# Patient Record
Sex: Female | Born: 1985 | Race: Asian | Hispanic: No | Marital: Single | Smoking: Never smoker
Health system: Southern US, Community
[De-identification: ages and names within clinical notes are randomized; demographics above are authoritative.]

## PROBLEM LIST (undated history)

## (undated) ENCOUNTER — Inpatient Hospital Stay (HOSPITAL_COMMUNITY): Payer: Self-pay

## (undated) ENCOUNTER — Inpatient Hospital Stay: Admission: EM | Payer: Self-pay | Source: Home / Self Care

## (undated) DIAGNOSIS — O0991 Supervision of high risk pregnancy, unspecified, first trimester: Secondary | ICD-10-CM

## (undated) DIAGNOSIS — A499 Bacterial infection, unspecified: Secondary | ICD-10-CM

## (undated) DIAGNOSIS — R0609 Other forms of dyspnea: Secondary | ICD-10-CM

## (undated) DIAGNOSIS — Z8619 Personal history of other infectious and parasitic diseases: Secondary | ICD-10-CM

## (undated) DIAGNOSIS — I340 Nonrheumatic mitral (valve) insufficiency: Secondary | ICD-10-CM

## (undated) DIAGNOSIS — I5020 Unspecified systolic (congestive) heart failure: Secondary | ICD-10-CM

## (undated) DIAGNOSIS — R5382 Chronic fatigue, unspecified: Secondary | ICD-10-CM

## (undated) DIAGNOSIS — R002 Palpitations: Secondary | ICD-10-CM

## (undated) DIAGNOSIS — I428 Other cardiomyopathies: Secondary | ICD-10-CM

## (undated) HISTORY — PX: WISDOM TOOTH EXTRACTION: SHX21

## (undated) HISTORY — DX: Personal history of other infectious and parasitic diseases: Z86.19

## (undated) HISTORY — DX: Unspecified systolic (congestive) heart failure: I50.20

## (undated) HISTORY — DX: Nonrheumatic mitral (valve) insufficiency: I34.0

## (undated) HISTORY — DX: Bacterial infection, unspecified: A49.9

## (undated) HISTORY — PX: DILATION AND CURETTAGE OF UTERUS: SHX78

---
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
## 2008-02-06 ENCOUNTER — Inpatient Hospital Stay (HOSPITAL_COMMUNITY): Admission: AD | Admit: 2008-02-06 | Discharge: 2008-02-06 | Payer: Self-pay | Admitting: Obstetrics and Gynecology

## 2008-02-10 ENCOUNTER — Encounter (INDEPENDENT_AMBULATORY_CARE_PROVIDER_SITE_OTHER): Payer: Self-pay | Admitting: Obstetrics and Gynecology

## 2008-02-10 ENCOUNTER — Inpatient Hospital Stay (HOSPITAL_COMMUNITY): Admission: AD | Admit: 2008-02-10 | Discharge: 2008-02-13 | Payer: Self-pay | Admitting: Obstetrics and Gynecology

## 2010-07-11 ENCOUNTER — Inpatient Hospital Stay (HOSPITAL_COMMUNITY)
Admission: AD | Admit: 2010-07-11 | Discharge: 2010-07-11 | Payer: Self-pay | Source: Home / Self Care | Attending: Obstetrics and Gynecology | Admitting: Obstetrics and Gynecology

## 2010-07-12 LAB — WET PREP, GENITAL

## 2010-07-12 LAB — CBC
Hemoglobin: 11.7 g/dL — ABNORMAL LOW (ref 12.0–15.0)
MCH: 25.3 pg — ABNORMAL LOW (ref 26.0–34.0)
MCV: 74.5 fL — ABNORMAL LOW (ref 78.0–100.0)
Platelets: 340 10*3/uL (ref 150–400)
RBC: 4.62 MIL/uL (ref 3.87–5.11)
RDW: 14.7 % (ref 11.5–15.5)

## 2010-07-13 ENCOUNTER — Ambulatory Visit (HOSPITAL_COMMUNITY)
Admission: RE | Admit: 2010-07-13 | Discharge: 2010-07-13 | Payer: Self-pay | Source: Home / Self Care | Attending: Obstetrics and Gynecology | Admitting: Obstetrics and Gynecology

## 2010-07-13 HISTORY — PX: DILATION AND EVACUATION: SHX1459

## 2010-07-13 LAB — CBC
MCH: 25.1 pg — ABNORMAL LOW (ref 26.0–34.0)
WBC: 5.9 10*3/uL (ref 4.0–10.5)

## 2010-07-21 NOTE — Op Note (Signed)
  NAME:  [name], EISENHOWER NO.:  [id number]  MEDICAL RECORD NO.:  [id number]          PATIENT TYPE:  AMB  LOCATION:  SDC                           [facility]  PHYSICIAN:  Hal Morales, M.D.DATE OF BIRTH:  26-Apr-1986  DATE OF PROCEDURE:  07/13/2010 DATE OF DISCHARGE:  07/13/2010                              OPERATIVE REPORT   PREOPERATIVE DIAGNOSIS:  Blighted ovum.  POSTOPERATIVE DIAGNOSIS:  Blighted ovum.  OPERATION:  Suction dilatation and evacuation.  SURGEON:  Hal Morales, MD  ANESTHESIA:  General mask.  ESTIMATED BLOOD LOSS:  Less than 10 mL.  COMPLICATIONS:  None.  FINDINGS:  The uterus was enlarged to approximately 8-week size and sounded to 8 cm.  There were a moderate amount of products of conception obtained at D and E.  PROCEDURE:  The patient was taken to the operating room after appropriate identification and placed on the operating table.  After the attainment of adequate anesthesia, she was placed in the lithotomy position.  Perineum and vagina were prepped with multiple layers of Betadine and draped as a sterile field.  The bladder was emptied with a red Robinson catheter.  The Graves speculum was placed in the vagina and a paracervical block achieved with a total of 10 mL of 2% Xylocaine in the 5 and 7 o'clock positions.  The cervix was grasped with a single- tooth tenaculum on the anterior lip and the uterus sounded.  The cervix was dilated to accommodate a #7 suction curette and that was used to suction evacuate all quadrants of the uterus.  A sharp curette was used to ensure that all products of conception had been removed.  The instruments were then removed from the vagina and the patient was given a single dose of Methergine 0.2 mg IV then Toradol 30 mg IV and 30 mg IM.  She was awakened from general anesthesia and taken to the recovery room in satisfactory condition having tolerated the procedure well with sponge and  instrument counts correct.  SPECIMENS TO PATHOLOGY:  Products of conception.  Blood type B positive.  DISCHARGE INSTRUCTIONS:  Printed instructions for D and E from the [facility].  DISCHARGE MEDICATIONS: 1. Ibuprofen 600 mg p.o. q.6 h. for the next 3 days and then p.r.n.     pain. 2. Doxycycline 100 mg p.o. b.i.d. for 7 days. 3. Methergine 0.2 mg p.o. q.6 h. for 2 days.  FOLLOWUP:  In 2 weeks.     Hal Morales, M.D.     VPH/MEDQ  D:  07/13/2010  T:  07/14/2010  Job:  [id number]  Electronically Signed by Dierdre Forth M.D. on 07/21/2010 11:39:30 AM

## 2010-07-21 NOTE — H&P (Signed)
  NAME:  ONI, DIETZMAN NO.:  [id number]  MEDICAL RECORD NO.:  [id number]          PATIENT TYPE:  AMB  LOCATION:  SDC                           [facility]  PHYSICIAN:  Hal Morales, M.D.DATE OF BIRTH:  18-Nov-1985  DATE OF ADMISSION:  07/13/2010 DATE OF DISCHARGE:                             HISTORY & PHYSICAL   The patient is a 25 year old gravida 2, para 1-0-0-1 at 10 weeks and 3 days by last menstrual period April 29, 2010 who was seen in maternity admissions on July 11, 2010 for spotting and mild cramping. She was diagnosed with a blighted ovum by ultrasound at that visit.  The ultrasound showed a gestational sac measuring 6 weeks and 5 days with no fetal pole, no yolk sac. The gestational sac was in the lower uterine segment.  She was informed that this was not a viable pregnancy and since she was in stable condition, she was given the options of expectant management versus Cytotec versus D and C and initially elected expectant management with plan to follow up in the office in a week. The following day she called and asked to proceed with a D&C which was scheduled with Dr. Pennie Rushing on July 13, 2010.  PAST MEDICAL HISTORY:  Positive for frequent UTIs and chickenpox as a child.  OBSTETRICAL HISTORY:  The patient had elective abortion in 2003.  She had a D&E with no complications.  In 2009, she had a primary low- transverse C-section for failure to progress.  She had elevated blood pressures in that pregnancy.  GYN HISTORY:  The patient started menstruating around 50-51 years of age.  She has regular cycles with no abnormalities.  The first day of her last menstrual period was April 29, 2010 giving her an estimated date of delivery of February 03, 2011.  SURGICAL HISTORY:  Positive for D&E in 2003 and low transverse primary C- section in 2009.  FAMILY HISTORY:  No significant family history.  GENETIC HISTORY:  Positive for Down  syndrome.  SOCIAL HISTORY:  The patient is a single [place] female.  She denies tobacco, alcohol, and drug use.  ALLERGIES:  No known drug allergies.  No allergy to latex or iodine.  MEDICATIONS:  None.  REVIEW OF SYSTEMS:  Negative except as stated in history of present illness.  ASSESSMENT:  Blighted ovum and desires surgical intervention.  PLAN:  D and E scheduled with Dierdre Forth on July 13, 2010.     Dorathy Kinsman, CNM   ______________________________ Hal Morales, M.D.    VS/MEDQ  D:  07/13/2010  T:  07/13/2010  Job:  [id number]  Electronically Signed by Dorathy Kinsman  on 07/16/2010 04:39:50 PM Electronically Signed by Dierdre Forth M.D. on 07/21/2010 11:39:25 AM

## 2010-10-30 ENCOUNTER — Emergency Department (HOSPITAL_BASED_OUTPATIENT_CLINIC_OR_DEPARTMENT_OTHER)
Admission: EM | Admit: 2010-10-30 | Discharge: 2010-10-30 | Disposition: A | Discharge status: Home / Self Care | Payer: Medicaid Other | Attending: Emergency Medicine | Admitting: Emergency Medicine

## 2010-10-30 DIAGNOSIS — M545 Low back pain, unspecified: Secondary | ICD-10-CM | POA: Insufficient documentation

## 2010-10-30 LAB — URINALYSIS, ROUTINE W REFLEX MICROSCOPIC
Ketones, ur: NEGATIVE mg/dL
Nitrite: NEGATIVE

## 2010-10-30 LAB — URINE MICROSCOPIC-ADD ON

## 2010-10-31 NOTE — Discharge Summary (Signed)
NAME:  [name], KARDELL NO.:  [id number]   MEDICAL RECORD NO.:  [id number]          PATIENT TYPE:  INP   LOCATION:  9108                          [facility]  [facility]   PHYSICIAN:  Hal Morales, M.D.DATE OF BIRTH:  [birth year]   DATE OF ADMISSION:  02/10/2008  DATE OF DISCHARGE:  02/13/2008                               DISCHARGE SUMMARY   DISCHARGING PHYSICIAN:  Dois Davenport A. Rivard, MD   ADMISSION DIAGNOSES:  1. Intrauterine pregnancy at 38-5/7 weeks.  2. Elevated blood pressures.  3. Early labor.  4. Spontaneous rupture of membranes.  5. Group B Streptococcus negative.   DISCHARGE DIAGNOSES:  1. Intrauterine pregnancy at 38-5/7 weeks.  2. Elevated blood pressures.  3. Early labor.  4. Spontaneous rupture of membranes.  5. Group B Streptococcus negative.  6. Failure to descend.  7. Pregnancy-induced hypertension.  8. Status post cesarean delivery of a viable female infant with Apgars      9 and 9 weighing 5 pounds 11 ounces.   HOSPITAL PROCEDURES:  1. Electronic fetal monitoring.  2. Epidural anesthesia.  3. Primary low-transverse cesarean delivery of a female infant, Apgars      9 and 9, weighing 5 pounds 11 ounces.   HOSPITAL COURSE:  The patient was admitted in early labor with  spontaneous rupture of membranes.  She was observed through the day and  gradually increased her labor and progressed to 8 cm rather quickly.  She quickly became completely dilated after that and began pushing.  She  pushed for a total of 4 hours with a 1-hour arrest in between.  The  fetal vertex failed to descend past the 0 station.  She was counseled by  Dr. Su Hilt that she would not be a candidate for vacuum-assisted  delivery, and recommendation was made for primary low-transverse  cesarean section, which was performed under epidural anesthesia for a  viable female infant, Apgars 9 and 9, weighing 5 pounds 11 ounces.  She  was taken to the AICU because of elevations in her  liver function tests.  The next morning, it was determined based on these significant  elevations that she was pre-eclamptic and was placed on magnesium  sulfate infusion.  On postop day #1, her blood pressures were 130s to  150s over 80s to 90s.  ALT was 115, which was elevated from 44.  LDH was  697.  She was given routine care as per magnesium sulfate protocol, and  on postop day #1, was running blood pressures in the 120s to 140s over  70s.  She continued to receive routine care, and liver enzymes were  rechecked and found to be decreased on postop day #2 from an AST of 337  down to 204, ALT decreased from 147 to 127, platelets remained stable at  254, blood pressures were in the 130s over 80s to low 90s, I&O was  equal, oxygen saturations were good.  She continued to receive routine  care, and magnesium sulfate was discontinued.  She was then transferred  to the Mother-Baby Unit, where she did well.  She continued to breast  pump and  feed the baby breast milk via bottle.  On postop day #3, she  was ready to go home.  She was up and around the room, not requiring  pain medications.  Breast pumping was going well.  Vital signs were  stable.  Blood pressures were 112 to 137 over 54 to 97.  Weight was  stable.  Chest was clear.  Heart rate regular rate and rhythm.  Abdomen  was soft and appropriately tender.  Incision was clean, dry, and intact.  Extremities showed trace to 1+ edema with 2+ DTRs and no clonus.  Platelet count was 255, AST was 132, which was decreased, and ALT was  stable 127.  Blood pressure just prior to discharge was 131/82.  She was  deemed to receive full benefit of her hospital stay and was discharged  home per Dr. Estanislado Pandy.   DISCHARGE MEDICATIONS:  1. Motrin 600 mg p.o. q.6 h. p.r.n.  2. Tylox 1-2 p.o. q.4 h. p.r.n.   DISCHARGE LABORATORIES:  Sodium 137, potassium 4.3, creatinine 0.46, AST  132, ALT 127, platelet count 255, hemoglobin 9.1.   DISCHARGE  INSTRUCTIONS:  Per CCP handout.   DISCHARGE FOLLOWUP:  Will include a Smart Start visit on Monday with a  visit at [facility] on February 20, 2008 at 9:30 a.m. for  blood pressure check.   CONDITION AT DISCHARGE:  Good.      Marie L. Mayford Knife, C.N.M.      ______________________________  Hal Morales, M.D.    MLW/MEDQ  D:  02/13/2008  T:  02/14/2008  Job:  [id number]

## 2010-10-31 NOTE — Op Note (Signed)
NAME:  [name], [name] NO.:  [id number]   MEDICAL RECORD NO.:  [id number]          PATIENT TYPE:  INP   LOCATION:  9168                          [facility]   PHYSICIAN:  Osborn Coho, M.D.   DATE OF BIRTH:  Aug 07, 1985   DATE OF PROCEDURE:  02/10/2008  DATE OF DISCHARGE:                               OPERATIVE REPORT   PREOPERATIVE DIAGNOSES:  1. Term intrauterine pregnancy.  2. Labor.  3. Failure to descend.   POSTOPERATIVE DIAGNOSES:  1. Term intrauterine pregnancy.  2. Labor.  3. Failure to descend.  4. Questionable pregnancy-induced hypertension.   PROCEDURE:  Primary low transverse C-section.   ANESTHESIA:  Epidural.   ATTENDING:  Kae Heller, MD   ASSISTANT:  1. Wynelle Bourgeois, CNM.  2. Candice Denny Levy, CNM.   FLUIDS:  2000 mL.   URINE OUTPUT:  125 mL.   ESTIMATED BLOOD LOSS:  600 mL.   COMPLICATIONS:  None.   FINDINGS:  Live female infant with Apgars of 9 at 1 minute and 9 at 5  minutes, weighing 5 pounds and 11 ounces.  Normal-appearing bilateral  ovaries and fallopian tubes.   PROCEDURE:  The patient was taken to the operating room after risks,  benefits, and alternatives discussed with the patient.  The patient  verbalized understanding, and consent signed and witness.  The patient  was given a surgical level via the epidural, and prepped and draped in  the normal sterile fashion in the supine position.  A Pfannenstiel skin  incision was made and carried down to the underlying layer of fascia  with the scalpel and Bovie.  The fascia was excised bilaterally in the  midline, and extended bilaterally with the Mayo scissors.  Kocher clamps  were placed on the inferior aspect of the fascial incision, and the  rectus muscle excised from the fascia.  The same was done on the  superior aspect of the fascial incision.   The muscle was separated in the midline, and the peritoneum entered  bluntly and extended manually.  The  bladder blade was placed, and  bladder flap created with the Metzenbaum scissors.  Bladder blade was  replaced, uterine incision made with a scalpel, and extended bilaterally  with the bandage scissors.  The infant was in the ROP presentation,  delivered without difficulty, and oropharynx was bulb suctioned.  The  cord was clamped and cut, and the infant handed to the waiting  pediatricians.  Cefoxitin 1 g had been administered at the start of the  case.  The placenta was removed via fundal massage, and uterus cleared  of all clots and debris.   The uterine incision was repaired with 0 Vicryl in a running-locked  fashion, and a second imbricating layer was performed.  Prior to closure  of the primary incision, there was a uterine extension of the incision  on the right aspect of the incision.  This was repaired with 0 Vicryl  via a running interlocking stitch, and a second layer was performed  using an imbricated stitch of 0 Vicryl as well.  The intra-abdominal  cavity was  copiously irrigated, and normal-appearing bilateral ovaries  and fallopian tubes were noted.  The uterine incisions were noted to be  hemostatic, and the peritoneum was repaired with 2-0 chromic in a  running fashion.  The fascia was repaired with 0 Vicryl in a running  fashion.  The subcutaneous tissue was irrigated and made hemostatic with  the Bovie.   Four interrupted stitches of 2-0 plain were placed in the subcutaneous  tissue, and the skin was reapproximated using 3-0 Monocryl via a  subcuticular stitch.  A 1/2-inch sterile Steri-Strips were applied with  Benzoin.  Sponge, lap, and needle count was correct.  The patient  tolerated procedure well, and is currently being transferred to the  recovery room in good condition.      Osborn Coho, M.D.  Electronically Signed     AR/MEDQ  D:  02/10/2008  T:  02/11/2008  Job:  [id number]

## 2010-10-31 NOTE — H&P (Signed)
NAME:  [name], VERDEJO NO.:  [id number]   MEDICAL RECORD NO.:  [id number]          PATIENT TYPE:  INP   LOCATION:  9163                          [facility]   PHYSICIAN:  Hal Morales, M.D.DATE OF BIRTH:  December 09, 1985   DATE OF ADMISSION:  02/10/2008  DATE OF DISCHARGE:                              HISTORY & PHYSICAL   HISTORY OF PRESENT ILLNESS:  The patient is a 25 year old single Asian  female gravida 2, para 0-0-1-0 at 38-5/7 weeks per an [facility] of February 19, 2008, who presents with chief complaint of spontaneous rupture of  membranes at 0250 a.m. in an early labor.  She denies headache, visual  disturbances, right upper quadrant or epigastric pain, UTI signs or  symptoms, nausea, vomiting, diarrhea, fever, shortness of breath, cough  or chills.  She reports contractions ensuing soon after rupture of  membrane.  She has been followed by the CNM service at [facility].  History is  unremarkable.  GBS is negative.  She reports good fetal movement.  Leakage of fluid has been clear.   PRENATAL LABORATORIES:  Patient's blood type is B positive, Rh antibody  screen negative, sickle cell negative.  RPR nonreactive, rubella titer  immune, hepatitis surface antigen negative, HIV nonreactive, cystic  fibrosis negative.  Pap smear on March 5 was normal.  Gonorrhea and  chlamydia cultures were negative.  As well, on that date, her hemoglobin  was 12.3, hematocrit 36.4, platelets 373.  GBS and GC/chlamydia cultures  at 35-3/7 weeks were all negative.   OBSTETRICAL HISTORY:  The patient had an elective abortion with  subsequent D&E around 2003.  Gravida 2 is current pregnancy.   ALLERGIES:  Denies medication of latex allergies or other sensitivities.   PAST MEDICAL HISTORY:  She reports menarche at age 8-16.  Monthly  cycle.  On abnormalities.  She reported an LMP of May 08, 2007,  which originally gave her an [facility] of February 12, 2008.  However, she had  early  ultrasound giving best EDC of February 19, 2008.  She reports use  of birth control pills around 2003.  She thinks that she did not have  chicken pox as a child, and otherwise, normal childhood illnesses.  Did  report a history of frequent UTI's, last one was in November 2008.   PAST SURGICAL HISTORY:  Unremarkable.   FAMILY HISTORY:  Unremarkable.   GENETIC HISTORY:  Father of the baby's older brother has Down syndrome.   SOCIAL HISTORY:  She is a single [place] female.  They do not report a  religious affiliation.  Father of the baby's name is Denton Brick.  He  works full time as a Radio broadcast assistant and is involved and supportive.  The  patient is unemployed and had been a full time Consulting civil engineer at [employer], but has  not attended since fall 2008.  She denied alcohol, tobacco or illicit  drug use.   HISTORY OF PRESENT PREGNANCY:  She entered care at [facility] for new OB  interview February 23.  She had questionable LMP, and was scheduled when  she returned for a dating  ultrasound.  She returned at approximately 14  weeks.  On March 5, had an ultrasound showing single intrauterine  pregnancy, size less than dates, giving best [facility] of February 19, 2008,  anterior placenta.  She did have a quad screen done at that time and it  was within normal limits.  She returned at approximately 18-4/7 weeks on  April 6 and had her anatomy ultrasound, size was equal to dates.  Cervical length 3.31 cm, anterior placenta, three vessel cord, normal  anatomy and growth and development.  At around 23-3/7 weeks, she was  complaining of a fine papular rash on her abdomen and was instructed to  try Claritin p.r.n. for several days and avoid any perfumed soaps and  lotions.  Has not had any problems with the rash since.  At 27-4/7  weeks, she had her one hour Glucola which was within normal limits,  equal to 62.  Hemoglobin at that time was 10.8.  Pregnancy continued to  progress without any complications until her presentation  today.  She  has had trace protein on urine on several occasions at the office as  well as most recently on labor check here.   OBJECTIVE:  VITAL SIGNS:  On admission, blood pressure 141/102, repeat  was 143/104, heart rate 99, respirations 20, temperature 97.7.  EFM  showing fetal heart rate 135.  She has had some 10/10 accelerations, but  not yet reactive.  Minimal to moderate variability.  No decelerations.  Toco showing uterine contractions every 3-5 minutes with interspersed  irritability.  Moderate to strong on palpation.  GENERAL:  She has a labored breathing with her contractions and grimace.  Otherwise, she is without pain.  She is alert and oriented x3 and very  pleasant.  HEENT:  Grossly intact and within normal limits.  CARDIOVASCULAR:  Regular rate and rhythm without murmur.  LUNGS:  Clear to auscultation bilaterally.  ABDOMEN:  Soft, nontender, gravid.  PELVIC:  She did have a clear glistening fluid on vulva and perineum.  __________was positive.  Cervical exam was 3.5, 100% and 0 station.  No  membranes were palpated.  EXTREMITIES:  Deep tendon reflexes were 1+.  She does not have any  clonus.  She has trace bilateral lower extremity edema.   IMPRESSION:  1. Intrauterine pregnancy at 38-5/7 weeks.  2. Elevated blood pressures .  3. Early labor with spontaneous rupture of membranes.  4. Group beta strep negative .  5. Undecided on pain intervention during labor.   PLAN:  1. Admit to birthing suite with Dr. Pennie Rushing as attending physician.  2. Routine L&D orders with [facility] labs with saline lock.  Will defer cath      UA at present.  3. Encouraged patient to receive Stadol or epidural  p.r.n. secondary      to blood pressure elevation.  4. Support p.r.n. while awaiting for the baby's arrival.  He had been      in [place].  5. Consult with MD p.r.n.      Candice Denny Levy, CNM      ______________________________  Hal Morales, M.D.    CHS/MEDQ   D:  02/10/2008  T:  02/10/2008  Job:  [id number]

## 2011-08-16 ENCOUNTER — Emergency Department (HOSPITAL_BASED_OUTPATIENT_CLINIC_OR_DEPARTMENT_OTHER)
Admission: EM | Admit: 2011-08-16 | Discharge: 2011-08-16 | Disposition: A | Discharge status: Home / Self Care | Payer: Self-pay | Attending: Emergency Medicine | Admitting: Emergency Medicine

## 2011-08-16 ENCOUNTER — Encounter (HOSPITAL_BASED_OUTPATIENT_CLINIC_OR_DEPARTMENT_OTHER): Payer: Self-pay

## 2011-08-16 DIAGNOSIS — B309 Viral conjunctivitis, unspecified: Secondary | ICD-10-CM | POA: Insufficient documentation

## 2011-08-16 DIAGNOSIS — H5710 Ocular pain, unspecified eye: Secondary | ICD-10-CM | POA: Insufficient documentation

## 2011-08-16 MED ORDER — TETRACAINE HCL 0.5 % OP SOLN
OPHTHALMIC | Status: AC
  Administered 2011-08-16: 16:00:00
  Filled 2011-08-16: qty 2

## 2011-08-16 MED ORDER — FLUORESCEIN SODIUM 1 MG OP STRP
ORAL_STRIP | OPHTHALMIC | Status: AC
  Administered 2011-08-16: 16:00:00
  Filled 2011-08-16: qty 1

## 2011-08-16 NOTE — Discharge Instructions (Signed)
Viral Conjunctivitis Conjunctivitis is an irritation (inflammation) of the clear membrane that covers the white part of the eye (the conjunctiva). The irritation can also happen on the underside of the eyelids. Conjunctivitis makes the eye red or pink in color. This is what is commonly known as pink eye. Viral conjunctivitis can spread easily (contagious). CAUSES   Infection from virus on the surface of the eye.   Infection from the irritation or injury of nearby tissues such as the eyelids or cornea.   More serious inflammation or infection on the inside of the eye.   Other eye diseases.   The use of certain eye medications.  SYMPTOMS  The normally white color of the eye or the underside of the eyelid is usually pink or red in color. The pink eye is usually associated with irritation, tearing and some sensitivity to light. Viral conjunctivitis is often associated with a clear, watery discharge. If a discharge is present, there may also be some blurred vision in the affected eye. DIAGNOSIS  Conjunctivitis is diagnosed by an eye exam. The eye specialist looks for changes in the surface tissues of the eye which take on changes characteristic of the specific types of conjunctivitis. A sample of any discharge may be collected on a Q-Tip (sterile swap). The sample will be sent to a lab to see whether or not the inflammation is caused by bacterial or viral infection. TREATMENT  Viral conjunctivitis will not respond to medicines that kill germs (antibiotics). Treatment is aimed at stopping a bacterial infection on top of the viral infection. The goal of treatment is to relieve symptoms (such as itching) with antihistamine drops or other eye medications.  HOME CARE INSTRUCTIONS   To ease discomfort, apply a cool, clean wash cloth to your eye for 10 to 20 minutes, 3 to 4 times a day.   Gently wipe away any drainage from the eye with a warm, wet washcloth or a cotton ball.   Wash your hands often with  soap and use paper towels to dry.   Do not share towels or washcloths. This may spread the infection.   Change or wash your pillowcase every day.   You should not use eye make-up until the infection is gone.   Stop using contacts lenses. Ask your eye professional how to sterilize or replace them before using again. This depends on the type of contact lenses used.   Do not touch the edge of the eyelid with the eye drop bottle or ointment tube when applying medications to the affected eye. This will stop you from spreading the infection to the other eye or to others.  SEEK IMMEDIATE MEDICAL CARE IF:   The infection has not improved within 3 days of beginning treatment.   A watery discharge from the eye develops.   Pain in the eye increases.   The redness is spreading.   Vision becomes blurred.   An oral temperature above 102 F (38.9 C) develops, or as your caregiver suggests.   Facial pain, redness or swelling develops.   Any problems that may be related to the prescribed medicine develop.  MAKE SURE YOU:   Understand these instructions.   Will watch your condition.   Will get help right away if you are not doing well or get worse.  Document Released: 06/04/2005 Document Revised: 02/14/2011 Document Reviewed: 01/22/2008 Exit[employer] Patient Information 2012 [employer]. 

## 2011-08-16 NOTE — ED Provider Notes (Signed)
History     CSN: [id number]  Arrival date & time 08/16/11  1545   First MD Initiated Contact with Patient 08/16/11 1604      Chief Complaint  Patient presents with  . Eye Pain    (Consider location/radiation/quality/duration/timing/severity/associated sxs/prior treatment) HPI Comments: Patient presents with mild left lower eyelid swelling since yesterday.  There is a mild amount of redness and patient notes some mild irritation but no significant discharge.  No significant changes in vision.  Patient does not wear contacts or glasses.  She has no known injuries to her eye.  Patient is a 26 y.o. female presenting with eye pain. The history is provided by the patient.  Eye Pain This is a new problem. The current episode started yesterday. The problem occurs constantly. The problem has not changed since onset.Pertinent negatives include no chest pain, no abdominal pain, no headaches and no shortness of breath. The symptoms are aggravated by nothing. The symptoms are relieved by nothing. She has tried nothing for the symptoms.    History reviewed. No pertinent past medical history.  Past Surgical History  Procedure Date  . Cesarean section     History reviewed. No pertinent family history.  History  Substance Use Topics  . Smoking status: Never Smoker   . Smokeless tobacco: Not on file  . Alcohol Use: Yes    OB History    Grav Para Term Preterm Abortions TAB SAB Ect Mult Living                  Review of Systems  Constitutional: Negative.  Negative for fever and chills.  HENT: Negative.   Eyes: Positive for pain and redness. Negative for discharge.  Respiratory: Negative.  Negative for cough and shortness of breath.   Cardiovascular: Negative.  Negative for chest pain.  Gastrointestinal: Negative.  Negative for nausea, vomiting, abdominal pain and diarrhea.  Genitourinary: Negative.  Negative for dysuria and vaginal discharge.  Musculoskeletal: Negative.  Negative for  back pain.  Skin: Negative.  Negative for color change and rash.  Neurological: Negative.  Negative for syncope and headaches.  Hematological: Negative.  Negative for adenopathy.  Psychiatric/Behavioral: Negative.  Negative for confusion.  All other systems reviewed and are negative.    Allergies  Review of patient's allergies indicates no known allergies.  Home Medications  No current outpatient prescriptions on file.  BP 126/62  Pulse 87  Temp(Src) 98.1 F (36.7 C) (Oral)  Resp 18  Ht 5\' 4"  (1.626 m)  Wt 112 lb (50.803 kg)  BMI 19.22 kg/m2  SpO2 98%  LMP 07/23/2011  Physical Exam  Nursing note and vitals reviewed. Constitutional: She is oriented to person, place, and time. She appears well-developed and well-nourished.  Non-toxic appearance. She does not have a sickly appearance.  HENT:  Head: Normocephalic and atraumatic.  Eyes: EOM and lids are normal. Pupils are equal, round, and reactive to light. Right eye exhibits no discharge. Left eye exhibits no discharge. No scleral icterus.       Pupils are equal round and reactive to light, extraocular eye movements are intact.  Patient does have mild swelling and erythema to left lower eyelid.  No discharge is present.  The patients I did have fluorescein stain placed and no corneal abrasions were noted.  Neck: Trachea normal and normal range of motion. Neck supple.  Cardiovascular: Normal rate.   Pulmonary/Chest: Effort normal.  Abdominal: Normal appearance. There is no CVA tenderness.  Musculoskeletal: Normal range of motion.  Neurological: She is alert and oriented to person, place, and time. She has normal strength.  Skin: Skin is warm, dry and intact. No rash noted.  Psychiatric: She has a normal mood and affect. Her behavior is normal. Judgment and thought content normal.    ED Course  Procedures (including critical care time)  Labs Reviewed - No data to display No results found.   No diagnosis found.    MDM    Patient with likely viral conjunctivitis based on her exam.  There's no signs of corneal abrasion under fluorescein staining.  Patient has no complaints of changes in vision.  She has been given instructions that if the redness is worsening her pain is worsening or she has changes in her vision she should seek reevaluation.        Nat Christen, MD 08/16/11 [phone]

## 2011-08-16 NOTE — ED Notes (Signed)
Pt c/o L eye pain onset last night.  Pt denies using any meds PTA for pain.

## 2012-02-25 ENCOUNTER — Telehealth: Payer: Self-pay | Admitting: Obstetrics and Gynecology

## 2012-02-25 ENCOUNTER — Ambulatory Visit (INDEPENDENT_AMBULATORY_CARE_PROVIDER_SITE_OTHER): Payer: Medicaid Other

## 2012-02-25 ENCOUNTER — Other Ambulatory Visit: Payer: Self-pay | Admitting: Obstetrics and Gynecology

## 2012-02-25 ENCOUNTER — Encounter: Payer: Self-pay | Admitting: Obstetrics and Gynecology

## 2012-02-25 ENCOUNTER — Ambulatory Visit (INDEPENDENT_AMBULATORY_CARE_PROVIDER_SITE_OTHER): Payer: Medicaid Other | Admitting: Obstetrics and Gynecology

## 2012-02-25 VITALS — BP 98/56 | Ht 64.0 in | Wt 112.0 lb

## 2012-02-25 DIAGNOSIS — O209 Hemorrhage in early pregnancy, unspecified: Secondary | ICD-10-CM

## 2012-02-25 DIAGNOSIS — N898 Other specified noninflammatory disorders of vagina: Secondary | ICD-10-CM

## 2012-02-25 DIAGNOSIS — Z331 Pregnant state, incidental: Secondary | ICD-10-CM

## 2012-02-25 LAB — POCT URINALYSIS DIPSTICK
Bilirubin, UA: NEGATIVE
Glucose, UA: NEGATIVE
Ketones, UA: NEGATIVE
Leukocytes, UA: NEGATIVE
Nitrite, UA: NEGATIVE
Protein, UA: NEGATIVE
Urobilinogen, UA: NEGATIVE

## 2012-02-25 LAB — US OB COMP LESS 14 WKS

## 2012-02-25 NOTE — Progress Notes (Signed)
Subjective:    [name] is a 26 y.o. female, G3P0011, who presents for vaginal bleeding. Pt states that she is [redacted] weeks pregnant and started having bleeding on Saturday. States it started as spotting and by the end of the day it was as heavy as a cycle. Has not had any bleeding since Saturday. States that she has had some cramping. Pt states that she did have intercourse on Friday before bleeding started. Also c/o clear d/c with odor.  Urine Glucose = Neg Urine Protein = Neg   The following portions of the patient's history were reviewed and updated as appropriate: allergies, current medications, past family history.  Review of Systems Pertinent items are noted in HPI.    Objective:    LMP 12/20/2011    Weight:  Wt Readings from Last 1 Encounters:  08/16/11 112 lb (50.803 kg)          BMI: There is no height or weight on file to calculate BMI.  General Appearance: Alert, appropriate appearance for age. No acute distress GYN exam: deferred  Ultrasound: SIUP  S=D  FHR= 162 bpm   No anomalies   Assessment:    1st trimester bleeding secondary to IC with reassuring ultrasound    Plan:    NOB interview 02/27/12     Silverio Lay, MD

## 2012-02-25 NOTE — Telephone Encounter (Signed)
TC to pt. LM to return call.  

## 2012-02-25 NOTE — Telephone Encounter (Signed)
TC from pt. States has appt today.

## 2012-02-25 NOTE — Telephone Encounter (Signed)
Message copied by Mason Jim on Mon Feb 25, 2012 11:22 AM ------      Message from: Rhona Leavens      Created: Mon Feb 25, 2012 11:06 AM      Regarding: Patient       Hi,      This patient needs to be seen today due to pos UPT and spotting over the wkend.  States LMP 12/20/11.  Blood type B pos per old recs.              Thanks,      [employer]

## 2012-03-04 ENCOUNTER — Encounter: Payer: Self-pay | Admitting: Obstetrics and Gynecology

## 2013-08-27 ENCOUNTER — Emergency Department (HOSPITAL_BASED_OUTPATIENT_CLINIC_OR_DEPARTMENT_OTHER)
Admission: EM | Admit: 2013-08-27 | Discharge: 2013-08-28 | Disposition: A | Discharge status: Home / Self Care | Payer: Medicaid Other | Attending: Emergency Medicine | Admitting: Emergency Medicine

## 2013-08-27 ENCOUNTER — Encounter (HOSPITAL_BASED_OUTPATIENT_CLINIC_OR_DEPARTMENT_OTHER): Payer: Self-pay | Admitting: Emergency Medicine

## 2013-08-27 DIAGNOSIS — R102 Pelvic and perineal pain: Secondary | ICD-10-CM

## 2013-08-27 DIAGNOSIS — K5900 Constipation, unspecified: Secondary | ICD-10-CM | POA: Insufficient documentation

## 2013-08-27 DIAGNOSIS — B9689 Other specified bacterial agents as the cause of diseases classified elsewhere: Secondary | ICD-10-CM | POA: Insufficient documentation

## 2013-08-27 DIAGNOSIS — Z3202 Encounter for pregnancy test, result negative: Secondary | ICD-10-CM | POA: Insufficient documentation

## 2013-08-27 DIAGNOSIS — N760 Acute vaginitis: Secondary | ICD-10-CM | POA: Insufficient documentation

## 2013-08-27 DIAGNOSIS — A499 Bacterial infection, unspecified: Secondary | ICD-10-CM | POA: Insufficient documentation

## 2013-08-27 LAB — URINALYSIS, ROUTINE W REFLEX MICROSCOPIC
BILIRUBIN URINE: NEGATIVE
Glucose, UA: NEGATIVE mg/dL
Hgb urine dipstick: NEGATIVE
Ketones, ur: NEGATIVE mg/dL
Leukocytes, UA: NEGATIVE
NITRITE: NEGATIVE
Protein, ur: NEGATIVE mg/dL
Specific Gravity, Urine: 1.019 (ref 1.005–1.030)
UROBILINOGEN UA: 0.2 mg/dL (ref 0.0–1.0)
pH: 6.5 (ref 5.0–8.0)

## 2013-08-27 LAB — PREGNANCY, URINE: Preg Test, Ur: NEGATIVE

## 2013-08-27 NOTE — ED Notes (Signed)
Lower abdominal pain and left quad pain x 2 days. Denies vaginal discharge, dysuria or constipation.

## 2013-08-27 NOTE — ED Notes (Signed)
Pt reports pain to left lower abdomen on tues and today it moved more to her suprapubic region, denies vaginal discharge or urinary symptoms, lmp 2/12

## 2013-08-28 ENCOUNTER — Ambulatory Visit (HOSPITAL_BASED_OUTPATIENT_CLINIC_OR_DEPARTMENT_OTHER)
Admission: RE | Admit: 2013-08-28 | Discharge: 2013-08-28 | Disposition: A | Discharge status: Home / Self Care | Payer: Medicaid Other | Source: Ambulatory Visit | Attending: Emergency Medicine | Admitting: Emergency Medicine

## 2013-08-28 ENCOUNTER — Other Ambulatory Visit (HOSPITAL_BASED_OUTPATIENT_CLINIC_OR_DEPARTMENT_OTHER): Payer: Self-pay | Admitting: Emergency Medicine

## 2013-08-28 ENCOUNTER — Telehealth (HOSPITAL_BASED_OUTPATIENT_CLINIC_OR_DEPARTMENT_OTHER): Payer: Self-pay

## 2013-08-28 DIAGNOSIS — R102 Pelvic and perineal pain: Secondary | ICD-10-CM

## 2013-08-28 DIAGNOSIS — R109 Unspecified abdominal pain: Secondary | ICD-10-CM | POA: Insufficient documentation

## 2013-08-28 LAB — WET PREP, GENITAL
Trich, Wet Prep: NONE SEEN
YEAST WET PREP: NONE SEEN

## 2013-08-28 LAB — GC/CHLAMYDIA PROBE AMP
CT PROBE, AMP APTIMA: NEGATIVE
GC PROBE AMP APTIMA: NEGATIVE

## 2013-08-28 MED ORDER — METRONIDAZOLE 500 MG PO TABS
500.0000 mg | ORAL_TABLET | Freq: Once | ORAL | Status: AC
  Administered 2013-08-28: 500 mg via ORAL
  Filled 2013-08-28: qty 1

## 2013-08-28 MED ORDER — METRONIDAZOLE 500 MG PO TABS: 500.0000 mg | ORAL_TABLET | Freq: Two times a day (BID) | ORAL | Status: DC

## 2013-08-28 NOTE — ED Notes (Signed)
ubag applied for urine collection

## 2013-08-28 NOTE — ED Provider Notes (Signed)
CSN: [id number]     Arrival date & time 08/27/13  2131 History  This chart was scribed for Cheryl Fines, MD by Elby Beck, ED Scribe. This patient was seen in room MH05/MH05 and the patient's care was started at 12:52 AM.   Chief Complaint  Patient presents with  . Abdominal Pain    The history is provided by the patient. No language interpreter was used.    HPI Comments: [name] is a 28 y.o. female who presents to the Emergency Department complaining of intermittent left-sided abdominal pain over the past few days. She states that the pain has been gradually worsening since onset, and has progressed to include her suprapubic abdomen as well. She reports associated mild constipation over the past couple of days. She denies dysuria, hematuria, vaginal discharge or bleeding, fever, chills, vomiting, diarrhea or any other symptoms.    Past Medical History  Diagnosis Date  . Bacterial infection   . History of chicken pox    Past Surgical History  Procedure Laterality Date  . Cesarean section    . Dilation and curettage of uterus    . Wisdom tooth extraction     No family history on file. History  Substance Use Topics  . Smoking status: Never Smoker   . Smokeless tobacco: Never Used  . Alcohol Use: Yes   OB History   Grav Para Term Preterm Abortions TAB SAB Ect Mult Living   3 0   1  1   1      Review of Systems A complete 10 system review of systems was obtained and all systems are negative except as noted in the HPI and PMH.   Allergies  Review of patient's allergies indicates no known allergies.  Home Medications  No current outpatient prescriptions on file.  Triage Vitals: BP 126/82  Pulse 91  Temp(Src) 98.1 F (36.7 C) (Oral)  Resp 20  Ht 5\' 4"  (1.626 m)  Wt 109 lb (49.442 kg)  BMI 18.70 kg/m2  SpO2 99%  LMP 07/30/2013  Breastfeeding? Unknown  Physical Exam  Nursing note and vitals reviewed. General: Well-developed, well-nourished female in no  acute distress; appearance consistent with age of record HENT: normocephalic; atraumatic Eyes: pupils equal, round and reactive to light; extraocular muscles intact Neck: supple Heart: regular rate and rhythm; no murmurs, rubs or gallops Lungs: clear to auscultation bilaterally Abdomen: Suprapubic tenderness; soft; nondistended; no masses or hepatosplenomegaly; bowel sounds present Extremities: No deformity; full range of motion; pulses normal Neurologic: Awake, alert and oriented; motor function intact in all extremities and symmetric; no facial droop Skin: Warm and dry Psychiatric: Normal mood and affect GU: No CVA tenderness; normal external genitalia; physiologic appearing vaginal discharge; no vaginal bleeding; no adnexal tenderness or mass palpated; equivocal cervical motion tenderness  ED Course  Procedures (including critical care time)  DIAGNOSTIC STUDIES: Oxygen Saturation is 99% on RA, normal by my interpretation.    COORDINATION OF CARE: 12:57 AM- Will perform a pelvic exam. Pt advised of plan for treatment and pt agrees.  MDM   Nursing notes and vitals signs, including pulse oximetry, reviewed.  Summary of this visit's results, reviewed by myself:  Labs:  Results for orders placed during the hospital encounter of 08/27/13 (from the past 24 hour(s))  URINALYSIS, ROUTINE W REFLEX MICROSCOPIC     Status: None   Collection Time    08/27/13  9:45 PM      Result Value Ref Range   Color, Urine YELLOW  YELLOW   APPearance CLEAR  CLEAR   Specific Gravity, Urine 1.019  1.005 - 1.030   pH 6.5  5.0 - 8.0   Glucose, UA NEGATIVE  NEGATIVE mg/dL   Hgb urine dipstick NEGATIVE  NEGATIVE   Bilirubin Urine NEGATIVE  NEGATIVE   Ketones, ur NEGATIVE  NEGATIVE mg/dL   Protein, ur NEGATIVE  NEGATIVE mg/dL   Urobilinogen, UA 0.2  0.0 - 1.0 mg/dL   Nitrite NEGATIVE  NEGATIVE   Leukocytes, UA NEGATIVE  NEGATIVE  PREGNANCY, URINE     Status: None   Collection Time    08/27/13  9:45  PM      Result Value Ref Range   Preg Test, Ur NEGATIVE  NEGATIVE  WET PREP, GENITAL     Status: Abnormal   Collection Time    08/28/13  1:00 AM      Result Value Ref Range   Yeast Wet Prep HPF POC NONE SEEN  NONE SEEN   Trich, Wet Prep NONE SEEN  NONE SEEN   Clue Cells Wet Prep HPF POC MODERATE (*) NONE SEEN   WBC, Wet Prep HPF POC FEW (*) NONE SEEN   1:39 AM We will have her return later today for pelvic ultrasound. In the meantime we'll treat her for bacterial vaginosis.  I personally performed the services described in this documentation, which was scribed in my presence.  The recorded information has been reviewed and considered.   Cheryl Fines, MD 08/28/13 [phone]

## 2014-04-19 ENCOUNTER — Encounter (HOSPITAL_BASED_OUTPATIENT_CLINIC_OR_DEPARTMENT_OTHER): Payer: Self-pay | Admitting: Emergency Medicine

## 2014-07-26 LAB — OB RESULTS CONSOLE ABO/RH: RH TYPE: POSITIVE

## 2014-07-26 LAB — OB RESULTS CONSOLE HIV ANTIBODY (ROUTINE TESTING): HIV: NONREACTIVE

## 2014-07-26 LAB — OB RESULTS CONSOLE ANTIBODY SCREEN: Antibody Screen: NEGATIVE

## 2014-07-26 LAB — OB RESULTS CONSOLE RPR: RPR: NONREACTIVE

## 2014-07-26 LAB — OB RESULTS CONSOLE RUBELLA ANTIBODY, IGM: RUBELLA: IMMUNE

## 2014-07-26 LAB — OB RESULTS CONSOLE GC/CHLAMYDIA
Chlamydia: NEGATIVE
Gonorrhea: NEGATIVE

## 2014-07-26 LAB — OB RESULTS CONSOLE HEPATITIS B SURFACE ANTIGEN: Hepatitis B Surface Ag: NEGATIVE

## 2014-08-18 ENCOUNTER — Other Ambulatory Visit: Payer: Self-pay | Admitting: Obstetrics and Gynecology

## 2014-08-22 ENCOUNTER — Encounter (HOSPITAL_COMMUNITY): Payer: Self-pay | Admitting: *Deleted

## 2014-08-22 ENCOUNTER — Inpatient Hospital Stay (HOSPITAL_COMMUNITY): Payer: Medicaid Other

## 2014-08-22 ENCOUNTER — Inpatient Hospital Stay (HOSPITAL_COMMUNITY)
Admission: AD | Admit: 2014-08-22 | Discharge: 2014-08-22 | Disposition: A | Discharge status: Home / Self Care | Payer: Medicaid Other | Source: Ambulatory Visit | Attending: Obstetrics and Gynecology | Admitting: Obstetrics and Gynecology

## 2014-08-22 DIAGNOSIS — D251 Intramural leiomyoma of uterus: Secondary | ICD-10-CM | POA: Diagnosis not present

## 2014-08-22 DIAGNOSIS — O209 Hemorrhage in early pregnancy, unspecified: Secondary | ICD-10-CM | POA: Diagnosis not present

## 2014-08-22 DIAGNOSIS — O3411 Maternal care for benign tumor of corpus uteri, first trimester: Secondary | ICD-10-CM | POA: Diagnosis not present

## 2014-08-22 DIAGNOSIS — Z3A12 12 weeks gestation of pregnancy: Secondary | ICD-10-CM | POA: Diagnosis not present

## 2014-08-22 NOTE — MAU Provider Note (Signed)
History    Vinie Charity is a 29 y.o. G3P1011 at 12.5wks who presents, after phone call, for bleeding.  Patient states that she noted blood in her underwear and continued to have constant trickle upon going to bathroom.  Patient denies cramping, back pain, or issues with bowel movements or urination.  Patient further denies recent sexual intercourse and history of STDs.  However, patient does report recent abnormal pap and had a colposcopy on Wednesday 08/18/14.    Patient Active Problem List   Diagnosis Date Noted  . Bacterial infection     Chief Complaint  Patient presents with  . Vaginal Bleeding   HPI  OB History    Gravida Para Term Preterm AB TAB SAB Ectopic Multiple Living   4 1   2  1   1       Past Medical History  Diagnosis Date  . Bacterial infection   . History of chicken pox     Past Surgical History  Procedure Laterality Date  . Cesarean section    . Dilation and curettage of uterus    . Wisdom tooth extraction      History reviewed. No pertinent family history.  History  Substance Use Topics  . Smoking status: Never Smoker   . Smokeless tobacco: Never Used  . Alcohol Use: Yes    Allergies: No Known Allergies  Prescriptions prior to admission  Medication Sig Dispense Refill Last Dose  . metroNIDAZOLE (FLAGYL) 500 MG tablet Take 1 tablet (500 mg total) by mouth 2 (two) times daily. One po bid x 7 days 14 tablet 0     ROS  See HPI Above Physical Exam   Blood pressure 132/89, pulse 80, temperature 98.2 F (36.8 C), temperature source Oral, resp. rate 18, height 5\' 4"  (1.626 m), weight 118 lb 12.8 oz (53.887 kg), last menstrual period 07/30/2013, unknown if currently breastfeeding.  No results found for this or any previous visit (from the past 24 hour(s)).  Physical Exam  Constitutional: She is oriented to person, place, and time. She appears well-developed and well-nourished.  HENT:  Head: Normocephalic and atraumatic.  Eyes: EOM are normal.   Neck: Normal range of motion.  Cardiovascular: Normal rate, regular rhythm and normal heart sounds.   Respiratory: Effort normal and breath sounds normal.  GI: Soft. Bowel sounds are normal. She exhibits no distension.  Genitourinary: Uterus is enlarged (~12-14weeks in size). Cervix exhibits discharge (Yellowish-brown mucoid discharge from os). Cervix exhibits no motion tenderness and no friability. There is bleeding in the vagina.  Sterile Speculum Exam: -Vaginal Vault: Blood noted in vault.  No lesions. -Cervix: Yellowish brown mucoid discharge from os-GC/CT collected.  Os appears open.  Condyloma-like cluster noted from 10 to 12 O'clock as well as 4 to 6O'clock.   Bimanual Exam: Internal os Closed/Thick/Ballotable  Musculoskeletal: Normal range of motion.  Neurological: She is alert and oriented to person, place, and time.  Skin: Skin is warm and dry.  Psychiatric: She has a normal mood and affect.   NIO:270 by US  FINDINGS: Intrauterine gestational sac: Visualized/normal in shape.  Yolk sac: Yes Embryo: Yes Cardiac Activity: Yes  Heart Rate: 158 bpm  CRL: 5.5 cm 12 w 1 d [place] EDC: 03/05/2015  Maternal uterus/adnexae: No subchorionic hemorrhage is noted. The uterus contains a single 2.6 x 2.2 x 2.1 cm fibroid; this appears intramural in nature.  The right ovary measures 3.4 x 1.8 x 2.4 cm, while the left ovary measures 3.0 x 1.7 x  1.7 cm. No suspicious adnexal masses are seen; there is no evidence for ovarian torsion.  No free fluid is seen within the pelvic cul-de-sac.  IMPRESSION: 1. Single live intrauterine pregnancy noted, with a crown-rump length of 5.5 cm, corresponding to a gestational age of [redacted] weeks 1 day. This matches the gestational age of [redacted] weeks 4 days by LMP, reflecting an estimated date of delivery of March 02, 2015. 2. Small 2.6 cm uterine fibroid is likely intramural in nature. ED Course  Assessment: IUP at 12.5wks Vaginal  Bleeding S/P Colposcopy  Plan: -PE as above -Gc/CT -[place]  Follow Up (0233) -[place] Preliminary: No [place], No Free Fluid, Intramural Fibroid -Results Discussed--Questions and concerns addressed -Bleeding Precautions  -Pelvic Rest until next appt -Keep appt as scheduled: 3/22  -Encouraged to call if any questions or concerns arise prior to next scheduled office visit.  -Discharged to home in stable condition  Donnesha Karg LYNN CNM, MSN 08/22/2014 1:50 AM

## 2014-08-22 NOTE — Discharge Instructions (Signed)
Vaginal Bleeding During Pregnancy, First Trimester °A small amount of bleeding (spotting) from the vagina is relatively common in early pregnancy. It usually stops on its own. Various things may cause bleeding or spotting in early pregnancy. Some bleeding may be related to the pregnancy, and some may not. In most cases, the bleeding is normal and is not a problem. However, bleeding can also be a sign of something serious. Be sure to tell your health care provider about any vaginal bleeding right away. °Some possible causes of vaginal bleeding during the first trimester include: °· Infection or inflammation of the cervix. °· Growths (polyps) on the cervix. °· Miscarriage or threatened miscarriage. °· Pregnancy tissue has developed outside of the uterus and in a fallopian tube (tubal pregnancy). °· Tiny cysts have developed in the uterus instead of pregnancy tissue (molar pregnancy). °HOME CARE INSTRUCTIONS  °Watch your condition for any changes. The following actions may help to lessen any discomfort you are feeling: °· Follow your health care provider's instructions for limiting your activity. If your health care provider orders bed rest, you may need to stay in bed and only get up to use the bathroom. However, your health care provider may allow you to continue light activity. °· If needed, make plans for someone to help with your regular activities and responsibilities while you are on bed rest. °· Keep track of the number of pads you use each day, how often you change pads, and how soaked (saturated) they are. Write this down. °· Do not use tampons. Do not douche. °· Do not have sexual intercourse or orgasms until approved by your health care provider. °· If you pass any tissue from your vagina, save the tissue so you can show it to your health care provider. °· Only take over-the-counter or prescription medicines as directed by your health care provider. °· Do not take aspirin because it can make you  bleed. °· Keep all follow-up appointments as directed by your health care provider. °SEEK MEDICAL CARE IF: °· You have any vaginal bleeding during any part of your pregnancy. °· You have cramps or labor pains. °· You have a fever, not controlled by medicine. °SEEK IMMEDIATE MEDICAL CARE IF:  °· You have severe cramps in your back or belly (abdomen). °· You pass large clots or tissue from your vagina. °· Your bleeding increases. °· You feel light-headed or weak, or you have fainting episodes. °· You have chills. °· You are leaking fluid or have a gush of fluid from your vagina. °· You pass out while having a bowel movement. °MAKE SURE YOU: °· Understand these instructions. °· Will watch your condition. °· Will get help right away if you are not doing well or get worse. °Document Released: 03/14/2005 Document Revised: 06/09/2013 Document Reviewed: 02/09/2013 °ExitCare® Patient Information ©2015 [employer]. This information is not intended to replace advice given to you by your health care provider. Make sure you discuss any questions you have with your health care provider. ° °Pelvic Rest °Pelvic rest is sometimes recommended for women when:  °· The placenta is partially or completely covering the opening of the cervix (placenta previa). °· There is bleeding between the uterine wall and the amniotic sac in the first trimester (subchorionic hemorrhage). °· The cervix begins to open without labor starting (incompetent cervix, cervical insufficiency). °· The labor is too early (preterm labor). °HOME CARE INSTRUCTIONS °· Do not have sexual intercourse, stimulation, or an orgasm. °· Do not use tampons, douche, or   put anything in the vagina. °· Do not lift anything over 10 pounds (4.5 kg). °· Avoid strenuous activity or straining your pelvic muscles. °SEEK MEDICAL CARE IF:  °· You have any vaginal bleeding during pregnancy. Treat this as a potential emergency. °· You have cramping pain felt low in the stomach (stronger  than menstrual cramps). °· You notice vaginal discharge (watery, mucus, or bloody). °· You have a low, dull backache. °· There are regular contractions or uterine tightening. °SEEK IMMEDIATE MEDICAL CARE IF: °You have vaginal bleeding and have placenta previa.  °Document Released: 09/29/2010 Document Revised: 08/27/2011 Document Reviewed: 09/29/2010 °ExitCare® Patient Information ©2015 [employer]. This information is not intended to replace advice given to you by your health care provider. Make sure you discuss any questions you have with your health care provider. ° ° °

## 2014-08-22 NOTE — MAU Note (Signed)
Pt reports she noticed blood in her underwear and when she went to the BR about 30 in ago.Reprots slight cramping as well.

## 2014-08-23 LAB — GC/CHLAMYDIA PROBE AMP (~~LOC~~) NOT AT ARMC
Chlamydia: NEGATIVE
Neisseria Gonorrhea: NEGATIVE

## 2014-08-25 ENCOUNTER — Other Ambulatory Visit: Payer: Self-pay | Admitting: Obstetrics & Gynecology

## 2014-12-17 ENCOUNTER — Encounter (HOSPITAL_COMMUNITY): Payer: Self-pay | Admitting: *Deleted

## 2014-12-17 ENCOUNTER — Inpatient Hospital Stay (HOSPITAL_COMMUNITY): Payer: Medicaid Other

## 2014-12-17 ENCOUNTER — Inpatient Hospital Stay (HOSPITAL_COMMUNITY)
Admission: AD | Admit: 2014-12-17 | Discharge: 2014-12-17 | Disposition: A | Discharge status: Home / Self Care | Payer: Medicaid Other | Source: Ambulatory Visit | Attending: Obstetrics & Gynecology | Admitting: Obstetrics & Gynecology

## 2014-12-17 DIAGNOSIS — O4690 Antepartum hemorrhage, unspecified, unspecified trimester: Secondary | ICD-10-CM | POA: Diagnosis present

## 2014-12-17 DIAGNOSIS — Z3A29 29 weeks gestation of pregnancy: Secondary | ICD-10-CM | POA: Diagnosis not present

## 2014-12-17 DIAGNOSIS — O98313 Other infections with a predominantly sexual mode of transmission complicating pregnancy, third trimester: Secondary | ICD-10-CM | POA: Diagnosis not present

## 2014-12-17 DIAGNOSIS — O3421 Maternal care for scar from previous cesarean delivery: Secondary | ICD-10-CM | POA: Insufficient documentation

## 2014-12-17 DIAGNOSIS — Z98891 History of uterine scar from previous surgery: Secondary | ICD-10-CM

## 2014-12-17 DIAGNOSIS — A630 Anogenital (venereal) warts: Secondary | ICD-10-CM | POA: Diagnosis not present

## 2014-12-17 DIAGNOSIS — O26853 Spotting complicating pregnancy, third trimester: Secondary | ICD-10-CM | POA: Diagnosis present

## 2014-12-17 DIAGNOSIS — IMO0002 Reserved for concepts with insufficient information to code with codable children: Secondary | ICD-10-CM | POA: Diagnosis not present

## 2014-12-17 LAB — OB RESULTS CONSOLE GBS: STREP GROUP B AG: POSITIVE

## 2014-12-17 MED ORDER — BETAMETHASONE SOD PHOS & ACET 6 (3-3) MG/ML IJ SUSP
12.0000 mg | INTRAMUSCULAR | Status: DC
  Filled 2014-12-17: qty 2

## 2014-12-17 MED ORDER — NIFEDIPINE 10 MG PO CAPS
10.0000 mg | ORAL_CAPSULE | ORAL | Status: DC | PRN
  Administered 2014-12-17 (×3): 10 mg via ORAL
  Filled 2014-12-17 (×3): qty 1

## 2014-12-17 NOTE — Plan of Care (Signed)
Urine sent to lab 

## 2014-12-17 NOTE — MAU Provider Note (Signed)
  History   29 yo G3P1011 at 87 3/7 weeks presented after calling with spotting after IC.  Reports cramping has been frequent in pregnancy, "no worse" than previously. Reports increased d/c over last few days.    Patient Active Problem List   Diagnosis Date Noted  . ASCUS favor benign 12/18/2014  . 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 Previous cesarean section--2009, CPD, mild PIH 12/18/2014  . Premature labor   . Vaginal bleeding in pregnancy     Chief Complaint  Patient presents with  . Vaginal Bleeding  . Abdominal Pain   HPI:  As above  OB History    Gravida Para Term Preterm AB TAB SAB Ectopic Multiple Living   3 1 1  1  1   1       Past Medical History  Diagnosis Date  . Bacterial infection   . History of chicken pox     Past Surgical History  Procedure Laterality Date  . Cesarean section    . Dilation and curettage of uterus    . Wisdom tooth extraction      History reviewed. No pertinent family history.  History  Substance Use Topics  . Smoking status: Never Smoker   . Smokeless tobacco: Never Used  . Alcohol Use: Yes    Allergies: No Known Allergies  No prescriptions prior to admission    ROS:  Spotting, vaginal d/c, +FM, mild cramping Physical Exam   Blood pressure 116/76, pulse 107, temperature 98 F (36.7 C), temperature source Oral, resp. rate 18, last menstrual period 05/26/2014, unknown if currently breastfeeding.    Physical Exam In NAD Chest clear Heart RRR without murmur Abd gravid, NT Pelvic--cervix closed/FT, 50%, ? Breech, -1--on recheck by RN FT-1cm, thick, PP high.  Small amount pink spotting in vault, appears to be coming from external surface of cervix Ext WNL  FHR Category 1 UCs mild, q 2-3 min, some more consistent with irritability  Received 3 doses Procardia in MAU--patient remained unaware of most cramping, not in any discomfort.  UA negative  Fern negative Wet prep negative  GBS and cultures pending.   ED  Course  Assessment: IUP at 29 2/7 weeks PTL  Plan: Recommended betamethasone and further observation/treatment for PTL. After much discussion, patient declines betamethasone and any further treatment for PTL. "Want to let nature take its course"--I questioned if the patient understands she is putting the baby at risk of preterm delivery and respiratory distress which could be diminished by administration of betamethasone.  She advises she understands the possible implications of her decision. Cervix FT/1 on recheck--patient still declines admission, further PTL treatment, and betamethasone. No further bleeding--will maintain pelvic rest. Dr. Alesia Richards informed of patient decision prior to recheck. Patient will maintain limited activity at home, with s/s of advancing preterm labor reviewed.  Pelvic rest again reiterated. Will f/u at visit scheduled 12/21/14.  Per Dr. Fonnie Jarvis guidance, will likely just evaluate cervix by digital exam, since [place] measurements of cervical length may be less accurate after 29 weeks.   Donnel Saxon CNM, MSN 12/17/14 6p

## 2014-12-17 NOTE — MAU Note (Signed)
Brownish spotting, cramping in lower abd- "the usual"

## 2014-12-17 NOTE — MAU Note (Signed)
Betamethasone injection explained to patient & her husband, benefits to baby explained.  Pt wants to wait on injection for now, wants to think about this decision.

## 2014-12-17 NOTE — Discharge Instructions (Signed)

## 2014-12-18 ENCOUNTER — Encounter (HOSPITAL_COMMUNITY): Payer: Self-pay | Admitting: Obstetrics and Gynecology

## 2014-12-18 DIAGNOSIS — A630 Anogenital (venereal) warts: Secondary | ICD-10-CM | POA: Diagnosis not present

## 2014-12-18 DIAGNOSIS — IMO0002 Reserved for concepts with insufficient information to code with codable children: Secondary | ICD-10-CM | POA: Diagnosis not present

## 2014-12-18 DIAGNOSIS — Z98891 History of uterine scar from previous surgery: Secondary | ICD-10-CM

## 2014-12-19 LAB — CULTURE, BETA STREP (GROUP B ONLY)

## 2014-12-21 LAB — GC/CHLAMYDIA PROBE AMP (~~LOC~~) NOT AT ARMC
Chlamydia: NEGATIVE
NEISSERIA GONORRHEA: NEGATIVE

## 2015-02-02 ENCOUNTER — Encounter (HOSPITAL_COMMUNITY)
Admission: AD | Disposition: A | Discharge status: Home / Self Care | Payer: Self-pay | Source: Ambulatory Visit | Attending: Obstetrics and Gynecology

## 2015-02-02 ENCOUNTER — Encounter (HOSPITAL_COMMUNITY): Payer: Self-pay | Admitting: *Deleted

## 2015-02-02 ENCOUNTER — Inpatient Hospital Stay (HOSPITAL_COMMUNITY): Payer: Medicaid Other | Admitting: Anesthesiology

## 2015-02-02 ENCOUNTER — Inpatient Hospital Stay (HOSPITAL_COMMUNITY)
Admission: AD | Admit: 2015-02-02 | Discharge: 2015-02-05 | DRG: 765 | Disposition: A | Discharge status: Home / Self Care | Payer: Medicaid Other | Source: Ambulatory Visit | Attending: Obstetrics and Gynecology | Admitting: Obstetrics and Gynecology

## 2015-02-02 DIAGNOSIS — A630 Anogenital (venereal) warts: Secondary | ICD-10-CM | POA: Diagnosis present

## 2015-02-02 DIAGNOSIS — D649 Anemia, unspecified: Secondary | ICD-10-CM | POA: Diagnosis present

## 2015-02-02 DIAGNOSIS — O9081 Anemia of the puerperium: Secondary | ICD-10-CM | POA: Diagnosis present

## 2015-02-02 DIAGNOSIS — K660 Peritoneal adhesions (postprocedural) (postinfection): Secondary | ICD-10-CM | POA: Diagnosis present

## 2015-02-02 DIAGNOSIS — O99824 Streptococcus B carrier state complicating childbirth: Secondary | ICD-10-CM | POA: Diagnosis present

## 2015-02-02 DIAGNOSIS — O3421 Maternal care for scar from previous cesarean delivery: Principal | ICD-10-CM | POA: Diagnosis present

## 2015-02-02 DIAGNOSIS — O9832 Other infections with a predominantly sexual mode of transmission complicating childbirth: Secondary | ICD-10-CM | POA: Diagnosis present

## 2015-02-02 DIAGNOSIS — Z3A36 36 weeks gestation of pregnancy: Secondary | ICD-10-CM | POA: Diagnosis present

## 2015-02-02 DIAGNOSIS — N9981 Other intraoperative complications of genitourinary system: Secondary | ICD-10-CM | POA: Diagnosis not present

## 2015-02-02 DIAGNOSIS — O9962 Diseases of the digestive system complicating childbirth: Secondary | ICD-10-CM | POA: Diagnosis present

## 2015-02-02 DIAGNOSIS — Z98891 History of uterine scar from previous surgery: Secondary | ICD-10-CM

## 2015-02-02 LAB — CBC
HCT: 35.7 % — ABNORMAL LOW (ref 36.0–46.0)
HEMOGLOBIN: 12.3 g/dL (ref 12.0–15.0)
MCH: 27 pg (ref 26.0–34.0)
MCHC: 34.5 g/dL (ref 30.0–36.0)
MCV: 78.3 fL (ref 78.0–100.0)
PLATELETS: 317 10*3/uL (ref 150–400)
RBC: 4.56 MIL/uL (ref 3.87–5.11)
RDW: 14.9 % (ref 11.5–15.5)
WBC: 15.1 10*3/uL — AB (ref 4.0–10.5)

## 2015-02-02 LAB — TYPE AND SCREEN
ABO/RH(D): B POS
ANTIBODY SCREEN: NEGATIVE

## 2015-02-02 SURGERY — Surgical Case
Anesthesia: Spinal

## 2015-02-02 MED ORDER — LIDOCAINE-EPINEPHRINE (PF) 2 %-1:200000 IJ SOLN
INTRAMUSCULAR | Status: AC
  Filled 2015-02-02: qty 20

## 2015-02-02 MED ORDER — MIDAZOLAM HCL 2 MG/2ML IJ SOLN
INTRAMUSCULAR | Status: AC
  Filled 2015-02-02: qty 4

## 2015-02-02 MED ORDER — ONDANSETRON HCL 4 MG/2ML IJ SOLN: 4.0000 mg | Freq: Four times a day (QID) | INTRAMUSCULAR | Status: DC | PRN

## 2015-02-02 MED ORDER — SUCCINYLCHOLINE CHLORIDE 20 MG/ML IJ SOLN
INTRAMUSCULAR | Status: AC
  Filled 2015-02-02: qty 1

## 2015-02-02 MED ORDER — LIDOCAINE HCL (PF) 1 % IJ SOLN
INTRAMUSCULAR | Status: AC
  Filled 2015-02-02: qty 30

## 2015-02-02 MED ORDER — DEXAMETHASONE SODIUM PHOSPHATE 10 MG/ML IJ SOLN
INTRAMUSCULAR | Status: AC
  Filled 2015-02-02: qty 1

## 2015-02-02 MED ORDER — FENTANYL CITRATE (PF) 100 MCG/2ML IJ SOLN: 25.0000 ug | INTRAMUSCULAR | Status: DC | PRN

## 2015-02-02 MED ORDER — PHENYLEPHRINE HCL 10 MG/ML IJ SOLN
INTRAMUSCULAR | Status: DC | PRN
  Administered 2015-02-02 (×2): 80 ug via INTRAVENOUS

## 2015-02-02 MED ORDER — HYDROMORPHONE HCL 1 MG/ML IJ SOLN
INTRAMUSCULAR | Status: DC | PRN
Start: 2015-02-02 — End: 2015-02-02
  Administered 2015-02-02 (×2): 0.5 mg via INTRAVENOUS

## 2015-02-02 MED ORDER — SCOPOLAMINE 1 MG/3DAYS TD PT72
MEDICATED_PATCH | TRANSDERMAL | Status: AC
  Filled 2015-02-02: qty 1

## 2015-02-02 MED ORDER — LACTATED RINGERS IV SOLN
INTRAVENOUS | Status: DC | PRN
  Administered 2015-02-02 (×4): via INTRAVENOUS

## 2015-02-02 MED ORDER — CHLOROPROCAINE HCL 3 % IJ SOLN
INTRAMUSCULAR | Status: DC | PRN
  Administered 2015-02-02: 20 mL

## 2015-02-02 MED ORDER — DIPHENHYDRAMINE HCL 50 MG/ML IJ SOLN
INTRAMUSCULAR | Status: DC | PRN
  Administered 2015-02-02: 25 mg via INTRAVENOUS

## 2015-02-02 MED ORDER — OXYTOCIN 40 UNITS IN LACTATED RINGERS INFUSION - SIMPLE MED
INTRAVENOUS | Status: AC
  Administered 2015-02-02: 2 m[IU]/min via INTRAVENOUS
  Filled 2015-02-02: qty 1000

## 2015-02-02 MED ORDER — MEPERIDINE HCL 25 MG/ML IJ SOLN: 6.2500 mg | INTRAMUSCULAR | Status: DC | PRN

## 2015-02-02 MED ORDER — TERBUTALINE SULFATE 1 MG/ML IJ SOLN: 0.2500 mg | Freq: Once | INTRAMUSCULAR | Status: DC | PRN

## 2015-02-02 MED ORDER — DEXAMETHASONE SODIUM PHOSPHATE 10 MG/ML IJ SOLN
INTRAMUSCULAR | Status: DC | PRN
  Administered 2015-02-02: 10 mg via INTRAVENOUS

## 2015-02-02 MED ORDER — CEFAZOLIN SODIUM-DEXTROSE 2-3 GM-% IV SOLR
INTRAVENOUS | Status: AC
  Filled 2015-02-02: qty 50

## 2015-02-02 MED ORDER — CHLOROPROCAINE HCL 3 % IJ SOLN
INTRAMUSCULAR | Status: AC
  Filled 2015-02-02: qty 20

## 2015-02-02 MED ORDER — BUPIVACAINE IN DEXTROSE 0.75-8.25 % IT SOLN
INTRATHECAL | Status: DC | PRN
Start: 2015-02-02 — End: 2015-02-02
  Administered 2015-02-02: 12 mg via INTRATHECAL

## 2015-02-02 MED ORDER — OXYTOCIN 10 UNIT/ML IJ SOLN
INTRAMUSCULAR | Status: AC
  Filled 2015-02-02: qty 4

## 2015-02-02 MED ORDER — LACTATED RINGERS IV SOLN
INTRAVENOUS | Status: DC
  Administered 2015-02-02: 19:00:00 via INTRAVENOUS

## 2015-02-02 MED ORDER — MORPHINE SULFATE 0.5 MG/ML IJ SOLN
INTRAMUSCULAR | Status: AC
  Filled 2015-02-02: qty 100

## 2015-02-02 MED ORDER — KETOROLAC TROMETHAMINE 30 MG/ML IJ SOLN: 30.0000 mg | Freq: Four times a day (QID) | INTRAMUSCULAR | Status: AC | PRN

## 2015-02-02 MED ORDER — ACETAMINOPHEN 325 MG PO TABS: 650.0000 mg | ORAL_TABLET | ORAL | Status: DC | PRN

## 2015-02-02 MED ORDER — SODIUM CHLORIDE 0.9 % IV SOLN
2.0000 g | Freq: Once | INTRAVENOUS | Status: AC
  Administered 2015-02-02: 2 g via INTRAVENOUS
  Filled 2015-02-02: qty 2000

## 2015-02-02 MED ORDER — CITRIC ACID-SODIUM CITRATE 334-500 MG/5ML PO SOLN
ORAL | Status: AC
Start: 2015-02-02 — End: 2015-02-03
  Filled 2015-02-02: qty 15

## 2015-02-02 MED ORDER — LACTATED RINGERS IV SOLN: 500.0000 mL | INTRAVENOUS | Status: DC | PRN

## 2015-02-02 MED ORDER — LIDOCAINE HCL (PF) 1 % IJ SOLN: 30.0000 mL | INTRAMUSCULAR | Status: DC | PRN

## 2015-02-02 MED ORDER — ONDANSETRON HCL 4 MG/2ML IJ SOLN
INTRAMUSCULAR | Status: AC
  Filled 2015-02-02: qty 2

## 2015-02-02 MED ORDER — SUCCINYLCHOLINE CHLORIDE 20 MG/ML IJ SOLN
INTRAMUSCULAR | Status: DC | PRN
  Administered 2015-02-02: 40 mg via INTRAVENOUS

## 2015-02-02 MED ORDER — SCOPOLAMINE 1 MG/3DAYS TD PT72
MEDICATED_PATCH | TRANSDERMAL | Status: DC | PRN
  Administered 2015-02-02: 1 via TRANSDERMAL

## 2015-02-02 MED ORDER — CITRIC ACID-SODIUM CITRATE 334-500 MG/5ML PO SOLN
30.0000 mL | ORAL | Status: DC | PRN
  Administered 2015-02-02: 30 mL via ORAL

## 2015-02-02 MED ORDER — MORPHINE SULFATE (PF) 0.5 MG/ML IJ SOLN
INTRAMUSCULAR | Status: DC | PRN
  Administered 2015-02-02: .2 mg via INTRATHECAL

## 2015-02-02 MED ORDER — DIPHENHYDRAMINE HCL 50 MG/ML IJ SOLN
INTRAMUSCULAR | Status: AC
Start: 2015-02-02 — End: 2015-02-02
  Filled 2015-02-02: qty 1

## 2015-02-02 MED ORDER — LACTATED RINGERS IV SOLN
INTRAVENOUS | Status: DC | PRN
  Administered 2015-02-02: 21:00:00 via INTRAVENOUS

## 2015-02-02 MED ORDER — PROPOFOL 10 MG/ML IV BOLUS
INTRAVENOUS | Status: DC | PRN
  Administered 2015-02-02: 160 mg via INTRAVENOUS

## 2015-02-02 MED ORDER — ONDANSETRON HCL 4 MG/2ML IJ SOLN
INTRAMUSCULAR | Status: DC | PRN
  Administered 2015-02-02: 4 mg via INTRAVENOUS

## 2015-02-02 MED ORDER — HYDROMORPHONE HCL 1 MG/ML IJ SOLN
INTRAMUSCULAR | Status: AC
  Filled 2015-02-02: qty 1

## 2015-02-02 MED ORDER — SODIUM BICARBONATE 8.4 % IV SOLN
INTRAVENOUS | Status: AC
  Filled 2015-02-02: qty 50

## 2015-02-02 MED ORDER — CEFAZOLIN SODIUM-DEXTROSE 2-3 GM-% IV SOLR
INTRAVENOUS | Status: DC | PRN
  Administered 2015-02-02: 2 g via INTRAVENOUS

## 2015-02-02 MED ORDER — OXYTOCIN 40 UNITS IN LACTATED RINGERS INFUSION - SIMPLE MED
1.0000 m[IU]/min | INTRAVENOUS | Status: DC
  Administered 2015-02-02: 2 m[IU]/min via INTRAVENOUS

## 2015-02-02 MED ORDER — OXYTOCIN 10 UNIT/ML IJ SOLN
40.0000 [IU] | INTRAVENOUS | Status: DC | PRN
  Administered 2015-02-02: 40 [IU] via INTRAVENOUS

## 2015-02-02 MED ORDER — FENTANYL CITRATE (PF) 100 MCG/2ML IJ SOLN
INTRAMUSCULAR | Status: DC | PRN
  Administered 2015-02-02: 20 ug via INTRATHECAL
  Administered 2015-02-02: 80 ug via INTRAVENOUS

## 2015-02-02 MED ORDER — OXYTOCIN BOLUS FROM INFUSION: 500.0000 mL | INTRAVENOUS | Status: DC

## 2015-02-02 MED ORDER — FENTANYL CITRATE (PF) 100 MCG/2ML IJ SOLN
INTRAMUSCULAR | Status: AC
  Filled 2015-02-02: qty 4

## 2015-02-02 MED ORDER — PHENYLEPHRINE 8 MG IN D5W 100 ML (0.08MG/ML) PREMIX OPTIME
INJECTION | INTRAVENOUS | Status: DC | PRN
  Administered 2015-02-02: 60 ug/min via INTRAVENOUS

## 2015-02-02 MED ORDER — MIDAZOLAM HCL 2 MG/2ML IJ SOLN
INTRAMUSCULAR | Status: DC | PRN
  Administered 2015-02-02 (×2): 1 mg via INTRAVENOUS

## 2015-02-02 MED ORDER — OXYTOCIN 40 UNITS IN LACTATED RINGERS INFUSION - SIMPLE MED: 62.5000 mL/h | INTRAVENOUS | Status: DC

## 2015-02-02 MED ORDER — OXYCODONE-ACETAMINOPHEN 5-325 MG PO TABS: 2.0000 | ORAL_TABLET | ORAL | Status: DC | PRN

## 2015-02-02 MED ORDER — OXYCODONE-ACETAMINOPHEN 5-325 MG PO TABS: 1.0000 | ORAL_TABLET | ORAL | Status: DC | PRN

## 2015-02-02 MED ORDER — PROPOFOL 10 MG/ML IV BOLUS
INTRAVENOUS | Status: AC
  Filled 2015-02-02: qty 20

## 2015-02-02 SURGICAL SUPPLY — 51 items
APL SKNCLS STERI-STRIP NONHPOA (GAUZE/BANDAGES/DRESSINGS) ×1
BAG URINE DRAINAGE (UROLOGICAL SUPPLIES) ×2 IMPLANT
BARRIER ADHS 3X4 INTERCEED (GAUZE/BANDAGES/DRESSINGS) ×5 IMPLANT
BENZOIN TINCTURE PRP APPL 2/3 (GAUZE/BANDAGES/DRESSINGS) ×2 IMPLANT
BRR ADH 4X3 ABS CNTRL BYND (GAUZE/BANDAGES/DRESSINGS) ×2
CATH FOLEY 3WAY  5CC 18FR (CATHETERS) ×2
CATH FOLEY 3WAY 5CC 18FR (CATHETERS) IMPLANT
CATH INTERMIT  6FR 70CM (CATHETERS) ×2 IMPLANT
CLAMP CORD UMBIL (MISCELLANEOUS) IMPLANT
CLOSURE WOUND 1/2 X4 (GAUZE/BANDAGES/DRESSINGS) ×1
CLOTH BEACON ORANGE TIMEOUT ST (SAFETY) ×3 IMPLANT
DRAIN CHANNEL 10M FLAT 3/4 FLT (DRAIN) ×2 IMPLANT
DRAPE SHEET LG 3/4 BI-LAMINATE (DRAPES) IMPLANT
DRSG OPSITE POSTOP 4X10 (GAUZE/BANDAGES/DRESSINGS) ×3 IMPLANT
DURAPREP 26ML APPLICATOR (WOUND CARE) ×3 IMPLANT
ELECT REM PT RETURN 9FT ADLT (ELECTROSURGICAL) ×3
ELECTRODE REM PT RTRN 9FT ADLT (ELECTROSURGICAL) ×1 IMPLANT
EVACUATOR SILICONE 100CC (DRAIN) ×2 IMPLANT
EXTRACTOR VACUUM BELL STYLE (SUCTIONS) IMPLANT
GLOVE BIO SURGEON STRL SZ7 (GLOVE) ×9 IMPLANT
GLOVE BIOGEL PI IND STRL 7.0 (GLOVE) ×1 IMPLANT
GLOVE BIOGEL PI INDICATOR 7.0 (GLOVE) ×2
GOWN STRL REUS W/TWL LRG LVL3 (GOWN DISPOSABLE) ×10 IMPLANT
GUIDEWIRE STR DUAL SENSOR (WIRE) ×2 IMPLANT
KIT ABG SYR 3ML LUER SLIP (SYRINGE) IMPLANT
LIQUID BAND (GAUZE/BANDAGES/DRESSINGS) IMPLANT
NDL HYPO 25X5/8 SAFETYGLIDE (NEEDLE) IMPLANT
NEEDLE HYPO 25X5/8 SAFETYGLIDE (NEEDLE) IMPLANT
NS IRRIG 1000ML POUR BTL (IV SOLUTION) ×3 IMPLANT
PACK C SECTION WH (CUSTOM PROCEDURE TRAY) ×3 IMPLANT
PAD ABD 8X7 1/2 STERILE (GAUZE/BANDAGES/DRESSINGS) ×2 IMPLANT
PAD OB MATERNITY 4.3X12.25 (PERSONAL CARE ITEMS) ×3 IMPLANT
RTRCTR C-SECT PINK 25CM LRG (MISCELLANEOUS) ×3 IMPLANT
SPONGE GAUZE 4X4 12PLY STER LF (GAUZE/BANDAGES/DRESSINGS) ×2 IMPLANT
STRIP CLOSURE SKIN 1/2X4 (GAUZE/BANDAGES/DRESSINGS) ×1 IMPLANT
SUT CHROMIC 0 CTX 36 (SUTURE) ×6 IMPLANT
SUT MNCRL AB 4-0 PS2 18 (SUTURE) ×2 IMPLANT
SUT PLAIN 2 0 (SUTURE)
SUT PLAIN 2 0 XLH (SUTURE) ×3 IMPLANT
SUT PLAIN ABS 2-0 54XMFL TIE (SUTURE) IMPLANT
SUT VIC AB 0 CT1 27 (SUTURE) ×3
SUT VIC AB 0 CT1 27XBRD ANBCTR (SUTURE) ×2 IMPLANT
SUT VIC AB 0 CTX 36 (SUTURE) ×9
SUT VIC AB 0 CTX36XBRD ANBCTRL (SUTURE) ×3 IMPLANT
SUT VIC AB 2-0 CT1 27 (SUTURE) ×3
SUT VIC AB 2-0 CT1 TAPERPNT 27 (SUTURE) ×1 IMPLANT
SUT VIC AB 2-0 SH 27 (SUTURE) ×6
SUT VIC AB 2-0 SH 27XBRD (SUTURE) IMPLANT
SUT VIC AB 4-0 KS 27 (SUTURE) ×5 IMPLANT
TOWEL OR 17X24 6PK STRL BLUE (TOWEL DISPOSABLE) ×3 IMPLANT
TRAY FOLEY CATH SILVER 14FR (SET/KITS/TRAYS/PACK) IMPLANT

## 2015-02-02 NOTE — Transfer of Care (Signed)
Immediate Anesthesia Transfer of Care Note  Patient: [name]  Procedure(s) Performed: Procedure(s): CESAREAN SECTION (N/A)  Patient Location: PACU  Anesthesia Type:General and Spinal  Level of Consciousness: awake, alert  and oriented  Airway & Oxygen Therapy: Patient Spontanous Breathing  Post-op Assessment: Report given to RN and Post -op Vital signs reviewed and stable  Post vital signs: Reviewed and stable  Last Vitals:  Filed Vitals:   02/02/15 2001  BP: 154/62  Pulse: 109  Temp:   Resp: 20    Complications: No apparent anesthesia complications

## 2015-02-02 NOTE — Progress Notes (Signed)
Brought pt back in wheelchair to rm 2.  Pt reports contractions and scant bleeding.  Emotional support given. [place] CNM to room and called Venus Standard CNM.  Pt is 10 cm and Vertex by bedside ultrasound per [place].  Per [place], pt is okay for transport to L&D.  Lynette RN gave report to [facility] and pt can come 165.  Pt to 165 via stretcher.  Pt is prior C/S but wants to VBAC.

## 2015-02-02 NOTE — Anesthesia Preprocedure Evaluation (Addendum)
Anesthesia Evaluation  Patient identified by MRN, date of birth, ID band Patient awake    Reviewed: Allergy & Precautions, NPO status , Patient's Chart, lab work & pertinent test results  History of Anesthesia Complications Negative for: history of anesthetic complications  Airway Mallampati: II  TM Distance: >3 FB Neck ROM: Full    Dental  (+) Teeth Intact   Pulmonary neg pulmonary ROS,  breath sounds clear to auscultation        Cardiovascular negative cardio ROS  Rhythm:Regular     Neuro/Psych negative neurological ROS  negative psych ROS   GI/Hepatic negative GI ROS, Neg liver ROS,   Endo/Other  negative endocrine ROS  Renal/GU negative Renal ROS     Musculoskeletal   Abdominal   Peds  Hematology negative hematology ROS (+)   Anesthesia Other Findings   Reproductive/Obstetrics (+) Pregnancy                            Anesthesia Physical Anesthesia Plan  ASA: II  Anesthesia Plan: Spinal   Post-op Pain Management:    Induction:   Airway Management Planned: Nasal Cannula  Additional Equipment:   Intra-op Plan:   Post-operative Plan:   Informed Consent: I have reviewed the patients History and Physical, chart, labs and discussed the procedure including the risks, benefits and alternatives for the proposed anesthesia with the patient or authorized representative who has indicated his/her understanding and acceptance.   Dental advisory given  Plan Discussed with: CRNA and Surgeon  Anesthesia Plan Comments:         Anesthesia Quick Evaluation

## 2015-02-02 NOTE — Op Note (Signed)
Preoperative diagnosis:  1. Bladder injury  Postoperative diagnosis: 1. Bladder injury  Procedure(s): 1. Repair of bladder injury  Surgeon: Dr. Roxy Horseman, Jr  Anesthesia: Spinal converted to General  Complications: None  EBL: Minimal  Intraoperative findings: There was a large 7-8 cm full thickness opening of the bladder dome.  Indication: [name] is a 29 year old female who had a C-section tonight.  This was her second c-section.  During the procedure, her GYN team noted a laceration of the bladder and a urologic consultation was obtained for this reason on an emergent basis.  Description of procedure:  I scrubbed in and examined the bladder.  There was noted to be a large 7-8 cm full thickness opening in the dome of the bladder.  Allis clamps were placed on the bladder wall edges and the bladder was examined.  The Foley balloon was identified.  The opening was well away from the trigone and I confirmed with the GYN surgeons that they were not concerned about ureteral injuries more superiorly.  Therefore, evaluation of the ureters was not necessary.  I then closed the bladder opening in 2 layers with 2-0 vicryl suture including a full thickness layer including mucosa and a second imbricating layer in the detrusor muscle.  The bladder was then filled with 200 cc of sterile saline and the closure was confirmed to be water tight.  I then turned the case back over the GYN team.  I recommended that a pelvic drain be placed in the perivesical space.  In addition, the small 14 Fr catheter was removed and replaced with a new 18 Fr urethral catheter at the end of the procedure.  I spoke with the patient's husband and reviewed the intraoperative findings and procedure.  I explained the postoperative recovery process associated with her bladder repair and all questions were answered to his stated satisfaction.

## 2015-02-02 NOTE — H&P (Signed)
[name] is a 29 y.o. female, G3 P1 at 36.1 weeks presented to MAU SP CS in 2009 with ctx c/c/0 w/BBW wanting to [facility]  Patient Active Problem List   Diagnosis Date Noted  . Pregnancy 02/02/2015  . ASCUS favor benign 12/18/2014  . 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 Previous cesarean section--2009, CPD, mild PIH 12/18/2014  . Premature labor   . Vaginal bleeding in pregnancy     Pregnancy Course: Patient entered care at 10.6 weeks.   EDC of 03/01/15 was established by [place].      [place] evaluations:    weeks - Dating:    weeks - Anatomy:     29.2 weeks - FU: FHR 138, AFI 17.1, anterior placenta no previa, cervical length 1.2, no funneling   Significant prenatal events:   SP CS 2009 for CPD   Last evaluation:   35.6 weeks   VE:1/80/-1  On 01/31/15  Reason for admission:  Advance labor  Pt States:   Contractions Frequency: 3         Contraction severity: strong         Fetal activity: +FM  OB History    Gravida Para Term Preterm AB TAB SAB Ectopic Multiple Living   3 1 1  1  1   1      Past Medical History  Diagnosis Date  . Bacterial infection   . History of chicken pox    Past Surgical History  Procedure Laterality Date  . Cesarean section    . Dilation and curettage of uterus    . Wisdom tooth extraction     Family History: family history is not on file. Social History:  reports that she has never smoked. She has never used smokeless tobacco. She reports that she drinks alcohol. She reports that she does not use illicit drugs.   Prenatal Transfer Tool  Maternal Diabetes: No Genetic Screening: Normal Maternal Ultrasounds/Referrals: Normal Fetal Ultrasounds or other Referrals:  None, Referred to Materal Fetal Medicine  Maternal Substance Abuse:  No Significant Maternal Medications:  None Significant Maternal Lab Results: Lab values include: Group B Strep positive   ROS:  See HPI above, all other systems are negative  No Known Allergies  Dilation:  10 Effacement (%): 100 Station: -2 Exam by:: V. Philopateer Strine, CNM Blood pressure 154/62, pulse 109, temperature 97.4 F (36.3 C), temperature source Axillary, resp. rate 20, height 5\' 4"  (1.626 m), weight 150 lb (68.04 kg), last menstrual period 05/26/2014, unknown if currently breastfeeding.  Maternal Exam:  Uterine Assessment: Contraction frequency is rare.  Abdomen: Gravid, non tender. Fundal height is aga.  Normal external genitalia, vulva, cervix, uterus and adnexa.  No lesions noted on exam.  Pelvis adequate for delivery.  Fetal presentation: Vertex by US  Fetal Exam:  Monitor Surveillance : Continuous Monitoring Mode: Ultrasound.  NICHD: Category CTXs: Q 48minutes EFW   5 lbs  Physical Exam: Nursing note and vitals reviewed General: alert and cooperative She appears well nourished Psychiatric: Normal mood and affect. Her behavior is normal Head: Normocephalic Eyes: Pupils are equal, round, and reactive to light Neck: Normal range of motion Cardiovascular: RRR without murmur  Respiratory: CTAB. Effort normal  Abd: soft, non-tender, +BS, no rebound, no guarding  Genitourinary: Vagina normal  Neurological: A&Ox3 Skin: Warm and dry  Musculoskeletal: Normal range of motion  Homan's sign negative bilaterally No evidence of DVTs.  Edema: noedema DTR: 2+ Clonus: None   Prenatal labs: ABO, Rh: B/Positive/-- (02/08 0000) Antibody: Negative (02/08  0000) Rubella:   non immune RPR: Nonreactive (02/08 0000)  HBsAg: Negative (02/08 0000)  HIV: Non-reactive (02/08 0000)  GBS:  positive Sickle cell/Hgb electrophoresis:  WNL Pap ASC [place], HPV positive  08/09/14 GC:   negative Chlamydia: negative Genetic screenings:  negative Glucola:   neg  Assessment:  IUP at 36.0 weeks NICHD: Category Membranes:BBW GBS positive  Plan:  Admit to L&D for expectant management of labor. GBS prophylaxis with AMP for advance labor Anticipate SVD Labor mgmt as ordered   Attending MD  available at all times.  Ticara Waner, CNM, MSN 02/02/2015, 8:36 PM

## 2015-02-02 NOTE — MAU Provider Note (Signed)
Chief Complaint:  Labor  First Provider Initiated Contact with Patient 02/02/2015 at 1905.   HPI: [name] is a 29 y.o. G3P1011 at [redacted]w[redacted]d who presents to maternity admissions reporting strong contractions, urge to push and scant vaginal bleeding. CNM called to BS to assess pt since [facility] CNM in with another patient. Pt denies LOF. Good fetal movement. Had previous C/S for FTP. Planning TOLAC.   Past Medical History: Past Medical History  Diagnosis Date  . Bacterial infection   . History of chicken pox     Past obstetric history: OB History  Gravida Para Term Preterm AB SAB TAB Ectopic Multiple Living  3 1 1  1 1    1     # Outcome Date GA Lbr Len/2nd Weight Sex Delivery Anes PTL Lv  3 Current           2 Term     F CS-LTranv   Y  1 SAB               Past Surgical History: Past Surgical History  Procedure Laterality Date  . Cesarean section    . Dilation and curettage of uterus    . Wisdom tooth extraction     Allergies: No Known Allergies  Meds:  Prescriptions prior to admission  Medication Sig Dispense Refill Last Dose  . Prenatal Vit-Fe Fumarate-FA (PRENATAL MULTIVITAMIN) TABS tablet Take 1 tablet by mouth daily at 12 noon.   12/16/2014 at Unknown time    I have reviewed patient's Past Medical Hx, Surgical Hx, medications and allergies.   ROS:  Review of Systems  Genitourinary: Positive for vaginal bleeding (small amount of bloody show).       Neg for LOF.     Physical Exam  Patient Vitals for the past 24 hrs:  BP Temp Temp src Pulse Resp Height Weight  02/02/15 2001 (!) 154/62 mmHg - - (!) 109 20 - -  02/02/15 1918 (!) 150/93 mmHg 97.4 F (36.3 C) Axillary (!) 111 20 - -  02/02/15 1917 - - - - - 5\' 4"  (1.626 m) 150 lb (68.04 kg)   Constitutional: Well-developed, well-nourished female in severe distress.  Cardiovascular: tachycardic Respiratory: normal effort GI: Abd soft, non-tender, gravid appropriate for gestational age. Neurologic: Alert and  oriented x 4.  GU:  Dilation: 10 Dilation Complete Date: 02/02/15 Effacement (%): 100 Station: -2 Presentation: Vertex (by bedside u/s per Marlou Porch CNM) Exam by:: V. Standard, CNM, Manya Silvas, CNM  FHT:  Baseline 130 , moderate variability, no accelerations present, no decelerations in 2 minute tracing Contractions: q 2 mins, strong   Assessment: PTL, second stage Prior C/S Elevated BP  Plan: Venus Standard CNM notified of pt arrival, complete dilation, prior C/S, plan for TOLAC, elevated BP.  Pt rapidily transported to [facility]. Manya Silvas, CNM accompanied pt during transport.  Reassess BP.   [place], [place] 02/02/2015 8:38 PM

## 2015-02-02 NOTE — Anesthesia Procedure Notes (Addendum)
Procedure Name: Intubation Date/Time: 02/02/2015 10:00 PM Performed by: Ignacia Bayley Pre-anesthesia Checklist: Patient identified, Emergency Drugs available, Suction available and Patient being monitored Patient Re-evaluated:Patient Re-evaluated prior to inductionOxygen Delivery Method: Circle system utilized Preoxygenation: Pre-oxygenation with 100% oxygen Intubation Type: IV induction, Rapid sequence and Cricoid Pressure applied Laryngoscope Size: Mac and 3 Grade View: Grade I Tube type: Oral (intubation by Dr. Ermalene Postin) Tube size: 7.0 mm Number of attempts: 1 Airway Equipment and Method: Stylet Placement Confirmation: ETT inserted through vocal cords under direct vision,  positive ETCO2 and breath sounds checked- equal and bilateral Secured at: 20 cm Tube secured with: Tape Dental Injury: Teeth and Oropharynx as per pre-operative assessment    Spinal Patient location during procedure: OB Staffing Anesthesiologist: Elain Wixon, CHRIS Preanesthetic Checklist Completed: patient identified, surgical consent, pre-op evaluation, timeout performed, IV checked, risks and benefits discussed and monitors and equipment checked Spinal Block Patient position: sitting Prep: site prepped and draped and DuraPrep Patient monitoring: heart rate, cardiac monitor, continuous pulse ox and blood pressure Approach: midline Location: L3-4 Injection technique: single-shot Needle Needle type: Pencan  Needle gauge: 24 G Needle length: 10 cm Assessment Sensory level: T6

## 2015-02-02 NOTE — Progress Notes (Signed)
Labor Progress   Stat MAU call received at 1908.  Pt brought to L&D at 1910.  VE C/C/-2 w/BBW spontaneously pushing.  Pt encouraged no to push allowing time for the AMP.  Category 2, FHR 130, moderate variability, occasional accel, variable decels started at 1931.  SROM at 1936, followed by an uncontrollable urge to push.  Reoccurring 60-90 seconds decels with every ctx started at 1936.  Unresolved category 2 tracing, call for faculty attending and Dr Simona Huh for assistance at [place].  VE C/C/+2.  Dr Si Raider at the bedside at [place], Dr Glo Herring shortly after at [place].    FHR 135, moderate variability, variable decels with every ctx down to the 80's with recover to baseline and good variability. Dr Glo Herring at the bedside, watchful waiting until 2010. VE C/C/+3. Consent for vacuum by Dr Glo Herring, pt understands and agrees with POC.  Dr Simona Huh at the bedside at 2014, current FHR 125, moderate variability, + accel, last decel at 2009.  Consent for vacuum repeated, again pt agrees.  Vacuum applied by Dr Simona Huh pull x1, pop off x1.  Pt states she is tired and desires a repeat CS.  R&B reviewed, consent given.  At 2036 pt was prepped for CS.

## 2015-02-02 NOTE — Progress Notes (Signed)
Called in to delivery by CNM due to [facility] and deep variable.  H/o previous cesarean section due to CPD of ~ 5 lb baby. Dr. Luberta Mutter on stand by had previously counseled pt on vacuum. Pt pushed ~ 3 times and reported maternal exhaustion. Reviewed risks of Kiwi again.Kiwi applied pop off x 1 but suction not well applied.  Another [facility] requested. While awaiting second Kiwi pt states she wants a cesarean section several times.  Pt is informed baby delivery is imminent.  FOB asks pt to reconsider several times.  Pt declines.  Pt states she does not want to continue due to maternal exhaustion.  She was informed that she would be assisted by vacuum.  Pt still declines. Pt's decision respected.  Counseled on risks of repeat cesarean section.  Informed all future deliveries would be cesarean section.  Pt accepts.  Consent signed.

## 2015-02-02 NOTE — Plan of Care (Signed)
Problem: Consults Goal: Birthing Suites Patient Information Press F2 to bring up selections list Outcome: Completed/Met Date Met:  02/02/15  Pt < [redacted] weeks EGA TOLAC     

## 2015-02-03 ENCOUNTER — Encounter (HOSPITAL_COMMUNITY): Payer: Self-pay

## 2015-02-03 DIAGNOSIS — S3720XA Unspecified injury of bladder, initial encounter: Secondary | ICD-10-CM

## 2015-02-03 DIAGNOSIS — Z98891 History of uterine scar from previous surgery: Secondary | ICD-10-CM

## 2015-02-03 LAB — CREATININE, FLUID (PLEURAL, PERITONEAL, JP DRAINAGE): CREAT FL: 0.6 mg/dL

## 2015-02-03 LAB — CBC
HEMATOCRIT: 27 % — AB (ref 36.0–46.0)
Hemoglobin: 9.2 g/dL — ABNORMAL LOW (ref 12.0–15.0)
MCH: 26.3 pg (ref 26.0–34.0)
MCHC: 33.7 g/dL (ref 30.0–36.0)
MCV: 78 fL (ref 78.0–100.0)
Platelets: 230 10*3/uL (ref 150–400)
RBC: 3.46 MIL/uL — ABNORMAL LOW (ref 3.87–5.11)
RDW: 14.7 % (ref 11.5–15.5)
WBC: 16.1 10*3/uL — AB (ref 4.0–10.5)

## 2015-02-03 LAB — SYPHILIS: RPR W/REFLEX TO RPR TITER AND TREPONEMAL ANTIBODIES, TRADITIONAL SCREENING AND DIAGNOSIS ALGORITHM: RPR Ser Ql: NONREACTIVE

## 2015-02-03 MED ORDER — SODIUM CHLORIDE 0.9 % IJ SOLN
3.0000 mL | INTRAMUSCULAR | Status: DC | PRN
  Administered 2015-02-03: 3 mL via INTRAVENOUS
  Filled 2015-02-03: qty 3

## 2015-02-03 MED ORDER — DIPHENHYDRAMINE HCL 25 MG PO CAPS: 25.0000 mg | ORAL_CAPSULE | Freq: Four times a day (QID) | ORAL | Status: DC | PRN

## 2015-02-03 MED ORDER — NALOXONE HCL 1 MG/ML IJ SOLN
1.0000 ug/kg/h | INTRAVENOUS | Status: DC | PRN
  Filled 2015-02-03: qty 2

## 2015-02-03 MED ORDER — NALBUPHINE HCL 10 MG/ML IJ SOLN: 5.0000 mg | INTRAMUSCULAR | Status: DC | PRN

## 2015-02-03 MED ORDER — ZOLPIDEM TARTRATE 5 MG PO TABS: 5.0000 mg | ORAL_TABLET | Freq: Every evening | ORAL | Status: DC | PRN

## 2015-02-03 MED ORDER — METHYLERGONOVINE MALEATE 0.2 MG/ML IJ SOLN: 0.2000 mg | INTRAMUSCULAR | Status: DC | PRN

## 2015-02-03 MED ORDER — OXYCODONE-ACETAMINOPHEN 5-325 MG PO TABS
1.0000 | ORAL_TABLET | ORAL | Status: DC | PRN
  Administered 2015-02-04 – 2015-02-05 (×5): 1 via ORAL
  Filled 2015-02-03 (×5): qty 1

## 2015-02-03 MED ORDER — SENNOSIDES-DOCUSATE SODIUM 8.6-50 MG PO TABS
2.0000 | ORAL_TABLET | ORAL | Status: DC
  Administered 2015-02-03 – 2015-02-05 (×2): 2 via ORAL
  Filled 2015-02-03 (×2): qty 2

## 2015-02-03 MED ORDER — SIMETHICONE 80 MG PO CHEW
80.0000 mg | CHEWABLE_TABLET | Freq: Three times a day (TID) | ORAL | Status: DC
  Administered 2015-02-03 – 2015-02-05 (×8): 80 mg via ORAL
  Filled 2015-02-03 (×8): qty 1

## 2015-02-03 MED ORDER — OXYTOCIN 40 UNITS IN LACTATED RINGERS INFUSION - SIMPLE MED: 62.5000 mL/h | INTRAVENOUS | Status: AC

## 2015-02-03 MED ORDER — ONDANSETRON HCL 4 MG/2ML IJ SOLN: 4.0000 mg | Freq: Three times a day (TID) | INTRAMUSCULAR | Status: DC | PRN

## 2015-02-03 MED ORDER — LANOLIN HYDROUS EX OINT: 1.0000 "application " | TOPICAL_OINTMENT | CUTANEOUS | Status: DC | PRN

## 2015-02-03 MED ORDER — IBUPROFEN 600 MG PO TABS
600.0000 mg | ORAL_TABLET | Freq: Four times a day (QID) | ORAL | Status: DC
  Administered 2015-02-03 – 2015-02-05 (×9): 600 mg via ORAL
  Filled 2015-02-03 (×10): qty 1

## 2015-02-03 MED ORDER — DIPHENHYDRAMINE HCL 50 MG/ML IJ SOLN: 12.5000 mg | INTRAMUSCULAR | Status: DC | PRN

## 2015-02-03 MED ORDER — SIMETHICONE 80 MG PO CHEW
80.0000 mg | CHEWABLE_TABLET | ORAL | Status: DC
  Administered 2015-02-03 – 2015-02-05 (×2): 80 mg via ORAL
  Filled 2015-02-03 (×2): qty 1

## 2015-02-03 MED ORDER — METHYLERGONOVINE MALEATE 0.2 MG PO TABS: 0.2000 mg | ORAL_TABLET | ORAL | Status: DC | PRN

## 2015-02-03 MED ORDER — LACTATED RINGERS IV SOLN
INTRAVENOUS | Status: DC
  Administered 2015-02-03: 03:00:00 via INTRAVENOUS

## 2015-02-03 MED ORDER — SIMETHICONE 80 MG PO CHEW: 80.0000 mg | CHEWABLE_TABLET | ORAL | Status: DC | PRN

## 2015-02-03 MED ORDER — ACETAMINOPHEN 325 MG PO TABS: 650.0000 mg | ORAL_TABLET | ORAL | Status: DC | PRN

## 2015-02-03 MED ORDER — NALBUPHINE HCL 10 MG/ML IJ SOLN: 5.0000 mg | Freq: Once | INTRAMUSCULAR | Status: DC | PRN

## 2015-02-03 MED ORDER — MENTHOL 3 MG MT LOZG: 1.0000 | LOZENGE | OROMUCOSAL | Status: DC | PRN

## 2015-02-03 MED ORDER — DIPHENHYDRAMINE HCL 25 MG PO CAPS: 25.0000 mg | ORAL_CAPSULE | ORAL | Status: DC | PRN

## 2015-02-03 MED ORDER — OXYCODONE-ACETAMINOPHEN 5-325 MG PO TABS: 2.0000 | ORAL_TABLET | ORAL | Status: DC | PRN

## 2015-02-03 MED ORDER — WITCH HAZEL-GLYCERIN EX PADS: 1.0000 "application " | MEDICATED_PAD | CUTANEOUS | Status: DC | PRN

## 2015-02-03 MED ORDER — SCOPOLAMINE 1 MG/3DAYS TD PT72
1.0000 | MEDICATED_PATCH | Freq: Once | TRANSDERMAL | Status: DC
Start: 2015-02-03 — End: 2015-02-05
  Filled 2015-02-03: qty 1

## 2015-02-03 MED ORDER — NALOXONE HCL 0.4 MG/ML IJ SOLN: 0.4000 mg | INTRAMUSCULAR | Status: DC | PRN

## 2015-02-03 MED ORDER — DIBUCAINE 1 % RE OINT: 1.0000 "application " | TOPICAL_OINTMENT | RECTAL | Status: DC | PRN

## 2015-02-03 MED ORDER — TETANUS-DIPHTH-ACELL PERTUSSIS 5-2.5-18.5 LF-MCG/0.5 IM SUSP
0.5000 mL | Freq: Once | INTRAMUSCULAR | Status: AC
  Administered 2015-02-04: 0.5 mL via INTRAMUSCULAR

## 2015-02-03 MED ORDER — PRENATAL MULTIVITAMIN CH
1.0000 | ORAL_TABLET | Freq: Every day | ORAL | Status: DC
  Administered 2015-02-03 – 2015-02-05 (×3): 1 via ORAL
  Filled 2015-02-03 (×3): qty 1

## 2015-02-03 NOTE — Progress Notes (Signed)
[name] [id number]  Subjective: Postpartum Day 1: Repeat C/S due to variable decels in 2nd stage, patient request, VE attempt Patient has ambulated to BR, had mild dizziness, but no syncope.  Sitting up in bed without dizziness. Denies pain. Foley remains due to intraoperative bladder injury.  Patient understands will be retained x 2 weeks, then will be evaluated by urologist for removal. Feeding:  Breast and bottle Contraceptive plan:  Undecided  Objective: Temp:  [97.4 F (36.3 C)-98.7 F (37.1 C)] 98.7 F (37.1 C) (08/18 0500) Pulse Rate:  [100-121] 115 (08/18 0440) Resp:  [16-26] 18 (08/18 0340) BP: (108-154)/(62-93) 124/80 mmHg (08/18 0440) SpO2:  [96 %-100 %] 99 % (08/18 0521) Weight:  [68.04 kg (150 lb)] 68.04 kg (150 lb) (08/17 1917)   Recent Labs  02/02/15 1915 02/03/15 0555  HGB 12.3 9.2*  HCT 35.7* 27.0*  WBC 15.1* 16.1*   Orthostatics stable.  Physical Exam:  General: alert Lochia: appropriate Uterine Fundus: firm Abdomen:  + bowel sounds Incision: Honeycomb dressing CDI DVT Evaluation: No evidence of DVT seen on physical exam. Negative Homan's sign. Foley draining light pink urine. JP drain in perivesical space--45 cc overnight, 25 cc serosanguinous drainage this shift.   Venus Standard, CNM, was in to see patient during night, due to saturation of JP dressing.  Dressing was changed at that point, with pressure applied to site. JP drain dressing CDI  Dr. Alinda Money, urologist, in to see patient this am.  Assessment/Plan: Status post cesarean delivery, day 1 Previous C/S, initially desired VBAC, presented in 2nd stage labor, variable decels, VE trial x 1, then patient requested C/S Intraoperative bladder injury--Foley to be retained and JP drain in perivesical space Per urologist--indicated plan for creatnine level on drain fluid later today, but order not placed. Will order for noon. Urologist will determine plan for removal of perivesical  drain--if no evidence of urine leak, may be able to be removed tomorrow (per further urologist evaluation). Patient to be d/c'd home with foley--will need nursing instruction on cathether care and leg bag care. Will be seen by urologist in 2 weeks for cystogram and possible catheter removal. Plans outpatient circumcision. Undecided regarding contraception.    Donnel Saxon MSN, CNM 02/03/2015, 8:07 AM

## 2015-02-03 NOTE — Anesthesia Postprocedure Evaluation (Signed)
  Anesthesia Post-op Note  Patient: [name]  Procedure(s) Performed: Procedure(s): CESAREAN SECTION (N/A)  Patient Location: PACU  Anesthesia Type:General and Spinal  Level of Consciousness: awake  Airway and Oxygen Therapy: Patient Spontanous Breathing  Post-op Pain: mild  Post-op Assessment: Post-op Vital signs reviewed, Patient's Cardiovascular Status Stable, Respiratory Function Stable, Patent Airway, No signs of Nausea or vomiting and Pain level controlled LLE Motor Response: Non-purposeful movement LLE Sensation: Tingling RLE Motor Response: Non-purposeful movement RLE Sensation: Tingling      Post-op Vital Signs: Reviewed and stable  Last Vitals:  Filed Vitals:   02/03/15 0500  BP:   Pulse:   Temp: 37.1 C  Resp:     Complications: No apparent anesthesia complications

## 2015-02-03 NOTE — Progress Notes (Signed)
Patient ID: [name], female   DOB: Jan 16, 1986, 29 y.o.   MRN: [id number]  1 Day Post-Op Subjective: She is s/p c-section with intraoperative bladder injury s/p repair.  She is recovering well.  Pain is controlled. Minimal drain output.  Objective: Vital signs in last 24 hours: Temp:  [97.4 F (36.3 C)-98.7 F (37.1 C)] 98.7 F (37.1 C) (08/18 0500) Pulse Rate:  [100-121] 115 (08/18 0440) Resp:  [16-26] 18 (08/18 0340) BP: (108-154)/(62-93) 124/80 mmHg (08/18 0440) SpO2:  [96 %-100 %] 99 % (08/18 0521) Weight:  [68.04 kg (150 lb)] 68.04 kg (150 lb) (08/17 1917)  Intake/Output from previous day: 08/17 0701 - 08/18 0700 In: 3500 [I.V.:3500] Out: 1495 [Urine:650; Drains:45; Blood:800] Intake/Output this shift: Total I/O In: 3500 [I.V.:3500] Out: 1495 [Urine:650; Drains:45; Blood:800]  Physical Exam:  General: Alert and oriented Abd: Soft, dressing in place, drain with minimal serosanguinous drainage GU: Urine pinkish and draining well  Lab Results:  Recent Labs  02/02/15 1915 02/03/15 0555  HGB 12.3 9.2*  HCT 35.7* 27.0*     Studies/Results: No results found.  Assessment/Plan: POD # 1 s/p bladder injury s/p repair - Will send drain fluid for creatinine level later today and can likely be removed tomorrow if no evidence of urine leak - Continue urethral catheter drainage.  I have explained to the patient and her husband that she will need to be discharged with the catheter and will need nursing instruction on catheter and leg bag care. - She will need follow up in our office in about 2 weeks for a cystogram and possible catheter removal.   LOS: 1 day   Karman Veney,LES 02/03/2015, 6:47 AM

## 2015-02-03 NOTE — Progress Notes (Signed)
UR chart review completed.  

## 2015-02-03 NOTE — Addendum Note (Signed)
Addendum  created 02/03/15 0829 by Georgeanne Nim, CRNA   Modules edited: Notes Section   Notes Section:  File: [id number]; File: [id number]

## 2015-02-03 NOTE — Brief Op Note (Signed)
02/02/2015 - 02/03/2015  10:48 AM  PATIENT:  [name]  29 y.o. female  PRE-OPERATIVE DIAGNOSIS:IUP at 36 weeks, H/o  repeat cesarean section; active labor, declines trial of labor, maternal exhaustion, GBS positive  POST-OPERATIVE DIAGNOSIS:  Same, extensive abdominal adhesions  PROCEDURE:  Procedure(s): CESAREAN SECTION (N/A), Repeat LTCS, LOA Repair of Bladder Injury by Urologist  SURGEON:  Surgeon(s) and Role:    * Thurnell Lose, MD - Primary    * Raynelle Bring, MD    * Jonnie Kind, MD  PHYSICIAN ASSISTANT: None   ASSISTANTS: CNM Venus Standard   ANESTHESIA:   spinal and Converted to General  EBL:  Total I/O In: 700 [P.O.:180; I.V.:805] Out: -  EBL 800 per anesthesia  BLOOD ADMINISTERED:none  DRAINS: Urinary Catheter (Foley) , Abdominal drain in perivesicular space  LOCAL MEDICATIONS USED:  NONE  SPECIMEN:  No Specimen  DISPOSITION OF SPECIMEN:  N/A  COUNTS:  YES  TOURNIQUET:  * No tourniquets in log *  DICTATION: .Other Dictation: Dictation Number [id number]  PLAN OF CARE: Admit to inpatient   PATIENT DISPOSITION:  PACU - hemodynamically stable.   Delay start of Pharmacological VTE agent (>24hrs) due to surgical blood loss or risk of bleeding: yes

## 2015-02-03 NOTE — Lactation Note (Addendum)
This note was copied from the chart of [name]. Lactation Consultation Note  Patient Name: [name] [id number] Date: 02/03/2015 Reason for consult: Initial assessment;Late preterm infant  LPI, 33 hours old. Mom states that her first baby "could not tolerate" breast milk and vomited until starting formula. Mom reports that baby has been supplemented with formula, and parents aware of how to spoon-feed. Mom has pump in her room, but has not started pumping. Assisted mom to latch baby to left breast, but baby not interested at all. Allowed baby to suckle this LC's gloved finger, but baby did not exhibit a coordinated suckle. Enc parents to attempt same when latching baby. Reviewed LPI behavior, and enc limiting total feeding time to 30 minutes, and only attempting to latch for a few minutes if baby simply not interested. Enc parents to nurse with cues, and at least every 3 hours, offering lots of STS.  Assisted mom to start using DEBP, and enc pumping after each feeding for 15 minutes, followed by hand expression. Mom has [facility] appointment for Monday, 02-07-15. Faxed breastfeeding referral sheet to [place] office.  Plan is to nurse first, supplement with EBM/formula according to guideline (which were given with review), followed by DEBP for 15 minutes and hand expression. Parents given [employer] brochure, aware of OP/BFSG, community resources, and [facility] phone line assistance after D/C. Patient's RN, Beth aware of assessment, interventions, and plan. Enc parents to call for assistance as needed.   Maternal Data Has patient been taught Hand Expression?: Yes Does the patient have breastfeeding experience prior to this delivery?: Yes  Feeding Feeding Type: Breast Fed Length of feed: 0 min  LATCH Score/Interventions Latch: Too sleepy or reluctant, no latch achieved, no sucking elicited. Intervention(s): Skin to skin;Waking techniques  Audible Swallowing: None  Type of Nipple: Everted at  rest and after stimulation  Comfort (Breast/Nipple): Soft / non-tender     Hold (Positioning): Assistance needed to correctly position infant at breast and maintain latch. Intervention(s): Breastfeeding basics reviewed;Support Pillows;Position options;Skin to skin  LATCH Score: 5  Lactation Tools Discussed/Used WIC Program: Yes Pump Review: Setup, frequency, and cleaning;Milk Storage Initiated by:: JW Date initiated:: 02/03/15   Consult Status Consult Status: Follow-up Date: 02/03/15 Follow-up type: In-patient    Inocente Salles 02/03/2015, 4:13 PM

## 2015-02-03 NOTE — Anesthesia Postprocedure Evaluation (Addendum)
  Anesthesia Post-op Note  Patient: [name]  Procedure(s) Performed: Procedure(s): CESAREAN SECTION (N/A)  Patient Location: Mother/Baby  Anesthesia Type:General  Level of Consciousness: awake, alert , oriented and patient cooperative  Airway and Oxygen Therapy: Patient Spontanous Breathing  Post-op Pain: mild  Post-op Assessment: Patient's Cardiovascular Status Stable, Respiratory Function Stable, No headache, No backache and Patient able to bend at knees       Post-op Vital Signs: Reviewed and stable  Last Vitals:  Filed Vitals:   02/03/15 0500  BP:   Pulse:   Temp: 37.1 C  Resp:     Complications: No apparent anesthesia complications

## 2015-02-04 ENCOUNTER — Encounter (HOSPITAL_COMMUNITY): Payer: Self-pay | Admitting: Obstetrics and Gynecology

## 2015-02-04 NOTE — Progress Notes (Signed)
Patient ID: [name], female   DOB: 16-Jan-1986, 29 y.o.   MRN: [id number]   2 Days Post-Op Subjective: Pt continuing to improve.  She is tolerating catheter.  Objective: Vital signs in last 24 hours: Temp:  [98.3 F (36.8 C)-99 F (37.2 C)] 98.7 F (37.1 C) (08/19 0605) Pulse Rate:  [86-104] 104 (08/19 0605) Resp:  [18-20] 20 (08/19 0605) BP: (111-140)/(67-78) 140/78 mmHg (08/19 0605) SpO2:  [98 %-100 %] 98 % (08/18 2028)  Intake/Output from previous day: 08/18 0701 - 08/19 0700 In: 2065 [P.O.:1260; I.V.:805] Out: 4081 [Urine:3325; Drains:32] Intake/Output this shift: Total I/O In: 240 [P.O.:240] Out: 4481 [Urine:1600; Drains:4]  Physical Exam:  General: Alert and oriented GU: Urine light red and draining well via catheter.  Lab Results:  Recent Labs  02/02/15 1915 02/03/15 0555  HGB 12.3 9.2*  HCT 35.7* 27.0*   Drain output has been minimal,  Drain Cr 0.6   Assessment/Plan: POD # 2 s/p repair of intraoperative bladder injury - Remove pelvic drain - Continue catheter for 10-14 days - Will have nursing staff teach patient catheter and leg bag care and patient can be discharged whenever appropriate from a GYN standpoint - I will arrange f/u in about 10-14 days for a cystogram and possible catheter removal at that time - Please call if further questions   LOS: 2 days   Patrik Turnbaugh,LES 02/04/2015, 6:41 AM

## 2015-02-04 NOTE — Plan of Care (Signed)
Problem: Phase I Progression Outcomes Goal: Voiding adequately Outcome: Not Applicable Date Met:  02/04/15 Patient to go home with cath        

## 2015-02-04 NOTE — Op Note (Signed)
NAME:  [name], [name] NO.:  [id number]  MEDICAL RECORD NO.:  [id number]  LOCATION:  3570                          FACILITY:  [facility]  PHYSICIAN:  Jola Schmidt, MD   DATE OF BIRTH:  26-Feb-1986  DATE OF PROCEDURE:  02/02/2015 DATE OF DISCHARGE:                              OPERATIVE REPORT   PREOPERATIVE DIAGNOSES:  Intrauterine pregnancy at 36 weeks, history of repeat cesarean section, active labor, declines trial of labor/pushing due to maternal exhaustion, and GBS positive.  POSTOPERATIVE DIAGNOSIS:  Intrauterine pregnancy at 36 weeks, history of repeat cesarean section, active labor, declines trial of labor/pushing due to maternal exhaustion, GBS positive, and extensive abdominal adhesions.  PROCEDURE:  Repeat low-transverse cesarean section, lysis of adhesions, and repair of bladder injury by urologist.  SURGEON:  Jola Schmidt, M.D.  ASSISTANT:  Dr. Mallory Shirk, certified [place]; Urologist, Dr. Raynelle Bring; and physician assistant none.  ANESTHESIA:  Spinal converted to general.  EBL:  800 mL per anesthesia  BLOOD ADMINISTERED:  None.  DRAINS:  Foley catheter.  An abdominal drain in the perivesicular space.  LOCAL:  None.  SPECIMEN:  None.  DISPOSITION:  The patient disposition to PACU, hemodynamically stable.  COMPLICATIONS:  A 7-8 cm injury to the dome of the bladder.  FINDINGS:  A viable female infant with good Apgars.  Weight 5 pounds 9 ounces.  CPD suspected and adhesions to the anterior portion of the uterus.  Omental adhesions adhered to the end of the fallopian tube and to the uterus, keloids, anterior and lower uterine segment but not ruptured.  INDICATIONS:  [name] is a 29 year old, gravida 3, para 1, who presented in active labor, completely dilated.  She was brought to birthing suite to deliver spontaneously and the patient had a previous history of a C-section due to CPD of approximately 5 pounds  baby.  She had been counseled on a prenatal care, which was with [facility] for a trial of labor.  The patient did not have an epidural, was pushing, and deep variables were noted.  I was called to be on standby. Vacuum was attempted and there was a pop off due to a suspected malfunction of the Kiwi vacuum.  While awaiting the second vacuum, the patient decided she no longer wanted to push, which she ultimately said it was due to maternal exhaustion and she requested a C-section several times.  The patient was counseled on risk, was informed that the vacuum would help assist her deliver the baby.  She was also informed that there could be risk of injury to the bladder and bowel due to previous scar tissue and also that she would possibly require C-sections for the remainder of her deliveries.  The patient accepted all of these risks and desired to proceed.  When fetal strip was reviewed, while the patient was not pushing and baby was reassuring and no variables when pushing was not noted.  PROCEDURE IN DETAIL:  The patient was taken to the operating room.  She underwent spinal anesthesia without complication.  She was then placed in the dorsal supine position, and prepped and draped in a normal sterile fashion.  She had  a previous keloid incision, which was opened with the scalpel and carried down to the underlying layer of the fascia with the scalpel.  Her subcutaneous space was very dense and adhesed. She had minimal bleeding.  Once the fascia was identified, it was incised at the midline and extended laterally with the curved Mayo scissors.  The fascia was dissected off the rectus muscles with the curved Mayo scissors above and below.  What I thought, we were separating the rectus muscles and we identified the peritoneum tented up sharply, I did put my tips behind the peritoneum and I was able to see through the peritoneum anytime I was cutting through the  peritoneum. Once I thought I had intraperitoneal access, the abdomen was stretched, at which time, the opening that I saw at which time I noticed more bleeding and I attempted to assess the pelvis and it was noted that there was an injury to the bladder.  I could palpate the Foley bulb, so at that time, I put in the bladder blade and there was some scarring well above that where the lower uterus segment was which was very thin and a little window.  In evaluating the situation, I found that we needed to displace the bladder and go above, but we had to make more room to get to the bladder because of scarring was in the anterior portion of the uterus and to against the abdominal wall.  A medical doctor was called to assist with the surgery at this time.  We then took more of the fascia up and took down some of the rectus muscles to make more room.  Bladder blade was then placed.  The lower uterine segment was identified.  It was incised in a transverse fashion with the scalpel and extended manually.  The head was wedged into the pelvis.  It was eventually delivered atraumatically.  No nuchal cord was noted.  Baby was low in the pelvis, so arms and shoulders were pushed back so that we could get the head out.  The hysterotomy incision was closed with 0 Vicryl in a continuous running fashion.  Second layer of the same suture was noted.  There were extensions on both sides of the uterus that were grasped with Allis clamps, prior to closing and hemostasis was noted on the left side. However, on the right side, it was a little bit deeper.  We ended up ligating the uterine arteries with 2 interrupted sutures to maintain bleeding and we would eventually reapproximate the serosa in that particular area and close that dead space with a 2-0 Vicryl.  We had called for the urologist while repairing the uterus and once we had finished closing the uterus and everything was hemostatic, the  urologist had arrived.  Please see his operative note for further portion of his procedure.  Once the bladder was closed, we then exteriorized the uterus, just so we could see on that left-hand side and make sure we could visualize the anatomy.  To that one, we put in the stitch.  We felt comfortable doing so.  We were well away from the ureters.  Once the bladder was closed and everything was hemostatic, the abdomen was copiously irrigated.  We did close the peritoneum with 2-0 Vicryl prior to closure.  The omentum was adhered to the fallopian tube, which we did not take down, but we did have to take down the omentum.  We ligated with the Bovie and cauterized it.  There was  one area that we had to do a freehand tie for hemostasis.  There was also an area where the vesicouterine peritoneum was attached to the anterior portion of the abdominal wall, so we were able to break up this thick fibrous band, which I think was holding all of that peritoneum up.  We broke that down and in doing so, the peritoneum that was on top of the bladder then came down and we reapproximated that in about 2-3 places with 2-0 Vicryl and it covered the dome of the bladder very nicely.  It  did have a watertight seal, when the urologist had the bladder field; however, it was thin, so it was felt that this peritoneum reapproximated was going to help with the healing process.  So once all adhesions were broken up, the peritoneum was then reapproximated.  Because it was thin on one side, we did have to include some of the muscle in this closure.  The rectus muscles were then inspected.  The fascia was reapproximated with 0 Vicryl in a continuous running fashion and the subcutaneous space was then closed with 2-0 plain gut.  The incision was then reapproximated with 4-0 Monocryl.  Prior to closure of the fascia, the drain, which I am not familiar with has channels on the sides, it was placed in the perivesicular  space behind the bladder.  A sharp stab was made with a scalpel on the left lower quadrant and the drain was brought through with Kelly clamps.  That was sewed in with silk, at the end of the procedure.  Pressure dressing and Steri-Strips were applied.  The drain was sewed in with silk with an interrupted suture and final knot wrapped around the drain.  All instruments, sponge, and needle counts were correct x3.  The patient tolerated the procedure well.  She was then taken to the operating room in stable condition.     Jola Schmidt, MD     EBV/MEDQ  D:  02/03/2015  T:  02/04/2015  Job:  [id number]

## 2015-02-04 NOTE — Addendum Note (Signed)
Addendum  created 02/04/15 1108 by Laverle Hobby, CRNA   Modules edited: Charges VN

## 2015-02-04 NOTE — Lactation Note (Signed)
This note was copied from the chart of [name]. Lactation Consultation Note  Patient Name: [name] [id number] Date: 02/04/2015 Reason for consult: Follow-up assessment  With this mom and baby, now 77 hours old and 36 2/7 weeks CGA, and weighs 5 lbs 6.8 oz. Dad was formula bottle feeding the baby when I walked in the room. Mom sound asleep in be. Dad reports mom has been pumping every 3 hours, but no milk yet. I asked dad to have mom call me when she wakes up, so I can talk to her.    Maternal Data    Feeding    LATCH Score/Interventions                      Lactation Tools Discussed/Used     Consult Status Consult Status: Follow-up Date: 02/05/15 Follow-up type: In-patient    Tonna Corner 02/04/2015, 5:01 PM

## 2015-02-04 NOTE — Progress Notes (Signed)
[name] [id number] Postpartum Day 2 S/P repeat unscheduled Cesarean Section due to active labor, declines trial of labor, maternal exhaustion, and bladder injury.  Subjective: Patient up ad lib, denies syncope or dizziness. Reports consuming regular diet without issues and denies N/V. Patient reports negative bowel movement and is passing flatus.  Denies issues with urination and reports bleeding is "calming down."  Patient is breast & bottle feeding and reports going well.  Desires micronor for postpartum contraception but is considering other options.  Pain is being appropriately managed with use of motrin but reports an increase in cramping and discomfort with ambulation and breastfeeding.   Objective: Temp:  [98.3 F (36.8 C)-98.7 F (37.1 C)] 98.7 F (37.1 C) (08/19 0605) Pulse Rate:  [96-104] 104 (08/19 0605) Resp:  [18-20] 20 (08/19 0605) BP: (111-140)/(67-78) 140/78 mmHg (08/19 0605) SpO2:  [98 %-100 %] 98 % (08/18 2028)   Recent Labs  02/02/15 1915 02/03/15 0555  HGB 12.3 9.2*  HCT 35.7* 27.0*  WBC 15.1* 16.1*    Physical Exam:  General: alert, cooperative and no distress Mood/Affect: appropriate Lungs: clear to auscultation, no wheezes, rales or rhonchi, symmetric air entry.  Heart: normal rate and regular rhythm. Breast: breasts appear normal, no suspicious masses, no skin or nipple changes or axillary nodes. Abdomen:  + bowel sounds in upper quadrants, soft and appropriately tender, no distention  Incision: Honeycomb dressing CDI, steri strips visualized Uterine Fundus: firm at U Lochia: appropriate Foley catheter in place draining blood tinged urine Skin: normal coloration and turgor, no rashes, no suspicious skin lesions noted Warm, dry. DVT Evaluation: No evidence of DVT seen on physical exam. No cords or calf tenderness. No significant calf/ankle edema. JP drain:   ~20ml serosanguinous   Assessment Post Operative Day 2 S/P repeat C/S normal  [employer] Breast Feeding Asymptomatic Anemia Bladder injury   Plan:  -Fe+ supplementation 325mg  Daily -CBC in am to reassess -Continue other mgmt as ordered Urology F/U:  OK to remove JP drain, will keep Foley x 2 wks Will anticipate discharge tomorrow Urology to follow as appropriate Contraception information sheet placed on discharge summary Dr. Chauncey Cruel. [place] to be updated on patient status as appropriate  Sedalia Muta MSN, CNM 02/04/2015, 12:22 PM

## 2015-02-04 NOTE — Plan of Care (Signed)
Problem: Phase II Progression Outcomes Goal: Other Phase II Outcomes/Goals Outcome: Progressing Patient will be able to care for indwelling foley catheter that will go home with her. Instructions given today.for catheter care. Leg bag in room With home care instructions. Patient and husband will need further reinforcement of teaching.

## 2015-02-05 DIAGNOSIS — N9981 Other intraoperative complications of genitourinary system: Secondary | ICD-10-CM | POA: Diagnosis not present

## 2015-02-05 MED ORDER — IBUPROFEN 600 MG PO TABS: 600.0000 mg | ORAL_TABLET | Freq: Four times a day (QID) | ORAL | Status: DC | PRN

## 2015-02-05 MED ORDER — OXYCODONE-ACETAMINOPHEN 5-325 MG PO TABS: 1.0000 | ORAL_TABLET | ORAL | Status: DC | PRN

## 2015-02-05 NOTE — Discharge Instructions (Signed)
Indwelling Urinary Catheter Care  You have been given a flexible tube (catheter) used to drain the bladder. Catheters are often used when a person has difficulty urinating due to blockage, bleeding, infection, or inability to control bladder or bowel movements (incontinence). A catheter requires daily care to prevent infection and blockage. HOME CARE INSTRUCTIONS  Do the following to reduce the risk of infection. Antibiotic medicines cannot prevent infections. Limit the number of bacteria entering your bladder  Wash your hands for 2 minutes with soapy water before and after handling the catheter.  Wash your bottom and the entire catheter twice daily, as well as after each bowel movement. Wash the tip of the penis or just above the vaginal opening with soap and warm water, rinse, and then wash the rectal area. Always wash from front to back.  When changing from the leg bag to overnight bag or from the overnight bag to leg bag, thoroughly clean the end of the catheter where it connects to the tubing with an alcohol wipe.  Clean the leg bag and overnight bag daily after use. Replace your drainage bags weekly.  Always keep the tubing and bag below the level of your bladder. This allows your urine to drain properly. Lifting the bag or tubing above the level of your bladder will cause dirty urine to flow back into your bladder. If you must briefly lift the bag higher than your bladder, pinch the catheter or tubing to prevent backflow.  Drink enough water and fluids to keep your urine clear or pale yellow, or as directed by your caregiver. This will flush bacteria out of the bladder. Protect tissues from injury  Attach the catheter to your leg so there is no tension on the catheter. Use adhesive tape or a leg strap. If you are using adhesive tape, remove any sticky residue left behind by the previous tape you used.  Place your leg bag on your lower leg. Fasten the straps securely and  comfortably.  Do not remove the catheter yourself unless you have been instructed how to do so. Keep the urinary pathway open  Check throughout the day to be sure your catheter is working and urine is draining freely. Make sure the tubing does not become kinked.  Do not let the drainage bag overfill. SEEK IMMEDIATE MEDICAL CARE IF:   The catheter becomes blocked. Urine is not draining.  Urine is leaking.  You have any pain.  You have a fever. Document Released: 06/04/2005 Document Revised: 05/21/2012 Document Reviewed: 11/03/2009 [facility] Patient Information 2015 [place], [place]. This information is not intended to replace advice given to you by your health care provider. Make sure you discuss any questions you have with your health care provider.  Iron-Rich Diet  An iron-rich diet contains foods that are good sources of iron. Iron is an important mineral that helps your body produce hemoglobin. Hemoglobin is a protein in red blood cells that carries oxygen to the body's tissues. Sometimes, the iron level in your blood can be low. This may be caused by:  A lack of iron in your diet.  Blood loss.  Times of growth, such as during pregnancy or during a child's growth and development. Low levels of iron can cause a decrease in the number of red blood cells. This can result in iron deficiency anemia. Iron deficiency anemia symptoms include:  Tiredness.  Weakness.  Irritability.  Increased chance of infection. Here are some recommendations for daily iron intake:  Males older than 28  years of age need 8 mg of iron per day.  Women ages 10 to 43 need 18 mg of iron per day.  Pregnant women need 27 mg of iron per day, and women who are over 13 years of age and breastfeeding need 9 mg of iron per day.  Women over the age of 28 need 8 mg of iron per day. SOURCES OF IRON There are 2 types of iron that are found in food: heme iron and nonheme iron. Heme iron is absorbed by the body  better than nonheme iron. Heme iron is found in meat, poultry, and fish. Nonheme iron is found in grains, beans, and vegetables. Heme Iron Sources Food / Iron (mg)  Chicken liver, 3 oz (85 g)/ 10 mg  Beef liver, 3 oz (85 g)/ 5.5 mg  Oysters, 3 oz (85 g)/ 8 mg  Beef, 3 oz (85 g)/ 2 to 3 mg  Shrimp, 3 oz (85 g)/ 2.8 mg  [place], 3 oz (85 g)/ 2 mg  Chicken, 3 oz (85 g) / 1 mg  Fish (tuna, halibut), 3 oz (85 g)/ 1 mg  Pork, 3 oz (85 g)/ 0.9 mg Nonheme Iron Sources Food / Iron (mg)  Ready-to-eat breakfast cereal, iron-fortified / 3.9 to 7 mg  Tofu,  cup / 3.4 mg  Kidney beans,  cup / 2.6 mg  Baked potato with skin / 2.7 mg  Asparagus,  cup / 2.2 mg  Avocado / 2 mg  Dried peaches,  cup / 1.6 mg  Raisins,  cup / 1.5 mg  Soy milk, 1 cup / 1.5 mg  Whole-wheat bread, 1 slice / 1.2 mg  Spinach, 1 cup / 0.8 mg  Broccoli,  cup / 0.6 mg IRON ABSORPTION Certain foods can decrease the body's absorption of iron. Try to avoid these foods and beverages while eating meals with iron-containing foods:  Coffee.  Tea.  Fiber.  Soy. Foods containing vitamin C can help increase the amount of iron your body absorbs from iron sources, especially from nonheme sources. Eat foods with vitamin C along with iron-containing foods to increase your iron absorption. Foods that are high in vitamin C include many fruits and vegetables. Some good sources are:  Fresh orange juice.  Oranges.  Strawberries.  Mangoes.  Grapefruit.  Red bell peppers.  Green bell peppers.  Broccoli.  Potatoes with skin.  Tomato juice. Document Released: 01/16/2005 Document Revised: 08/27/2011 Document Reviewed: 11/23/2010 [facility] Patient Information 2015 [place], [place]. This information is not intended to replace advice given to you by your health care provider. Make sure you discuss any questions you have with your health care provider.  Postpartum Care After Cesarean Delivery After you  deliver your newborn (postpartum period), the usual stay in the hospital is 24-72 hours. If there were problems with your labor or delivery, or if you have other medical problems, you might be in the hospital longer.  While you are in the hospital, you will receive help and instructions on how to care for yourself and your newborn during the postpartum period.  While you are in the hospital:  It is normal for you to have pain or discomfort from the incision in your abdomen. Be sure to tell your nurses when you are having pain, where the pain is located, and what makes the pain worse.  If you are breastfeeding, you may feel uncomfortable contractions of your uterus for a couple of weeks. This is normal. The contractions help your uterus get back to  normal size.  It is normal to have some bleeding after delivery.  For the first 1-3 days after delivery, the flow is red and the amount may be similar to a period.  It is common for the flow to start and stop.  In the first few days, you may pass some small clots. Let your nurses know if you begin to pass large clots or your flow increases.  Do not  flush blood clots down the toilet before having the nurse look at them.  During the next 3-10 days after delivery, your flow should become more watery and pink or brown-tinged in color.  Ten to fourteen days after delivery, your flow should be a small amount of yellowish-white discharge.  The amount of your flow will decrease over the first few weeks after delivery. Your flow may stop in 6-8 weeks. Most women have had their flow stop by 12 weeks after delivery.  You should change your sanitary pads frequently.  Wash your hands thoroughly with soap and water for at least 20 seconds after changing pads, using the toilet, or before holding or feeding your newborn.  Your intravenous (IV) tubing will be removed when you are drinking enough fluids.  The urine drainage tube (urinary catheter) that was  inserted before delivery may be removed within 6-8 hours after delivery or when feeling returns to your legs. You should feel like you need to empty your bladder within the first 6-8 hours after the catheter has been removed.  In case you become weak, lightheaded, or faint, call your nurse before you get out of bed for the first time and before you take a shower for the first time.  Within the first few days after delivery, your breasts may begin to feel tender and full. This is called engorgement. Breast tenderness usually goes away within 48-72 hours after engorgement occurs. You may also notice milk leaking from your breasts. If you are not breastfeeding, do not stimulate your breasts. Breast stimulation can make your breasts produce more milk.  Spending as much time as possible with your newborn is very important. During this time, you and your newborn can feel close and get to know each other. Having your newborn stay in your room (rooming in) will help to strengthen the bond with your newborn. It will give you time to get to know your newborn and become comfortable caring for your newborn.  Your hormones change after delivery. Sometimes the hormone changes can temporarily cause you to feel sad or tearful. These feelings should not last more than a few days. If these feelings last longer than that, you should talk to your caregiver.  If desired, talk to your caregiver about methods of family planning or contraception.  Talk to your caregiver about immunizations. Your caregiver may want you to have the following immunizations before leaving the hospital:  Tetanus, diphtheria, and pertussis (Tdap) or tetanus and diphtheria (Td) immunization. It is very important that you and your family (including grandparents) or others caring for your newborn are up-to-date with the Tdap or Td immunizations. The Tdap or Td immunization can help protect your newborn from getting ill.  Rubella  immunization.  Varicella (chickenpox) immunization.  Influenza immunization. You should receive this annual immunization if you did not receive the immunization during your pregnancy. Document Released: 02/27/2012 Document Reviewed: 02/27/2012 [facility] Patient Information 2015 [place]. This information is not intended to replace advice given to you by your health care provider. Make sure you discuss  any questions you have with your health care provider.  Contraception Choices Contraception (birth control) is the use of any methods or devices to prevent pregnancy. Below are some methods to help avoid pregnancy. HORMONAL METHODS   Contraceptive implant. This is a thin, plastic tube containing progesterone hormone. It does not contain estrogen hormone. Your health care provider inserts the tube in the inner part of the upper arm. The tube can remain in place for up to 3 years. After 3 years, the implant must be removed. The implant prevents the ovaries from releasing an egg (ovulation), thickens the cervical mucus to prevent sperm from entering the uterus, and thins the lining of the inside of the uterus.  Progesterone-only injections. These injections are given every 3 months by your health care provider to prevent pregnancy. This synthetic progesterone hormone stops the ovaries from releasing eggs. It also thickens cervical mucus and changes the uterine lining. This makes it harder for sperm to survive in the uterus.  Birth control pills. These pills contain estrogen and progesterone hormone. They work by preventing the ovaries from releasing eggs (ovulation). They also cause the cervical mucus to thicken, preventing the sperm from entering the uterus. Birth control pills are prescribed by a health care provider.Birth control pills can also be used to treat heavy periods.  Minipill. This type of birth control pill contains only the progesterone hormone. They are taken every day of each  month and must be prescribed by your health care provider.  Birth control patch. The patch contains hormones similar to those in birth control pills. It must be changed once a week and is prescribed by a health care provider.  Vaginal ring. The ring contains hormones similar to those in birth control pills. It is left in the vagina for 3 weeks, removed for 1 week, and then a new one is put back in place. The patient must be comfortable inserting and removing the ring from the vagina.A health care provider's prescription is necessary.  Emergency contraception. Emergency contraceptives prevent pregnancy after unprotected sexual intercourse. This pill can be taken right after sex or up to 5 days after unprotected sex. It is most effective the sooner you take the pills after having sexual intercourse. Most emergency contraceptive pills are available without a prescription. Check with your pharmacist. Do not use emergency contraception as your only form of birth control. BARRIER METHODS   Female condom. This is a thin sheath (latex or rubber) that is worn over the penis during sexual intercourse. It can be used with spermicide to increase effectiveness.  Female condom. This is a soft, loose-fitting sheath that is put into the vagina before sexual intercourse.  Diaphragm. This is a soft, latex, dome-shaped barrier that must be fitted by a health care provider. It is inserted into the vagina, along with a spermicidal jelly. It is inserted before intercourse. The diaphragm should be left in the vagina for 6 to 8 hours after intercourse.  Cervical cap. This is a round, soft, latex or plastic cup that fits over the cervix and must be fitted by a health care provider. The cap can be left in place for up to 48 hours after intercourse.  Sponge. This is a soft, circular piece of polyurethane foam. The sponge has spermicide in it. It is inserted into the vagina after wetting it and before sexual  intercourse.  Spermicides. These are chemicals that kill or block sperm from entering the cervix and uterus. They come in the form of  creams, jellies, suppositories, foam, or tablets. They do not require a prescription. They are inserted into the vagina with an applicator before having sexual intercourse. The process must be repeated every time you have sexual intercourse. INTRAUTERINE CONTRACEPTION  Intrauterine device (IUD). This is a T-shaped device that is put in a woman's uterus during a menstrual period to prevent pregnancy. There are 2 types:  Copper IUD. This type of IUD is wrapped in copper wire and is placed inside the uterus. Copper makes the uterus and fallopian tubes produce a fluid that kills sperm. It can stay in place for 10 years.  Hormone IUD. This type of IUD contains the hormone progestin (synthetic progesterone). The hormone thickens the cervical mucus and prevents sperm from entering the uterus, and it also thins the uterine lining to prevent implantation of a fertilized egg. The hormone can weaken or kill the sperm that get into the uterus. It can stay in place for 3-5 years, depending on which type of IUD is used. PERMANENT METHODS OF CONTRACEPTION  Female tubal ligation. This is when the woman's fallopian tubes are surgically sealed, tied, or blocked to prevent the egg from traveling to the uterus.  Hysteroscopic sterilization. This involves placing a small coil or insert into each fallopian tube. Your doctor uses a technique called hysteroscopy to do the procedure. The device causes scar tissue to form. This results in permanent blockage of the fallopian tubes, so the sperm cannot fertilize the egg. It takes about 3 months after the procedure for the tubes to become blocked. You must use another form of birth control for these 3 months.  Female sterilization. This is when the female has the tubes that carry sperm tied off (vasectomy).This blocks sperm from entering the vagina  during sexual intercourse. After the procedure, the man can still ejaculate fluid (semen). NATURAL PLANNING METHODS  Natural family planning. This is not having sexual intercourse or using a barrier method (condom, diaphragm, cervical cap) on days the woman could become pregnant.  Calendar method. This is keeping track of the length of each menstrual cycle and identifying when you are fertile.  Ovulation method. This is avoiding sexual intercourse during ovulation.  Symptothermal method. This is avoiding sexual intercourse during ovulation, using a thermometer and ovulation symptoms.  Post-ovulation method. This is timing sexual intercourse after you have ovulated. Regardless of which type or method of contraception you choose, it is important that you use condoms to protect against the transmission of sexually transmitted infections (STIs). Talk with your health care provider about which form of contraception is most appropriate for you. Document Released: 06/04/2005 Document Revised: 06/09/2013 Document Reviewed: 11/27/2012 [facility] Patient Information 2015 [place], [place]. This information is not intended to replace advice given to you by your health care provider. Make sure you discuss any questions you have with your health care provider.

## 2015-02-05 NOTE — Progress Notes (Signed)
Pt going home with foley cath in place. How to care for foley cath at home handout given and discussed. Pt has leg bag. Taught how to use leg bag when going out. Pt verbalizes understanding of care of foley cath and use of leg bag.  Pt understands to follow up in 2 weeks. Dr name and number given on discharge paper.

## 2015-02-05 NOTE — Plan of Care (Signed)
Problem: Discharge Progression Outcomes Goal: Other Discharge Outcomes/Goals Outcome: Not Met (add Reason) Pt declined MMR      

## 2015-02-05 NOTE — Discharge Summary (Signed)
Cesarean Section Delivery Discharge Summary  [name]  DOB:    Oct 26, 1985 MRN:    [id number] CSN:    [id number]  Date of admission:                  02/02/15  Date of discharge:                   02/05/15  Procedures this admission:  Primary LTCS due to [facility] in 2nd stage, VE attempt x 1, intraoperative bladder repair  Date of Delivery: 02/02/15  Newborn Data:  Live born female  Birth Weight: 5 lb 8.9 oz (2520 g) APGAR: 8, 9  Home with mother. Circumcision Plan: Outpatient  History of Present Illness:  [name] is a 29 y.o. female, [phone], who presents at [redacted]w[redacted]d weeks gestation. The patient has been followed at [facility] [place] [facility] Gynecology division of [facility]   Her pregnancy has been complicated by:  Patient Active Problem List   Diagnosis Date Noted  . Intraoperative injury of bladder 02/05/2015  . Status post repeat low transverse cesarean section 02/03/2015  . ASCUS favor benign 12/18/2014  . 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 Previous cesarean section--2009, CPD, mild PIH 12/18/2014  . Premature labor   . Vaginal bleeding in pregnancy     Hospital Course--Unplanned C/S  Admitting Dx:  IUP at 36 weeks, 2nd stage labor, previous C/S, desired VBAC Rationale for C/S: NRFHR, maternal exhaustion, VE attempt x 1, patient requested C/S Anesthesia:  Spinal, converted to general Surgeon:  Dr. Simona Huh, with Dr. Alinda Money, urologist, as intraoperative consultant Complications: Bladder injury intraoperatively.  Patient admitted in 2nd stage labor, previous C/S with desire for VBAC.  Had decels with pushing, VE attempted x 1 due to patient exhaustion, with patient declining further attempts.  Spinal anesthesia converted to general due to inadequate block.  Bladder injury noted after initiation of the surgery, and Urology was called in.  The bladder was repaired by Dr. Alinda Money, and a JP drain was placed in there perivesical space.    Patient did well postoperatively, with the JP drain removed on day 3.  The foley was to remain in x 2 weeks, with plan for recheck via cystogram at Dr. Lynne Logan office at that time and possible removal at that visit. Patient was instructed on cath and leg bag care.  Intrapartum Procedures: cesarean: low cervical, transverse Postpartum Procedures: Maintenance of foley catheter x 2 weeks. Complications-Operative and Postpartum: Intraoperative bladder injury  Discharge Diagnoses: Preterm delivery at 36 weeks, repeat C/S due to maternal exhaustion, NRFHR, intraoperative bladder injury with maintenance of foley x 2 weeks.  Feeding:  breast and bottle  Contraception:  Considering OCPs, but undecided at d/c.  Hemoglobin Results:  CBC CBC Latest Ref Rng 02/03/2015 02/02/2015 07/13/2010  WBC 4.0 - 10.5 K/uL 16.1(H) 15.1(H) 5.9  Hemoglobin 12.0 - 15.0 g/dL 9.2(L) 12.3 12.6  Hematocrit 36.0 - 46.0 % 27.0(L) 35.7(L) 37.3  Platelets 150 - 400 K/uL 230 317 363      Discharge Physical Exam:   General: alert Lochia: appropriate Uterine Fundus: firm Abdomen:  + bowel sounds Incision: Honeycomb dressing CDI.  JP drain removed without difficulty, site covered with gauze pad and band-aid. DVT Evaluation: No evidence of DVT seen on physical exam. Negative Homan's sign. Foley draining pink urine.  Discharge Information:  Activity:           pelvic rest Diet:  routine Medications: Ibuprofen and Percocet Condition:      stable Instructions: Routine pp d/c s/p C/S, foley cath care  Discharge to: home  Follow-up Information    Follow up with BORDEN,LES, MD. Schedule an appointment as soon as possible for a visit in 2 weeks.   Specialty:  Urology   Why:  Call Dr. Lynne Logan office for appt in 2 week for catheter removal.   Contact information:   [address] [place] [id number] [phone]       Follow up with [facility]. Schedule an  appointment as soon as possible for a visit in 6 weeks.   Specialty:  Obstetrics and Gynecology   Why:  Call [facility] with any questions or concerns.   Contact information:   [address] [place] [address] [phone]       Waylynn Benefiel, [place] 02/05/2015 9:07 AM

## 2015-02-05 NOTE — Lactation Note (Addendum)
This note was copied from the chart of [place]. Lactation Consultation Note: Mother has been exclusively bottle feeding. Infant was fed at 1:30 with a bottle. Infant was given 15 ml of formula. Reviewed LPI instructions sheet again with parents and advised to increase infants volume. Mother is post pumping. She states she thinks her nipple is too big for infants mouth. Mother is presently pumping. She has paper work to rent a [employer]. Mother states that she has a [facility] appt on Monday. She plans to get an electric pump. Mother advised to page [facility] for latch assist . Discussed the use of a nipple shield. Mother was fit with a #24 due to nipple size. Mothers nipple is large and infant does have a small other. Mother hand expressed well. Nipples are firm and erect. Advised mother to page and I will assist with the use of the nipple shield. Father states that infant will eat again around 4p,m. I advised to feed infant every 2-3 hours and with feeding cues.  Advised mother to schedule a follow up appt with LC before going home.  Patient Name: [name] [id number] Date: 02/05/2015 Reason for consult: Follow-up assessment   Maternal Data    Feeding Feeding Type: Breast Milk with Formula added Nipple Type: Slow - flow  LATCH Score/Interventions                      Lactation Tools Discussed/Used     Consult Status      Darla Lesches 02/05/2015, 4:43 PM

## 2015-02-14 ENCOUNTER — Other Ambulatory Visit (HOSPITAL_COMMUNITY): Payer: Self-pay | Admitting: Urology

## 2015-02-14 DIAGNOSIS — N9981 Other intraoperative complications of genitourinary system: Secondary | ICD-10-CM

## 2015-02-15 ENCOUNTER — Ambulatory Visit (HOSPITAL_COMMUNITY)
Admission: RE | Admit: 2015-02-15 | Discharge: 2015-02-15 | Disposition: A | Discharge status: Home / Self Care | Payer: Medicaid Other | Source: Ambulatory Visit | Attending: Urology | Admitting: Urology

## 2015-02-15 DIAGNOSIS — N9981 Other intraoperative complications of genitourinary system: Secondary | ICD-10-CM | POA: Diagnosis not present

## 2015-02-15 DIAGNOSIS — Z48816 Encounter for surgical aftercare following surgery on the genitourinary system: Secondary | ICD-10-CM | POA: Insufficient documentation

## 2015-02-15 MED ORDER — DIATRIZOATE MEGLUMINE 30 % UR SOLN
Freq: Once | URETHRAL | Status: DC | PRN
  Administered 2015-02-15: 325 mL
  Filled 2015-02-15: qty 300

## 2015-08-17 ENCOUNTER — Encounter (HOSPITAL_BASED_OUTPATIENT_CLINIC_OR_DEPARTMENT_OTHER): Payer: Self-pay

## 2015-08-17 ENCOUNTER — Emergency Department (HOSPITAL_BASED_OUTPATIENT_CLINIC_OR_DEPARTMENT_OTHER)
Admission: EM | Admit: 2015-08-17 | Discharge: 2015-08-17 | Disposition: A | Discharge status: Home / Self Care | Payer: Medicaid Other | Attending: Emergency Medicine | Admitting: Emergency Medicine

## 2015-08-17 DIAGNOSIS — J020 Streptococcal pharyngitis: Secondary | ICD-10-CM | POA: Diagnosis not present

## 2015-08-17 DIAGNOSIS — J029 Acute pharyngitis, unspecified: Secondary | ICD-10-CM | POA: Diagnosis present

## 2015-08-17 DIAGNOSIS — R509 Fever, unspecified: Secondary | ICD-10-CM

## 2015-08-17 DIAGNOSIS — Z8619 Personal history of other infectious and parasitic diseases: Secondary | ICD-10-CM | POA: Insufficient documentation

## 2015-08-17 LAB — RAPID STREP SCREEN (MED CTR MEBANE ONLY): Streptococcus, Group A Screen (Direct): POSITIVE — AB

## 2015-08-17 MED ORDER — IBUPROFEN 800 MG PO TABS: 800.0000 mg | ORAL_TABLET | Freq: Three times a day (TID) | ORAL | Status: DC

## 2015-08-17 MED ORDER — AMOXICILLIN 500 MG PO CAPS: 1000.0000 mg | ORAL_CAPSULE | Freq: Two times a day (BID) | ORAL | Status: DC

## 2015-08-17 MED ORDER — PENICILLIN G BENZATHINE 1200000 UNIT/2ML IM SUSP
1.2000 10*6.[IU] | Freq: Once | INTRAMUSCULAR | Status: AC
  Administered 2015-08-17: 1.2 10*6.[IU] via INTRAMUSCULAR
  Filled 2015-08-17: qty 2

## 2015-08-17 NOTE — ED Provider Notes (Signed)
CSN: [id number]     Arrival date & time 08/17/15  1328 History   First MD Initiated Contact with Patient 08/17/15 1405     Chief Complaint  Patient presents with  . Sore Throat     (Consider location/radiation/quality/duration/timing/severity/associated sxs/prior Treatment) HPI   [name] is a 30 y.o. female, patient with no pertinent past medical history, presenting to the ED with sore throat, body aches, and fever for the last two days. Pt has taken Theraflu without significant relief. Pt denies shortness of breath, chest pain, N/V/D, abdominal pain, or any other complaints.   Past Medical History  Diagnosis Date  . Bacterial infection   . History of chicken pox    Past Surgical History  Procedure Laterality Date  . Cesarean section    . Dilation and curettage of uterus    . Wisdom tooth extraction    . Cesarean section N/A 02/02/2015    Procedure: CESAREAN SECTION;  Surgeon: Thurnell Lose, MD;  Location: [facility];  Service: Obstetrics;  Laterality: N/A;   No family history on file. Social History  Substance Use Topics  . Smoking status: Never Smoker   . Smokeless tobacco: Never Used  . Alcohol Use: No   OB History    Gravida Para Term Preterm AB TAB SAB Ectopic Multiple Living   3 2 1 1 1  1   0 2     Review of Systems  Constitutional: Positive for fever.  HENT: Positive for sore throat. Negative for trouble swallowing and voice change.   Respiratory: Negative for cough and shortness of breath.   Cardiovascular: Negative for chest pain.  Gastrointestinal: Negative for nausea, vomiting, abdominal pain, diarrhea and constipation.  Genitourinary: Negative for dysuria and difficulty urinating.  Musculoskeletal: Negative for neck stiffness.  Skin: Negative for color change and pallor.  Neurological: Negative for dizziness, light-headedness and headaches.  All other systems reviewed and are negative.     Allergies  Review of patient's allergies indicates no  known allergies.  Home Medications   Prior to Admission medications   Medication Sig Start Date End Date Taking? Authorizing Provider  amoxicillin (AMOXIL) 500 MG capsule Take 2 capsules (1,000 mg total) by mouth 2 (two) times daily. 08/17/15   Gila Lauf C Aleczander Fandino, PA-C  ibuprofen (ADVIL,MOTRIN) 800 MG tablet Take 1 tablet (800 mg total) by mouth 3 (three) times daily. 08/17/15   Tyrelle Raczka C Eilleen Davoli, PA-C   BP 140/85 mmHg  Pulse 102  Temp(Src) 99.1 F (37.3 C) (Oral)  Resp 20  Ht 5\' 4"  (1.626 m)  Wt 50.803 kg  BMI 19.22 kg/m2  SpO2 95%  LMP 08/17/2015  Breastfeeding? No Physical Exam  Constitutional: She appears well-developed and well-nourished. No distress.  HENT:  Head: Normocephalic and atraumatic.  Mouth/Throat: Uvula is midline and mucous membranes are normal. Posterior oropharyngeal erythema present.  Eyes: Conjunctivae are normal. Pupils are equal, round, and reactive to light.  Neck: Neck supple.  Cardiovascular: Normal rate, regular rhythm, normal heart sounds and intact distal pulses.   Pulmonary/Chest: Effort normal and breath sounds normal. No respiratory distress.  Abdominal: Soft. Bowel sounds are normal. There is no tenderness. There is no guarding.  Musculoskeletal: She exhibits no edema or tenderness.  Lymphadenopathy:    She has no cervical adenopathy.  Neurological: She is alert.  Skin: Skin is warm and dry. She is not diaphoretic.  Psychiatric: She has a normal mood and affect. Her behavior is normal.  Nursing note and vitals reviewed.   ED  Course  Procedures (including critical care time) Labs Review Labs Reviewed  RAPID STREP SCREEN (NOT AT [facility]) - Abnormal; Notable for the following:    Streptococcus, Group A Screen (Direct) POSITIVE (*)    All other components within normal limits    Imaging Review No results found. I have personally reviewed and evaluated these lab results as part of my medical decision-making.   EKG Interpretation None      MDM    Final diagnoses:  Sore throat  Fever, unspecified fever cause  Strep throat    [name] resents with sore throat and fever for the last 2 days.  Rapid strep is positive, indicating strep throat. Patient opted for IM penicillin here in the ED. This was administered with no adverse effects. Patient given instructions for symptomatic care as well as return precautions. Patient voiced understanding of these instructions and is comfortable with discharge. No changes on reevaluation just before discharge.  Lorayne Bender, PA-C 08/17/15 [place], MD 08/18/15 [phone]

## 2015-08-17 NOTE — ED Notes (Signed)
No noted S/s of Reaction to medication. Patient denies Any changes

## 2015-08-17 NOTE — Discharge Instructions (Signed)
You have been seen today for sore throat and fever. Your strep test came back positive, which indicates you have strep throat. You have been treated for the strep throat here in the ED. Drink plenty of fluids and get plenty of rest. Ibuprofen or Tylenol for pain or fever. Warm liquids or Chloraseptic spray may help soothe the sore throat. Follow up with PCP as needed if symptoms continue. Return to ED should symptoms worsen.

## 2015-08-17 NOTE — ED Notes (Signed)
Pt reports sore throat, fevers and body aches since yesterday.

## 2015-08-19 ENCOUNTER — Encounter (HOSPITAL_BASED_OUTPATIENT_CLINIC_OR_DEPARTMENT_OTHER): Payer: Self-pay | Admitting: *Deleted

## 2015-08-19 ENCOUNTER — Emergency Department (HOSPITAL_BASED_OUTPATIENT_CLINIC_OR_DEPARTMENT_OTHER)
Admission: EM | Admit: 2015-08-19 | Discharge: 2015-08-19 | Disposition: A | Discharge status: Home / Self Care | Payer: Medicaid Other | Attending: Emergency Medicine | Admitting: Emergency Medicine

## 2015-08-19 DIAGNOSIS — Z880 Allergy status to penicillin: Secondary | ICD-10-CM | POA: Insufficient documentation

## 2015-08-19 DIAGNOSIS — Z8619 Personal history of other infectious and parasitic diseases: Secondary | ICD-10-CM | POA: Diagnosis not present

## 2015-08-19 DIAGNOSIS — R21 Rash and other nonspecific skin eruption: Secondary | ICD-10-CM

## 2015-08-19 DIAGNOSIS — L299 Pruritus, unspecified: Secondary | ICD-10-CM | POA: Diagnosis not present

## 2015-08-19 MED ORDER — DIPHENHYDRAMINE HCL 25 MG PO TABS: 25.0000 mg | ORAL_TABLET | Freq: Four times a day (QID) | ORAL | Status: DC

## 2015-08-19 MED ORDER — TRIAMCINOLONE ACETONIDE 0.5 % EX OINT: 1.0000 "application " | TOPICAL_OINTMENT | Freq: Two times a day (BID) | CUTANEOUS | Status: DC

## 2015-08-19 NOTE — ED Notes (Signed)
Per pt report received PCN shot on Wednesday- rash around  mouth on Thursday and feeling like she is itching all over.

## 2015-08-19 NOTE — ED Provider Notes (Signed)
CSN: [id number]     Arrival date & time 08/19/15  2111 History   First MD Initiated Contact with Patient 08/19/15 2230     Chief Complaint  Patient presents with  . Rash     (Consider location/radiation/quality/duration/timing/severity/associated sxs/prior Treatment) HPI   Patient is a 30 year old female with no pertinent past medical history who presents to the ED with complaint of rash, onset 2 days. Patient reports she received a penicillin shot on Wednesday for treatment of strep pharyngitis. She notes on Thursday she began noticing a red rash around her mouth. She notes today she has also had the rash on her anterior chest and back. Endorses associated itching. Denies fever, chills, facial/neck swelling, tongue swelling, SOB, wheezing, CP, palpitations, abdominal pain, vomiting, diarrhea, urinary sxs, numbness, tingling, weakness, lightheaded, dizziness. Denies any drainage. Pt denies any other new medications, soaps, lotions, detergents. Patient denies taking any medications prior to arrival.  Past Medical History  Diagnosis Date  . Bacterial infection   . History of chicken pox    Past Surgical History  Procedure Laterality Date  . Cesarean section    . Dilation and curettage of uterus    . Wisdom tooth extraction    . Cesarean section N/A 02/02/2015    Procedure: CESAREAN SECTION;  Surgeon: Thurnell Lose, MD;  Location: [facility];  Service: Obstetrics;  Laterality: N/A;   History reviewed. No pertinent family history. Social History  Substance Use Topics  . Smoking status: Never Smoker   . Smokeless tobacco: Never Used  . Alcohol Use: No   OB History    Gravida Para Term Preterm AB TAB SAB Ectopic Multiple Living   3 2 1 1 1  1   0 2     Review of Systems  Skin: Positive for rash.  All other systems reviewed and are negative.     Allergies  Penicillins  Home Medications   Prior to Admission medications   Medication Sig Start Date End Date Taking? Authorizing  Provider  diphenhydrAMINE (BENADRYL) 25 MG tablet Take 1 tablet (25 mg total) by mouth every 6 (six) hours. 08/19/15   Nona Dell, PA-C  diphenhydrAMINE (BENADRYL) 25 MG tablet Take 1 tablet (25 mg total) by mouth every 6 (six) hours. 08/19/15   Chesley Noon Trexton Escamilla, PA-C   BP 138/102 mmHg  Pulse 98  Temp(Src) 98 F (36.7 C) (Oral)  Resp 18  Ht 5\' 4"  (1.626 m)  Wt 50.803 kg  BMI 19.22 kg/m2  SpO2 100%  LMP 08/17/2015 Physical Exam  Constitutional: She is oriented to person, place, and time. She appears well-developed and well-nourished.  HENT:  Head: Normocephalic and atraumatic.  Mouth/Throat: Uvula is midline, oropharynx is clear and moist and mucous membranes are normal. No trismus in the jaw. No uvula swelling. No oropharyngeal exudate, posterior oropharyngeal edema, posterior oropharyngeal erythema or tonsillar abscesses.  Eyes: Conjunctivae and EOM are normal. Right eye exhibits no discharge. Left eye exhibits no discharge. No scleral icterus.  Neck: Normal range of motion. Neck supple.  Cardiovascular: Normal rate, regular rhythm, normal heart sounds and intact distal pulses.   Pulmonary/Chest: Effort normal and breath sounds normal. No respiratory distress. She has no wheezes. She has no rales. She exhibits no tenderness.  Abdominal: Soft. Bowel sounds are normal. She exhibits no distension and no mass. There is no tenderness. There is no rebound and no guarding.  Musculoskeletal: Normal range of motion. She exhibits no edema.  Lymphadenopathy:    She has no cervical  adenopathy.  Neurological: She is alert and oriented to person, place, and time.  Skin: Skin is warm and dry.  Erythematous maculopapular rash noted to perioral region and anterior chest wall, excoriations presents. No rash present on palms or soles. No vesicles, pustules or bulla noted.  Nursing note and vitals reviewed.   ED Course  Procedures (including critical care time) Labs Review Labs  Reviewed - No data to display  Imaging Review No results found. I have personally reviewed and evaluated these images and lab results as part of my medical decision-making.   EKG Interpretation None      MDM   Final diagnoses:  Rash   Rash consistent with allergic dermatitis likely due to PCN allergy. Patient denies any difficulty breathing or swallowing.  Pt has a patent airway without stridor and is handling secretions without difficulty; no angioedema. No blisters, no pustules, no warmth, no draining sinus tracts, no superficial abscesses, no bullous impetigo, no vesicles, no desquamation, no target lesions with dusky purpura or a central bulla. Not tender to touch. No concern for superimposed infection. No concern for SJS, TEN, TSS, tick borne illness, syphilis or other life-threatening condition. Will discharge home Benadryl as needed for pruritis.  Evaluation does not show pathology requring ongoing emergent intervention or admission. Pt is hemodynamically stable and mentating appropriately. Discussed findings/results and plan with patient/guardian, who agrees with plan. All questions answered. Return precautions discussed and outpatient follow up given.        Chesley Noon [place], [place] 08/20/15 0148  Gareth Morgan, MD 08/20/15 1228

## 2015-08-19 NOTE — Discharge Instructions (Signed)
Take your medications as prescribed. I recommend notifying any of your physicians at your future appointments about your penicillin allergy. Refrain from using penicillin for treatment. Please return to the Emergency Department if symptoms worsen or new onset of fever, facial/neck swelling, difficulty breathing, tongue swelling, vomiting, chest pain, numbness, tingling.

## 2016-08-23 ENCOUNTER — Emergency Department (HOSPITAL_BASED_OUTPATIENT_CLINIC_OR_DEPARTMENT_OTHER)
Admission: EM | Admit: 2016-08-23 | Discharge: 2016-08-23 | Disposition: A | Discharge status: Home / Self Care | Payer: Self-pay | Attending: Emergency Medicine | Admitting: Emergency Medicine

## 2016-08-23 ENCOUNTER — Emergency Department (HOSPITAL_BASED_OUTPATIENT_CLINIC_OR_DEPARTMENT_OTHER): Payer: Self-pay

## 2016-08-23 ENCOUNTER — Encounter (HOSPITAL_BASED_OUTPATIENT_CLINIC_OR_DEPARTMENT_OTHER): Payer: Self-pay | Admitting: Emergency Medicine

## 2016-08-23 DIAGNOSIS — R05 Cough: Secondary | ICD-10-CM | POA: Insufficient documentation

## 2016-08-23 DIAGNOSIS — B9689 Other specified bacterial agents as the cause of diseases classified elsewhere: Secondary | ICD-10-CM

## 2016-08-23 DIAGNOSIS — R059 Cough, unspecified: Secondary | ICD-10-CM

## 2016-08-23 DIAGNOSIS — N760 Acute vaginitis: Secondary | ICD-10-CM | POA: Insufficient documentation

## 2016-08-23 DIAGNOSIS — J3489 Other specified disorders of nose and nasal sinuses: Secondary | ICD-10-CM | POA: Insufficient documentation

## 2016-08-23 LAB — URINALYSIS, ROUTINE W REFLEX MICROSCOPIC
BILIRUBIN URINE: NEGATIVE
Glucose, UA: NEGATIVE mg/dL
Hgb urine dipstick: NEGATIVE
KETONES UR: NEGATIVE mg/dL
NITRITE: NEGATIVE
Protein, ur: NEGATIVE mg/dL
Specific Gravity, Urine: 1.027 (ref 1.005–1.030)
pH: 6.5 (ref 5.0–8.0)

## 2016-08-23 LAB — URINALYSIS, MICROSCOPIC (REFLEX)

## 2016-08-23 LAB — WET PREP, GENITAL
SPERM: NONE SEEN
Trich, Wet Prep: NONE SEEN
Yeast Wet Prep HPF POC: NONE SEEN

## 2016-08-23 LAB — PREGNANCY, URINE: Preg Test, Ur: NEGATIVE

## 2016-08-23 MED ORDER — METRONIDAZOLE 500 MG PO TABS: 500.0000 mg | ORAL_TABLET | Freq: Two times a day (BID) | ORAL | 0 refills | Status: DC

## 2016-08-23 MED ORDER — BENZONATATE 100 MG PO CAPS
100.0000 mg | ORAL_CAPSULE | Freq: Once | ORAL | Status: AC
  Administered 2016-08-23: 100 mg via ORAL
  Filled 2016-08-23: qty 1

## 2016-08-23 MED ORDER — BENZONATATE 100 MG PO CAPS: 100.0000 mg | ORAL_CAPSULE | Freq: Three times a day (TID) | ORAL | 0 refills | Status: DC

## 2016-08-23 NOTE — ED Provider Notes (Signed)
[facility] Provider Note   CSN: [id number] Arrival date & time: 08/23/16  1911  By signing my name below, I, [name], attest that this documentation has been prepared under the direction and in the presence of Etta Quill, NP. Electronically Signed: [name], Scribe. 08/23/2016. 7:51 PM.  History   Chief Complaint Chief Complaint  Patient presents with  . Pelvic Pain    The history is provided by the patient. No language interpreter was used.  Abdominal Pain   This is a new problem. The current episode started more than 2 days ago. The problem occurs hourly. The problem has been gradually worsening. The pain is associated with an unknown factor. The pain is located in the suprapubic region. The quality of the pain is pressure-like and cramping. The pain is moderate. Pertinent negatives include fever, dysuria and frequency. Nothing aggravates the symptoms. Nothing relieves the symptoms. Past workup does not include GI consult, CT scan or ultrasound. Her past medical history does not include PUD, gallstones, GERD, ulcerative colitis, Crohn's disease or irritable bowel syndrome.     HPI Comments: [name] is a 31 y.o. female who presents to the Emergency Department complaining of intermittent suprapubic pain beginning three days ago, constant today. She describes her pain as cramping and pressure-like in nature. No modifying factors noted. She reports associated occasional vaginal discharge as well. Pt notes she is currently sexually active with one partner without protection and states her partner is also monogamous. She denies urinary frequency, dysuria, or any other associated symptoms.  Pt is also complaining of a persistent cough and rhinorrhea for four weeks. She states her cough is occasionally productive with clear sputum. Pt denies fever, chills, or any other associated symptoms.  Past Medical History:  Diagnosis Date  . Bacterial infection   .  History of chicken pox     Patient Active Problem List   Diagnosis Date Noted  . Intraoperative injury of bladder 02/05/2015  . Status post repeat low transverse cesarean section 02/03/2015  . ASCUS favor benign 12/18/2014  . 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 Previous cesarean section--2009, CPD, mild PIH 12/18/2014  . Premature labor   . Vaginal bleeding in pregnancy     Past Surgical History:  Procedure Laterality Date  . CESAREAN SECTION    . CESAREAN SECTION N/A 02/02/2015   Procedure: CESAREAN SECTION;  Surgeon: Thurnell Lose, MD;  Location: [facility];  Service: Obstetrics;  Laterality: N/A;  . DILATION AND CURETTAGE OF UTERUS    . WISDOM TOOTH EXTRACTION      OB History    Gravida Para Term Preterm AB Living   3 2 1 1 1 2    SAB TAB Ectopic Multiple Live Births   1     0 2       Home Medications    Prior to Admission medications   Medication Sig Start Date End Date Taking? Authorizing Provider  diphenhydrAMINE (BENADRYL) 25 MG tablet Take 1 tablet (25 mg total) by mouth every 6 (six) hours. 08/19/15   Nona Dell, PA-C  diphenhydrAMINE (BENADRYL) 25 MG tablet Take 1 tablet (25 mg total) by mouth every 6 (six) hours. 08/19/15   Nona Dell, PA-C    Family History History reviewed. No pertinent family history.  Social History Social History  Substance Use Topics  . Smoking status: Never Smoker  . Smokeless tobacco: Never Used  . Alcohol use No     Allergies   Penicillins   Review  of Systems Review of Systems  Constitutional: Negative for chills and fever.  HENT: Positive for rhinorrhea.   Respiratory: Positive for cough.   Gastrointestinal: Positive for abdominal pain.  Genitourinary: Positive for vaginal discharge. Negative for dysuria and frequency.  All other systems reviewed and are negative.   Physical Exam Updated Vital Signs BP 141/97 (BP Location: Right Arm)   Pulse 100   Temp 99.9 F (37.7 C) (Oral)   Resp 18   Ht  5\' 4"  (1.626 m)   Wt 112 lb (50.8 kg)   LMP 08/16/2016   SpO2 100%   BMI 19.22 kg/m   Physical Exam  Constitutional: She is oriented to person, place, and time. She appears well-developed and well-nourished. No distress.  HENT:  Head: Normocephalic and atraumatic.  Mouth/Throat: Posterior oropharyngeal erythema present.  Eyes: Conjunctivae and EOM are normal.  Neck: Neck supple. No tracheal deviation present.  Cardiovascular: Normal rate.   Pulmonary/Chest: Effort normal. No respiratory distress.  Lungs CTA.  Abdominal: There is tenderness.  Lower abdominal discomfort bilaterally over the bladder.  Genitourinary: Pelvic exam was performed with patient supine. There is no rash, tenderness or lesion on the right labia. There is no rash, tenderness or lesion on the left labia. Cervix exhibits no motion tenderness. Right adnexum displays no mass and no tenderness. Left adnexum displays no mass and no tenderness. No erythema or tenderness in the vagina.  Genitourinary Comments: Pelvic exam performed with chaperone present.   Musculoskeletal: Normal range of motion.  Neurological: She is alert and oriented to person, place, and time.  Skin: Skin is warm and dry.  Psychiatric: She has a normal mood and affect. Her behavior is normal.  Nursing note and vitals reviewed.   ED Treatments / Results  Labs (all labs ordered are listed, but only abnormal results are displayed) Labs Reviewed  URINALYSIS, ROUTINE W REFLEX MICROSCOPIC - Abnormal; Notable for the following:       Result Value   APPearance CLOUDY (*)    Leukocytes, UA MODERATE (*)    All other components within normal limits  URINALYSIS, MICROSCOPIC (REFLEX) - Abnormal; Notable for the following:    Bacteria, UA FEW (*)    Squamous Epithelial / LPF 0-5 (*)    All other components within normal limits  PREGNANCY, URINE    EKG  EKG Interpretation None       Radiology Dg Chest 2 View  Result Date: 08/23/2016 CLINICAL  DATA:  Productive cough for 4 week EXAM: CHEST  2 VIEW COMPARISON:  None. FINDINGS: The heart size and mediastinal contours are within normal limits. Both lungs are clear. Mild scoliosis of the spine. IMPRESSION: No active cardiopulmonary disease. Electronically Signed   By: Donavan Foil M.D.   On: 08/23/2016 20:23    Procedures Procedures (including critical care time)  DIAGNOSTIC STUDIES: Oxygen Saturation is 100% on RA, normal by my interpretation.    COORDINATION OF CARE: 7:55 PM Discussed treatment plan with pt at bedside and pt agreed to plan.  Medications Ordered in ED Medications - No data to display   Initial Impression / Assessment and Plan / ED Course  I have reviewed the triage vital signs and the nursing notes.  Pertinent labs & imaging results that were available during my care of the patient were reviewed by me and considered in my medical decision making (see chart for details).    Pt symptoms consistent with URI. CXR negative for acute infiltrate. Pt will be discharged with symptomatic  treatment.   Pt also diagnosed with bacterial vaginosis. Pt is afebrile, without tachycardia, hypotension, or other signs of serious infection.  Pt to be dc home with flagyl and instructions to follow up with PCP if symptoms persist. Discussed return precautions. Pt appears safe for discharge.   Final Clinical Impressions(s) / ED Diagnoses   Final diagnoses:  BV (bacterial vaginosis)  Cough in adult    New Prescriptions Discharge Medication List as of 08/23/2016  8:56 PM    START taking these medications   Details  benzonatate (TESSALON) 100 MG capsule Take 1 capsule (100 mg total) by mouth every 8 (eight) hours., Starting Thu 08/23/2016, Print    metroNIDAZOLE (FLAGYL) 500 MG tablet Take 1 tablet (500 mg total) by mouth 2 (two) times daily. One po bid x 7 days, Starting Thu 08/23/2016, Print       I personally performed the services described in this documentation, which was  scribed in my presence. The recorded information has been reviewed and is accurate.   Etta Quill, NP 08/23/16 2354    Leo Grosser, MD 08/24/16 [phone]

## 2016-08-23 NOTE — ED Triage Notes (Signed)
Patient states that she is having lower pelvic pain intermittently sicne Monday. Reports that it "sometimes feels like pressure, sometimes gas" - when she pushes on it it is better. Patient also hs had a cough x 4 weeks

## 2016-08-23 NOTE — ED Notes (Signed)
Pt reports lower abd pressure x 4 days denies N/V fevers abnormal vaginal bleeding or DC. Also with cough x 4 weeks

## 2016-08-23 NOTE — ED Notes (Signed)
Pt verbalizes understanding of d/c instructions and denies any further needs at this time. 

## 2016-08-27 LAB — GC/CHLAMYDIA PROBE AMP (~~LOC~~) NOT AT ARMC
Chlamydia: POSITIVE — AB
Neisseria Gonorrhea: NEGATIVE

## 2017-11-02 IMAGING — CR DG CHEST 2V
2 series · 2 of 2 positions shown · non-contrast
Comparison: None.

CLINICAL DATA: Productive cough for 4 week

EXAM:
CHEST  2 VIEW

[w chest pa *]
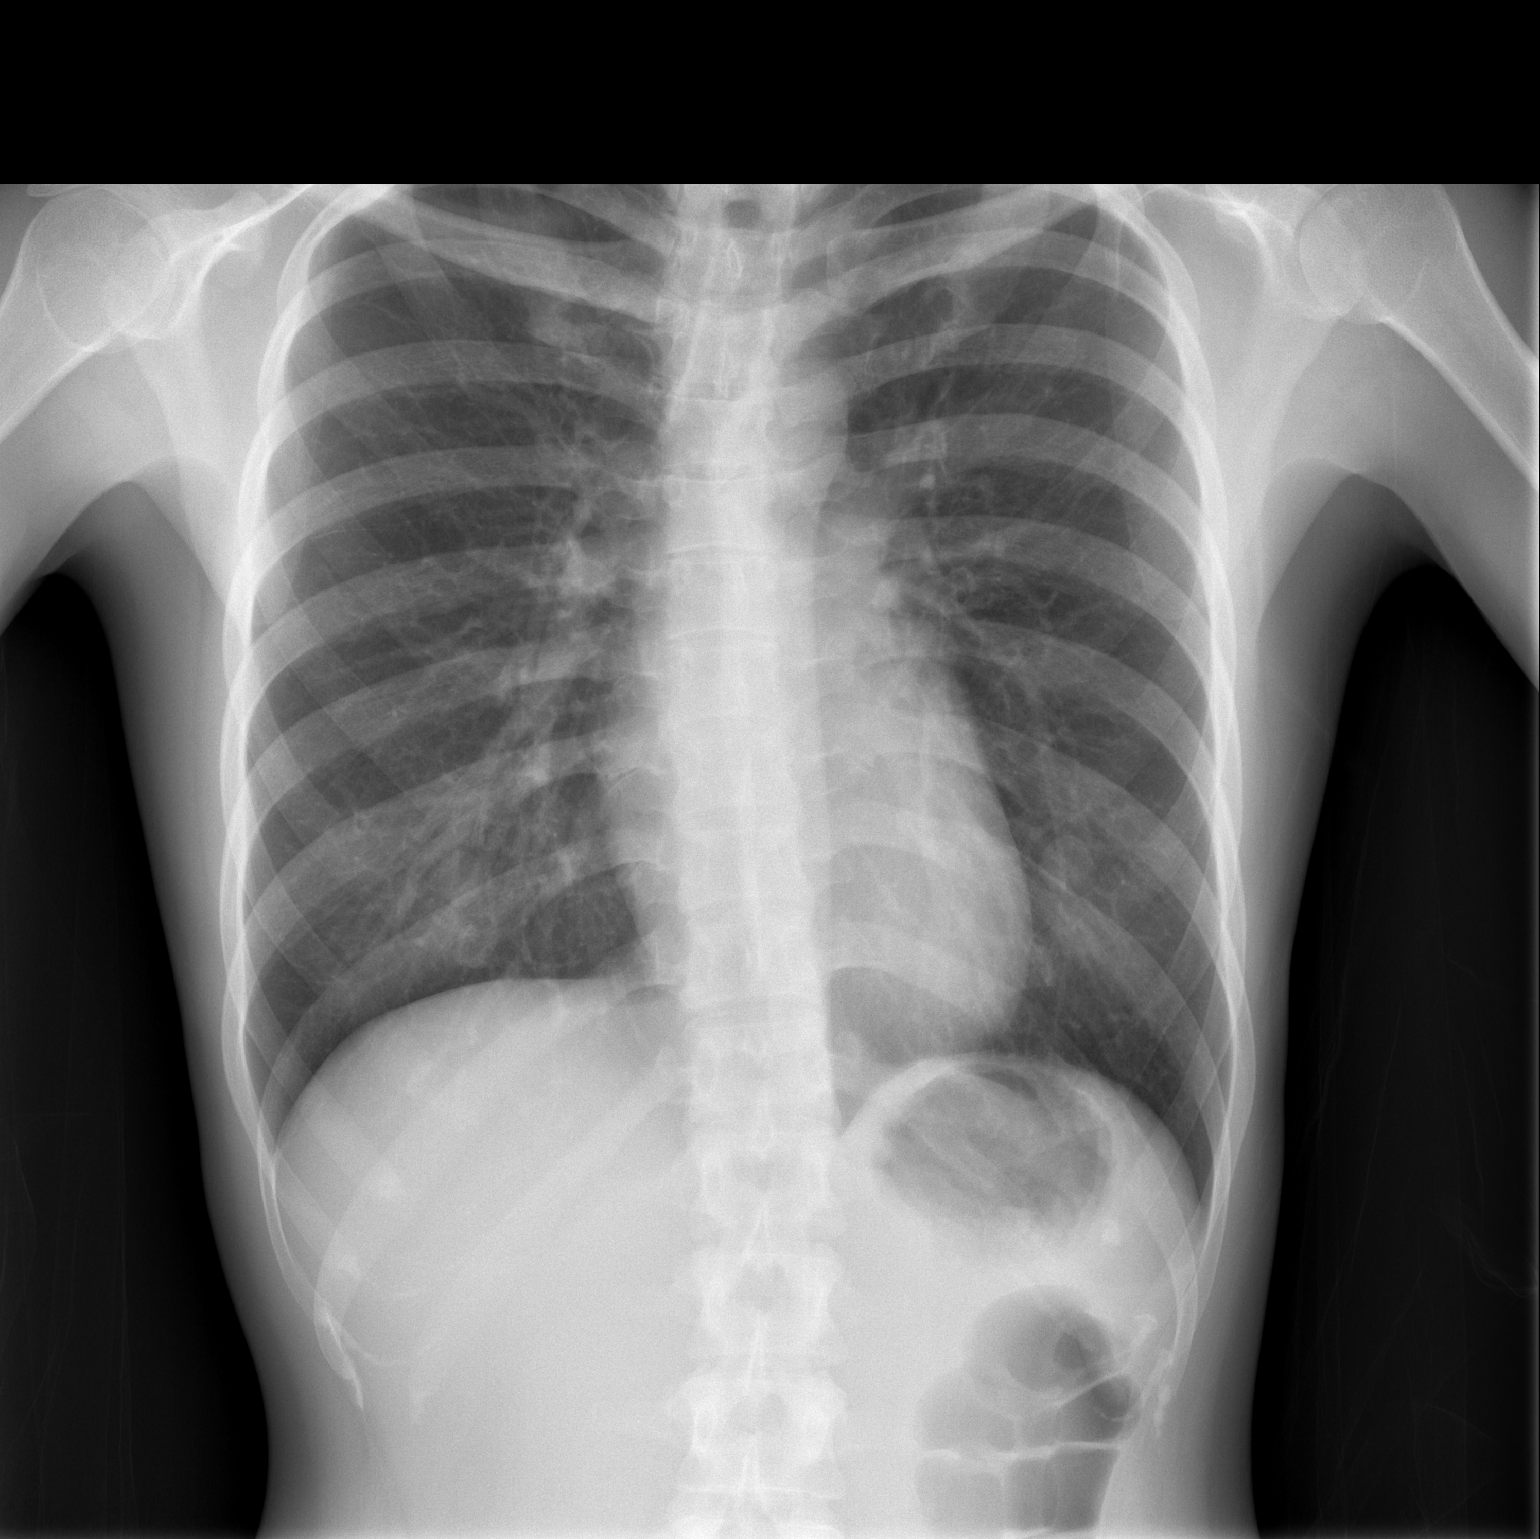

[w chest lat *]
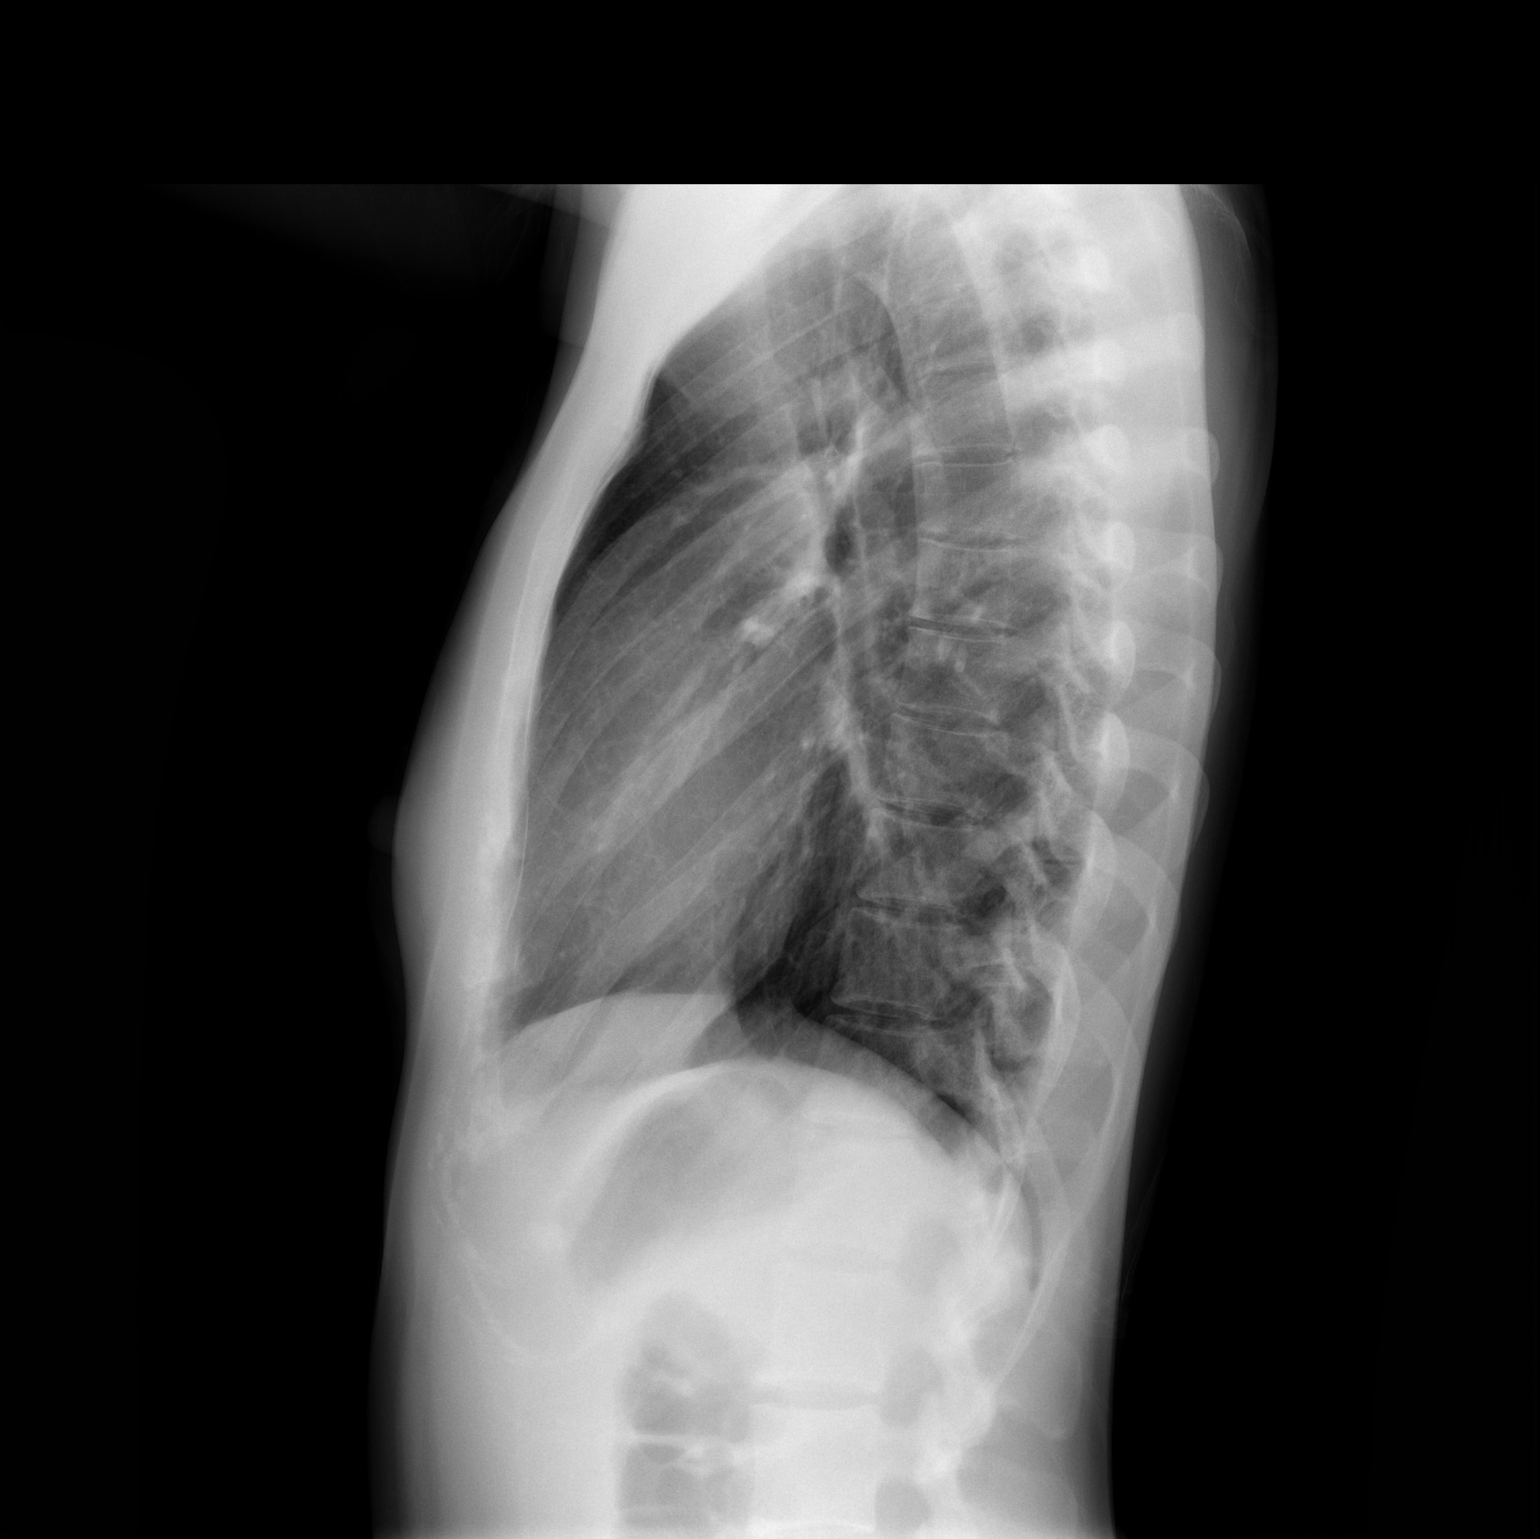

[2 of 2 positions shown; findings below may reference images not displayed]

FINDINGS: The heart size and mediastinal contours are within normal limits.
Both lungs are clear. Mild scoliosis of the spine.
IMPRESSION: No active cardiopulmonary disease.

## 2019-06-23 ENCOUNTER — Ambulatory Visit: Payer: Self-pay | Attending: Internal Medicine

## 2019-06-23 DIAGNOSIS — Z20822 Contact with and (suspected) exposure to covid-19: Secondary | ICD-10-CM

## 2019-06-23 DIAGNOSIS — U071 COVID-19: Secondary | ICD-10-CM | POA: Insufficient documentation

## 2019-06-26 ENCOUNTER — Telehealth: Payer: Self-pay | Admitting: *Deleted

## 2019-06-26 ENCOUNTER — Telehealth: Payer: Self-pay

## 2019-06-26 LAB — NOVEL CORONAVIRUS, NAA: SARS-CoV-2, NAA: DETECTED — AB

## 2019-06-26 NOTE — Telephone Encounter (Signed)
Spoke with pt when I called her child's result. This pt said she had already been notified. I gave her the information for her child and then assisted her in determining her dates of quarantine/isolation. Verbalized understanding, no further questions.

## 2019-06-26 NOTE — Telephone Encounter (Signed)
Attempted to call pt back, no answer and message given,  that the call cannot be completed at this time.

## 2022-02-27 ENCOUNTER — Emergency Department (HOSPITAL_BASED_OUTPATIENT_CLINIC_OR_DEPARTMENT_OTHER)
Admission: EM | Admit: 2022-02-27 | Discharge: 2022-02-27 | Disposition: A | Discharge status: Home / Self Care | Payer: Medicaid Other | Attending: Emergency Medicine | Admitting: Emergency Medicine

## 2022-02-27 ENCOUNTER — Emergency Department (HOSPITAL_BASED_OUTPATIENT_CLINIC_OR_DEPARTMENT_OTHER): Payer: Medicaid Other

## 2022-02-27 ENCOUNTER — Encounter (HOSPITAL_BASED_OUTPATIENT_CLINIC_OR_DEPARTMENT_OTHER): Payer: Self-pay | Admitting: Emergency Medicine

## 2022-02-27 ENCOUNTER — Other Ambulatory Visit: Payer: Self-pay

## 2022-02-27 DIAGNOSIS — Z3491 Encounter for supervision of normal pregnancy, unspecified, first trimester: Secondary | ICD-10-CM

## 2022-02-27 DIAGNOSIS — Z3A08 8 weeks gestation of pregnancy: Secondary | ICD-10-CM | POA: Diagnosis not present

## 2022-02-27 DIAGNOSIS — O0289 Other abnormal products of conception: Secondary | ICD-10-CM | POA: Diagnosis not present

## 2022-02-27 DIAGNOSIS — B9689 Other specified bacterial agents as the cause of diseases classified elsewhere: Secondary | ICD-10-CM | POA: Insufficient documentation

## 2022-02-27 DIAGNOSIS — O23591 Infection of other part of genital tract in pregnancy, first trimester: Secondary | ICD-10-CM | POA: Diagnosis not present

## 2022-02-27 DIAGNOSIS — O209 Hemorrhage in early pregnancy, unspecified: Secondary | ICD-10-CM | POA: Diagnosis present

## 2022-02-27 DIAGNOSIS — O4690 Antepartum hemorrhage, unspecified, unspecified trimester: Secondary | ICD-10-CM

## 2022-02-27 DIAGNOSIS — R8271 Bacteriuria: Secondary | ICD-10-CM

## 2022-02-27 LAB — URINALYSIS, ROUTINE W REFLEX MICROSCOPIC
Bilirubin Urine: NEGATIVE
Glucose, UA: NEGATIVE mg/dL
Ketones, ur: NEGATIVE mg/dL
Leukocytes,Ua: NEGATIVE
Nitrite: POSITIVE — AB
Protein, ur: NEGATIVE mg/dL
Specific Gravity, Urine: >=1.03 (ref 1.005–1.030)
pH: 5.5 (ref 5.0–8.0)

## 2022-02-27 LAB — COMPREHENSIVE METABOLIC PANEL
ALT: 42 U/L (ref 0–44)
AST: 32 U/L (ref 15–41)
Albumin: 4 g/dL (ref 3.5–5.0)
Alkaline Phosphatase: 32 U/L — ABNORMAL LOW (ref 38–126)
Anion gap: 5 (ref 5–15)
BUN: 14 mg/dL (ref 6–20)
CO2: 24 mmol/L (ref 22–32)
Calcium: 8.4 mg/dL — ABNORMAL LOW (ref 8.9–10.3)
Chloride: 107 mmol/L (ref 98–111)
Creatinine, Ser: 0.6 mg/dL (ref 0.44–1.00)
GFR, Estimated: >60 mL/min (ref >60)
Glucose, Bld: 104 mg/dL — ABNORMAL HIGH (ref 70–99)
Potassium: 3.7 mmol/L (ref 3.5–5.1)
Sodium: 136 mmol/L (ref 135–145)
Total Bilirubin: 1.3 mg/dL — ABNORMAL HIGH (ref 0.3–1.2)
Total Protein: 7.2 g/dL (ref 6.5–8.1)

## 2022-02-27 LAB — CBC WITH DIFFERENTIAL/PLATELET
Abs Immature Granulocytes: 0.02 10*3/uL (ref 0.00–0.07)
Basophils Absolute: 0 10*3/uL (ref 0.0–0.1)
Basophils Relative: 1 %
Eosinophils Absolute: 0.2 10*3/uL (ref 0.0–0.5)
Eosinophils Relative: 3 %
HCT: 36.5 % (ref 36.0–46.0)
Hemoglobin: 12.3 g/dL (ref 12.0–15.0)
Immature Granulocytes: 0 %
Lymphocytes Relative: 38 %
Lymphs Abs: 2.8 10*3/uL (ref 0.7–4.0)
MCH: 25.9 pg — ABNORMAL LOW (ref 26.0–34.0)
MCHC: 33.7 g/dL (ref 30.0–36.0)
MCV: 76.8 fL — ABNORMAL LOW (ref 80.0–100.0)
Monocytes Absolute: 0.5 10*3/uL (ref 0.1–1.0)
Monocytes Relative: 7 %
Neutro Abs: 3.8 10*3/uL (ref 1.7–7.7)
Neutrophils Relative %: 51 %
Platelets: 385 10*3/uL (ref 150–400)
RBC: 4.75 MIL/uL (ref 3.87–5.11)
RDW: 14.3 % (ref 11.5–15.5)
WBC: 7.5 10*3/uL (ref 4.0–10.5)
nRBC: 0 % (ref 0.0–0.2)

## 2022-02-27 LAB — WET PREP, GENITAL
Sperm: NONE SEEN
Trich, Wet Prep: NONE SEEN
WBC, Wet Prep HPF POC: >=10 — AB (ref <10)
Yeast Wet Prep HPF POC: NONE SEEN

## 2022-02-27 LAB — URINALYSIS, MICROSCOPIC (REFLEX)

## 2022-02-27 LAB — HCG, QUANTITATIVE, PREGNANCY: hCG, Beta Chain, Quant, S: 107149 m[IU]/mL — ABNORMAL HIGH (ref <5)

## 2022-02-27 LAB — ABO/RH: ABO/RH(D): B POS

## 2022-02-27 MED ORDER — METRONIDAZOLE 500 MG PO TABS: 500.0000 mg | ORAL_TABLET | Freq: Two times a day (BID) | ORAL | 0 refills | Status: DC

## 2022-02-27 MED ORDER — METRONIDAZOLE 500 MG PO TABS
500.0000 mg | ORAL_TABLET | Freq: Once | ORAL | Status: AC
  Administered 2022-02-27: 500 mg via ORAL
  Filled 2022-02-27: qty 1

## 2022-02-27 MED ORDER — FOSFOMYCIN TROMETHAMINE 3 G PO PACK
3.0000 g | PACK | Freq: Once | ORAL | Status: AC
  Administered 2022-02-27: 3 g via ORAL
  Filled 2022-02-27: qty 3

## 2022-02-27 NOTE — ED Triage Notes (Signed)
Patient presents to ED via POV from home. Here with vaginal bleeding that began yesterday. Patient denies going through multiple pads an hour. Reports yesterday after voiding she noticed a few blood clots. Endorses LLQ pain and left flank pain. Reports positive pregnancy test at home. Estimated LMP 07/18.

## 2022-02-27 NOTE — Discharge Instructions (Signed)
You were seen for vaginal bleeding in pregnancy.  Your ultrasound showed an 8-week healthy pregnancy.  Take the antibiotics as prescribed for treatment of bacterial vaginosis.  This is not an STD.  Make an appointment with the OB/GYN team or your current OB/GYN for follow-up and treatment of your current pregnancy.  Come back if severe worsening bleeding such as bleeding through a pad every hour for 3 or more hours, worsening cramping abdominal pain, vomiting that is uncontrollable or any other symptoms concerning to you.

## 2022-02-27 NOTE — ED Provider Notes (Signed)
[name] Provider Note   CSN: [id number] Arrival date & time: 02/27/22  0735     History  Chief Complaint  Patient presents with   Vaginal Bleeding    [name] is a 36 y.o. female. 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  She had a pregnancy test which was confirmed at a clinic but no transvaginal ultrasound for this pregnancy.  She started developing small clots yesterday but no soaking through pads.  She has had previous vaginal bleeding in her last pregnancy but no complications otherwise.  She has no history of ectopic or GU surgery.  She has had no fevers, no vomiting, no chest pain, no shortness of breath, no fainting, no urinary symptoms, no dysuria, no abdominal pain or vaginal itching or burning.   Vaginal Bleeding      Home Medications Prior to Admission medications   Medication Sig Start Date End Date Taking? Authorizing Provider  metroNIDAZOLE (FLAGYL) 500 MG tablet Take 1 tablet (500 mg total) by mouth 2 (two) times daily. 02/27/22  Yes Cheryl Congo, MD  benzonatate (TESSALON) 100 MG capsule Take 1 capsule (100 mg total) by mouth every 8 (eight) hours. 08/23/16   Etta Quill, NP  diphenhydrAMINE (BENADRYL) 25 MG tablet Take 1 tablet (25 mg total) by mouth every 6 (six) hours. 08/19/15   Nona Dell, PA-C  diphenhydrAMINE (BENADRYL) 25 MG tablet Take 1 tablet (25 mg total) by mouth every 6 (six) hours. 08/19/15   Nona Dell, PA-C  metroNIDAZOLE (FLAGYL) 500 MG tablet Take 1 tablet (500 mg total) by mouth 2 (two) times daily. One po bid x 7 days 08/23/16   Etta Quill, NP      Allergies    Penicillins    Review of Systems   Review of Systems  Genitourinary:  Positive for vaginal bleeding.    Physical Exam Updated Vital Signs BP 120/80   Pulse 87   Temp 98.2 F (36.8 C) (Oral)   Resp 16   LMP 01/02/2022   SpO2 100%   Physical Exam Constitutional: Alert and oriented. Well appearing and in no distress. Eyes: Conjunctivae are normal. ENT      Head: Normocephalic and atraumatic.      Nose: No congestion.      Mouth/Throat: Mucous membranes are moist.      Neck: No stridor. Cardiovascular: S1, S2,  RRR.Warm and well perfused. Respiratory: Normal respiratory effort. Gastrointestinal: Soft and nontender.  GU: Cervical os is closed with no CMT.  Mild dark bloody discharge coming from cervix and mild pooling in the fornix.  Musculoskeletal: Normal range of motion in all extremities.      Right lower leg: No tenderness or edema.      Left lower leg: No tenderness or edema. Neurologic: Normal speech and language. No gross focal neurologic deficits are appreciated. Skin: Skin is warm, dry and intact. No rash noted. Psychiatric: Mood and affect are normal. Speech and behavior are normal.  ED Results / Procedures / Treatments   Labs (all labs ordered are listed, but only abnormal results are displayed) Labs Reviewed  WET PREP, GENITAL - Abnormal; Notable for the following components:      Result Value   Clue Cells Wet Prep HPF POC PRESENT (*)    WBC, Wet Prep HPF POC >=10 (*)    All other components within normal limits  COMPREHENSIVE METABOLIC PANEL -  Abnormal; Notable for the following components:   Glucose, Bld 104 (*)    Calcium 8.4 (*)    Alkaline Phosphatase 32 (*)    Total Bilirubin 1.3 (*)    All other components within normal limits  CBC WITH DIFFERENTIAL/PLATELET - Abnormal; Notable for the following components:   MCV 76.8 (*)    MCH 25.9 (*)    All other components within normal limits  HCG, QUANTITATIVE, PREGNANCY - Abnormal; Notable for the following components:   hCG, Beta Chain, Quant, S 107,149 (*)    All other components within normal limits  URINALYSIS, ROUTINE W REFLEX MICROSCOPIC - Abnormal; Notable for the following components:   APPearance CLOUDY (*)    Hgb urine dipstick  MODERATE (*)    Nitrite POSITIVE (*)    All other components within normal limits  URINALYSIS, MICROSCOPIC (REFLEX) - Abnormal; Notable for the following components:   Bacteria, UA MANY (*)    All other components within normal limits  ABO/RH  GC/CHLAMYDIA PROBE AMP ([place]) NOT AT [facility]    EKG None  Radiology US OB LESS THAN 14 WEEKS WITH OB TRANSVAGINAL  Result Date: 02/27/2022 CLINICAL DATA:  Vaginal bleeding EXAM: OBSTETRIC <14 WK [place] AND TRANSVAGINAL OB US TECHNIQUE: Both transabdominal and transvaginal ultrasound examinations were performed for complete evaluation of the gestation as well as the maternal uterus, adnexal regions, and pelvic cul-de-sac. Transvaginal technique was performed to assess early pregnancy. COMPARISON:  None Available. FINDINGS: Intrauterine gestational sac: Single Yolk sac:  Seen Embryo:  Seen Cardiac Activity: Seen Heart Rate: 178 bpm CRL:  15.8 mm   8 w   0 d                  [place] EDC: 10/09/2022 Subchorionic hemorrhage:  None visualized. Maternal uterus/adnexae: There is 1.9 cm hypoechoic structure in the posterior fundus of the uterus suggesting possible small fibroid. IMPRESSION: Single live intrauterine pregnancy is seen. Sonographically estimated gestational age is 66 weeks. Electronically Signed   By: Elmer Picker M.D.   On: 02/27/2022 10:08    Procedures Procedures  Remain on constant cardiac monitoring, normal sinus rhythm, intermittent mild tachycardia in the low 100s Medications Ordered in ED Medications  metroNIDAZOLE (FLAGYL) tablet 500 mg (500 mg Oral Given 02/27/22 0955)  fosfomycin (MONUROL) packet 3 g (3 g Oral Given 02/27/22 0955)    ED Course/ Medical Decision Making/ A&P Clinical Course as of 02/27/22 1101  Tue Feb 27, 2022  0847 Patient's labs remarkable for stable hemoglobin 12.3 that does not require transfusion.  Normal platelet count 385.  She is normal creatinine 0.6.  Her UA has moderate hemoglobin with positive nitrite  suggestive of possible UTI in pregnancy with 11-20 RBC, 21-50 WBC, 11-20 squames with many bacteria.  She is denying UTI symptoms with a normal white blood cell count 7.5 however with bacteriuria in pregnancy we will give dose of fosfomycin. [VB]  [id number] Clue cells present suggestive of BV in pregnancy, will treat with Flagyl.  hCG level 107, 149. [VB]  7824 Patient's TVUS resulted with evidence of a single live IUP at 8 weeks consistent with her last menstrual period.  No subchorionic hematoma. [VB]  2353 Discharging patient with information for OB/GYN follow-up and advised her to make an appointment for first trimester pregnancy.  Provided with 7-day course of Flagyl for BV in pregnancy and she was given 1 dose of fosfomycin in the ER for bacteria in pregnancy.  Strict return precaution discussed.  Safe for discharge home. [VB]    Clinical Course User Index [VB] Cheryl Congo, MD                           Medical Decision Making [name] is a 36 y.o. female. 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  Based on exam and presentation, consider threatened miscarriage versus ectopic versus subchorionic hematoma. Her blood type is B positive blood type, no rhogam required.  Her hemoglobin is 12.3 and stable, no transfusion required.  UA with evidence of bacteriuria in pregnancy although asymptomatic as well as clue cells present on wet prep suggestive of BV in pregnancy.  TV Ultrasound obtained which showed 8-week live IUP.  No subchorionic hematoma.  She was given fosfomycin for asymptomatic bacteriuria in pregnancy and discharged with 7-day course of Flagyl for BV in pregnancy.  Provided with OB/GYN information to make follow-up appointment.  Strict return precaution discussed.  Safe for discharge home.  Amount and/or Complexity of Data Reviewed Labs: ordered. Radiology: ordered.  Risk Prescription drug  management.    Final Clinical Impression(s) / ED Diagnoses Final diagnoses:  Vaginal bleeding in pregnancy  Bacteriuria in pregnancy  Bacterial vaginosis in pregnancy  First trimester pregnancy    Rx / DC Orders ED Discharge Orders          Ordered    metroNIDAZOLE (FLAGYL) 500 MG tablet  2 times daily        02/27/22 1056              [facility] Congo, MD 02/27/22 1101

## 2022-02-27 NOTE — ED Notes (Signed)
ED Provider at bedside. 

## 2022-02-28 LAB — GC/CHLAMYDIA PROBE AMP (~~LOC~~) NOT AT ARMC
Chlamydia: NEGATIVE
Comment: NEGATIVE
Comment: NORMAL
Neisseria Gonorrhea: NEGATIVE

## 2022-03-12 ENCOUNTER — Other Ambulatory Visit: Payer: Self-pay

## 2022-03-12 ENCOUNTER — Encounter (HOSPITAL_BASED_OUTPATIENT_CLINIC_OR_DEPARTMENT_OTHER): Payer: Self-pay

## 2022-03-12 ENCOUNTER — Emergency Department (HOSPITAL_BASED_OUTPATIENT_CLINIC_OR_DEPARTMENT_OTHER): Payer: 59

## 2022-03-12 ENCOUNTER — Emergency Department (HOSPITAL_BASED_OUTPATIENT_CLINIC_OR_DEPARTMENT_OTHER)
Admission: EM | Admit: 2022-03-12 | Discharge: 2022-03-12 | Disposition: A | Discharge status: Home / Self Care | Payer: 59 | Attending: Emergency Medicine | Admitting: Emergency Medicine

## 2022-03-12 DIAGNOSIS — O209 Hemorrhage in early pregnancy, unspecified: Secondary | ICD-10-CM | POA: Diagnosis not present

## 2022-03-12 DIAGNOSIS — O4690 Antepartum hemorrhage, unspecified, unspecified trimester: Secondary | ICD-10-CM

## 2022-03-12 DIAGNOSIS — Z3A10 10 weeks gestation of pregnancy: Secondary | ICD-10-CM | POA: Diagnosis not present

## 2022-03-12 LAB — URINALYSIS, ROUTINE W REFLEX MICROSCOPIC
Bilirubin Urine: NEGATIVE
Glucose, UA: NEGATIVE mg/dL
Hgb urine dipstick: NEGATIVE
Ketones, ur: NEGATIVE mg/dL
Nitrite: POSITIVE — AB
Protein, ur: NEGATIVE mg/dL
Specific Gravity, Urine: >=1.03 (ref 1.005–1.030)
pH: 6.5 (ref 5.0–8.0)

## 2022-03-12 LAB — CBC WITH DIFFERENTIAL/PLATELET
Abs Immature Granulocytes: 0.01 10*3/uL (ref 0.00–0.07)
Basophils Absolute: 0 10*3/uL (ref 0.0–0.1)
Basophils Relative: 1 %
Eosinophils Absolute: 0.1 10*3/uL (ref 0.0–0.5)
Eosinophils Relative: 2 %
HCT: 33.5 % — ABNORMAL LOW (ref 36.0–46.0)
Hemoglobin: 11.5 g/dL — ABNORMAL LOW (ref 12.0–15.0)
Immature Granulocytes: 0 %
Lymphocytes Relative: 27 %
Lymphs Abs: 1.5 10*3/uL (ref 0.7–4.0)
MCH: 26.1 pg (ref 26.0–34.0)
MCHC: 34.3 g/dL (ref 30.0–36.0)
MCV: 76.1 fL — ABNORMAL LOW (ref 80.0–100.0)
Monocytes Absolute: 0.4 10*3/uL (ref 0.1–1.0)
Monocytes Relative: 7 %
Neutro Abs: 3.6 10*3/uL (ref 1.7–7.7)
Neutrophils Relative %: 63 %
Platelets: 402 10*3/uL — ABNORMAL HIGH (ref 150–400)
RBC: 4.4 MIL/uL (ref 3.87–5.11)
RDW: 14.4 % (ref 11.5–15.5)
WBC: 5.7 10*3/uL (ref 4.0–10.5)
nRBC: 0 % (ref 0.0–0.2)

## 2022-03-12 LAB — COMPREHENSIVE METABOLIC PANEL
ALT: 34 U/L (ref 0–44)
AST: 26 U/L (ref 15–41)
Albumin: 4 g/dL (ref 3.5–5.0)
Alkaline Phosphatase: 27 U/L — ABNORMAL LOW (ref 38–126)
Anion gap: 5 (ref 5–15)
BUN: 9 mg/dL (ref 6–20)
CO2: 24 mmol/L (ref 22–32)
Calcium: 8.9 mg/dL (ref 8.9–10.3)
Chloride: 106 mmol/L (ref 98–111)
Creatinine, Ser: 0.59 mg/dL (ref 0.44–1.00)
GFR, Estimated: >60 mL/min (ref >60)
Glucose, Bld: 96 mg/dL (ref 70–99)
Potassium: 3.9 mmol/L (ref 3.5–5.1)
Sodium: 135 mmol/L (ref 135–145)
Total Bilirubin: 0.7 mg/dL (ref 0.3–1.2)
Total Protein: 6.8 g/dL (ref 6.5–8.1)

## 2022-03-12 LAB — URINALYSIS, MICROSCOPIC (REFLEX)

## 2022-03-12 LAB — HCG, QUANTITATIVE, PREGNANCY: hCG, Beta Chain, Quant, S: 126247 m[IU]/mL — ABNORMAL HIGH (ref <5)

## 2022-03-12 LAB — ABO/RH: ABO/RH(D): B POS

## 2022-03-12 MED ORDER — CEFDINIR 300 MG PO CAPS
300.0000 mg | ORAL_CAPSULE | Freq: Once | ORAL | Status: AC
  Administered 2022-03-12: 300 mg via ORAL
  Filled 2022-03-12: qty 1

## 2022-03-12 MED ORDER — CEFDINIR 300 MG PO CAPS: 300.0000 mg | ORAL_CAPSULE | Freq: Two times a day (BID) | ORAL | 0 refills | Status: AC

## 2022-03-12 NOTE — ED Notes (Signed)
IV removed.

## 2022-03-12 NOTE — Discharge Instructions (Addendum)
It was a pleasure take care of you today!  Your labs overall looked unremarkable today.  Your hCG hormone levels are continuing to trend upwards.  It is important that you call your OB/GYN office today to set up a follow-up appointment this week regarding today's ED visit.  For future visits you may follow-up with the [facility]) at [facility] where you will have direct access and care by an OB/GYN specialist and/or midwife.  Your urine showed concerns for an UTI today.  You will be sent a prescription for cefdinir, take as prescribed.  Return to the emergency department for experiencing increasing/worsening symptoms.

## 2022-03-12 NOTE — ED Triage Notes (Signed)
Pt is [redacted] weeks pregnant, started having some vaginal bleeding last night, passing clots last night, minimal bleeding this morning. Abdominal pressure. States had bleeding during last pregnancy with son.  G4P2, 1 miscarriage.

## 2022-03-12 NOTE — ED Provider Notes (Signed)
[facility] EMERGENCY DEPARTMENT Provider Note   CSN: [id number] Arrival date & time: 03/12/22  0932     History  Chief Complaint  Patient presents with   Vaginal Bleeding    [name] is a 36 y.o. female [phone] who presents to the ED complaining of vaginal bleeding onset this morning. Seen previously for same. No meds tried PTA. She has an appointment with her OBGYN on 03/23/22. Denies urinary symptoms, vaginal discharge, vomiting, fever.   Per patient chart review: Patient was evaluated in the emergency department on 02/27/2022 for similar symptoms.  At that time had an ultrasound that noted a live IUP with issues.  Patient was treated for bacterial vaginosis with Flagyl and also treated for UTI with fosfomycin in the ED.  The history is provided by the patient. No language interpreter was used.       Home Medications Prior to Admission medications   Medication Sig Start Date End Date Taking? Authorizing Provider  cefdinir (OMNICEF) 300 MG capsule Take 1 capsule (300 mg total) by mouth 2 (two) times daily for 5 days. 03/12/22 03/17/22 Yes Paradise Vensel A, PA-C  benzonatate (TESSALON) 100 MG capsule Take 1 capsule (100 mg total) by mouth every 8 (eight) hours. 08/23/16   Etta Quill, NP  diphenhydrAMINE (BENADRYL) 25 MG tablet Take 1 tablet (25 mg total) by mouth every 6 (six) hours. 08/19/15   Nona Dell, PA-C  diphenhydrAMINE (BENADRYL) 25 MG tablet Take 1 tablet (25 mg total) by mouth every 6 (six) hours. 08/19/15   Nona Dell, PA-C  metroNIDAZOLE (FLAGYL) 500 MG tablet Take 1 tablet (500 mg total) by mouth 2 (two) times daily. One po bid x 7 days 08/23/16   Etta Quill, NP  metroNIDAZOLE (FLAGYL) 500 MG tablet Take 1 tablet (500 mg total) by mouth 2 (two) times daily. 02/27/22   Elgie Congo, MD      Allergies    Penicillins    Review of Systems   Review of Systems  Constitutional:  Negative for fever.  Gastrointestinal:   Positive for nausea. Negative for vomiting.  Genitourinary:  Positive for vaginal bleeding. Negative for dysuria, hematuria and vaginal discharge.  All other systems reviewed and are negative.   Physical Exam Updated Vital Signs BP 130/80 (BP Location: Right Arm)   Pulse 78   Temp 98.3 F (36.8 C)   Resp 16   Ht '5\' 4"'$  (1.626 m)   Wt 59 kg   LMP 01/02/2022   SpO2 100%   BMI 22.31 kg/m  Physical Exam Vitals and nursing note reviewed. Exam conducted with a chaperone present.  Constitutional:      General: She is not in acute distress.    Appearance: Normal appearance. She is not ill-appearing, toxic-appearing or diaphoretic.  HENT:     Head: Normocephalic and atraumatic.     Right Ear: External ear normal.     Left Ear: External ear normal.     Mouth/Throat:     Pharynx: No oropharyngeal exudate.  Eyes:     General: No scleral icterus.    Extraocular Movements: Extraocular movements intact.     Conjunctiva/sclera: Conjunctivae normal.  Cardiovascular:     Rate and Rhythm: Normal rate and regular rhythm.     Pulses: Normal pulses.     Heart sounds: Normal heart sounds.  Pulmonary:     Effort: Pulmonary effort is normal. No respiratory distress.     Breath sounds: Normal breath sounds. No wheezing.  Abdominal:     General: Abdomen is flat. Bowel sounds are normal. There is no distension.     Palpations: Abdomen is soft. There is no mass.     Tenderness: There is no abdominal tenderness. There is no guarding or rebound.     Hernia: There is no hernia in the left inguinal area or right inguinal area.  Genitourinary:    Pubic Area: No rash.      Labia:        Right: No rash, tenderness, lesion or injury.        Left: No rash, tenderness, lesion or injury.      Vagina: No signs of injury and foreign body. Vaginal discharge present. No erythema, tenderness or bleeding.     Cervix: Normal.     Uterus: Normal. Not deviated, not enlarged, not fixed and not tender.      Adnexa:  Right adnexa normal and left adnexa normal.     Comments: RN chaperone present for exam. Vaginal bleeding noted to vaginal vault and pooling in the fornix. Musculoskeletal:        General: Normal range of motion.     Cervical back: Normal range of motion and neck supple.  Lymphadenopathy:     Lower Body: No right inguinal adenopathy. No left inguinal adenopathy.  Skin:    General: Skin is warm and dry.  Neurological:     Mental Status: She is alert.  Psychiatric:        Behavior: Behavior normal.     ED Results / Procedures / Treatments   Labs (all labs ordered are listed, but only abnormal results are displayed) Labs Reviewed  COMPREHENSIVE METABOLIC PANEL - Abnormal; Notable for the following components:      Result Value   Alkaline Phosphatase 27 (*)    All other components within normal limits  CBC WITH DIFFERENTIAL/PLATELET - Abnormal; Notable for the following components:   Hemoglobin 11.5 (*)    HCT 33.5 (*)    MCV 76.1 (*)    Platelets 402 (*)    All other components within normal limits  HCG, QUANTITATIVE, PREGNANCY - Abnormal; Notable for the following components:   hCG, Beta Chain, Quant, S 126,247 (*)    All other components within normal limits  URINALYSIS, ROUTINE W REFLEX MICROSCOPIC - Abnormal; Notable for the following components:   APPearance HAZY (*)    Nitrite POSITIVE (*)    Leukocytes,Ua TRACE (*)    All other components within normal limits  URINALYSIS, MICROSCOPIC (REFLEX) - Abnormal; Notable for the following components:   Bacteria, UA MANY (*)    All other components within normal limits  ABO/RH    EKG None  Radiology US OB Limited  Result Date: 03/12/2022 CLINICAL DATA:  Vaginal bleeding, first trimester pregnancy EXAM: OBSTETRIC <14 WK ULTRASOUND TECHNIQUE: Transabdominal ultrasound was performed for evaluation of the gestation as well as the maternal uterus and adnexal regions. COMPARISON:  None Available. FINDINGS: Intrauterine  gestational sac: Present Yolk sac:  Present Embryo:  Present Cardiac Activity: Present Heart Rate: 168 bpm CRL:   32 mm   10 w 1 d                  [place] EDC: 10/07/2022 Subchorionic hemorrhage:  Moderate Maternal uterus/adnexae: Maternal right ovary not well seen. Left ovary normal. IMPRESSION: 1. Single living intrauterine pregnancy measuring 10 weeks 1 day gestation. 2. Moderate amount of subchorionic hemorrhage. Electronically Signed   By: Cindra Eves.D.  On: 03/12/2022 12:38    Procedures Procedures    Medications Ordered in ED Medications  cefdinir (OMNICEF) capsule 300 mg (300 mg Oral Given 03/12/22 1404)    ED Course/ Medical Decision Making/ A&P Clinical Course as of 03/12/22 1811  Mon Mar 12, 2022  1321 Consult with Dr. Ilda Basset, who recommends that patient follow-up with her OB/GYN specialist this week.  Also recommends that patient present to [facility] for future visits. [SB]  2993 Discussed with patient and spouse at bedside regarding discharge treatment plan.  Answered all available questions.  Patient appears safe for discharge at this time. [SB]  1354 Consult with pharmacist regarding patient's allergy and need for treatment for UTI in the first trimester.  Pharmacist notes that patient could be treated with cefdinir as it is not as closely related to penicillin.  We will trial patient with first dose of cefdinir here prior to discharge. [SB]  1452 Patient tolerated cefdinir well without acute allergies or abnormalities.  Patient appears safe for discharge at this time. [SB]    Clinical Course User Index [SB] Riley Papin A, PA-C                           Medical Decision Making Amount and/or Complexity of Data Reviewed Labs: ordered. Radiology: ordered.  Risk Prescription drug management.   Patient is a [id number] currently [id number] to the ED with concerns for vaginal bleeding onset this morning.  Notes that she was passing clots last night with bleeding noted this  morning.  Has pressure to the lower abdominal region.  Notes that she had bleeding with her last pregnancy.  Denies concerns for STIs at this time.  Was recently evaluated in the emergency department for similar symptoms.  Has a follow-up appoint with OB/GYN specialist on October 6.  On exam patient with RN chaperone present for exam. Vaginal bleeding noted to vaginal vault and pooling in the fornix.  Unable to visualize cervical os due to patient with pain and bleeding during pelvic exam.  No obvious products of conception noted on speculum exam. Otherwise no acute cardiovascular, respiratory exam findings.  Differential diagnosis includes ectopic pregnancy, miscarriage, abnormal uterine bleeding, implantation bleeding.     Additional history obtained:  External records from outside source obtained and reviewed including: Patient was evaluated in the emergency department on 02/27/2022 for similar symptoms.  At that time had ultrasound completed that showed IUP.  Was also treated for bacterial vaginosis as well as acute cystitis.   Labs:  I ordered, and personally interpreted labs.  The pertinent results include:   hCG uptrending from previous values at126, 247.  CBC without leukocytosis, hemoglobin slightly downtrending from previous value CMP unremarkable Urinalysis with concerns for acute cystitis at this time  Imaging: I ordered imaging studies including Ultrasound OB comp  I independently visualized and interpreted imaging which showed:  1. Single living intrauterine pregnancy measuring 10 weeks 1 day  gestation.  2. Moderate amount of subchorionic hemorrhage.   I agree with the radiologist interpretation  Medications:  I ordered medication including Omnicef for antibiotic treatment for UTI I have reviewed the patients home medicines and have made adjustments as needed  Consultations: I requested consultation with the OB-GYN, Dr. PIlda Bassetand discussed lab and imaging findings as well as  pertinent plan - they recommend: Follow-up with patient's OB/GYN this week.  Also recommends patient follow-up with [facility] in the future.   Disposition: Presentation suspicious for vaginal  bleeding in pregnancy.  IUP noted on ultrasound with moderate subchorionic hemorrhage noted.  Doubt ectopic pregnancy.  At this time doubt miscarriage.  After consideration of the diagnostic results and the patients response to treatment, I feel that the patient would benefit from Discharge home. Discussed lab and imaging findings with patient at bedside. Answered all questions at bedside.  Patient instructed to call her OB/GYN specialist to set up a follow-up appointment this week for follow-up.  Patient sent with a prescription for Omnicef due to UTI.  Patient agreeable to discharge treatment plan.  Supportive care measures and strict return precautions discussed with patient at bedside.  Patient appears safe for discharge at this time.  Follow-up as indicated in discharge paperwork.   This chart was dictated using voice recognition software, Dragon. Despite the best efforts of this provider to proofread and correct errors, errors may still occur which can change documentation meaning.  Final Clinical Impression(s) / ED Diagnoses Final diagnoses:  Vaginal bleeding in pregnancy    Rx / DC Orders ED Discharge Orders          Ordered    cefdinir (OMNICEF) 300 MG capsule  2 times daily        03/12/22 1451              Aidel Davisson A, PA-C 03/12/22 1811    Tegeler, Gwenyth Allegra, MD 03/13/22 [phone]

## 2022-03-23 ENCOUNTER — Ambulatory Visit (INDEPENDENT_AMBULATORY_CARE_PROVIDER_SITE_OTHER): Payer: 59 | Admitting: Obstetrics and Gynecology

## 2022-03-23 ENCOUNTER — Other Ambulatory Visit (HOSPITAL_COMMUNITY)
Admission: RE | Admit: 2022-03-23 | Discharge: 2022-03-23 | Disposition: A | Discharge status: Home / Self Care | Payer: 59 | Source: Ambulatory Visit | Attending: Obstetrics and Gynecology | Admitting: Obstetrics and Gynecology

## 2022-03-23 ENCOUNTER — Encounter: Payer: Self-pay | Admitting: Obstetrics and Gynecology

## 2022-03-23 VITALS — BP 115/79 | HR 86 | Wt 126.1 lb

## 2022-03-23 DIAGNOSIS — O2341 Unspecified infection of urinary tract in pregnancy, first trimester: Secondary | ICD-10-CM

## 2022-03-23 DIAGNOSIS — O468X1 Other antepartum hemorrhage, first trimester: Secondary | ICD-10-CM

## 2022-03-23 DIAGNOSIS — R87612 Low grade squamous intraepithelial lesion on cytologic smear of cervix (LGSIL): Secondary | ICD-10-CM

## 2022-03-23 DIAGNOSIS — N879 Dysplasia of cervix uteri, unspecified: Secondary | ICD-10-CM | POA: Insufficient documentation

## 2022-03-23 DIAGNOSIS — O09899 Supervision of other high risk pregnancies, unspecified trimester: Secondary | ICD-10-CM

## 2022-03-23 DIAGNOSIS — Z8759 Personal history of other complications of pregnancy, childbirth and the puerperium: Secondary | ICD-10-CM | POA: Diagnosis not present

## 2022-03-23 DIAGNOSIS — O09521 Supervision of elderly multigravida, first trimester: Secondary | ICD-10-CM | POA: Diagnosis not present

## 2022-03-23 DIAGNOSIS — O09891 Supervision of other high risk pregnancies, first trimester: Secondary | ICD-10-CM

## 2022-03-23 DIAGNOSIS — Z3481 Encounter for supervision of other normal pregnancy, first trimester: Secondary | ICD-10-CM | POA: Diagnosis not present

## 2022-03-23 DIAGNOSIS — Z3A11 11 weeks gestation of pregnancy: Secondary | ICD-10-CM

## 2022-03-23 DIAGNOSIS — O418X11 Other specified disorders of amniotic fluid and membranes, first trimester, fetus 1: Secondary | ICD-10-CM

## 2022-03-23 DIAGNOSIS — O0990 Supervision of high risk pregnancy, unspecified, unspecified trimester: Secondary | ICD-10-CM | POA: Insufficient documentation

## 2022-03-23 DIAGNOSIS — O288 Other abnormal findings on antenatal screening of mother: Secondary | ICD-10-CM | POA: Diagnosis not present

## 2022-03-23 DIAGNOSIS — Z3480 Encounter for supervision of other normal pregnancy, unspecified trimester: Secondary | ICD-10-CM | POA: Insufficient documentation

## 2022-03-23 DIAGNOSIS — O0991 Supervision of high risk pregnancy, unspecified, first trimester: Secondary | ICD-10-CM | POA: Insufficient documentation

## 2022-03-23 NOTE — Progress Notes (Signed)
Unsure of last pap, hx of abnormal pap

## 2022-03-24 ENCOUNTER — Encounter: Payer: Self-pay | Admitting: Obstetrics and Gynecology

## 2022-03-24 DIAGNOSIS — O09899 Supervision of other high risk pregnancies, unspecified trimester: Secondary | ICD-10-CM | POA: Insufficient documentation

## 2022-03-24 DIAGNOSIS — O09523 Supervision of elderly multigravida, third trimester: Secondary | ICD-10-CM | POA: Insufficient documentation

## 2022-03-24 DIAGNOSIS — O09521 Supervision of elderly multigravida, first trimester: Secondary | ICD-10-CM | POA: Insufficient documentation

## 2022-03-24 DIAGNOSIS — Z8759 Personal history of other complications of pregnancy, childbirth and the puerperium: Secondary | ICD-10-CM | POA: Insufficient documentation

## 2022-03-24 DIAGNOSIS — O418X10 Other specified disorders of amniotic fluid and membranes, first trimester, not applicable or unspecified: Secondary | ICD-10-CM | POA: Insufficient documentation

## 2022-03-24 LAB — PROTEIN / CREATININE RATIO, URINE
Creatinine, Urine: 169 mg/dL (ref 20–275)
Protein/Creat Ratio: 142 mg/g creat (ref 24–184)
Protein/Creatinine Ratio: 0.142 mg/mg creat (ref 0.024–0.184)
Total Protein, Urine: 24 mg/dL (ref 5–24)

## 2022-03-24 MED ORDER — ASPIRIN 81 MG PO CHEW: 81.0000 mg | CHEWABLE_TABLET | Freq: Every day | ORAL | 1 refills | Status: DC

## 2022-03-24 MED ORDER — CVS PRENATAL GUMMY 0.18-25 MG PO CHEW: 1.0000 | CHEWABLE_TABLET | Freq: Every day | ORAL | 3 refills | Status: DC

## 2022-03-24 NOTE — Progress Notes (Signed)
History:   [name] is a 36 y.o. [id number] at 2w4dby LMP being seen today for her first obstetrical visit.  Her obstetrical history is significant for pregnancy induced hypertension and previous C/s . + subchorionic hemorrhage found on [place] Patient does intend to breast feed. Pregnancy history fully reviewed.   Patient reports continue pink vaginal discharge. Moderate subchorionic hemorrhage noted on [place].   Had URI in 2017 with documented elevated BP's, however normal BP's in other settings of hospital/ office visits.   Elevated BP's during labor and PP in both deliveries in 2009 & 2016  HISTORY: OB History  Gravida Para Term Preterm AB Living  5 2 1 1 2 2   SAB IAB Ectopic Multiple Live Births  1 1 0 0 2    # Outcome Date GA Lbr Len/2nd Weight Sex Delivery Anes PTL Lv  5 Current           4 Preterm 02/02/15 [id number] lb 8.9 oz (2.52 kg) M CS-LVertical Spinal  LIV     Name: [name]   Apgar1: 8  Apgar5: 9  3 SAB 2012          2 Term 2009    F CS-LTranv   LIV  1 IAB 2003            Last pap smear was done unknown.   Past Medical History:  Diagnosis Date   Bacterial infection    History of chicken pox    Past Surgical History:  Procedure Laterality Date   CESAREAN SECTION     CESAREAN SECTION N/A 02/02/2015   Procedure: CESAREAN SECTION;  Surgeon: EvThurnell LoseMD;  [facility];  Service: Obstetrics;  Laterality: N/A;   DILATION AND CURETTAGE OF UTERUS     WISDOM TOOTH EXTRACTION     No family history on file. Social History   Tobacco Use   Smoking status: Never   Smokeless tobacco: Never  Substance Use Topics   Alcohol use: No   Drug use: No   Allergies  Allergen Reactions   Penicillins Rash   Current Outpatient Medications on File Prior to Visit  Medication Sig Dispense Refill   Prenatal Vit-Fe Fumarate-FA (PRENATAL MULTIVITAMIN) TABS tablet Take 1 tablet by mouth daily at 12 noon.     No current facility-administered  medications on file prior to visit.    Review of Systems Pertinent items noted in HPI and remainder of comprehensive ROS otherwise negative.  Indications for ASA therapy (per UpToDate) One of the following: Previous pregnancy with preeclampsia, especially early onset and with an adverse outcome No Multifetal gestation No Chronic hypertension No Type 1 or 2 diabetes mellitus No Chronic kidney disease No Autoimmune disease (antiphospholipid syndrome, systemic lupus erythematosus) No Two or more of the following: Nulliparity No Obesity (body mass index >30 kg/m2) No Family history of preeclampsia in mother or sister No Age ?35 years Yes Sociodemographic characteristics (African American race, low socioeconomic level) No Personal risk factors (eg, previous pregnancy with low birth weight or small for gestational age infant, previous adverse pregnancy outcome [eg, stillbirth], interval >10 years between pregnancies) No    Physical Exam:   Vitals:   03/23/22 0939  BP: 115/79  Pulse: 86  Weight: 126 lb 1.9 oz (57.2 kg)   Fetal Heart Rate (bpm): 172  Uterine size:   Active fetus noted [place].  Patient informed that the ultrasound is considered a limited obstetric ultrasound and is  not intended to be a complete ultrasound exam.  Patient also informed that the ultrasound is not being completed with the intent of assessing for fetal or placental anomalies or any pelvic abnormalities.  Explained that the purpose of today's ultrasound is to assess for fetal heart rate.  Patient acknowledges the purpose of the exam and the limitations of the study. General: well-developed, well-nourished female in no acute distress  Breasts:  normal appearance, no masses or tenderness bilaterally, exam done in the presence of a chaperone.   Skin: normal coloration and turgor, no rashes  Neurologic: oriented, normal, negative, normal mood  Extremities: normal strength, tone, and muscle mass, ROM of all  joints is normal  HEENT PERRLA, extraocular movement intact and sclera clear, anicteric  Neck supple and no masses  Cardiovascular: regular rate and rhythm  Respiratory:  no respiratory distress, normal breath sounds  Abdomen: soft, non-tender; bowel sounds normal; no masses,  no organomegaly  Pelvic: normal external genitalia, no lesions, normal vaginal mucosa, normal vaginal discharge, normal cervix, pap smear done. Small amount of pink vaginal discharge noted in vagina.  Exam done in the presence of a chaperone.     Assessment:    Pregnancy: [id number] Patient Active Problem List   Diagnosis Date Noted   History of gestational hypertension 03/24/2022   Subchorionic hematoma in first trimester 03/24/2022   Multigravida of advanced maternal age in first trimester 03/24/2022   History of preterm delivery, currently pregnant 03/24/2022   Supervision of other normal pregnancy, antepartum 03/23/2022   Status post repeat low transverse cesarean section 02/03/2015   ASCUS favor benign 12/18/2014     Plan:   1. Supervision of other normal pregnancy, antepartum  - Culture, OB Urine - Cytology - PAP - Hepatitis C antibody - HIV Antibody (routine testing w rflx) - Obstetric panel - HORIZON CUSTOM - PANORAMA PRENATAL TEST FULL PANEL - Protein / creatinine ratio, urine - Comp Met (CMET)  2. History of gestational hypertension  Start BASA, RX sent Baseline PRE E labs collected.  BP normal.    3. Subchorionic hematoma in first trimester, fetus 1 of multiple gestation  Bleeding precautions Pelvic rest Go to MAU if pain/ Bleeding    4. Multigravida of advanced maternal age in first trimester  Growth MFM [place] At 19 weeks.  39-40 delivery per MFM guidelines.   5. History of preterm delivery, currently pregnant  Went into labor at 70 weeks. Attempted a TOLAC. Patient had 1 pop off with vacuum and then requested a repeat C/s.  Referral to MFM     Initial labs drawn. Continue  prenatal vitamins. Problem list reviewed and updated. Genetic Screening discussed, Panorama and Horizon: requested. Ultrasound discussed; fetal anatomic survey: planned. Anticipatory guidance about prenatal visits given including labs, ultrasounds, and testing. Weight gain recommendations per IOM guidelines reviewed: underweight/BMI 18.5 or less > 28 - 40 lbs; normal weight/BMI 18.5 - 24.9 > 25 - 35 lbs; overweight/BMI 25 - 29.9 > 15 - 25 lbs; obese/BMI  30 or more > 11 - 20 lbs. Discussed usage of the Babyscripts app for more information about pregnancy, and to track blood pressures. Also discussed usage of virtual visits as additional source of managing and completing prenatal visits.  Patient was encouraged to use MyChart to review results, send requests, and have questions addressed.   The nature of [place] for [facility] with multiple MDs and Advanced Practice Providers was explained to patient; also emphasized that residents, students are  part of our team. Routine obstetric precautions reviewed. Encouraged to seek out care at our office or emergency room [facility] MAU preferred) for urgent and/or emergent concerns. No follow-ups on file.       Caniya Tagle, Artist Pais, [place] for [employer], [place]

## 2022-03-26 LAB — CULTURE, OB URINE

## 2022-03-26 LAB — URINE CULTURE, OB REFLEX

## 2022-03-28 ENCOUNTER — Telehealth: Payer: Self-pay

## 2022-03-28 ENCOUNTER — Other Ambulatory Visit: Payer: Self-pay

## 2022-03-28 NOTE — Telephone Encounter (Signed)
Pt called stating she started bleeding this morning and is passing clots. Pt was advised to go to MAU for evaluation. Pt expressed understanding.

## 2022-03-30 ENCOUNTER — Inpatient Hospital Stay (HOSPITAL_COMMUNITY)
Admission: AD | Admit: 2022-03-30 | Discharge: 2022-03-31 | Disposition: A | Discharge status: Home / Self Care | Payer: 59 | Attending: Family Medicine | Admitting: Family Medicine

## 2022-03-30 DIAGNOSIS — O418X10 Other specified disorders of amniotic fluid and membranes, first trimester, not applicable or unspecified: Secondary | ICD-10-CM

## 2022-03-30 DIAGNOSIS — Z6790 Unspecified blood type, Rh positive: Secondary | ICD-10-CM | POA: Insufficient documentation

## 2022-03-30 DIAGNOSIS — O468X1 Other antepartum hemorrhage, first trimester: Secondary | ICD-10-CM | POA: Diagnosis not present

## 2022-03-30 DIAGNOSIS — Z3A12 12 weeks gestation of pregnancy: Secondary | ICD-10-CM | POA: Insufficient documentation

## 2022-03-30 NOTE — Discharge Instructions (Signed)

## 2022-03-30 NOTE — MAU Provider Note (Signed)
Event Date/Time   First Provider Initiated Contact with Patient 03/30/22 2341      S Ms. [name] is a 36 y.o. [id number] patient who presents to MAU today with complaint of vaginal bleeding. Patient states she was in the shower around 5PM and noticed some bright red blood. Patient got out of the shower and put on a pad and felt another gush. Patient changed pad and has had current pad on for 4 hours that she shows to the provider with minimal bleeding. Patient denies any pain at the time of her shower or since except for "maybe some mild cramping". Pt has documented [facility] on [place] dated 03/12/2022, rated as moderate. Patient last had intercourse on Sunday.  O BP 116/84 (BP Location: Right Arm)   Pulse 79   Temp 98 F (36.7 C) (Oral)   Ht '5\' 4"'$  (1.626 m)   Wt 58.8 kg   LMP 01/02/2022   BMI 22.26 kg/m   Patient Vitals for the past 24 hrs:  BP Temp Temp src Pulse Height Weight  03/30/22 2331 116/84 98 F (36.7 C) Oral 79 '5\' 4"'$  (1.626 m) 58.8 kg   Physical Exam Vitals and nursing note reviewed.  Constitutional:      General: She is not in acute distress.    Appearance: Normal appearance. She is not ill-appearing, toxic-appearing or diaphoretic.  HENT:     Head: Normocephalic and atraumatic.  Pulmonary:     Effort: Pulmonary effort is normal.  Abdominal:     Palpations: Abdomen is soft.  Skin:    General: Skin is warm and dry.  Neurological:     Mental Status: She is alert and oriented to person, place, and time.  Psychiatric:        Mood and Affect: Mood normal.        Behavior: Behavior normal.        Thought Content: Thought content normal.        Judgment: Judgment normal.     A Medical screening exam complete [facility] Pt informed that the ultrasound is considered a limited OB ultrasound and is not intended to be a complete ultrasound exam.  Patient also informed that the ultrasound is not being completed with the intent of assessing for fetal or placental anomalies or any  pelvic abnormalities.  Explained that the purpose of today's ultrasound is to assess for  viability (FHR 160).  Patient acknowledges the purpose of the exam and the limitations of the study.    P Discharge from MAU in stable condition Discussed expected s/sx of [facility] and when to return to MAU F/U with OB as planned Warning signs for worsening condition that would warrant emergency follow-up discussed Patient may return to MAU as needed   Benny Henrie, Gerrie Nordmann, NP 03/30/2022 11:57 PM

## 2022-03-30 NOTE — MAU Note (Addendum)
Pt says has had VB all preg  but  At 5 pm- became bright red blood - saw when showered   Pad in triage - small amt red  No cramping/ no pain  Last sex- last Sunday Was at Dr office this am- in [place]- for labs

## 2022-03-30 NOTE — MAU Provider Note (Incomplete)
Event Date/Time   First Provider Initiated Contact with Patient 03/30/22 2341      S Ms. [name] is a 36 y.o. [id number] patient who presents to MAU today with complaint of vaginal bleeding. Patient states she was in the shower    O BP 116/84 (BP Location: Right Arm)   Pulse 79   Temp 98 F (36.7 C) (Oral)   Ht '5\' 4"'$  (1.626 m)   Wt 58.8 kg   LMP 01/02/2022   BMI 22.26 kg/m  Physical Exam  A Medical screening exam {Blank single:19197::"complete","started"} '@DX'$ @  P Discharge from MAU in stable condition Patient given the option of transfer to [facility] for further evaluation or seek care in outpatient facility of choice *** List of options for follow-up given *** Warning signs for worsening condition that would warrant emergency follow-up discussed Patient may return to MAU as needed   Farzana Koci, Gerrie Nordmann, NP 03/30/2022 11:57 PM

## 2022-03-31 DIAGNOSIS — Z6790 Unspecified blood type, Rh positive: Secondary | ICD-10-CM | POA: Diagnosis not present

## 2022-03-31 DIAGNOSIS — Z3A12 12 weeks gestation of pregnancy: Secondary | ICD-10-CM | POA: Diagnosis not present

## 2022-03-31 DIAGNOSIS — O468X1 Other antepartum hemorrhage, first trimester: Secondary | ICD-10-CM | POA: Diagnosis not present

## 2022-03-31 DIAGNOSIS — O418X10 Other specified disorders of amniotic fluid and membranes, first trimester, not applicable or unspecified: Secondary | ICD-10-CM | POA: Diagnosis present

## 2022-04-02 ENCOUNTER — Ambulatory Visit (INDEPENDENT_AMBULATORY_CARE_PROVIDER_SITE_OTHER): Payer: Medicaid Other

## 2022-04-02 DIAGNOSIS — Z3480 Encounter for supervision of other normal pregnancy, unspecified trimester: Secondary | ICD-10-CM

## 2022-04-02 DIAGNOSIS — O2341 Unspecified infection of urinary tract in pregnancy, first trimester: Secondary | ICD-10-CM

## 2022-04-02 DIAGNOSIS — Z3A12 12 weeks gestation of pregnancy: Secondary | ICD-10-CM

## 2022-04-02 LAB — OBSTETRIC PANEL
Absolute Monocytes: 341 cells/uL (ref 200–950)
Antibody Screen: NOT DETECTED
Basophils Absolute: 28 cells/uL (ref 0–200)
Basophils Relative: 0.4 %
Eosinophils Absolute: 121 cells/uL (ref 15–500)
Eosinophils Relative: 1.7 %
HCT: 36.6 % (ref 35.0–45.0)
Hemoglobin: 11.6 g/dL — ABNORMAL LOW (ref 11.7–15.5)
Hepatitis B Surface Ag: NONREACTIVE
Lymphs Abs: 1789 cells/uL (ref 850–3900)
MCH: 25.7 pg — ABNORMAL LOW (ref 27.0–33.0)
MCHC: 31.7 g/dL — ABNORMAL LOW (ref 32.0–36.0)
MCV: 81 fL (ref 80.0–100.0)
MPV: 11.3 fL (ref 7.5–12.5)
Monocytes Relative: 4.8 %
Neutro Abs: 4821 cells/uL (ref 1500–7800)
Neutrophils Relative %: 67.9 %
Platelets: 434 10*3/uL — ABNORMAL HIGH (ref 140–400)
RBC: 4.52 10*6/uL (ref 3.80–5.10)
RDW: 14.3 % (ref 11.0–15.0)
RPR Ser Ql: NONREACTIVE
Rubella: 0.97 Index — ABNORMAL LOW
Total Lymphocyte: 25.2 %
WBC: 7.1 10*3/uL (ref 3.8–10.8)

## 2022-04-02 LAB — HEPATITIS C ANTIBODY: Hepatitis C Ab: NONREACTIVE

## 2022-04-02 LAB — HIV ANTIBODY (ROUTINE TESTING W REFLEX): HIV 1&2 Ab, 4th Generation: NONREACTIVE

## 2022-04-02 NOTE — Progress Notes (Cosign Needed Addendum)
Pt here for repeat urine culture per Noni Saupe, NP. FHT 160.

## 2022-04-05 ENCOUNTER — Encounter (INDEPENDENT_AMBULATORY_CARE_PROVIDER_SITE_OTHER): Payer: Self-pay

## 2022-04-05 LAB — CULTURE, OB URINE

## 2022-04-05 LAB — URINE CULTURE, OB REFLEX

## 2022-04-06 ENCOUNTER — Other Ambulatory Visit: Payer: Self-pay | Admitting: Obstetrics and Gynecology

## 2022-04-06 DIAGNOSIS — N870 Mild cervical dysplasia: Secondary | ICD-10-CM | POA: Insufficient documentation

## 2022-04-06 DIAGNOSIS — O2340 Unspecified infection of urinary tract in pregnancy, unspecified trimester: Secondary | ICD-10-CM | POA: Insufficient documentation

## 2022-04-06 DIAGNOSIS — R87619 Unspecified abnormal cytological findings in specimens from cervix uteri: Secondary | ICD-10-CM | POA: Insufficient documentation

## 2022-04-06 MED ORDER — CEFADROXIL 500 MG PO CAPS: 500.0000 mg | ORAL_CAPSULE | Freq: Two times a day (BID) | ORAL | 0 refills | Status: DC

## 2022-04-08 LAB — PANORAMA PRENATAL TEST FULL PANEL:PANORAMA TEST PLUS 5 ADDITIONAL MICRODELETIONS: FETAL FRACTION: 10.9

## 2022-04-09 LAB — CYTOLOGY - PAP
Chlamydia: NEGATIVE
Comment: NEGATIVE
Comment: NEGATIVE
Comment: NEGATIVE
Comment: NEGATIVE
Comment: NORMAL
HPV 16: NEGATIVE
HPV 18 / 45: NEGATIVE
High risk HPV: POSITIVE — AB
Neisseria Gonorrhea: NEGATIVE

## 2022-04-10 LAB — HORIZON CUSTOM: REPORT SUMMARY: POSITIVE — AB

## 2022-04-16 ENCOUNTER — Encounter: Payer: Medicaid Other | Admitting: Obstetrics & Gynecology

## 2022-04-18 ENCOUNTER — Encounter: Payer: Self-pay | Admitting: *Deleted

## 2022-04-18 ENCOUNTER — Other Ambulatory Visit: Payer: Self-pay | Admitting: *Deleted

## 2022-04-18 DIAGNOSIS — I5020 Unspecified systolic (congestive) heart failure: Secondary | ICD-10-CM | POA: Diagnosis present

## 2022-04-18 DIAGNOSIS — Z87448 Personal history of other diseases of urinary system: Secondary | ICD-10-CM

## 2022-04-18 HISTORY — DX: Personal history of other diseases of urinary system: Z87.448

## 2022-04-18 HISTORY — DX: Unspecified systolic (congestive) heart failure: I50.20

## 2022-04-18 MED ORDER — PROMETHAZINE HCL 25 MG PO TABS: 25.0000 mg | ORAL_TABLET | Freq: Four times a day (QID) | ORAL | 1 refills | Status: DC | PRN

## 2022-04-21 ENCOUNTER — Inpatient Hospital Stay (HOSPITAL_COMMUNITY)
Admission: AD | Admit: 2022-04-21 | Discharge: 2022-04-23 | Disposition: A | Discharge status: Home / Self Care | Payer: 59 | Attending: Obstetrics and Gynecology | Admitting: Obstetrics and Gynecology

## 2022-04-21 DIAGNOSIS — O0991 Supervision of high risk pregnancy, unspecified, first trimester: Secondary | ICD-10-CM

## 2022-04-21 DIAGNOSIS — Z3480 Encounter for supervision of other normal pregnancy, unspecified trimester: Secondary | ICD-10-CM

## 2022-04-21 DIAGNOSIS — Z7982 Long term (current) use of aspirin: Secondary | ICD-10-CM | POA: Insufficient documentation

## 2022-04-21 DIAGNOSIS — O99891 Other specified diseases and conditions complicating pregnancy: Secondary | ICD-10-CM | POA: Insufficient documentation

## 2022-04-21 DIAGNOSIS — O34219 Maternal care for unspecified type scar from previous cesarean delivery: Secondary | ICD-10-CM | POA: Insufficient documentation

## 2022-04-21 DIAGNOSIS — I340 Nonrheumatic mitral (valve) insufficiency: Secondary | ICD-10-CM

## 2022-04-21 DIAGNOSIS — I5020 Unspecified systolic (congestive) heart failure: Secondary | ICD-10-CM | POA: Insufficient documentation

## 2022-04-21 DIAGNOSIS — O09521 Supervision of elderly multigravida, first trimester: Secondary | ICD-10-CM | POA: Diagnosis present

## 2022-04-21 DIAGNOSIS — O2302 Infections of kidney in pregnancy, second trimester: Secondary | ICD-10-CM

## 2022-04-21 DIAGNOSIS — Z20822 Contact with and (suspected) exposure to covid-19: Secondary | ICD-10-CM | POA: Insufficient documentation

## 2022-04-21 DIAGNOSIS — O09523 Supervision of elderly multigravida, third trimester: Secondary | ICD-10-CM | POA: Diagnosis present

## 2022-04-21 DIAGNOSIS — Z3A15 15 weeks gestation of pregnancy: Secondary | ICD-10-CM | POA: Insufficient documentation

## 2022-04-21 DIAGNOSIS — Z880 Allergy status to penicillin: Secondary | ICD-10-CM | POA: Insufficient documentation

## 2022-04-21 DIAGNOSIS — O0990 Supervision of high risk pregnancy, unspecified, unspecified trimester: Secondary | ICD-10-CM

## 2022-04-21 DIAGNOSIS — R079 Chest pain, unspecified: Secondary | ICD-10-CM | POA: Insufficient documentation

## 2022-04-22 ENCOUNTER — Inpatient Hospital Stay (HOSPITAL_COMMUNITY): Payer: 59

## 2022-04-22 ENCOUNTER — Inpatient Hospital Stay (EMERGENCY_DEPARTMENT_HOSPITAL): Payer: 59

## 2022-04-22 ENCOUNTER — Encounter (HOSPITAL_COMMUNITY): Payer: Self-pay | Admitting: Obstetrics & Gynecology

## 2022-04-22 ENCOUNTER — Other Ambulatory Visit: Payer: Self-pay

## 2022-04-22 DIAGNOSIS — I5021 Acute systolic (congestive) heart failure: Secondary | ICD-10-CM | POA: Diagnosis not present

## 2022-04-22 DIAGNOSIS — R079 Chest pain, unspecified: Secondary | ICD-10-CM | POA: Diagnosis not present

## 2022-04-22 DIAGNOSIS — Z880 Allergy status to penicillin: Secondary | ICD-10-CM | POA: Diagnosis not present

## 2022-04-22 DIAGNOSIS — I340 Nonrheumatic mitral (valve) insufficiency: Secondary | ICD-10-CM | POA: Diagnosis not present

## 2022-04-22 DIAGNOSIS — R509 Fever, unspecified: Secondary | ICD-10-CM | POA: Diagnosis present

## 2022-04-22 DIAGNOSIS — R9431 Abnormal electrocardiogram [ECG] [EKG]: Secondary | ICD-10-CM | POA: Diagnosis not present

## 2022-04-22 DIAGNOSIS — Z7982 Long term (current) use of aspirin: Secondary | ICD-10-CM | POA: Diagnosis not present

## 2022-04-22 DIAGNOSIS — Z20822 Contact with and (suspected) exposure to covid-19: Secondary | ICD-10-CM | POA: Diagnosis not present

## 2022-04-22 DIAGNOSIS — I5020 Unspecified systolic (congestive) heart failure: Secondary | ICD-10-CM | POA: Diagnosis not present

## 2022-04-22 DIAGNOSIS — O99891 Other specified diseases and conditions complicating pregnancy: Secondary | ICD-10-CM | POA: Diagnosis not present

## 2022-04-22 DIAGNOSIS — O2302 Infections of kidney in pregnancy, second trimester: Secondary | ICD-10-CM

## 2022-04-22 DIAGNOSIS — Z3A15 15 weeks gestation of pregnancy: Secondary | ICD-10-CM | POA: Diagnosis not present

## 2022-04-22 DIAGNOSIS — O34219 Maternal care for unspecified type scar from previous cesarean delivery: Secondary | ICD-10-CM | POA: Diagnosis not present

## 2022-04-22 HISTORY — DX: Infections of kidney in pregnancy, second trimester: O23.02

## 2022-04-22 LAB — CBC WITH DIFFERENTIAL/PLATELET
Abs Immature Granulocytes: 0.03 10*3/uL (ref 0.00–0.07)
Basophils Absolute: 0 10*3/uL (ref 0.0–0.1)
Basophils Relative: 0 %
Eosinophils Absolute: 0 10*3/uL (ref 0.0–0.5)
Eosinophils Relative: 0 %
HCT: 33.9 % — ABNORMAL LOW (ref 36.0–46.0)
Hemoglobin: 11.3 g/dL — ABNORMAL LOW (ref 12.0–15.0)
Immature Granulocytes: 0 %
Lymphocytes Relative: 6 %
Lymphs Abs: 0.6 10*3/uL — ABNORMAL LOW (ref 0.7–4.0)
MCH: 25.9 pg — ABNORMAL LOW (ref 26.0–34.0)
MCHC: 33.3 g/dL (ref 30.0–36.0)
MCV: 77.8 fL — ABNORMAL LOW (ref 80.0–100.0)
Monocytes Absolute: 0.3 10*3/uL (ref 0.1–1.0)
Monocytes Relative: 3 %
Neutro Abs: 9.2 10*3/uL — ABNORMAL HIGH (ref 1.7–7.7)
Neutrophils Relative %: 91 %
Platelets: 365 10*3/uL (ref 150–400)
RBC: 4.36 MIL/uL (ref 3.87–5.11)
RDW: 14.3 % (ref 11.5–15.5)
WBC: 10.2 10*3/uL (ref 4.0–10.5)
nRBC: 0 % (ref 0.0–0.2)

## 2022-04-22 LAB — COMPREHENSIVE METABOLIC PANEL
ALT: 51 U/L — ABNORMAL HIGH (ref 0–44)
AST: 50 U/L — ABNORMAL HIGH (ref 15–41)
Albumin: 3.3 g/dL — ABNORMAL LOW (ref 3.5–5.0)
Alkaline Phosphatase: 30 U/L — ABNORMAL LOW (ref 38–126)
Anion gap: 9 (ref 5–15)
BUN: 5 mg/dL — ABNORMAL LOW (ref 6–20)
CO2: 19 mmol/L — ABNORMAL LOW (ref 22–32)
Calcium: 9.3 mg/dL (ref 8.9–10.3)
Chloride: 106 mmol/L (ref 98–111)
Creatinine, Ser: 0.61 mg/dL (ref 0.44–1.00)
GFR, Estimated: >60 mL/min (ref >60)
Glucose, Bld: 118 mg/dL — ABNORMAL HIGH (ref 70–99)
Potassium: 3.8 mmol/L (ref 3.5–5.1)
Sodium: 134 mmol/L — ABNORMAL LOW (ref 135–145)
Total Bilirubin: 0.5 mg/dL (ref 0.3–1.2)
Total Protein: 6.7 g/dL (ref 6.5–8.1)

## 2022-04-22 LAB — RESP PANEL BY RT-PCR (FLU A&B, COVID) ARPGX2
Influenza A by PCR: NEGATIVE
Influenza B by PCR: NEGATIVE
SARS Coronavirus 2 by RT PCR: NEGATIVE

## 2022-04-22 LAB — URINALYSIS, ROUTINE W REFLEX MICROSCOPIC
Bilirubin Urine: NEGATIVE
Glucose, UA: 150 mg/dL — AB
Hgb urine dipstick: NEGATIVE
Ketones, ur: 5 mg/dL — AB
Nitrite: POSITIVE — AB
Protein, ur: NEGATIVE mg/dL
Specific Gravity, Urine: 1.008 (ref 1.005–1.030)
pH: 6 (ref 5.0–8.0)

## 2022-04-22 LAB — ECHOCARDIOGRAM COMPLETE
Area-P 1/2: 3.6 cm2
Height: 64 in
MV M vel: 5.15 m/s
MV Peak grad: 106.1 mmHg
Radius: 0.4 cm
S' Lateral: 4.5 cm
Weight: 2048 oz

## 2022-04-22 LAB — CBC
HCT: 28.8 % — ABNORMAL LOW (ref 36.0–46.0)
Hemoglobin: 9.9 g/dL — ABNORMAL LOW (ref 12.0–15.0)
MCH: 26.3 pg (ref 26.0–34.0)
MCHC: 34.4 g/dL (ref 30.0–36.0)
MCV: 76.4 fL — ABNORMAL LOW (ref 80.0–100.0)
Platelets: 297 10*3/uL (ref 150–400)
RBC: 3.77 MIL/uL — ABNORMAL LOW (ref 3.87–5.11)
RDW: 14.3 % (ref 11.5–15.5)
WBC: 5.4 10*3/uL (ref 4.0–10.5)
nRBC: 0 % (ref 0.0–0.2)

## 2022-04-22 LAB — BRAIN NATRIURETIC PEPTIDE: B Natriuretic Peptide: 109.9 pg/mL — ABNORMAL HIGH (ref 0.0–100.0)

## 2022-04-22 LAB — TROPONIN I (HIGH SENSITIVITY)
Troponin I (High Sensitivity): 8 ng/L (ref <18)
Troponin I (High Sensitivity): 9 ng/L (ref <18)

## 2022-04-22 MED ORDER — SODIUM CHLORIDE 0.9% FLUSH
3.0000 mL | Freq: Two times a day (BID) | INTRAVENOUS | Status: DC
  Administered 2022-04-22: 3 mL via INTRAVENOUS

## 2022-04-22 MED ORDER — SODIUM CHLORIDE 0.9 % IV SOLN
2.0000 g | INTRAVENOUS | Status: DC
  Administered 2022-04-22 – 2022-04-23 (×2): 2 g via INTRAVENOUS
  Filled 2022-04-22 (×2): qty 20

## 2022-04-22 MED ORDER — ACETAMINOPHEN 325 MG PO TABS: 650.0000 mg | ORAL_TABLET | ORAL | Status: DC | PRN

## 2022-04-22 MED ORDER — LACTATED RINGERS IV BOLUS
1000.0000 mL | Freq: Once | INTRAVENOUS | Status: AC
  Administered 2022-04-22: 1000 mL via INTRAVENOUS

## 2022-04-22 MED ORDER — ACETAMINOPHEN 500 MG PO TABS
1000.0000 mg | ORAL_TABLET | Freq: Once | ORAL | Status: AC
  Administered 2022-04-22: 1000 mg via ORAL
  Filled 2022-04-22: qty 2

## 2022-04-22 MED ORDER — COMPLETENATE 29-1 MG PO CHEW
1.0000 | CHEWABLE_TABLET | Freq: Every day | ORAL | Status: DC
  Administered 2022-04-22 – 2022-04-23 (×2): 1 via ORAL
  Filled 2022-04-22 (×2): qty 1

## 2022-04-22 MED ORDER — ONDANSETRON HCL 4 MG/2ML IJ SOLN: 4.0000 mg | Freq: Four times a day (QID) | INTRAMUSCULAR | Status: DC | PRN

## 2022-04-22 MED ORDER — METOPROLOL SUCCINATE 12.5 MG HALF TABLET
12.5000 mg | ORAL_TABLET | Freq: Every day | ORAL | Status: DC
  Administered 2022-04-22: 12.5 mg via ORAL
  Filled 2022-04-22: qty 1

## 2022-04-22 MED ORDER — CALCIUM CARBONATE ANTACID 500 MG PO CHEW: 2.0000 | CHEWABLE_TABLET | ORAL | Status: DC | PRN

## 2022-04-22 MED ORDER — SODIUM CHLORIDE 0.9 % IV SOLN
1.0000 g | Freq: Three times a day (TID) | INTRAVENOUS | Status: DC
  Filled 2022-04-22 (×4): qty 5

## 2022-04-22 MED ORDER — LACTATED RINGERS IV SOLN: INTRAVENOUS | Status: DC

## 2022-04-22 MED ORDER — SODIUM CHLORIDE 0.9 % IV SOLN: INTRAVENOUS | Status: DC

## 2022-04-22 MED ORDER — PRENATAL MULTIVITAMIN CH
1.0000 | ORAL_TABLET | Freq: Every day | ORAL | Status: DC
  Filled 2022-04-22: qty 1

## 2022-04-22 MED ORDER — DOCUSATE SODIUM 100 MG PO CAPS
100.0000 mg | ORAL_CAPSULE | Freq: Every day | ORAL | Status: DC
  Administered 2022-04-22: 100 mg via ORAL
  Filled 2022-04-22 (×2): qty 1

## 2022-04-22 MED ORDER — ZOLPIDEM TARTRATE 5 MG PO TABS: 5.0000 mg | ORAL_TABLET | Freq: Every evening | ORAL | Status: DC | PRN

## 2022-04-22 MED ORDER — ONDANSETRON 4 MG PO TBDP: 4.0000 mg | ORAL_TABLET | Freq: Four times a day (QID) | ORAL | Status: DC | PRN

## 2022-04-22 NOTE — Consult Note (Signed)
Cardiology Consultation   Patient ID: [name] MRN: [id number]; DOB: 1986/04/10  Admit date: 04/21/2022 Date of Consult: 04/22/2022  PCP:  Merryl Hacker, No   [place] Providers Cardiologist:  None      Patient Profile:   [name] is a 36 y.o. female with no significant PMH who is being seen 04/22/2022 for the evaluation of systolic heart failure and moderate-to-severe MR at the request of Dr. Rip Harbour.  History of Present Illness:   [name] is a 36 year old [id number] at [redacted]w[redacted]d She presented to the MAU with fever, body aches and SOB found to have possible pyelonephritis prompting admission for IV ABX. On admission, she was noted to by tachycardic and ECG was performed which revealed sinus tachycardia with borderline LVH and inferolateral TWI. Cardiology was called overnight and TTE was recommended which revealed  LVEF 40% with apical lateral hypokinesis, normal RV, mildly thickened and calcified mitral valve leaflet (abnormal for age) but no definitive post-inflammatory changes with moderate to severe MR. Cardiology is now consulted for further management.  Currently, the patient feels much better. States she has chronic dyspnea on exertion that has worsened during this pregnancy. No orthopnea, PND, LE edema, palpitations or significant chest pain. Denies any history of rheumatic fever or serious infections as a child/adult. No IVDU. Had pre-eclampsia at time of delivery with first child but no history of hospitalization for fluid in her lungs or legs. No history of DM or HTN. No significant family history of heart disease.    Past Medical History:  Diagnosis Date   Bacterial infection    History of chicken pox     Past Surgical History:  Procedure Laterality Date   CESAREAN SECTION     CESAREAN SECTION N/A 02/02/2015   Procedure: CESAREAN SECTION;  Surgeon: EThurnell Lose MD;  [facility];  Service: Obstetrics;  Laterality: N/A;   DILATION AND CURETTAGE  OF UTERUS     WISDOM TOOTH EXTRACTION       Home Medications:  Prior to Admission medications   Medication Sig Start Date End Date Taking? Authorizing Provider  Prenatal MV & Min w/FA-DHA ([facility] PRENATAL GUMMY) 0.18-25 MG CHEW Chew 1 tablet by mouth daily. 03/24/22  Yes Rasch, JArtist Pais NP  aspirin (ASPIRIN CHILDRENS) 81 MG chewable tablet Chew 1 tablet (81 mg total) by mouth daily. 03/24/22   Rasch, JAnderson MaltaI, NP  cefadroxil (DURICEF) 500 MG capsule Take 1 capsule (500 mg total) by mouth 2 (two) times daily. 04/06/22   Rasch, JArtist Pais NP  Prenatal Vit-Fe Fumarate-FA (PRENATAL MULTIVITAMIN) TABS tablet Take 1 tablet by mouth daily at 12 noon.    [provider]  promethazine (PHENERGAN) 25 MG tablet Take 1 tablet (25 mg total) by mouth every 6 (six) hours as needed for nausea or vomiting. 04/18/22   DRadene Gunning MD    Inpatient Medications: Scheduled Meds:  docusate sodium  100 mg Oral Daily   prenatal vitamin w/FE, FA  1 tablet Oral Q1200   sodium chloride flush  3 mL Intravenous Q12H   Continuous Infusions:  cefTRIAXone (ROCEPHIN)  IV 2 g (04/22/22 0241)   PRN Meds: acetaminophen, calcium carbonate, ondansetron **OR** ondansetron (ZOFRAN) IV, zolpidem  Allergies:    Allergies  Allergen Reactions   Penicillins Rash    Social History:   Social History   Socioeconomic History   Marital status: Single    Spouse name: Not on file   Number of children: Not on file  Years of education: Not on file   Highest education level: Not on file  Occupational History   Not on file  Tobacco Use   Smoking status: Never   Smokeless tobacco: Never  Substance and Sexual Activity   Alcohol use: No   Drug use: No   Sexual activity: Yes    Birth control/protection: None  Other Topics Concern   Not on file  Social History Narrative   Not on file   Social Determinants of Health   Financial Resource Strain: Not on file  Food Insecurity: No Food Insecurity (04/22/2022)    Hunger Vital Sign    Worried About Running Out of Food in the Last Year: Never true    Ran Out of Food in the Last Year: Never true  Transportation Needs: No Transportation Needs (04/22/2022)   PRAPARE - Hydrologist (Medical): No    Lack of Transportation (Non-Medical): No  Physical Activity: Not on file  Stress: Not on file  Social Connections: Not on file  Intimate Partner Violence: Not At Risk (04/22/2022)   Humiliation, Afraid, Rape, and Kick questionnaire    Fear of Current or Ex-Partner: No    Emotionally Abused: No    Physically Abused: No    Sexually Abused: No    Family History:   History reviewed. No pertinent family history.   ROS:  Please see the history of present illness.   All other ROS reviewed and negative.     Physical Exam/Data:   Vitals:   04/22/22 0134 04/22/22 0605 04/22/22 0804 04/22/22 1101  BP:  114/74 130/81 117/76  Pulse: 98 97 95 91  Resp: (!) '22 20 18 16  '$ Temp: 98.7 F (37.1 C) 97.9 F (36.6 C) 97.7 F (36.5 C) 97.9 F (36.6 C)  TempSrc:  Oral Oral Oral  SpO2: 98% 100% 100% 99%  Weight:      Height:        Intake/Output Summary (Last 24 hours) at 04/22/2022 1336 Last data filed at 04/22/2022 1200 Gross per 24 hour  Intake 1897.79 ml  Output 1770 ml  Net 127.79 ml      04/21/2022   11:56 PM 03/30/2022   11:31 PM 03/23/2022    9:39 AM  Last 3 Weights  Weight (lbs) 128 lb 129 lb 11.2 oz 126 lb 1.9 oz  Weight (kg) 58.06 kg 58.832 kg 57.208 kg     Body mass index is 21.97 kg/m.  General:  Well nourished, well developed, in no acute distress HEENT: normal Neck: no JVD Vascular: No carotid bruits; Distal pulses 2+ bilaterally Cardiac:  normal S1, S2; 2/6 systolic murmur Lungs:  clear to auscultation bilaterally, no wheezing, rhonchi or rales  Abd: soft, nontender, no hepatomegaly  Ext: no edema Musculoskeletal:  No deformities, BUE and BLE strength normal and equal Skin: warm and dry  Neuro:  CNs 2-12  intact, no focal abnormalities noted Psych:  Normal affect   EKG:  The EKG was personally reviewed and demonstrates:  NSR, borderline LVH, TWI in inferolateral leads Telemetry:  Telemetry was personally reviewed and demonstrates:  N/A  Relevant CV Studies: TTE 04/22/22: IMPRESSIONS     1. Left ventricular ejection fraction, by estimation, is 40%. Left  ventricular ejection fraction by 3D volume is 40 %. The left ventricle has  mild to moderately decreased function. The left ventricle demonstrates  global hypokinesis and regional wall  motion abnormalities (see scoring diagram/findings for description). The  left ventricular internal  cavity size was mildly dilated. Left ventricular  diastolic parameters were normal.   2. Right ventricular systolic function is normal. The right ventricular  size is normal. Tricuspid regurgitation signal is inadequate for assessing  PA pressure.   3. The mitral valve is grossly normal. Moderate to severe mitral valve  regurgitation. The mitral valve is mildly thickened for age, but no  definite post inflammatory change unless clinical relevance. Suspect  mechanism is secondary MR due to LV  dysfunction. No evidence of mitral stenosis.   4. The aortic valve was not well visualized. Aortic valve regurgitation  is not visualized. No aortic stenosis is present.   5. The inferior vena cava is normal in size with greater than 50%  respiratory variability, suggesting right atrial pressure of 3 mmHg.    Laboratory Data:  High Sensitivity Troponin:  No results for input(s): "TROPONINIHS" in the last 720 hours.   Chemistry Recent Labs  Lab 04/22/22 0030  NA 134*  K 3.8  CL 106  CO2 19*  GLUCOSE 118*  BUN 5*  CREATININE 0.61  CALCIUM 9.3  GFRNONAA >60  ANIONGAP 9    Recent Labs  Lab 04/22/22 0030  PROT 6.7  ALBUMIN 3.3*  AST 50*  ALT 51*  ALKPHOS 30*  BILITOT 0.5   Lipids No results for input(s): "CHOL", "TRIG", "HDL", "LABVLDL",  "LDLCALC", "CHOLHDL" in the last 168 hours.  Hematology Recent Labs  Lab 04/22/22 0030 04/22/22 0500  WBC 10.2 5.4  RBC 4.36 3.77*  HGB 11.3* 9.9*  HCT 33.9* 28.8*  MCV 77.8* 76.4*  MCH 25.9* 26.3  MCHC 33.3 34.4  RDW 14.3 14.3  PLT 365 297   Thyroid No results for input(s): "TSH", "FREET4" in the last 168 hours.  BNPNo results for input(s): "BNP", "PROBNP" in the last 168 hours.  DDimer No results for input(s): "DDIMER" in the last 168 hours.   Radiology/Studies:  ECHOCARDIOGRAM COMPLETE  Result Date: 04/22/2022    ECHOCARDIOGRAM REPORT   Patient Name:   DAMONIE FURNEY Date of Exam: 04/22/2022 Medical Rec #:  [id number]        Height:       64.0 in Accession #:    [id number]       Weight:       128.0 lb Date of Birth:  06/24/1985        BSA:          1.618 m Patient Age:    36 years         BP:           130/81 mmHg Patient Gender: F                HR:           95 bpm. Exam Location:  Inpatient Procedure: 2D Echo, 3D Echo, Cardiac Doppler and Color Doppler Indications:     Abnormal ECG R94.31  History:         Patient has no prior history of Echocardiogram examinations.                  Chest pain and T wave abnromality. [id number].  Sonographer:     Darlina Sicilian RDCS Referring Phys:  [id number] Janyth Pupa Diagnosing Phys: Cherlynn Kaiser MD IMPRESSIONS  1. Left ventricular ejection fraction, by estimation, is 40%. Left ventricular ejection fraction by 3D volume is 40 %. The left ventricle has mild to moderately decreased function. The left ventricle demonstrates global hypokinesis and regional wall motion  abnormalities (see scoring diagram/findings for description). The left ventricular internal cavity size was mildly dilated. Left ventricular diastolic parameters were normal.  2. Right ventricular systolic function is normal. The right ventricular size is normal. Tricuspid regurgitation signal is inadequate for assessing PA pressure.  3. The mitral valve is grossly normal. Moderate to  severe mitral valve regurgitation. The mitral valve is mildly thickened for age, but no definite post inflammatory change unless clinical relevance. Suspect mechanism is secondary MR due to LV dysfunction. No evidence of mitral stenosis.  4. The aortic valve was not well visualized. Aortic valve regurgitation is not visualized. No aortic stenosis is present.  5. The inferior vena cava is normal in size with greater than 50% respiratory variability, suggesting right atrial pressure of 3 mmHg. FINDINGS  Left Ventricle: Left ventricular ejection fraction, by estimation, is 40%. Left ventricular ejection fraction by 3D volume is 40 %. The left ventricle has mild to moderately decreased function. The left ventricle demonstrates regional wall motion abnormalities. The left ventricular internal cavity size was mildly dilated. There is no left ventricular hypertrophy. Left ventricular diastolic parameters were normal.  LV Wall Scoring: The apical lateral segment is hypokinetic. Right Ventricle: The right ventricular size is normal. No increase in right ventricular wall thickness. Right ventricular systolic function is normal. Tricuspid regurgitation signal is inadequate for assessing PA pressure. Left Atrium: Left atrial size was normal in size. Right Atrium: Right atrial size was normal in size. Pericardium: There is no evidence of pericardial effusion. Mitral Valve: The mitral valve is grossly normal. Moderate to severe mitral valve regurgitation. No evidence of mitral valve stenosis. Tricuspid Valve: The tricuspid valve is grossly normal. Tricuspid valve regurgitation is mild . No evidence of tricuspid stenosis. Aortic Valve: The aortic valve was not well visualized. Aortic valve regurgitation is not visualized. No aortic stenosis is present. Pulmonic Valve: The pulmonic valve was grossly normal. Pulmonic valve regurgitation is mild. No evidence of pulmonic stenosis. Aorta: The aortic root, ascending aorta and aortic  arch are all structurally normal, with no evidence of dilitation or obstruction. Venous: The inferior vena cava is normal in size with greater than 50% respiratory variability, suggesting right atrial pressure of 3 mmHg. IAS/Shunts: The interatrial septum was not well visualized.  LEFT VENTRICLE PLAX 2D LVIDd:         5.47 cm         Diastology LVIDs:         4.50 cm         LV e' medial:    9.29 cm/s LV PW:         0.80 cm         LV E/e' medial:  10.5 LV IVS:        0.60 cm         LV e' lateral:   13.80 cm/s LVOT diam:     1.80 cm         LV E/e' lateral: 7.1 LV SV:         30 LV SV Index:   18 LVOT Area:     2.54 cm        3D Volume EF                                LV 3D EF:    Left  ventricul                                             ar                                             ejection                                             fraction                                             by 3D                                             volume is                                             40 %.                                 3D Volume EF:                                3D EF:        40 %                                LV EDV:       237 ml                                LV ESV:       143 ml                                LV SV:        95 ml RIGHT VENTRICLE RV S prime:     12.90 cm/s TAPSE (M-mode): 2.2 cm LEFT ATRIUM             Index        RIGHT ATRIUM           Index LA Vol (A2C):   47.2 ml 29.17 ml/m  RA Area:     13.50 cm LA Vol (A4C):   70.6 ml 43.62 ml/m  RA Volume:   30.10 ml  18.60 ml/m LA Biplane Vol: 61.0 ml 37.69 ml/m  AORTIC VALVE             PULMONIC VALVE LVOT Vmax:   71.10 cm/s  RVOT Peak grad: 3 mmHg LVOT Vmean:  54.200 cm/s LVOT VTI:    0.117 m  AORTA Ao Root diam: 2.60 cm Ao Asc diam:  2.70 cm MITRAL VALVE MV Area (PHT): 3.60 cm       SHUNTS MV Decel Time: 211 msec       Systemic VTI:  0.12 m MR Peak grad:    106.1 mmHg   Systemic Diam: 1.80  cm MR Mean grad:    69.0 mmHg    Pulmonic VTI:  0.209 m MR Vmax:         515.00 cm/s MR Vmean:        392.0 cm/s MR PISA:         1.01 cm MR PISA Eff ROA: 8 mm MR PISA Radius:  0.40 cm MV E velocity: 97.40 cm/s MV A velocity: 73.50 cm/s MV E/A ratio:  1.33 Cherlynn Kaiser MD Electronically signed by Cherlynn Kaiser MD Signature Date/Time: 04/22/2022/11:06:27 AM    Final (Updated)    DG Chest Portable 1 View  Result Date: 04/22/2022 CLINICAL DATA:  Cough, congestion and shortness of breath x1 day. EXAM: PORTABLE CHEST 1 VIEW COMPARISON:  August 23, 2016 FINDINGS: The heart size and mediastinal contours are within normal limits. Both lungs are clear. The visualized skeletal structures are unremarkable. IMPRESSION: No active disease. Electronically Signed   By: Virgina Norfolk M.D.   On: 04/22/2022 02:41     Assessment and Plan:   #Systolic HF with mildly reduced EF: Patient with no known history of cardiovascular disease. Was noted to have abnormal ECG on admission prompting TTE which revealed LVEF 40% with apical lateral hypokinesis, moderate-to-severe MR with mildly thickened valve for age. She is currently compensated and euvolemic on exam. Unknown etiology as patient denies any personal or family history of CV disease, no known serious illnesses as a child or adult, and no history of hospitalization for volume overload/chest pain. States she had pre-eclampsia during first pregnancy at time of delivery but HTN resolved post-partum and she never required lasix. She reports chronic dyspnea on exertion that is worse with current pregnancy but no significant chest pain, palpitations, LE edema or orthopnea. Could consider stress CM in the setting of pyelonephritis but she has a mild case and this seems to be less likely. Given WMA, will check trop to ensure no current SCAD as well as BNP given persistent SOB. Will plan to manage medically throughout pregnancy with close monitoring of echoes and regular  follow-up. Once she is no longer pregnancy, will proceed with more in-depth work-up including CMR and coronary CTA. -Discussed she is WHO class III with pregnancy, CARPREG score 4 corresponding to 20-40% chance of CV complications during pregnancy -Check trop given WMA on TTE to ensure no recent SCAD -Check BNP given persistent SOB although patient appears compensated on exam -Will start low dose metop 12.'5mg'$  XL at night; once no longer pregnant, can optimize meds further -Will need continued close monitoring of TTE going forward throughout pregnancy and will follow closely in cardio OB clinic -Once she is no longer pregnant, we can pursue further work-up with CMR/TEE for her valve and likely coronary CTA   #Moderate-to-severe MR: MV appears more thickened than would be expected for age but does not appear post-inflammatory. Denies any history of rheumatic fever, IVDU, blood stream infections, severe childhood illness or family history of disease. Likely this has been chronic and ongoing and patient is currently compensated and euvolemic on exam. Will manage HF as above. High risk of volume overload in pregnancy  and will need to monitor her fluid status closely. She will be monitored with serial echoes as an outpatient. Will start metop as above. BP is currently well controlled. Once no longer pregnant, will pursue further work-up.  -Start metop 12.'5mg'$  XL daily -Will need continued monitoring with echoes monthly as outpatient -Will need to watch volume status closely with pregnancy -Will pursue further work-up once patient is no longer pregnant as detailed above  #Pyelonephritis: Improving. -Management per OBGYN  Will arrange for close follow-up as outpatient with Cardio-OB clinic.     Risk Assessment/Risk Scores:        New York Heart Association (NYHA) Functional Class NYHA Class II        For questions or updates, please contact [place] Please consult [email or web address]  for contact info under    Signed, Freada Bergeron, MD  04/22/2022 1:36 PM

## 2022-04-22 NOTE — Progress Notes (Signed)
  Echocardiogram 2D Echocardiogram has been performed.  Darlina Sicilian M 04/22/2022, 10:39 AM

## 2022-04-22 NOTE — H&P (Addendum)
CSN: [id number]   Arrival date and time: 04/21/22 2344    None       Chief Complaint  Patient presents with   Fever   Generalized Body Aches    HPI Tamecca Artiga is a 36 y.o. [id number] at 67w5dwho presents to MAU for fever, body aches, and shortness of breath. She reports that she has not felt good all day however around 8pm she started to feel worse. She took her temp this evening which was 101. She rechecked it prior to leaving her house and it was 100.5. She reports that she "feels stuffy" but denies cough, sore throat, or other URI symptoms. She has felt short of breath and like she cannot catch her breath all day. No chest pain, history of asthma or cardiac disease. She has no known sick contacts. She reports mild cramping in lower abdomen and back but thinks that is due to moving around recently. She denies vaginal bleeding, discharge or urinary s/s. Of note, she reports that she went to the office recently to do a urine test of cure. She reports she saw that she had an antibiotic at the pharmacy while on MyChart this evening, however was unaware that it was there until tonight.   [facility]     OB History       Gravida  5   Para  2   Term  1   Preterm  1   AB  2   Living  2        SAB  1   IAB  1   Ectopic      Multiple  0   Live Births  2                   Past Medical History:  Diagnosis Date   Bacterial infection     History of chicken pox             Past Surgical History:  Procedure Laterality Date   CESAREAN SECTION       CESAREAN SECTION N/A 02/02/2015    Procedure: CESAREAN SECTION;  Surgeon: EThurnell Lose MD;  [facility];  Service: Obstetrics;  Laterality: N/A;   DILATION AND CURETTAGE OF UTERUS       WISDOM TOOTH EXTRACTION          History reviewed. No pertinent family history.   Social History        Tobacco Use   Smoking status: Never   Smokeless tobacco: Never  Substance Use Topics    Alcohol use: No   Drug use: No      Allergies:      Allergies  Allergen Reactions   Penicillins Rash             Medications Prior to Admission  Medication Sig Dispense Refill Last Dose   Prenatal MV & Min w/FA-DHA ([employer] PRENATAL GUMMY) 0.18-25 MG CHEW Chew 1 tablet by mouth daily. 30 tablet 3 04/21/2022   aspirin (ASPIRIN CHILDRENS) 81 MG chewable tablet Chew 1 tablet (81 mg total) by mouth daily. 90 tablet 1     cefadroxil (DURICEF) 500 MG capsule Take 1 capsule (500 mg total) by mouth 2 (two) times daily. 14 capsule 0     Prenatal Vit-Fe Fumarate-FA (PRENATAL MULTIVITAMIN) TABS tablet Take 1 tablet by mouth daily at 12 noon.         promethazine (PHENERGAN) 25 MG tablet Take 1 tablet (25 mg  total) by mouth every 6 (six) hours as needed for nausea or vomiting. 30 tablet 1      Review of Systems  Constitutional:  Positive for fever.  HENT:  Negative for congestion and sore throat.   Respiratory:  Positive for shortness of breath. Negative for cough and chest tightness.   Cardiovascular: Negative.   Gastrointestinal:  Positive for abdominal pain (cramping) and nausea.  Genitourinary: Negative.   Musculoskeletal:  Positive for back pain and myalgias.  Neurological: Negative.     Physical Exam    Patient Vitals for the past 24 hrs:   BP Temp Pulse Resp SpO2 Height Weight  04/22/22 0134 -- 98.7 F (37.1 C) 98 (!) 22 98 % -- --  04/22/22 0120 -- -- 97 -- 98 % -- --  04/22/22 0115 -- -- -- -- 98 % -- --  04/22/22 0100 -- -- -- -- 99 % -- --  04/22/22 0045 -- -- -- -- 99 % -- --  04/21/22 2356 123/87 99.7 F (37.6 C) (!) 118 (!) 24 100 % '5\' 4"'$  (1.626 m) 58.1 kg    Physical Exam Vitals and nursing note reviewed.  Constitutional:      General: She is not in acute distress. Eyes:     Extraocular Movements: Extraocular movements intact.     Pupils: Pupils are equal, round, and reactive to light.  Cardiovascular:     Rate and Rhythm: Regular rhythm. Tachycardia present.      Heart sounds: Normal heart sounds.  Pulmonary:     Effort: Pulmonary effort is normal. No respiratory distress.     Breath sounds: Normal breath sounds. No wheezing, rhonchi or rales.  Abdominal:     Palpations: Abdomen is soft.     Tenderness: There is no abdominal tenderness. There is no right CVA tenderness. There is left CVA tenderness.  Musculoskeletal:        General: Normal range of motion.     Cervical back: Normal range of motion.  Skin:    General: Skin is warm and dry.  Neurological:     General: No focal deficit present.     Mental Status: She is alert and oriented to person, place, and time.  Psychiatric:        Mood and Affect: Mood normal.        Behavior: Behavior normal.      FHR: 169 bpm via doppler   MAU Course  Procedures   MDM UA LR bolus CBC, CMP, Covid/Flu Chest x-ray EKG Continuous pulse ox '1000mg'$  Tylenol PO Patient offered antiemetics but declines    Patient afebrile in MAU although slightly tachypnic and tacycardic. O2 sats stable >98% on RA. No evidence of respiratory distress on exam and lung sounds clear bilaterally. She does have left sided CVA tenderness on exam. LR bolus initiated and Tylenol PO given. Patient was offered antiemetics for nausea however declines. UA positive for nitrites and many bacteria. Urine culture sent. Respiratory panel negative. WBC normal. She does have a slight elevation in liver enzymes. Chest x-ray pending.   Abnormal EKG reviewed with Dr. Radford Pax from cardiology. Given no previous EKG readings and patient's presentation of shortness of breath, she recommends echo in the morning. I reviewed patient presentation and findings with Dr. Nelda Marseille. Will admit for pyelonephritis and IV antibiotics. I reviewed recommendations with patient and her significant other who agree with plan of care    Assessment and Plan  [redacted] weeks gestation of pregnancy Pyelonephritis vs UTI affecting pregnancy  Chest pain  - Admit to [facility] - Dr.  Nelda Marseille to place admission orders    Renee Harder, CNM 04/22/22 2:14 AM

## 2022-04-22 NOTE — Progress Notes (Signed)
Pt to 116 [facility] via w/c at [facility]

## 2022-04-22 NOTE — MAU Note (Signed)
.  [name] is a 36 y.o. at 105w5dhere in MAU reporting fever all day and body aches. "Hard to get air" . Temp 101 at home tonight Able to eat and drink ok. Did not take any meds Onset of complaint: today Pain score: 8 Vitals:   04/21/22 2356  BP: 123/87  Pulse: (!) 118  Resp: (!) 24  Temp: 99.7 F (37.6 C)  SpO2: 100%     FHT:169 Lab orders placed from triage:  u/a

## 2022-04-23 ENCOUNTER — Other Ambulatory Visit (HOSPITAL_COMMUNITY): Payer: Self-pay

## 2022-04-23 ENCOUNTER — Encounter: Payer: Self-pay | Admitting: Obstetrics and Gynecology

## 2022-04-23 ENCOUNTER — Telehealth: Payer: Self-pay | Admitting: *Deleted

## 2022-04-23 DIAGNOSIS — I340 Nonrheumatic mitral (valve) insufficiency: Secondary | ICD-10-CM

## 2022-04-23 DIAGNOSIS — Z3480 Encounter for supervision of other normal pregnancy, unspecified trimester: Secondary | ICD-10-CM

## 2022-04-23 DIAGNOSIS — I5020 Unspecified systolic (congestive) heart failure: Secondary | ICD-10-CM | POA: Diagnosis not present

## 2022-04-23 DIAGNOSIS — O99412 Diseases of the circulatory system complicating pregnancy, second trimester: Secondary | ICD-10-CM | POA: Diagnosis not present

## 2022-04-23 DIAGNOSIS — Z3A15 15 weeks gestation of pregnancy: Secondary | ICD-10-CM

## 2022-04-23 DIAGNOSIS — O2302 Infections of kidney in pregnancy, second trimester: Secondary | ICD-10-CM

## 2022-04-23 MED ORDER — METOPROLOL SUCCINATE ER 25 MG PO TB24
12.5000 mg | ORAL_TABLET | Freq: Every day | ORAL | 3 refills | Status: DC
  Filled 2022-04-23: qty 15, 30d supply, fill #0
  Filled 2022-05-21: qty 15, 30d supply, fill #1
  Filled 2022-06-21: qty 15, 30d supply, fill #0

## 2022-04-23 MED ORDER — CEFADROXIL 500 MG PO CAPS
500.0000 mg | ORAL_CAPSULE | Freq: Two times a day (BID) | ORAL | 0 refills | Status: DC
  Filled 2022-04-23: qty 14, 7d supply, fill #0

## 2022-04-23 NOTE — Progress Notes (Signed)
Trops negative x2. BNP low 100s. No evidence of volume overload. Okay to discharge home from CV perspective. Will arrange for close follow-up with Cardio-OB clinic.  Gwyndolyn Kaufman, MD

## 2022-04-23 NOTE — Telephone Encounter (Signed)
-----   Message from Nuala Alpha, LPN sent at 82/02/9370  8:39 AM EST ----- Molli Knock, could you please call this Cardio OB pt  and schedule her at [address] in 2 weeks on 11/20 at the 1140 hold (7 day slot) slot on Dr. Jacolyn Reedy schedule?  That is all we have available at that time, and nothing at [facility].   Thanks [employer]    ----- Message ----- From: Freada Bergeron, MD Sent: 04/22/2022   2:59 PM EST To: Nuala Alpha, LPN; Armando Gang  She will need cardio OB follow-up in 2 weeks. She will be a frequent flier for moderate-to-severe MR and heart failure. Probably will be seeing her every 2 weeks with monthly echoes.   Thank you!!  -Nira Conn

## 2022-04-23 NOTE — Telephone Encounter (Signed)
Pt is scheduled for follow-up with Dr. Johney Frame on 05/07/22 at 1140.  She is a Cardio OB pt.  Pt will see Dr. Johney Frame at our [facility] location. Pt made aware of appt date and time.    Pt will need another OB Echo done in one month from prior one.  Last Echo done was yesterday 11/5.  Will go ahead and order repeat OB Echo for the pt to have done here in the office on or around 12/5.  Will send [facility] Scheduler/Echo Scheduler a message to arrange OB echo for that time.

## 2022-04-23 NOTE — Discharge Summary (Signed)
Antenatal Physician Discharge Summary  Patient ID: [name] MRN: [id number] DOB/AGE: 35-May-1987 35 y.o.  Admit date: 04/21/2022 Discharge date: 04/23/2022  Admission Diagnoses: fever 101 body aches with shortness of breath  Discharge Diagnoses:  Patient Active Problem List   Diagnosis Date Noted   Systolic heart failure ([place]) 04/23/2022   Pyelonephritis affecting pregnancy in second trimester 04/22/2022   Multigravida of advanced maternal age in first trimester 03/24/2022   Supervision of other normal pregnancy, antepartum 03/23/2022  Pregnancy with 15 completed weeks Maternal mitral valve regurgitation affecting pregnancy in second trimester, antepartum   Prenatal Procedures: Maternal Echo  Consults: Cardiology  Hospital Course:  [name] is a 36 y.o. [id number] with IUP at 1w6dadmitted for presumed pyelonephritis. She had had a UTI diagnosed in pregnancy but had not been aware antibiotics were sent in and thus had not taken them.   She was admitted with fever, body aches, and shortness of breath.   No leaking of fluid and no bleeding.  She was initially started on Rocephin. She also noted chest pain and ultimately had a maternal echo which showed EF 40% with mitral valve regurgitation (mod-severe). Cardiology was consulted and the patient was seen by Dr. PJohney Frame The patient was started on Metoprolol and was then cleared for discharge on HD2 from a cardiac perspective with planned short term follow up arranged. From an infection standpoint, she responded quickly to the antibiotics, was afebrile and was deemed stable for discharge. Final culture was pending but showed GNR and patient's prior culture showed E. Coli which was pan-sensitive so therapy was directed for this.   Discharge Exam: Temp:  [97.8 F (36.6 C)-98.3 F (36.8 C)] 97.9 F (36.6 C) (11/06 0900) Pulse Rate:  [78-91] 80 (11/06 0900) Resp:  [16-18] 16 (11/06 0900) BP: (117-120)/(69-82) 117/82 (11/06  0900) SpO2:  [97 %-100 %] 100 % (11/06 0900) Physical Examination: CONSTITUTIONAL: Well-developed, well-nourished female in no acute distress.  HENT:  Normocephalic, atraumatic, External right and left ear normal.  EYES: Conjunctivae and EOM are normal. Pupils are equal, round, and reactive to light. No scleral icterus.  NECK: Normal range of motion, supple, no masses SKIN: Skin is warm and dry. No rash noted. Not diaphoretic. No erythema. No pallor. NEUROLOGIC: Alert and oriented to person, place, and time. Normal reflexes, muscle tone coordination. No cranial nerve deficit noted. PSYCHIATRIC: Normal mood and affect. Normal behavior. Normal judgment and thought content. CARDIOVASCULAR: Normal heart rate noted, regular rhythm RESPIRATORY: Effort and breath sounds normal, no problems with respiration noted MUSCULOSKELETAL: Normal range of motion. No edema and no tenderness. 2+ distal pulses. ABDOMEN: Soft, nontender, nondistended, gravid. CERVIX:  Not examined  Fetal monitoring: FHR: 154 by doppler today  Significant Diagnostic Studies:  Results for orders placed or performed during the hospital encounter of 04/21/22 (from the past 168 hour(s))  Resp Panel by RT-PCR (Flu A&B, Covid) Anterior Nasal Swab   Collection Time: 04/22/22 12:15 AM   Specimen: Anterior Nasal Swab  Result Value Ref Range   SARS Coronavirus 2 by RT PCR NEGATIVE NEGATIVE   Influenza A by PCR NEGATIVE NEGATIVE   Influenza B by PCR NEGATIVE NEGATIVE  CBC with Differential/Platelet   Collection Time: 04/22/22 12:30 AM  Result Value Ref Range   WBC 10.2 4.0 - 10.5 K/uL   RBC 4.36 3.87 - 5.11 MIL/uL   Hemoglobin 11.3 (L) 12.0 - 15.0 g/dL   HCT 33.9 (L) 36.0 - 46.0 %   MCV 77.8 (L) 80.0 - 100.0 fL  MCH 25.9 (L) 26.0 - 34.0 pg   MCHC 33.3 30.0 - 36.0 g/dL   RDW 14.3 11.5 - 15.5 %   Platelets 365 150 - 400 K/uL   nRBC 0.0 0.0 - 0.2 %   Neutrophils Relative % 91 %   Neutro Abs 9.2 (H) 1.7 - 7.7 K/uL   Lymphocytes  Relative 6 %   Lymphs Abs 0.6 (L) 0.7 - 4.0 K/uL   Monocytes Relative 3 %   Monocytes Absolute 0.3 0.1 - 1.0 K/uL   Eosinophils Relative 0 %   Eosinophils Absolute 0.0 0.0 - 0.5 K/uL   Basophils Relative 0 %   Basophils Absolute 0.0 0.0 - 0.1 K/uL   Immature Granulocytes 0 %   Abs Immature Granulocytes 0.03 0.00 - 0.07 K/uL  Comprehensive metabolic panel   Collection Time: 04/22/22 12:30 AM  Result Value Ref Range   Sodium 134 (L) 135 - 145 mmol/L   Potassium 3.8 3.5 - 5.1 mmol/L   Chloride 106 98 - 111 mmol/L   CO2 19 (L) 22 - 32 mmol/L   Glucose, Bld 118 (H) 70 - 99 mg/dL   BUN 5 (L) 6 - 20 mg/dL   Creatinine, Ser 0.61 0.44 - 1.00 mg/dL   Calcium 9.3 8.9 - 10.3 mg/dL   Total Protein 6.7 6.5 - 8.1 g/dL   Albumin 3.3 (L) 3.5 - 5.0 g/dL   AST 50 (H) 15 - 41 U/L   ALT 51 (H) 0 - 44 U/L   Alkaline Phosphatase 30 (L) 38 - 126 U/L   Total Bilirubin 0.5 0.3 - 1.2 mg/dL   GFR, Estimated >60 >60 mL/min   Anion gap 9 5 - 15  Urinalysis, Routine w reflex microscopic Urine, Clean Catch   Collection Time: 04/22/22 12:38 AM  Result Value Ref Range   Color, Urine YELLOW YELLOW   APPearance HAZY (A) CLEAR   Specific Gravity, Urine 1.008 1.005 - 1.030   pH 6.0 5.0 - 8.0   Glucose, UA 150 (A) NEGATIVE mg/dL   Hgb urine dipstick NEGATIVE NEGATIVE   Bilirubin Urine NEGATIVE NEGATIVE   Ketones, ur 5 (A) NEGATIVE mg/dL   Protein, ur NEGATIVE NEGATIVE mg/dL   Nitrite POSITIVE (A) NEGATIVE   Leukocytes,Ua SMALL (A) NEGATIVE   RBC / HPF 0-5 0 - 5 RBC/hpf   WBC, UA 11-20 0 - 5 WBC/hpf   Bacteria, UA MANY (A) NONE SEEN   Squamous Epithelial / LPF 6-10 0 - 5  Culture, OB Urine   Collection Time: 04/22/22  1:18 AM   Specimen: Urine, Clean Catch  Result Value Ref Range   Specimen Description URINE, CLEAN CATCH    Special Requests NONE    Culture (A)     >=100,000 COLONIES/mL GRAM NEGATIVE RODS SUSCEPTIBILITIES TO FOLLOW NO GROUP B STREP (S.AGALACTIAE) ISOLATED Performed at [facility], [address], [place], [place] [address]    Report Status PENDING   CBC   Collection Time: 04/22/22  5:00 AM  Result Value Ref Range   WBC 5.4 4.0 - 10.5 K/uL   RBC 3.77 (L) 3.87 - 5.11 MIL/uL   Hemoglobin 9.9 (L) 12.0 - 15.0 g/dL   HCT 28.8 (L) 36.0 - 46.0 %   MCV 76.4 (L) 80.0 - 100.0 fL   MCH 26.3 26.0 - 34.0 pg   MCHC 34.4 30.0 - 36.0 g/dL   RDW 14.3 11.5 - 15.5 %   Platelets 297 150 - 400 K/uL   nRBC 0.0 0.0 -  0.2 %  ECHOCARDIOGRAM COMPLETE   Collection Time: 04/22/22 10:39 AM  Result Value Ref Range   Weight 2,048 oz   Height 64 in   BP 130/81 mmHg   S' Lateral 4.50 cm   Area-P 1/2 3.60 cm2   Radius 0.40 cm   MV M vel 5.15 m/s   MV Peak grad 106.1 mmHg  Brain natriuretic peptide   Collection Time: 04/22/22  2:47 PM  Result Value Ref Range   B Natriuretic Peptide 109.9 (H) 0.0 - 100.0 pg/mL  Troponin I (High Sensitivity)   Collection Time: 04/22/22  2:47 PM  Result Value Ref Range   Troponin I (High Sensitivity) 8 <18 ng/L  Troponin I (High Sensitivity)   Collection Time: 04/22/22  4:36 PM  Result Value Ref Range   Troponin I (High Sensitivity) 9 <18 ng/L   ECHOCARDIOGRAM COMPLETE  Result Date: 04/22/2022    ECHOCARDIOGRAM REPORT   Patient Name:   [name] Date of Exam: 04/22/2022 Medical Rec #:  [id number]        Height:       64.0 in Accession #:    [id number]       Weight:       128.0 lb Date of Birth:  05-25-1986        BSA:          1.618 m Patient Age:    15 years         BP:           130/81 mmHg Patient Gender: F                HR:           95 bpm. Exam Location:  Inpatient Procedure: 2D Echo, 3D Echo, Cardiac Doppler and Color Doppler Indications:     Abnormal ECG R94.31  History:         Patient has no prior history of Echocardiogram examinations.                  Chest pain and T wave abnromality. [id number].  Sonographer:     Darlina Sicilian RDCS Referring Phys:  [id number] Janyth Pupa Diagnosing Phys: Cherlynn Kaiser MD IMPRESSIONS  1. Left  ventricular ejection fraction, by estimation, is 40%. Left ventricular ejection fraction by 3D volume is 40 %. The left ventricle has mild to moderately decreased function. The left ventricle demonstrates global hypokinesis and regional wall motion abnormalities (see scoring diagram/findings for description). The left ventricular internal cavity size was mildly dilated. Left ventricular diastolic parameters were normal.  2. Right ventricular systolic function is normal. The right ventricular size is normal. Tricuspid regurgitation signal is inadequate for assessing PA pressure.  3. The mitral valve is grossly normal. Moderate to severe mitral valve regurgitation. The mitral valve is mildly thickened for age, but no definite post inflammatory change unless clinical relevance. Suspect mechanism is secondary MR due to LV dysfunction. No evidence of mitral stenosis.  4. The aortic valve was not well visualized. Aortic valve regurgitation is not visualized. No aortic stenosis is present.  5. The inferior vena cava is normal in size with greater than 50% respiratory variability, suggesting right atrial pressure of 3 mmHg. FINDINGS  Left Ventricle: Left ventricular ejection fraction, by estimation, is 40%. Left ventricular ejection fraction by 3D volume is 40 %. The left ventricle has mild to moderately decreased function. The left ventricle demonstrates regional wall motion abnormalities. The left ventricular internal cavity size was mildly dilated.  There is no left ventricular hypertrophy. Left ventricular diastolic parameters were normal.  LV Wall Scoring: The apical lateral segment is hypokinetic. Right Ventricle: The right ventricular size is normal. No increase in right ventricular wall thickness. Right ventricular systolic function is normal. Tricuspid regurgitation signal is inadequate for assessing PA pressure. Left Atrium: Left atrial size was normal in size. Right Atrium: Right atrial size was normal in size.  Pericardium: There is no evidence of pericardial effusion. Mitral Valve: The mitral valve is grossly normal. Moderate to severe mitral valve regurgitation. No evidence of mitral valve stenosis. Tricuspid Valve: The tricuspid valve is grossly normal. Tricuspid valve regurgitation is mild . No evidence of tricuspid stenosis. Aortic Valve: The aortic valve was not well visualized. Aortic valve regurgitation is not visualized. No aortic stenosis is present. Pulmonic Valve: The pulmonic valve was grossly normal. Pulmonic valve regurgitation is mild. No evidence of pulmonic stenosis. Aorta: The aortic root, ascending aorta and aortic arch are all structurally normal, with no evidence of dilitation or obstruction. Venous: The inferior vena cava is normal in size with greater than 50% respiratory variability, suggesting right atrial pressure of 3 mmHg. IAS/Shunts: The interatrial septum was not well visualized.  LEFT VENTRICLE PLAX 2D LVIDd:         5.47 cm         Diastology LVIDs:         4.50 cm         LV e' medial:    9.29 cm/s LV PW:         0.80 cm         LV E/e' medial:  10.5 LV IVS:        0.60 cm         LV e' lateral:   13.80 cm/s LVOT diam:     1.80 cm         LV E/e' lateral: 7.1 LV SV:         30 LV SV Index:   18 LVOT Area:     2.54 cm        3D Volume EF                                LV 3D EF:    Left                                             ventricul                                             ar                                             ejection                                             fraction  by 3D                                             volume is                                             40 %.                                 3D Volume EF:                                3D EF:        40 %                                LV EDV:       237 ml                                LV ESV:       143 ml                                LV SV:        95 ml  RIGHT VENTRICLE RV S prime:     12.90 cm/s TAPSE (M-mode): 2.2 cm LEFT ATRIUM             Index        RIGHT ATRIUM           Index LA Vol (A2C):   47.2 ml 29.17 ml/m  RA Area:     13.50 cm LA Vol (A4C):   70.6 ml 43.62 ml/m  RA Volume:   30.10 ml  18.60 ml/m LA Biplane Vol: 61.0 ml 37.69 ml/m  AORTIC VALVE             PULMONIC VALVE LVOT Vmax:   71.10 cm/s  RVOT Peak grad: 3 mmHg LVOT Vmean:  54.200 cm/s LVOT VTI:    0.117 m  AORTA Ao Root diam: 2.60 cm Ao Asc diam:  2.70 cm MITRAL VALVE MV Area (PHT): 3.60 cm       SHUNTS MV Decel Time: 211 msec       Systemic VTI:  0.12 m MR Peak grad:    106.1 mmHg   Systemic Diam: 1.80 cm MR Mean grad:    69.0 mmHg    Pulmonic VTI:  0.209 m MR Vmax:         515.00 cm/s MR Vmean:        392.0 cm/s MR PISA:         1.01 cm MR PISA Eff ROA: 8 mm MR PISA Radius:  0.40 cm MV E velocity: 97.40 cm/s MV A velocity: 73.50 cm/s MV E/A ratio:  1.33 Cherlynn Kaiser MD Electronically signed by Cherlynn Kaiser MD Signature Date/Time: 04/22/2022/11:06:27 AM    Final (Updated)    DG Chest Portable 1 View  Result Date: 04/22/2022 CLINICAL DATA:  Cough,  congestion and shortness of breath x1 day. EXAM: PORTABLE CHEST 1 VIEW COMPARISON:  August 23, 2016 FINDINGS: The heart size and mediastinal contours are within normal limits. Both lungs are clear. The visualized skeletal structures are unremarkable. IMPRESSION: No active disease. Electronically Signed   By: Virgina Norfolk M.D.   On: 04/22/2022 02:41    Future Appointments  Date Time Provider [place]  04/27/2022  9:50 AM Rasch, Artist Pais, NP [facility]  05/07/2022 11:40 AM Freada Bergeron, MD [facility]    Discharge Condition: Stable  Discharge disposition: 01-Home or Self Care       Discharge Instructions     Activity as tolerated - No restrictions   Complete by: As directed    Call MD for:   Complete by: As directed    Decreased fetal movement, contractions, and  vaginal bleeding.   Call MD for:  difficulty breathing, headache or visual disturbances   Complete by: As directed    Call MD for:  persistant nausea and vomiting   Complete by: As directed    Call MD for:  redness, tenderness, or signs of infection (pain, swelling, redness, odor or green/yellow discharge around incision site)   Complete by: As directed    Call MD for:  severe uncontrolled pain   Complete by: As directed    Call MD for:  temperature >100.4   Complete by: As directed    Diet general   Complete by: As directed    May shower / Bathe   Complete by: As directed       Allergies as of 04/23/2022       Reactions   Penicillins Rash        Medication List     STOP taking these medications    prenatal multivitamin Tabs tablet       TAKE these medications    aspirin 81 MG chewable tablet Commonly known as: Aspirin Childrens Chew 1 tablet (81 mg total) by mouth daily.   cefadroxil 500 MG capsule Commonly known as: DURICEF Take 1 capsule (500 mg total) by mouth 2 (two) times daily.   [facility] Prenatal Gummy 0.18-25 MG Chew Chew 1 tablet by mouth daily.   metoprolol succinate 25 MG 24 hr tablet Commonly known as: TOPROL-XL Take 0.5 tablets (12.5 mg total) by mouth at bedtime.   promethazine 25 MG tablet Commonly known as: PHENERGAN Take 1 tablet (25 mg total) by mouth every 6 (six) hours as needed for nausea or vomiting.        Follow-up [place] for [facility] [place] Follow up.   Specialty: Obstetrics and Gynecology Why: Follow up as scheduled on 11/10. Contact information: [place], [place] [phone]        Freada Bergeron, MD Follow up on 05/07/2022.   Specialties: Cardiology, Radiology Why: Your appointmetn is at 11:40 with Dr. Johney Frame. Contact information: [address] [place] [address] [phone]                 Total  discharge time: 30 minutes   Signed: Radene Gunning M.D. 04/23/2022, 10:54 AM

## 2022-04-23 NOTE — Telephone Encounter (Signed)
RE: need OB ECHO in one month from prior on done on 11/5 Received: Today Dario Guardian, LPN Patient is scheduled for 05/22/22.

## 2022-04-24 ENCOUNTER — Telehealth: Payer: Self-pay | Admitting: Cardiology

## 2022-04-24 LAB — CULTURE, OB URINE: Culture: >=100000 — AB

## 2022-04-24 NOTE — Telephone Encounter (Unsigned)
New Message:    Patient wan to ask Dr Johney Frame with her diagnosis Systolic Heart Failure, will she still be able to work a job effiently

## 2022-04-24 NOTE — Telephone Encounter (Signed)
Pt aware Dr. Johney Frame nor her nurse are the office today, but will address when they return tomorrow. Pt verbalized understanding.

## 2022-04-25 NOTE — Telephone Encounter (Signed)
Kassady, [place] - 04/24/2022 10:51 AM Freada Bergeron, MD  Sent: Wed April 25, 2022  1:20 PM  To: Nuala Alpha, LPN         Message  Yes! She can continue to work (great question) from our standpoint!

## 2022-04-25 NOTE — Telephone Encounter (Signed)
Will forward this message to Dr. Johney Frame to further advise on.  We will be seeing the pt on 11/20.

## 2022-04-25 NOTE — Telephone Encounter (Signed)
Pt aware that per Dr. Johney Frame, she can continue to work from a cardiovascular standpoint.   Pt was asking if she feels tired and needs to call out, could we provide FMLA for that.  She said with having another kid and being pregnant, she feels tired and exhausted and feels like calling into work, and is wandering if Dr. Johney Frame would write her to be out on those days.  Pt aware that per Dr. Johney Frame, from a cardiovascular standpoint, she is safe to work and cleared to work.  She is aware that per Dr. Johney Frame, if she is needing FMLA assistance through her pregnancy or needs to be written out of work during her pregnancy, then she will need to refer to her OBGYN for this request.   Pt aware we will see her as planned on 11/20 for follow-up.  Pt verbalized understanding and agrees with this plan.

## 2022-04-27 ENCOUNTER — Ambulatory Visit (INDEPENDENT_AMBULATORY_CARE_PROVIDER_SITE_OTHER): Payer: 59 | Admitting: Obstetrics and Gynecology

## 2022-04-27 VITALS — BP 115/78 | HR 80 | Wt 129.0 lb

## 2022-04-27 DIAGNOSIS — Z3480 Encounter for supervision of other normal pregnancy, unspecified trimester: Secondary | ICD-10-CM

## 2022-04-27 DIAGNOSIS — Z3A16 16 weeks gestation of pregnancy: Secondary | ICD-10-CM

## 2022-04-27 DIAGNOSIS — I5020 Unspecified systolic (congestive) heart failure: Secondary | ICD-10-CM

## 2022-04-27 DIAGNOSIS — Z3482 Encounter for supervision of other normal pregnancy, second trimester: Secondary | ICD-10-CM

## 2022-04-27 NOTE — Progress Notes (Signed)
   PRENATAL VISIT NOTE  Subjective:  [name] is a 36 y.o. [id number] at 80w3dbeing seen today for ongoing prenatal care.  She is currently monitored for the following issues for this high-risk pregnancy and has Supervision of other normal pregnancy, antepartum; History of gestational hypertension; Subchorionic hematoma in first trimester; Multigravida of advanced maternal age in first trimester; History of preterm delivery, currently pregnant; UTI in pregnancy; Abnormal Pap smear of cervix; Pyelonephritis affecting pregnancy in second trimester; Systolic heart failure ([place]; and Maternal mitral valve regurgitation affecting pregnancy in second trimester, antepartum on their problem list.  Patient reports no complaints.  Contractions: Not present. Vag. Bleeding: None.  Movement: Absent. Denies leaking of fluid.   The following portions of the patient's history were reviewed and updated as appropriate: allergies, current medications, past family history, past medical history, past social history, past surgical history and problem list.   Objective:   Vitals:   04/27/22 0927  BP: 115/78  Pulse: 80  Weight: 129 lb (58.5 kg)    Fetal Status: Fetal Heart Rate (bpm): 158   Movement: Absent     General:  Alert, oriented and cooperative. Patient is in no acute distress.  Skin: Skin is warm and dry. No rash noted.   Cardiovascular: Normal heart rate noted  Respiratory: Normal respiratory effort, no problems with respiration noted  Abdomen: Soft, gravid, appropriate for gestational age.  Pain/Pressure: Absent     Pelvic: Cervical exam deferred        Extremities: Normal range of motion.  Edema: None  Mental Status: Normal mood and affect. Normal behavior. Normal judgment and thought content.   Assessment and Plan:  Pregnancy: [id number] 160w3d1. Supervision of other normal pregnancy, antepartum  - Alpha fetoprotein, maternal  2. Systolic heart failure, unspecified HF chronicity  ([place] - Status post pyelonephritis. She is doing well, feeling better. Symptoms have resolved She is still taking her antibiotics and Metoprolol.  - She is planning to keep Cards visit this month.   Preterm labor symptoms and general obstetric precautions including but not limited to vaginal bleeding, contractions, leaking of fluid and fetal movement were reviewed in detail with the patient. Please refer to After Visit Summary for other counseling recommendations.   No follow-ups on file.  Future Appointments  Date Time Provider [place]11/20/2023 11:40 AM PeFreada BergeronMD [facility]  05/22/2022  8:30 AM BhJulianne HandlerCNM [facility]  05/22/2022  2:05 PM [facility]    JeNoni SaupeNP

## 2022-04-30 ENCOUNTER — Telehealth: Payer: Self-pay | Admitting: *Deleted

## 2022-04-30 LAB — ALPHA FETOPROTEIN, MATERNAL
AFP MoM: 0.56
AFP, Serum: 21.2 ng/mL
Calc'd Gestational Age: 16.4 weeks
Maternal Wt: 129 [lb_av]
Risk for ONTD: <1
Twins-AFP: 1

## 2022-04-30 MED ORDER — TERCONAZOLE 0.4 % VA CREA: TOPICAL_CREAM | VAGINAL | 0 refills | Status: DC

## 2022-04-30 NOTE — Telephone Encounter (Signed)
Pt stated that she is on an antibiotic and now has d/c and vaginal itching.  Per VO J Rasch,NP Terazol 7 sent to pt's pharmacy.

## 2022-05-05 NOTE — Progress Notes (Unsigned)
Cardio-Obstetrics Clinic  Follow Up Note   Date:  05/07/2022   ID:  [name], DOB 1986/04/15, MRN [id number]  PCP:  Pcp, No   [facility] Providers Cardiologist:  None  Electrophysiologist:  None       Referring MD: No ref. provider found   Chief Complaint: Moderate to severe MR  History of Present Illness:    [name] is a 36 y.o. female [[id number]] who returns for follow up of moderate to severe MR.  Patient was hospitalized in 04/2022 for pyelonephritis.  On admission, she was noted to by tachycardic and ECG was performed which revealed sinus tachycardia with borderline LVH and inferolateral TWI. Cardiology was called overnight and TTE was recommended which revealed  LVEF 40% with apical lateral hypokinesis, normal RV, mildly thickened and calcified mitral valve leaflet (abnormal for age) but no definitive post-inflammatory changes with moderate to severe MR. Trops negative x2. BNP 109. She appeared euvolemic on exam with no chest pain or SOB. We started metoprolol 12.'5mg'$  XL daily.  Today, the patient is currently 17w 6d pregnant. She denies any chest pain. Continues to have shortness of breath on exertion and mild dyspnea while laying down at night. She is still able to sleep flat on one pillow with no PND or LE edema.  Occasional palpitations and lightheadedness, which are brief and not sustained. No syncope. Overall, feels well and morning sickness has improved from first trimester.  Notably, had pre-eclampsia with prior pregnancies at time of delivery. Resolved with delivery and did not require BP meds post-pregnancy.   Prior CV Studies Reviewed: The following studies were reviewed today: TTE 05-07-22: IMPRESSIONS     1. Left ventricular ejection fraction, by estimation, is 40%. Left  ventricular ejection fraction by 3D volume is 40 %. The left ventricle has  mild to moderately decreased function. The left ventricle demonstrates  global  hypokinesis and regional wall  motion abnormalities (see scoring diagram/findings for description). The  left ventricular internal cavity size was mildly dilated. Left ventricular  diastolic parameters were normal.   2. Right ventricular systolic function is normal. The right ventricular  size is normal. Tricuspid regurgitation signal is inadequate for assessing  PA pressure.   3. The mitral valve is grossly normal. Moderate to severe mitral valve  regurgitation. The mitral valve is mildly thickened for age, but no  definite post inflammatory change unless clinical relevance. Suspect  mechanism is secondary MR due to LV  dysfunction. No evidence of mitral stenosis.   4. The aortic valve was not well visualized. Aortic valve regurgitation  is not visualized. No aortic stenosis is present.   5. The inferior vena cava is normal in size with greater than 50%  respiratory variability, suggesting right atrial pressure of 3 mmHg.     Past Medical History:  Diagnosis Date   Bacterial infection    History of chicken pox     Past Surgical History:  Procedure Laterality Date   CESAREAN SECTION     CESAREAN SECTION N/A 02/02/2015   Procedure: CESAREAN SECTION;  Surgeon: Thurnell Lose, MD;  Location: [facility];  Service: Obstetrics;  Laterality: N/A;   DILATION AND CURETTAGE OF UTERUS     WISDOM TOOTH EXTRACTION        OB History     Gravida  5   Para  2   Term  1   Preterm  1   AB  2   Living  2  SAB  1   IAB  1   Ectopic      Multiple  0   Live Births  2               Current Medications: Current Meds  Medication Sig   aspirin (ASPIRIN CHILDRENS) 81 MG chewable tablet Chew 1 tablet (81 mg total) by mouth daily.   metoprolol succinate (TOPROL-XL) 25 MG 24 hr tablet Take 0.5 tablets (12.5 mg total) by mouth at bedtime.   Prenatal MV & Min w/FA-DHA ([employer] PRENATAL GUMMY) 0.18-25 MG CHEW Chew 1 tablet by mouth daily.   promethazine (PHENERGAN) 25 MG tablet Take  1 tablet (25 mg total) by mouth every 6 (six) hours as needed for nausea or vomiting.   [DISCONTINUED] cefadroxil (DURICEF) 500 MG capsule Take 1 capsule (500 mg total) by mouth 2 (two) times daily.   [DISCONTINUED] terconazole (TERAZOL 7) 0.4 % vaginal cream Use 1 appicator vaginally every night for 7 nights.     Allergies:   Penicillins   Social History   Socioeconomic History   Marital status: Single    Spouse name: Not on file   Number of children: Not on file   Years of education: Not on file   Highest education level: Not on file  Occupational History   Not on file  Tobacco Use   Smoking status: Never   Smokeless tobacco: Never  Substance and Sexual Activity   Alcohol use: No   Drug use: No   Sexual activity: Yes    Birth control/protection: None  Other Topics Concern   Not on file  Social History Narrative   Not on file   Social Determinants of Health   Financial Resource Strain: Not on file  Food Insecurity: No Food Insecurity (04/22/2022)   Hunger Vital Sign    Worried About Running Out of Food in the Last Year: Never true    Ran Out of Food in the Last Year: Never true  Transportation Needs: No Transportation Needs (04/22/2022)   PRAPARE - Hydrologist (Medical): No    Lack of Transportation (Non-Medical): No  Physical Activity: Not on file  Stress: Not on file  Social Connections: Not on file      No family history on file.    ROS:   Please see the history of present illness.     All other systems reviewed and are negative.   Labs/EKG Reviewed:    EKG:   No new ECG  Recent Labs: 04/22/2022: ALT 51; B Natriuretic Peptide 109.9; BUN 5; Creatinine, Ser 0.61; Hemoglobin 9.9; Platelets 297; Potassium 3.8; Sodium 134   Recent Lipid Panel No results found for: "CHOL", "TRIG", "HDL", "CHOLHDL", "LDLCALC", "LDLDIRECT"  Physical Exam:    VS:  BP 122/76   Pulse 74   Ht '5\' 4"'$  (1.626 m)   Wt 131 lb (59.4 kg)   LMP  01/02/2022   SpO2 99%   BMI 22.49 kg/m     Wt Readings from Last 3 Encounters:  05/07/22 131 lb (59.4 kg)  04/27/22 129 lb (58.5 kg)  04/21/22 128 lb (58.1 kg)     GEN:  Well nourished, well developed in no acute distress HEENT: Normal NECK: No JVD; No carotid bruits CARDIAC: RRR, 2/6 systolic murmur heard throughout the precordium. No rubs or gallops RESPIRATORY:  Clear to auscultation without rales, wheezing or rhonchi  ABDOMEN: Soft, non-tender, non-distended MUSCULOSKELETAL:  No edema; No deformity  SKIN: Warm and  dry NEUROLOGIC:  Alert and oriented x 3 PSYCHIATRIC:  Normal affect    Risk Assessment/Risk Calculators:     CARPREG II Risk Prediction Index Score:  5.  The patient's risk for a primary cardiac event is 41%.   Modified World Health Organization [facility]) Classification of Maternal CV Risk   Class III     ASSESSMENT & PLAN:    #Systolic HF with mildly reduced EF: Patient admitted in 04/2022 for pyelonephritis with ECG revealing sinus tachycardia, possible LVH and inferior TWI. This abnormal ECG prompted TTE which revealed LVEF 40% with apical lateral hypokinesis, moderate-to-severe MR with mildly thickened valve for age. Unknown etiology as patient denies any personal or family history of CV disease, no known serious illnesses as a child or adult, and no history of hospitalization for volume overload/chest pain. States she had pre-eclampsia during first pregnancy at time of delivery but HTN resolved post-partum and she never required lasix. She reports chronic dyspnea on exertion that is worse with current pregnancy but no significant chest pain, palpitations, LE edema or orthopnea. Could consider stress CM in the setting of pyelonephritis but she has a mild case and this seems to be less likely. In the hospital, trop negative x2 and BNP 109 with no evidence of volume overload. Will plan to manage medically throughout pregnancy with close monitoring of echoes and regular  follow-up. Once she is no longer pregnancy, will proceed with more in-depth work-up including CMR and coronary CTA. -Discussed she is WHO class III with pregnancy, CARPREG score 5 corresponding to 40% chance of CV complications during pregnancy -Currently euvolemic and compensated -Continue metop 12.'5mg'$  XL daily; once no longer pregnant, can optimize meds further -Will need continued close monitoring of TTE going forward throughout pregnancy and will follow closely in cardio OB clinic -Once she is no longer pregnant, we can pursue further work-up with CMR/TEE for her valve and likely coronary CTA to assess coronary anatomy   #Moderate-to-severe MR: MV appears more thickened than would be expected for age but does not appear post-inflammatory. Denies any history of rheumatic fever, IVDU, blood stream infections, severe childhood illness or family history of disease. Likely this has been chronic and ongoing and patient is currently compensated and euvolemic on exam. Will manage HF as above. High risk of volume overload in pregnancy and will need to monitor her fluid status closely. She will be monitored with serial echoes.  Once no longer pregnant, will pursue further work-up.  -Continue metop 12.'5mg'$  XL daily -Continue serial monitoring with monthly echoes -Will need to watch volume status closely with pregnancy -Will pursue further work-up once patient is no longer pregnant as detailed above  #History of Pre-Eclampsia: #High Risk Pregnancy: -Continue ASA '81mg'$  daily -BP controlled -Management per OBGYN -Will discuss patient with high risk cardio-OB team   Patient Instructions  Medication Instructions:   Your physician recommends that you continue on your current medications as directed. Please refer to the Current Medication list given to you today.  *If you need a refill on your cardiac medications before your next appointment, please call your pharmacy*   Follow-Up:  2 WEEKS WITH DR.  Johney Frame EITHER HERE OR AT [facility]--OK TO TAKE ANY OPEN SLOT PER DR. Johney Frame    Important Information About Sugar         Dispo:  No follow-ups on file.   Medication Adjustments/Labs and Tests Ordered: Current medicines are reviewed at length with the patient today.  Concerns regarding medicines are outlined  above.  Tests Ordered: No orders of the defined types were placed in this encounter.  Medication Changes: No orders of the defined types were placed in this encounter.

## 2022-05-07 ENCOUNTER — Encounter: Payer: Self-pay | Admitting: *Deleted

## 2022-05-07 ENCOUNTER — Encounter: Payer: Self-pay | Admitting: Cardiology

## 2022-05-07 ENCOUNTER — Ambulatory Visit: Payer: 59 | Attending: Cardiology | Admitting: Cardiology

## 2022-05-07 VITALS — BP 122/76 | HR 74 | Ht 64.0 in | Wt 131.0 lb

## 2022-05-07 DIAGNOSIS — O0992 Supervision of high risk pregnancy, unspecified, second trimester: Secondary | ICD-10-CM

## 2022-05-07 DIAGNOSIS — I340 Nonrheumatic mitral (valve) insufficiency: Secondary | ICD-10-CM | POA: Diagnosis not present

## 2022-05-07 DIAGNOSIS — O99412 Diseases of the circulatory system complicating pregnancy, second trimester: Secondary | ICD-10-CM | POA: Diagnosis not present

## 2022-05-07 DIAGNOSIS — I5020 Unspecified systolic (congestive) heart failure: Secondary | ICD-10-CM | POA: Diagnosis not present

## 2022-05-07 DIAGNOSIS — Z8759 Personal history of other complications of pregnancy, childbirth and the puerperium: Secondary | ICD-10-CM

## 2022-05-07 NOTE — Patient Instructions (Signed)
Medication Instructions:   Your physician recommends that you continue on your current medications as directed. Please refer to the Current Medication list given to you today.  *If you need a refill on your cardiac medications before your next appointment, please call your pharmacy*   Follow-Up:  2 WEEKS WITH DR. Johney Frame EITHER HERE OR AT [facility]--OK TO TAKE ANY OPEN SLOT PER DR. Johney Frame    Important Information About Sugar

## 2022-05-08 ENCOUNTER — Telehealth: Payer: Self-pay | Admitting: Cardiology

## 2022-05-08 NOTE — Telephone Encounter (Signed)
Roslyn, Else - 05/08/2022 10:34 AM Freada Bergeron, MD  Sent: Tue May 08, 2022 12:45 PM  To: Nuala Alpha, LPN         Message  According to the [facility] note it was:     She is unable to lift more than 15lbs at this time and her OB will release this restriction when/if appropriate in the future.

## 2022-05-08 NOTE — Telephone Encounter (Signed)
Employer is calling in to see what does light duty consist for patient. Please advise

## 2022-05-08 NOTE — Telephone Encounter (Signed)
Returned a call back to pt and her work of place.   Made all parties aware that Dr. Johney Frame did not write for the pt to have light duty restriction from a cardiac perspective.  All aware that the light duty restriction came from the pts OBGYN, so they should refer to them for further assistance with this.  Advised them to reach out to the pts OBGYN for this need.   All verbalized understanding and agrees with this plan.

## 2022-05-08 NOTE — Telephone Encounter (Signed)
Will need to refer this message to Dr. Johney Frame for further advisement.   Will follow-up accordingly thereafter.

## 2022-05-22 ENCOUNTER — Ambulatory Visit: Payer: 59 | Admitting: *Deleted

## 2022-05-22 ENCOUNTER — Ambulatory Visit (INDEPENDENT_AMBULATORY_CARE_PROVIDER_SITE_OTHER): Payer: 59 | Admitting: Certified Nurse Midwife

## 2022-05-22 ENCOUNTER — Other Ambulatory Visit: Payer: Self-pay

## 2022-05-22 ENCOUNTER — Other Ambulatory Visit: Payer: Self-pay | Admitting: *Deleted

## 2022-05-22 ENCOUNTER — Ambulatory Visit (HOSPITAL_BASED_OUTPATIENT_CLINIC_OR_DEPARTMENT_OTHER): Payer: 59 | Admitting: Maternal & Fetal Medicine

## 2022-05-22 ENCOUNTER — Ambulatory Visit (HOSPITAL_BASED_OUTPATIENT_CLINIC_OR_DEPARTMENT_OTHER): Payer: 59

## 2022-05-22 ENCOUNTER — Ambulatory Visit: Payer: 59 | Attending: Cardiology

## 2022-05-22 VITALS — BP 115/79 | HR 89

## 2022-05-22 VITALS — BP 124/82 | HR 80 | Wt 132.0 lb

## 2022-05-22 DIAGNOSIS — O99412 Diseases of the circulatory system complicating pregnancy, second trimester: Secondary | ICD-10-CM | POA: Insufficient documentation

## 2022-05-22 DIAGNOSIS — Z3A20 20 weeks gestation of pregnancy: Secondary | ICD-10-CM

## 2022-05-22 DIAGNOSIS — O34219 Maternal care for unspecified type scar from previous cesarean delivery: Secondary | ICD-10-CM | POA: Diagnosis not present

## 2022-05-22 DIAGNOSIS — Z3401 Encounter for supervision of normal first pregnancy, first trimester: Secondary | ICD-10-CM

## 2022-05-22 DIAGNOSIS — O09899 Supervision of other high risk pregnancies, unspecified trimester: Secondary | ICD-10-CM

## 2022-05-22 DIAGNOSIS — O358XX0 Maternal care for other (suspected) fetal abnormality and damage, not applicable or unspecified: Secondary | ICD-10-CM | POA: Diagnosis not present

## 2022-05-22 DIAGNOSIS — O09521 Supervision of elderly multigravida, first trimester: Secondary | ICD-10-CM

## 2022-05-22 DIAGNOSIS — Z8759 Personal history of other complications of pregnancy, childbirth and the puerperium: Secondary | ICD-10-CM

## 2022-05-22 DIAGNOSIS — O99891 Other specified diseases and conditions complicating pregnancy: Secondary | ICD-10-CM

## 2022-05-22 DIAGNOSIS — Z148 Genetic carrier of other disease: Secondary | ICD-10-CM

## 2022-05-22 DIAGNOSIS — O09292 Supervision of pregnancy with other poor reproductive or obstetric history, second trimester: Secondary | ICD-10-CM | POA: Diagnosis not present

## 2022-05-22 DIAGNOSIS — O09212 Supervision of pregnancy with history of pre-term labor, second trimester: Secondary | ICD-10-CM | POA: Insufficient documentation

## 2022-05-22 DIAGNOSIS — O09522 Supervision of elderly multigravida, second trimester: Secondary | ICD-10-CM

## 2022-05-22 DIAGNOSIS — I5020 Unspecified systolic (congestive) heart failure: Secondary | ICD-10-CM

## 2022-05-22 DIAGNOSIS — I340 Nonrheumatic mitral (valve) insufficiency: Secondary | ICD-10-CM

## 2022-05-22 DIAGNOSIS — Z363 Encounter for antenatal screening for malformations: Secondary | ICD-10-CM

## 2022-05-22 DIAGNOSIS — O418X11 Other specified disorders of amniotic fluid and membranes, first trimester, fetus 1: Secondary | ICD-10-CM

## 2022-05-22 DIAGNOSIS — O09529 Supervision of elderly multigravida, unspecified trimester: Secondary | ICD-10-CM

## 2022-05-22 DIAGNOSIS — R42 Dizziness and giddiness: Secondary | ICD-10-CM

## 2022-05-22 DIAGNOSIS — O0992 Supervision of high risk pregnancy, unspecified, second trimester: Secondary | ICD-10-CM

## 2022-05-22 DIAGNOSIS — O0990 Supervision of high risk pregnancy, unspecified, unspecified trimester: Secondary | ICD-10-CM

## 2022-05-22 DIAGNOSIS — O468X1 Other antepartum hemorrhage, first trimester: Secondary | ICD-10-CM | POA: Insufficient documentation

## 2022-05-22 LAB — ECHOCARDIOGRAM COMPLETE
Area-P 1/2: 3.54 cm2
MV M vel: 5.09 m/s
MV Peak grad: 103.6 mmHg
S' Lateral: 4.4 cm
Weight: 2112 oz

## 2022-05-22 NOTE — Progress Notes (Addendum)
Subjective:  [name] is a 36 y.o. [id number] at 71w0dbeing seen today for ongoing prenatal care.  She is currently monitored for the following issues for this high-risk pregnancy and has Supervision of high risk pregnancy, antepartum; History of gestational hypertension; Subchorionic hematoma in first trimester; Multigravida of advanced maternal age in first trimester; History of preterm delivery, currently pregnant; UTI in pregnancy; Abnormal Pap smear of cervix; Pyelonephritis affecting pregnancy in second trimester; Systolic heart failure ([place]; Maternal mitral valve regurgitation affecting pregnancy in second trimester, antepartum; and Previous cesarean delivery affecting pregnancy on their problem list.  Patient reports  dizziness this morning. Has not had anything to eat but had glass of milk. Lips feel dry but states that normal for her .  Contractions: Not present. Vag. Bleeding: None.  Movement: Present. Denies leaking of fluid.   The following portions of the patient's history were reviewed and updated as appropriate: allergies, current medications, past family history, past medical history, past social history, past surgical history and problem list. Problem list updated.  Objective:   Vitals:   05/22/22 0830  BP: 124/82  Pulse: 80  Weight: 132 lb (59.9 kg)    Fetal Status: Fetal Heart Rate (bpm): 155   Movement: Present     General:  Alert, oriented and cooperative. Patient is in no acute distress.  Skin: Skin is warm and dry. No rash noted.   Cardiovascular: Normal heart rate noted  Respiratory: Normal respiratory effort, no problems with respiration noted  Abdomen: Soft, gravid, appropriate for gestational age. Pain/Pressure: Absent     Pelvic: Vag. Bleeding: None Vag D/C Character: Thin   Cervical exam deferred        Extremities: Normal range of motion.  Edema: None  Mental Status: Normal mood and affect. Normal behavior. Normal judgment and thought content.    Urinalysis:      Assessment and Plan:  Pregnancy: [id number] 230w0d1. Maternal mitral valve regurgitation affecting pregnancy in second trimester, antepartum -taking Toprol -followed by Cards -has follow up next week  2. Multigravida of advanced maternal age in second trimester   3. Supervision of high risk pregnancy, antepartum - anatomy [place]  4. Systolic heart failure, unspecified HF chronicity ([place] 5. Previous cesarean delivery affecting pregnancy - previous x2  6. Dizziness - recommend increased water intake and eat more often - notify MD if sx persist  7. Abnormal papsmear -needs to schedule colpo  8. Hx of gHTN -continue bASA  9. [redacted] weeks gestation  Preterm labor symptoms and general obstetric precautions including but not limited to vaginal bleeding, contractions, leaking of fluid and fetal movement were reviewed in detail with the patient. Please refer to After Visit Summary for other counseling recommendations.  Return in about 4 weeks (around 06/19/2022).   BhJulianne HandlerCNM

## 2022-05-22 NOTE — Progress Notes (Addendum)
MFM Consult Note Patient Name: [name]  Patient MRN:   [id number]  Referring provider: Jule Ser  Reason for Consult: mitral valve regurgitation    HPI: [name] is a 36 y.o. [id number] at 75w0dhere for ultrasound and consultation.   CEmmanuelahas a history of mod-sev MR that was discovered during hospitalization for pyelo on 11/23. On admission, she was noted to by tachycardic and ECG was performed which revealed sinus tachycardia with borderline LVH and inferolateral TWI. Cardiology was consulted and TTE was recommended which revealed  LVEF 40% with apical lateral hypokinesis, normal RV, mildly thickened and calcified mitral valve leaflet (abnormal for age) but no definitive post-inflammatory changes with moderate to severe MR. Trops negative x2. BNP 109. She was started on metoprolol 12.'5mg'$  XL daily by Dr. PJohney Frame(cardiology).    Today, she denies any chest pain, worsening SOB  or reduced exercise status. She continues to have some shortness of breath on exertion and mild dyspnea while laying down at night. She is still able to sleep flat on one pillow with no PND or LE edema. She has occasional palpitations and lightheadedness, which are brief and not sustained. She denies syncope and feels well overall.    She has a history of gestational hypertension and is compliant with Aspirin.   She had two prior CD and plans for repeat.   Review of Systems: A review of systems was performed and was negative except per HPI   Vitals and Physical Exam 115/79 Sitting comfortably on the sonogram table Nonlabored breathing Normal rate and rhythm Abdomen is non-tender  Sonographic findings Single intrauterine pregnancy. Observed fetal cardiac activity. Breech presentation. Fetal anatomy that was well seen appears normal without evidence of soft markers. Not all fetal structures were well seen due to a technically diffcult exam and the anatomic survey remains incomplete with limited  views of the alate, heels, DA,3VT, VC, spine. Fetal biometry shows the estimated fetal weight at the 36 percentile.  Amniotic fluid volume: Within normal limits. Placenta: Posterior. There are no signs of placental accreta spectrum on today's ultrasound.  Cervix: Normal appearance by transabdominal scan with a cervical length of 3.6 cm. Adnexa: No abnormality visualized.  Assessment - Maternal mitral valve regurgitation with a reduced EF Plan - I counseled her about her heart condition in pregnancy, she is WHO class III and CARPREG class II - It is key to avoid too much or too little volume (dehydration) to prevent pulmonary edema and arrhythmias; maintaining a normal heart rate is also very important to prevent arrhythmia. Due to the postdelivery autotransfusion that occurs after the placenta is delivered, fluids should be judiciously used to prevent flash pulmonary edema.  - She should follow with cardiology (Dr. PJohney Frame during her pregnancy with serial echos and EKG (at least once every trimester and with concern for worsening cardiac status).  - Anesthesia consult in the third trimester to discuss type of neuraxial amnalgesia for her cesarean delivery.  - Continue serial growth ultrasounds - Continue aspirin for preeclampsia prophylaxis - 39 week delivery or sooner if indicated (may need to be around 37 weeks if symptoms are worsening or cardiac function deteriorates) - She will be added to Red Chart Rounds  I spent 45 minutes reviewing the patients chart, including labs and images as well as counseling the patient about her medical conditions.  BValeda Malm MFM, CAddison  05/22/2022  12:53 PM

## 2022-05-23 ENCOUNTER — Encounter (HOSPITAL_COMMUNITY): Payer: Self-pay

## 2022-05-23 ENCOUNTER — Telehealth: Payer: Self-pay | Admitting: *Deleted

## 2022-05-23 DIAGNOSIS — I340 Nonrheumatic mitral (valve) insufficiency: Secondary | ICD-10-CM

## 2022-05-23 NOTE — Telephone Encounter (Signed)
The patient has been notified of the result and verbalized understanding.  All questions (if any) were answered.  Pt aware she will need another OB Echo done in one month for surveillance.   She is aware that I will go ahead and place the order in the system and have our Echo Scheduler call her back to arrange this appt.   Pt verbalized understanding and agrees with this plan.

## 2022-05-23 NOTE — Telephone Encounter (Signed)
-----   Message from Freada Bergeron, MD sent at 05/22/2022  7:25 PM EST ----- Her echo looks stable from prior with EF 45%, moderate-to-severe leakiness of her mitral valve. Will continue with serial monitoring with next echo in 1 month. Once she is no longer pregnant, we can work this up further.

## 2022-05-24 NOTE — Progress Notes (Deleted)
Cardio-Obstetrics Clinic  Follow Up Note   Date:  05/24/2022   ID:  [name], DOB 1986-04-26, MRN [id number]  PCP:  Pcp, No   [facility] Providers Cardiologist:  None  Electrophysiologist:  None       Referring MD: No ref. provider found   Chief Complaint: Moderate to severe MR  History of Present Illness:    [name] is a 36 y.o. female [G5P1122] who returns for follow up of moderate to severe MR.  Patient was hospitalized in 04/2022 for pyelonephritis.  On admission, she was noted to by tachycardic and ECG was performed which revealed sinus tachycardia with borderline LVH and inferolateral TWI. Cardiology was called overnight and TTE was recommended which revealed  LVEF 40% with apical lateral hypokinesis, normal RV, mildly thickened and calcified mitral valve leaflet (abnormal for age) but no definitive post-inflammatory changes with moderate to severe MR. Trops negative x2. BNP 109. She appeared euvolemic on exam with no chest pain or SOB. We started metoprolol 12.'5mg'$  XL daily.  Was last seen in clinic on 05/07/22 where she was doing well from a CV standpoint. No HF symptoms. TTE 05/22/22 stable with LVEF 40-45%, normal RV, moderate to severe MR.   Today, ***   Notably, had pre-eclampsia with prior pregnancies at time of delivery. Resolved with delivery and did not require BP meds post-pregnancy.   Prior CV Studies Reviewed: The following studies were reviewed today: TTE 04/30/22: IMPRESSIONS     1. Left ventricular ejection fraction, by estimation, is 40%. Left  ventricular ejection fraction by 3D volume is 40 %. The left ventricle has  mild to moderately decreased function. The left ventricle demonstrates  global hypokinesis and regional wall  motion abnormalities (see scoring diagram/findings for description). The  left ventricular internal cavity size was mildly dilated. Left ventricular  diastolic parameters were normal.   2. Right  ventricular systolic function is normal. The right ventricular  size is normal. Tricuspid regurgitation signal is inadequate for assessing  PA pressure.   3. The mitral valve is grossly normal. Moderate to severe mitral valve  regurgitation. The mitral valve is mildly thickened for age, but no  definite post inflammatory change unless clinical relevance. Suspect  mechanism is secondary MR due to LV  dysfunction. No evidence of mitral stenosis.   4. The aortic valve was not well visualized. Aortic valve regurgitation  is not visualized. No aortic stenosis is present.   5. The inferior vena cava is normal in size with greater than 50%  respiratory variability, suggesting right atrial pressure of 3 mmHg.     Past Medical History:  Diagnosis Date   Bacterial infection    History of chicken pox    Systolic heart failure [facility])     Past Surgical History:  Procedure Laterality Date   CESAREAN SECTION     CESAREAN SECTION N/A 02/02/2015   Procedure: CESAREAN SECTION;  Surgeon: Thurnell Lose, MD;  Location: [facility];  Service: Obstetrics;  Laterality: N/A;   DILATION AND CURETTAGE OF UTERUS     WISDOM TOOTH EXTRACTION        OB History     Gravida  5   Para  2   Term  1   Preterm  1   AB  2   Living  2      SAB  1   IAB  1   Ectopic      Multiple  0   Live Births  2  Current Medications: No outpatient medications have been marked as taking for the 05/25/22 encounter (Appointment) with Freada Bergeron, MD.     Allergies:   Penicillins   Social History   Socioeconomic History   Marital status: Single    Spouse name: Not on file   Number of children: Not on file   Years of education: Not on file   Highest education level: Not on file  Occupational History   Not on file  Tobacco Use   Smoking status: Never   Smokeless tobacco: Never  Vaping Use   Vaping Use: Not on file  Substance and Sexual Activity   Alcohol use: No   Drug use:  No   Sexual activity: Yes    Birth control/protection: None  Other Topics Concern   Not on file  Social History Narrative   Not on file   Social Determinants of Health   Financial Resource Strain: Not on file  Food Insecurity: No Food Insecurity (04/22/2022)   Hunger Vital Sign    Worried About Running Out of Food in the Last Year: Never true    Ran Out of Food in the Last Year: Never true  Transportation Needs: No Transportation Needs (04/22/2022)   PRAPARE - Hydrologist (Medical): No    Lack of Transportation (Non-Medical): No  Physical Activity: Not on file  Stress: Not on file  Social Connections: Not on file      Family History  Problem Relation Age of Onset   Hypertension Mother    Hypertension Father    Diabetes Father    Hypertension Paternal Aunt    Asthma Neg Hx    Cancer Neg Hx    Heart disease Neg Hx    Stroke Neg Hx       ROS:   Please see the history of present illness.     All other systems reviewed and are negative.   Labs/EKG Reviewed:    EKG:   No new ECG  Recent Labs: 04/22/2022: ALT 51; B Natriuretic Peptide 109.9; BUN 5; Creatinine, Ser 0.61; Hemoglobin 9.9; Platelets 297; Potassium 3.8; Sodium 134   Recent Lipid Panel No results found for: "CHOL", "TRIG", "HDL", "CHOLHDL", "LDLCALC", "LDLDIRECT"  Physical Exam:    VS:  LMP 01/02/2022     Wt Readings from Last 3 Encounters:  05/22/22 132 lb (59.9 kg)  05/07/22 131 lb (59.4 kg)  04/27/22 129 lb (58.5 kg)     GEN:  Well nourished, well developed in no acute distress HEENT: Normal NECK: No JVD; No carotid bruits CARDIAC: RRR, 2/6 systolic murmur heard throughout the precordium. No rubs or gallops RESPIRATORY:  Clear to auscultation without rales, wheezing or rhonchi  ABDOMEN: Soft, non-tender, non-distended MUSCULOSKELETAL:  No edema; No deformity  SKIN: Warm and dry NEUROLOGIC:  Alert and oriented x 3 PSYCHIATRIC:  Normal affect    Risk  Assessment/Risk Calculators:              ASSESSMENT & PLAN:    #Systolic HF with mildly reduced EF: Patient admitted in 04/2022 for pyelonephritis with ECG revealing sinus tachycardia, possible LVH and inferior TWI. This abnormal ECG prompted TTE which revealed LVEF 40% with apical lateral hypokinesis, moderate-to-severe MR with mildly thickened valve for age. Unknown etiology as patient denies any personal or family history of CV disease, no known serious illnesses as a child or adult, and no history of hospitalization for volume overload/chest pain. States she had pre-eclampsia during first  pregnancy at time of delivery but HTN resolved post-partum and she never required lasix. She reports chronic dyspnea on exertion that is worse with current pregnancy but no significant chest pain, palpitations, LE edema or orthopnea. In the hospital, trop negative x2 and BNP 109 with no evidence of volume overload. Will plan to manage medically throughout pregnancy with close monitoring of echoes and regular follow-up. Once she is no longer pregnancy, will proceed with more in-depth work-up including TEE/CMR and coronary CTA. -Discussed she is WHO class III with pregnancy, CARPREG score 5 corresponding to 40% chance of CV complications during pregnancy -Currently euvolemic and compensated -Continue metop 12.'5mg'$  XL daily; once no longer pregnant, can optimize meds further -Continue serial monitoring of TTE monthly -Once she is no longer pregnant, we can pursue further work-up with CMR/TEE for her valve and likely coronary CTA to assess coronary anatomy   #Moderate-to-severe MR: MV appears more thickened than would be expected for age but does not appear post-inflammatory. Denies any history of rheumatic fever, IVDU, blood stream infections, severe childhood illness or family history of disease. Likely this has been chronic and ongoing and patient is currently compensated and euvolemic on exam. Will manage HF  as above. High risk of volume overload in pregnancy and will need to monitor her fluid status closely. She will be monitored with serial echoes.  Once no longer pregnant, will pursue further work-up.  -Continue metop 12.'5mg'$  XL daily -Continue serial monitoring with monthly echoes -Will need to watch volume status closely with pregnancy -Will pursue further work-up once patient is no longer pregnant as detailed above  #History of Pre-Eclampsia: #High Risk Pregnancy: -Continue ASA '81mg'$  daily -BP controlled -Management per OBGYN -Will discuss patient with high risk cardio-OB team   There are no Patient Instructions on file for this visit.   Dispo:  No follow-ups on file.   Medication Adjustments/Labs and Tests Ordered: Current medicines are reviewed at length with the patient today.  Concerns regarding medicines are outlined above.  Tests Ordered: No orders of the defined types were placed in this encounter.  Medication Changes: No orders of the defined types were placed in this encounter.

## 2022-05-25 ENCOUNTER — Encounter: Payer: Self-pay | Admitting: Cardiology

## 2022-05-25 ENCOUNTER — Encounter: Payer: Self-pay | Admitting: *Deleted

## 2022-05-25 ENCOUNTER — Ambulatory Visit: Payer: 59 | Attending: Cardiology | Admitting: Cardiology

## 2022-05-25 ENCOUNTER — Other Ambulatory Visit: Payer: Self-pay | Admitting: *Deleted

## 2022-05-25 VITALS — BP 118/78 | HR 84 | Ht 64.0 in | Wt 132.0 lb

## 2022-05-25 DIAGNOSIS — O34219 Maternal care for unspecified type scar from previous cesarean delivery: Secondary | ICD-10-CM

## 2022-05-25 DIAGNOSIS — I5022 Chronic systolic (congestive) heart failure: Secondary | ICD-10-CM | POA: Diagnosis not present

## 2022-05-25 DIAGNOSIS — I340 Nonrheumatic mitral (valve) insufficiency: Secondary | ICD-10-CM | POA: Diagnosis not present

## 2022-05-25 DIAGNOSIS — O0992 Supervision of high risk pregnancy, unspecified, second trimester: Secondary | ICD-10-CM | POA: Diagnosis not present

## 2022-05-25 DIAGNOSIS — Z8759 Personal history of other complications of pregnancy, childbirth and the puerperium: Secondary | ICD-10-CM

## 2022-05-25 DIAGNOSIS — O09522 Supervision of elderly multigravida, second trimester: Secondary | ICD-10-CM

## 2022-05-25 DIAGNOSIS — Z3A20 20 weeks gestation of pregnancy: Secondary | ICD-10-CM

## 2022-05-25 NOTE — Progress Notes (Signed)
Cardio-Obstetrics Clinic  Follow Up Note   Date:  05/25/2022   ID:  [name], DOB [birth year], MRN [id number]  PCP:  Pcp, No   [facility] Providers Cardiologist:  None  Electrophysiologist:  None       Referring MD: No ref. provider found   Chief Complaint: Moderate to severe MR  History of Present Illness:    [name] is a 36 y.o. female [G5P1122] who returns for follow up of moderate to severe MR.  Patient was hospitalized in 04/2022 for pyelonephritis.  On admission, she was noted to by tachycardic and ECG was performed which revealed sinus tachycardia with borderline LVH and inferolateral TWI. Cardiology was called overnight and TTE was recommended which revealed  LVEF 40% with apical lateral hypokinesis, normal RV, mildly thickened and calcified mitral valve leaflet (abnormal for age) but no definitive post-inflammatory changes with moderate to severe MR. Trops negative x2. BNP 109. She appeared euvolemic on exam with no chest pain or SOB. We started metoprolol 12.'5mg'$  XL daily.  Was last seen in clinic on 05/07/22 where she was doing well from a CV standpoint. No HF symptoms. TTE 05/22/22 stable with LVEF 40-45%, normal RV, moderate to severe MR.   Notably, had pre-eclampsia with prior pregnancies at time of delivery. Resolved with delivery and did not require BP meds post-pregnancy.   Today, the patient states that she is feeling okay, besides pregnancy symptoms, such as tiredness.   Her SOB is stable and unchanged from baseline. No chest pain, palpitations, LE edema, orthopnea or PND. Her BP is well controlled. She is tolerating her ASA and metop.  She is wondering about FMLA paperwork. She has been having more fatigue on certain days. We discussed that she is stable from a CV standpoint but can discuss with her OBGYN further.   Prior CV Studies Reviewed: The following studies were reviewed today:  TTE 05/22/22: IMPRESSIONS     1. Left  ventricular ejection fraction, by estimation, is 40 to 45%. Left  ventricular ejection fraction by 3D volume is 45 %. The left ventricle has  mildly decreased function. The left ventricle demonstrates global  hypokinesis. The left ventricular  internal cavity size was mildly dilated. Left ventricular diastolic  parameters were normal.   2. Right ventricular systolic function is normal. The right ventricular  size is normal. Tricuspid regurgitation signal is inadequate for assessing  PA pressure.   3. The mitral valve is normal in structure. Moderate to severe mitral  valve regurgitation.   4. The aortic valve is tricuspid. Aortic valve regurgitation is not  visualized. No aortic stenosis is present.   5. The inferior vena cava is normal in size with greater than 50%  respiratory variability, suggesting right atrial pressure of 3 mmHg.   Comparison(s): No significant change from prior study. Prior images  reviewed side by side.   Conclusion(s)/Recommendation(s): Consider TEE to clarify mechanism and  severity of mitral insufficiency.    TTE 04/22/22: IMPRESSIONS     1. Left ventricular ejection fraction, by estimation, is 40%. Left  ventricular ejection fraction by 3D volume is 40 %. The left ventricle has  mild to moderately decreased function. The left ventricle demonstrates  global hypokinesis and regional wall  motion abnormalities (see scoring diagram/findings for description). The  left ventricular internal cavity size was mildly dilated. Left ventricular  diastolic parameters were normal.   2. Right ventricular systolic function is normal. The right ventricular  size is normal. Tricuspid regurgitation signal is  inadequate for assessing  PA pressure.   3. The mitral valve is grossly normal. Moderate to severe mitral valve  regurgitation. The mitral valve is mildly thickened for age, but no  definite post inflammatory change unless clinical relevance. Suspect  mechanism is  secondary MR due to LV  dysfunction. No evidence of mitral stenosis.   4. The aortic valve was not well visualized. Aortic valve regurgitation  is not visualized. No aortic stenosis is present.   5. The inferior vena cava is normal in size with greater than 50%  respiratory variability, suggesting right atrial pressure of 3 mmHg.     Past Medical History:  Diagnosis Date   Bacterial infection    History of chicken pox    Systolic heart failure [facility])     Past Surgical History:  Procedure Laterality Date   CESAREAN SECTION     CESAREAN SECTION N/A 02/02/2015   Procedure: CESAREAN SECTION;  Surgeon: Thurnell Lose, MD;  Location: [facility];  Service: Obstetrics;  Laterality: N/A;   DILATION AND CURETTAGE OF UTERUS     WISDOM TOOTH EXTRACTION        OB History     Gravida  5   Para  2   Term  1   Preterm  1   AB  2   Living  2      SAB  1   IAB  1   Ectopic      Multiple  0   Live Births  2               Current Medications: Current Meds  Medication Sig   aspirin (ASPIRIN CHILDRENS) 81 MG chewable tablet Chew 1 tablet (81 mg total) by mouth daily.   metoprolol succinate (TOPROL-XL) 25 MG 24 hr tablet Take 0.5 tablets (12.5 mg total) by mouth at bedtime.   Prenatal MV & Min w/FA-DHA ([employer] PRENATAL GUMMY) 0.18-25 MG CHEW Chew 1 tablet by mouth daily.     Allergies:   Penicillins   Social History   Socioeconomic History   Marital status: Single    Spouse name: Not on file   Number of children: Not on file   Years of education: Not on file   Highest education level: Not on file  Occupational History   Not on file  Tobacco Use   Smoking status: Never   Smokeless tobacco: Never  Vaping Use   Vaping Use: Not on file  Substance and Sexual Activity   Alcohol use: No   Drug use: No   Sexual activity: Yes    Birth control/protection: None  Other Topics Concern   Not on file  Social History Narrative   Not on file   Social Determinants of Health    Financial Resource Strain: Not on file  Food Insecurity: No Food Insecurity (04/22/2022)   Hunger Vital Sign    Worried About Running Out of Food in the Last Year: Never true    Ran Out of Food in the Last Year: Never true  Transportation Needs: No Transportation Needs (04/22/2022)   PRAPARE - Hydrologist (Medical): No    Lack of Transportation (Non-Medical): No  Physical Activity: Not on file  Stress: Not on file  Social Connections: Not on file      Family History  Problem Relation Age of Onset   Hypertension Mother    Hypertension Father    Diabetes Father    Hypertension Paternal Aunt  Asthma Neg Hx    Cancer Neg Hx    Heart disease Neg Hx    Stroke Neg Hx       ROS:   Please see the history of present illness.    Review of Systems  Constitutional:  Positive for malaise/fatigue. Negative for chills and fever.  HENT:  Negative for nosebleeds and tinnitus.   Eyes:  Negative for blurred vision and pain.  Respiratory:  Positive for shortness of breath (stable, unchanged from baseline). Negative for cough, hemoptysis and stridor.   Cardiovascular:  Negative for chest pain, palpitations, orthopnea, claudication, leg swelling and PND.  Gastrointestinal:  Negative for blood in stool, diarrhea, nausea and vomiting.  Genitourinary:  Negative for dysuria and hematuria.  Musculoskeletal:  Negative for falls.  Neurological:  Negative for dizziness, loss of consciousness and headaches.  Psychiatric/Behavioral:  Negative for depression, hallucinations and substance abuse. The patient does not have insomnia.     All other systems reviewed and are negative.   Labs/EKG Reviewed:    EKG:  EKG is personally reviewed. 05/25/22: EKG was not ordered. 04/22/22: Sinus tachycardia, possible Left atrial enlargement, T wave abnormality, consider lateral ischemia, abnormal ECG, 110 bpm   Recent Labs: 04/22/2022: ALT 51; B Natriuretic Peptide 109.9; BUN 5;  Creatinine, Ser 0.61; Hemoglobin 9.9; Platelets 297; Potassium 3.8; Sodium 134   Recent Lipid Panel No results found for: "CHOL", "TRIG", "HDL", "CHOLHDL", "LDLCALC", "LDLDIRECT"  Physical Exam:    VS:  BP 118/78   Pulse 84   Ht '5\' 4"'$  (1.626 m)   Wt 132 lb (59.9 kg)   LMP 01/02/2022   SpO2 97%   BMI 22.66 kg/m     Wt Readings from Last 3 Encounters:  05/25/22 132 lb (59.9 kg)  05/22/22 132 lb (59.9 kg)  05/07/22 131 lb (59.4 kg)     GEN:  Well nourished, well developed in no acute distress, comfortable HEENT: Normal NECK: No JVD; No carotid bruits CARDIAC: RRR, 2/6 systolic murmur heard throughout the precordium. No rubs or gallops RESPIRATORY:  Clear to auscultation without rales, wheezing or rhonchi  ABDOMEN: Gravid, nontender to palpation MUSCULOSKELETAL:  No edema; No deformity  SKIN: Warm and dry NEUROLOGIC:  Alert and oriented x 3 PSYCHIATRIC:  Normal affect    Risk Assessment/Risk Calculators:              ASSESSMENT & PLAN:    #Systolic HF with mildly reduced EF: Patient admitted in 04/2022 for pyelonephritis with ECG revealing sinus tachycardia, possible LVH and inferior TWI. This abnormal ECG prompted TTE which revealed LVEF 40% with apical lateral hypokinesis, moderate-to-severe MR with mildly thickened valve for age. Unknown etiology as patient denies any personal or family history of CV disease, no known serious illnesses as a child or adult, and no history of hospitalization for volume overload/chest pain. States she had pre-eclampsia during first pregnancy at time of delivery but HTN resolved post-partum and she never required lasix. She reports chronic dyspnea on exertion that is worse with current pregnancy but no significant chest pain, palpitations, LE edema or orthopnea. In the hospital, trop negative x2 and BNP 109 with no evidence of volume overload. Will plan to manage medically throughout pregnancy with close monitoring of echoes and regular  follow-up. Once she is no longer pregnancy, will proceed with more in-depth work-up including TEE/CMR and coronary CTA. -Discussed she is WHO class III with pregnancy, CARPREG score 5 corresponding to 40% chance of CV complications during pregnancy -Currently euvolemic and  compensated -Continue metop 12.'5mg'$  XL daily; once no longer pregnant, can optimize meds further -Continue serial monitoring of TTE monthly -Once she is no longer pregnant, we can pursue further work-up with CMR/TEE for her valve and likely coronary CTA to assess coronary anatomy   #Moderate-to-severe MR: MV appears more thickened than would be expected for age but does not appear post-inflammatory. Denies any history of rheumatic fever, IVDU, blood stream infections, severe childhood illness or family history of disease. Likely this has been chronic and ongoing and patient is currently compensated and euvolemic on exam. Will manage HF as above. High risk of volume overload in pregnancy and will need to monitor her fluid status closely. She will be monitored with serial echoes.  Once no longer pregnant, will pursue further work-up.  -Continue metop 12.'5mg'$  XL daily -Continue serial monitoring with monthly echoes -Will need to watch volume status closely with pregnancy -Will pursue further work-up once patient is no longer pregnant as detailed above  #History of Pre-Eclampsia: #High Risk Pregnancy: -Continue ASA '81mg'$  daily -BP controlled -Management per OBGYN -Followed by Cardio OB team   Patient Instructions  Medication Instructions:   Your physician recommends that you continue on your current medications as directed. Please refer to the Current Medication list given to you today.  *If you need a refill on your cardiac medications before your next appointment, please call your pharmacy*    Follow-Up:  3 WEEKS WITH DR. Johney Frame HERE AT [address]--SCHEDULE ON 06/13/22 AT 10 AM END SLOT--OK PER DR.  Johney Frame  Important Information About Sugar         Follow-up: 2 weeks  Medication Adjustments/Labs and Tests Ordered: Current medicines are reviewed at length with the patient today.  Concerns regarding medicines are outlined above.  Tests Ordered: No orders of the defined types were placed in this encounter.  Medication Changes: No orders of the defined types were placed in this encounter.     I,Mitra Faeizi,acting as a Education administrator for Freada Bergeron, MD.,have documented all relevant documentation on the behalf of Freada Bergeron, MD,as directed by  Freada Bergeron, MD while in the presence of Freada Bergeron, MD.  I, Freada Bergeron, MD, have reviewed all documentation for this visit. The documentation on 05/25/22 for the exam, diagnosis, procedures, and orders are all accurate and complete.

## 2022-05-25 NOTE — Progress Notes (Addendum)
Cardio-Obstetrics Clinic  Follow Up Note   Date:  05/25/2022   ID:  [name], DOB 03/19/1986, MRN [id number]  PCP:  Pcp, No   [facility] Providers Cardiologist:  None  Electrophysiologist:  None       Referring MD: No ref. provider found   Chief Complaint: Moderate to severe MR  History of Present Illness:    [name] is a 36 y.o. female [[id number]] who returns for follow up of moderate to severe MR.  Patient was hospitalized in 04/2022 for pyelonephritis.  On admission, she was noted to by tachycardic and ECG was performed which revealed sinus tachycardia with borderline LVH and inferolateral TWI. Cardiology was called overnight and TTE was recommended which revealed  LVEF 40% with apical lateral hypokinesis, normal RV, mildly thickened and calcified mitral valve leaflet (abnormal for age) but no definitive post-inflammatory changes with moderate to severe MR. Trops negative x2. BNP 109. She appeared euvolemic on exam with no chest pain or SOB. We started metoprolol 12.'5mg'$  XL daily.  Was last seen in clinic on 05/07/22 where she was doing well from a CV standpoint. No HF symptoms. TTE 05/22/22 stable with LVEF 40-45%, normal RV, moderate to severe MR.   Notably, had pre-eclampsia with prior pregnancies at time of delivery. Resolved with delivery and did not require BP meds post-pregnancy.   Today, the patient states that she is feeling okay, besides pregnancy symptoms, such as tiredness.   Her SOB is stable and unchanged from baseline. No chest pain, palpitations, LE edema, orthopnea or PND. Her BP is well controlled. She is tolerating her ASA and metop.  She is wondering about FMLA paperwork. She has been having more fatigue on certain days. We discussed that she is stable from a CV standpoint but can discuss with her OBGYN further.   Prior CV Studies Reviewed: The following studies were reviewed today:  TTE 05/22/22: IMPRESSIONS     1. Left  ventricular ejection fraction, by estimation, is 40 to 45%. Left  ventricular ejection fraction by 3D volume is 45 %. The left ventricle has  mildly decreased function. The left ventricle demonstrates global  hypokinesis. The left ventricular  internal cavity size was mildly dilated. Left ventricular diastolic  parameters were normal.   2. Right ventricular systolic function is normal. The right ventricular  size is normal. Tricuspid regurgitation signal is inadequate for assessing  PA pressure.   3. The mitral valve is normal in structure. Moderate to severe mitral  valve regurgitation.   4. The aortic valve is tricuspid. Aortic valve regurgitation is not  visualized. No aortic stenosis is present.   5. The inferior vena cava is normal in size with greater than 50%  respiratory variability, suggesting right atrial pressure of 3 mmHg.   Comparison(s): No significant change from prior study. Prior images  reviewed side by side.   Conclusion(s)/Recommendation(s): Consider TEE to clarify mechanism and  severity of mitral insufficiency.    TTE 04/22/22: IMPRESSIONS     1. Left ventricular ejection fraction, by estimation, is 40%. Left  ventricular ejection fraction by 3D volume is 40 %. The left ventricle has  mild to moderately decreased function. The left ventricle demonstrates  global hypokinesis and regional wall  motion abnormalities (see scoring diagram/findings for description). The  left ventricular internal cavity size was mildly dilated. Left ventricular  diastolic parameters were normal.   2. Right ventricular systolic function is normal. The right ventricular  size is normal. Tricuspid regurgitation signal is  inadequate for assessing  PA pressure.   3. The mitral valve is grossly normal. Moderate to severe mitral valve  regurgitation. The mitral valve is mildly thickened for age, but no  definite post inflammatory change unless clinical relevance. Suspect  mechanism is  secondary MR due to LV  dysfunction. No evidence of mitral stenosis.   4. The aortic valve was not well visualized. Aortic valve regurgitation  is not visualized. No aortic stenosis is present.   5. The inferior vena cava is normal in size with greater than 50%  respiratory variability, suggesting right atrial pressure of 3 mmHg.     Past Medical History:  Diagnosis Date   Bacterial infection    History of chicken pox    Systolic heart failure [facility])     Past Surgical History:  Procedure Laterality Date   CESAREAN SECTION     CESAREAN SECTION N/A 02/02/2015   Procedure: CESAREAN SECTION;  Surgeon: Thurnell Lose, MD;  Location: [facility];  Service: Obstetrics;  Laterality: N/A;   DILATION AND CURETTAGE OF UTERUS     WISDOM TOOTH EXTRACTION        OB History     Gravida  5   Para  2   Term  1   Preterm  1   AB  2   Living  2      SAB  1   IAB  1   Ectopic      Multiple  0   Live Births  2               Current Medications: Current Meds  Medication Sig   aspirin (ASPIRIN CHILDRENS) 81 MG chewable tablet Chew 1 tablet (81 mg total) by mouth daily.   metoprolol succinate (TOPROL-XL) 25 MG 24 hr tablet Take 0.5 tablets (12.5 mg total) by mouth at bedtime.   Prenatal MV & Min w/FA-DHA ([employer] PRENATAL GUMMY) 0.18-25 MG CHEW Chew 1 tablet by mouth daily.     Allergies:   Penicillins   Social History   Socioeconomic History   Marital status: Single    Spouse name: Not on file   Number of children: Not on file   Years of education: Not on file   Highest education level: Not on file  Occupational History   Not on file  Tobacco Use   Smoking status: Never   Smokeless tobacco: Never  Vaping Use   Vaping Use: Not on file  Substance and Sexual Activity   Alcohol use: No   Drug use: No   Sexual activity: Yes    Birth control/protection: None  Other Topics Concern   Not on file  Social History Narrative   Not on file   Social Determinants of Health    Financial Resource Strain: Not on file  Food Insecurity: No Food Insecurity (04/22/2022)   Hunger Vital Sign    Worried About Running Out of Food in the Last Year: Never true    Ran Out of Food in the Last Year: Never true  Transportation Needs: No Transportation Needs (04/22/2022)   PRAPARE - Hydrologist (Medical): No    Lack of Transportation (Non-Medical): No  Physical Activity: Not on file  Stress: Not on file  Social Connections: Not on file      Family History  Problem Relation Age of Onset   Hypertension Mother    Hypertension Father    Diabetes Father    Hypertension Paternal Aunt  Asthma Neg Hx    Cancer Neg Hx    Heart disease Neg Hx    Stroke Neg Hx       ROS:   Please see the history of present illness.    Review of Systems  Constitutional:  Positive for malaise/fatigue. Negative for chills and fever.  HENT:  Negative for nosebleeds and tinnitus.   Eyes:  Negative for blurred vision and pain.  Respiratory:  Positive for shortness of breath (stable, unchanged from baseline). Negative for cough, hemoptysis and stridor.   Cardiovascular:  Negative for chest pain, palpitations, orthopnea, claudication, leg swelling and PND.  Gastrointestinal:  Negative for blood in stool, diarrhea, nausea and vomiting.  Genitourinary:  Negative for dysuria and hematuria.  Musculoskeletal:  Negative for falls.  Neurological:  Negative for dizziness, loss of consciousness and headaches.  Psychiatric/Behavioral:  Negative for depression, hallucinations and substance abuse. The patient does not have insomnia.     All other systems reviewed and are negative.   Labs/EKG Reviewed:    EKG:  EKG is personally reviewed. 05/25/22: EKG was not ordered. 04/22/22: Sinus tachycardia, possible Left atrial enlargement, T wave abnormality, consider lateral ischemia, abnormal ECG, 110 bpm   Recent Labs: 04/22/2022: ALT 51; B Natriuretic Peptide 109.9; BUN 5;  Creatinine, Ser 0.61; Hemoglobin 9.9; Platelets 297; Potassium 3.8; Sodium 134   Recent Lipid Panel No results found for: "CHOL", "TRIG", "HDL", "CHOLHDL", "LDLCALC", "LDLDIRECT"  Physical Exam:    VS:  BP 118/78   Pulse 84   Ht '5\' 4"'$  (1.626 m)   Wt 132 lb (59.9 kg)   LMP 01/02/2022   SpO2 97%   BMI 22.66 kg/m     Wt Readings from Last 3 Encounters:  05/25/22 132 lb (59.9 kg)  05/22/22 132 lb (59.9 kg)  05/07/22 131 lb (59.4 kg)     GEN:  Well nourished, well developed in no acute distress, comfortable HEENT: Normal NECK: No JVD; No carotid bruits CARDIAC: RRR, 2/6 systolic murmur heard throughout the precordium. No rubs or gallops RESPIRATORY:  Clear to auscultation without rales, wheezing or rhonchi  ABDOMEN: Gravid, nontender to palpation MUSCULOSKELETAL:  No edema; No deformity  SKIN: Warm and dry NEUROLOGIC:  Alert and oriented x 3 PSYCHIATRIC:  Normal affect    Risk Assessment/Risk Calculators:              ASSESSMENT & PLAN:    #Systolic HF with mildly reduced EF: Patient admitted in 04/2022 for pyelonephritis with ECG revealing sinus tachycardia, possible LVH and inferior TWI. This abnormal ECG prompted TTE which revealed LVEF 40% with apical lateral hypokinesis, moderate-to-severe MR with mildly thickened valve for age. Unknown etiology as patient denies any personal or family history of CV disease, no known serious illnesses as a child or adult, and no history of hospitalization for volume overload/chest pain. States she had pre-eclampsia during first pregnancy at time of delivery but HTN resolved post-partum and she never required lasix. She reports chronic dyspnea on exertion that is worse with current pregnancy but no significant chest pain, palpitations, LE edema or orthopnea. In the hospital, trop negative x2 and BNP 109 with no evidence of volume overload. Will plan to manage medically throughout pregnancy with close monitoring of echoes and regular  follow-up. Once she is no longer pregnancy, will proceed with more in-depth work-up including TEE/CMR and coronary CTA. -Discussed she is WHO class III with pregnancy, CARPREG score 5 corresponding to 40% chance of CV complications during pregnancy -Currently euvolemic and  compensated -Continue metop 12.'5mg'$  XL daily; once no longer pregnant, can optimize meds further -Continue serial monitoring of TTE monthly -Once she is no longer pregnant, we can pursue further work-up with CMR/TEE for her valve and likely coronary CTA to assess coronary anatomy   #Moderate-to-severe MR: MV appears more thickened than would be expected for age but does not appear post-inflammatory. Denies any history of rheumatic fever, IVDU, blood stream infections, severe childhood illness or family history of disease. Likely this has been chronic and ongoing and patient is currently compensated and euvolemic on exam. Will manage HF as above. High risk of volume overload in pregnancy and will need to monitor her fluid status closely. She will be monitored with serial echoes.  Once no longer pregnant, will pursue further work-up.  -Continue metop 12.'5mg'$  XL daily -Continue serial monitoring with monthly echoes -Will need to watch volume status closely with pregnancy -Will pursue further work-up once patient is no longer pregnant as detailed above  #History of Pre-Eclampsia: #High Risk Pregnancy: -Continue ASA '81mg'$  daily -BP controlled -Management per OBGYN -Followed by Cardio OB team   Patient Instructions  Medication Instructions:   Your physician recommends that you continue on your current medications as directed. Please refer to the Current Medication list given to you today.  *If you need a refill on your cardiac medications before your next appointment, please call your pharmacy*    Follow-Up:  3 WEEKS WITH DR. Johney Frame HERE AT [address]--SCHEDULE ON 06/13/22 AT 10 AM END SLOT--OK PER DR.  Johney Frame  Important Information About Sugar         Follow-up: 2 weeks  Medication Adjustments/Labs and Tests Ordered: Current medicines are reviewed at length with the patient today.  Concerns regarding medicines are outlined above.  Tests Ordered: No orders of the defined types were placed in this encounter.  Medication Changes: No orders of the defined types were placed in this encounter.     I,Mitra Faeizi,acting as a Education administrator for Freada Bergeron, MD.,have documented all relevant documentation on the behalf of Freada Bergeron, MD,as directed by  Freada Bergeron, MD while in the presence of Freada Bergeron, MD.  I, Freada Bergeron, MD, have reviewed all documentation for this visit. The documentation on 05/25/22 for the exam, diagnosis, procedures, and orders are all accurate and complete.

## 2022-05-25 NOTE — Patient Instructions (Signed)
Medication Instructions:   Your physician recommends that you continue on your current medications as directed. Please refer to the Current Medication list given to you today.  *If you need a refill on your cardiac medications before your next appointment, please call your pharmacy*    Follow-Up:  3 WEEKS WITH DR. Johney Frame HERE AT [address]--SCHEDULE ON 06/13/22 AT 10 AM END SLOT--OK PER DR. Johney Frame  Important Information About Sugar

## 2022-05-29 ENCOUNTER — Encounter: Payer: Self-pay | Admitting: Cardiology

## 2022-06-06 NOTE — Progress Notes (Unsigned)
Cardio-Obstetrics Clinic  Follow Up Note   Date:  06/13/2022   ID:  [name], DOB 1986-06-04, MRN [id number]  PCP:  Kathyrn Lass   [place] Providers Cardiologist:  Freada Bergeron, MD  Electrophysiologist:  None       Referring MD: No ref. provider found   Chief Complaint: Moderate to severe MR  History of Present Illness:    [name] is a 36 y.o. female [[id number]] who returns for follow up of moderate to severe MR.  Patient was hospitalized in 04/2022 for pyelonephritis.  On admission, she was noted to by tachycardic and ECG was performed which revealed sinus tachycardia with borderline LVH and inferolateral TWI. Cardiology was called overnight and TTE was recommended which revealed  LVEF 40% with apical lateral hypokinesis, normal RV, mildly thickened and calcified mitral valve leaflet (abnormal for age) but no definitive post-inflammatory changes with moderate to severe MR. Trops negative x2. BNP 109. She appeared euvolemic on exam with no chest pain or SOB. We started metoprolol 12.'5mg'$  XL daily.  Notably, had pre-eclampsia with prior pregnancies at time of delivery. Resolved with delivery and did not require BP meds post-pregnancy.  Was seen in clinic on 05/07/22 where she was doing well from a CV standpoint. No HF symptoms. TTE 05/22/22 stable with LVEF 40-45%, normal RV, moderate to severe MR.   Was last seen 05/25/22 where she was doing well from a CV standpoint.  Today, the patient feels well. No chest pain, SOB, orthopnea, LE edema or PND. Had recent viral URI which she is recovering from. Tolerating her metoprolol with blood pressure well controlled. Compliant with aspirin.   Prior CV Studies Reviewed: The following studies were reviewed today:  TTE 05/22/22: IMPRESSIONS     1. Left ventricular ejection fraction, by estimation, is 40 to 45%. Left  ventricular ejection fraction by 3D volume is 45 %. The left ventricle has  mildly  decreased function. The left ventricle demonstrates global  hypokinesis. The left ventricular  internal cavity size was mildly dilated. Left ventricular diastolic  parameters were normal.   2. Right ventricular systolic function is normal. The right ventricular  size is normal. Tricuspid regurgitation signal is inadequate for assessing  PA pressure.   3. The mitral valve is normal in structure. Moderate to severe mitral  valve regurgitation.   4. The aortic valve is tricuspid. Aortic valve regurgitation is not  visualized. No aortic stenosis is present.   5. The inferior vena cava is normal in size with greater than 50%  respiratory variability, suggesting right atrial pressure of 3 mmHg.   Comparison(s): No significant change from prior study. Prior images  reviewed side by side.   Conclusion(s)/Recommendation(s): Consider TEE to clarify mechanism and  severity of mitral insufficiency.    TTE 04/22/22: IMPRESSIONS     1. Left ventricular ejection fraction, by estimation, is 40%. Left  ventricular ejection fraction by 3D volume is 40 %. The left ventricle has  mild to moderately decreased function. The left ventricle demonstrates  global hypokinesis and regional wall  motion abnormalities (see scoring diagram/findings for description). The  left ventricular internal cavity size was mildly dilated. Left ventricular  diastolic parameters were normal.   2. Right ventricular systolic function is normal. The right ventricular  size is normal. Tricuspid regurgitation signal is inadequate for assessing  PA pressure.   3. The mitral valve is grossly normal. Moderate to severe mitral valve  regurgitation. The mitral valve is mildly thickened for age,  but no  definite post inflammatory change unless clinical relevance. Suspect  mechanism is secondary MR due to LV  dysfunction. No evidence of mitral stenosis.   4. The aortic valve was not well visualized. Aortic valve regurgitation  is  not visualized. No aortic stenosis is present.   5. The inferior vena cava is normal in size with greater than 50%  respiratory variability, suggesting right atrial pressure of 3 mmHg.     Past Medical History:  Diagnosis Date   Bacterial infection    History of chicken pox    Systolic heart failure [facility])     Past Surgical History:  Procedure Laterality Date   CESAREAN SECTION     CESAREAN SECTION N/A 02/02/2015   Procedure: CESAREAN SECTION;  Surgeon: Thurnell Lose, MD;  Location: [place] [facility];  Service: Obstetrics;  Laterality: N/A;   DILATION AND CURETTAGE OF UTERUS     WISDOM TOOTH EXTRACTION        OB History     Gravida  5   Para  2   Term  1   Preterm  1   AB  2   Living  2      SAB  1   IAB  1   Ectopic      Multiple  0   Live Births  2               Current Medications: Current Meds  Medication Sig   aspirin (ASPIRIN CHILDRENS) 81 MG chewable tablet Chew 1 tablet (81 mg total) by mouth daily.   metoprolol succinate (TOPROL-XL) 25 MG 24 hr tablet Take 0.5 tablets (12.5 mg total) by mouth at bedtime.   Prenatal MV & Min w/FA-DHA ([employer] PRENATAL GUMMY) 0.18-25 MG CHEW Chew 1 tablet by mouth daily.     Allergies:   Penicillins   Social History   Socioeconomic History   Marital status: Single    Spouse name: Not on file   Number of children: Not on file   Years of education: Not on file   Highest education level: Not on file  Occupational History   Not on file  Tobacco Use   Smoking status: Never   Smokeless tobacco: Never  Vaping Use   Vaping Use: Not on file  Substance and Sexual Activity   Alcohol use: No   Drug use: No   Sexual activity: Yes    Birth control/protection: None  Other Topics Concern   Not on file  Social History Narrative   Not on file   Social Determinants of Health   Financial Resource Strain: Not on file  Food Insecurity: No Food Insecurity (04/22/2022)   Hunger Vital Sign    Worried About Running Out of Food  in the Last Year: Never true    Ran Out of Food in the Last Year: Never true  Transportation Needs: No Transportation Needs (04/22/2022)   PRAPARE - Hydrologist (Medical): No    Lack of Transportation (Non-Medical): No  Physical Activity: Not on file  Stress: Not on file  Social Connections: Not on file      Family History  Problem Relation Age of Onset   Hypertension Mother    Hypertension Father    Diabetes Father    Hypertension Paternal Aunt    Asthma Neg Hx    Cancer Neg Hx    Heart disease Neg Hx    Stroke Neg Hx  ROS:   Please see the history of present illness.    Review of Systems  Constitutional:  Negative for chills and fever.  HENT:  Positive for congestion. Negative for nosebleeds and tinnitus.   Eyes:  Negative for blurred vision.  Respiratory:  Negative for shortness of breath (stable, unchanged from baseline).   Cardiovascular:  Negative for chest pain, palpitations, orthopnea, claudication, leg swelling and PND.  Gastrointestinal:  Negative for blood in stool, melena, nausea and vomiting.  Genitourinary:  Negative for dysuria and hematuria.  Musculoskeletal:  Negative for falls.  Neurological:  Negative for dizziness, loss of consciousness and headaches.    All other systems reviewed and are negative.   Labs/EKG Reviewed:    EKG:  EKG is personally reviewed. 05/25/22: EKG was not ordered. 04/22/22: Sinus tachycardia, possible Left atrial enlargement, T wave abnormality, consider lateral ischemia, abnormal ECG, 110 bpm   Recent Labs: 04/22/2022: ALT 51; B Natriuretic Peptide 109.9; BUN 5; Creatinine, Ser 0.61; Hemoglobin 9.9; Platelets 297; Potassium 3.8; Sodium 134   Recent Lipid Panel No results found for: "CHOL", "TRIG", "HDL", "CHOLHDL", "LDLCALC", "LDLDIRECT"  Physical Exam:    VS:  BP 110/70   Pulse 90   Ht '5\' 4"'$  (1.626 m)   Wt 137 lb 9.6 oz (62.4 kg)   LMP 01/02/2022   SpO2 98%   BMI 23.62 kg/m      Wt Readings from Last 3 Encounters:  06/13/22 137 lb 9.6 oz (62.4 kg)  05/25/22 132 lb (59.9 kg)  05/22/22 132 lb (59.9 kg)     GEN:  Well nourished, well developed in no acute distress, comfortable HEENT: Normal NECK: No JVD; No carotid bruits CARDIAC: RRR, 2/6 systolic murmur heard throughout the precordium. No rubs or gallops RESPIRATORY:  Clear to auscultation without rales, wheezing or rhonchi  ABDOMEN: Gravid, nontender to palpation MUSCULOSKELETAL:  No edema; No deformity  SKIN: Warm and dry NEUROLOGIC:  Alert and oriented x 3 PSYCHIATRIC:  Normal affect    Risk Assessment/Risk Calculators:              ASSESSMENT & PLAN:    #Systolic HF with mildly reduced EF: Patient admitted in 04/2022 for pyelonephritis with ECG revealing sinus tachycardia, possible LVH and inferior TWI. This abnormal ECG prompted TTE which revealed LVEF 40% with apical lateral hypokinesis, moderate-to-severe MR with mildly thickened valve for age. Unknown etiology as patient denies any personal or family history of CV disease, no known serious illnesses as a child or adult, and no history of hospitalization for volume overload/chest pain. States she had pre-eclampsia during first pregnancy at time of delivery but HTN resolved post-partum and she never required lasix. She reports chronic dyspnea on exertion that is worse with current pregnancy but no significant chest pain, palpitations, LE edema or orthopnea. In the hospital, trop negative x2 and BNP 109 with no evidence of volume overload. Will plan to manage medically throughout pregnancy with close monitoring of echoes and regular follow-up. Once she is no longer pregnancy, will proceed with more in-depth work-up including TEE/CMR and coronary CTA. -Discussed she is WHO class III with pregnancy, CARPREG score 5 corresponding to 40% chance of CV complications during pregnancy -Currently euvolemic and compensated -Continue metop 12.'5mg'$  XL daily; once  no longer pregnant, can optimize meds further -Continue serial monitoring of TTE; can move to every 6 weeks if stable on repeat TTE -Once she is no longer pregnant, we can pursue further work-up with CMR/TEE for her valve and likely coronary  CTA to assess coronary anatomy   #Moderate-to-severe MR: MV appears more thickened than would be expected for age but does not appear post-inflammatory. Denies any history of rheumatic fever, IVDU, blood stream infections, severe childhood illness or family history of disease. Likely this has been chronic and ongoing and patient is currently compensated and euvolemic on exam. Will manage HF as above. High risk of volume overload in pregnancy and will need to monitor her fluid status closely. She will be monitored with serial echoes.  Once no longer pregnant, will pursue further work-up.  -Continue metop 12.'5mg'$  XL daily -Continue serial monitoring with echoes; can move out to every 6 weeks if stable on repeat -Will need to watch volume status closely with pregnancy -Will pursue further work-up once patient is no longer pregnant as detailed above  #History of Pre-Eclampsia: #High Risk Pregnancy: -Continue ASA '81mg'$  daily -BP controlled -Management per OBGYN -Followed by Cardio OB team   Patient Instructions  Medication Instructions:  Your physician recommends that you continue on your current medications as directed. Please refer to the Current Medication list given to you today.  *If you need a refill on your cardiac medications before your next appointment, please call your pharmacy*  Lab Work: If you have labs (blood work) drawn today and your tests are completely normal, you will receive your results only by: [place] (if you have MyChart) OR A paper copy in the mail If you have any lab test that is abnormal or we need to change your treatment, we will call you to review the results.  Testing/Procedures: None ordered  today.  Follow-Up: At [facility], you and your health needs are our priority.  As part of our continuing mission to provide you with exceptional heart care, we have created designated Provider Care Teams.  These Care Teams include your primary Cardiologist (physician) and Advanced Practice Providers (APPs -  Physician Assistants and Nurse Practitioners) who all work together to provide you with the care you need, when you need it.  We recommend signing up for the patient portal called "MyChart".  Sign up information is provided on this After Visit Summary.  MyChart is used to connect with patients for Virtual Visits (Telemedicine).  Patients are able to view lab/test results, encounter notes, upcoming appointments, etc.  Non-urgent messages can be sent to your provider as well.   To learn more about what you can do with MyChart, go to [email or web address].    Your next appointment:   3 week(s)  The format for your next appointment:   In Person  Provider:   Freada Bergeron, MD      Important Information About Sugar          Follow-up: 3 weeks  Medication Adjustments/Labs and Tests Ordered: Current medicines are reviewed at length with the patient today.  Concerns regarding medicines are outlined above.  Tests Ordered: No orders of the defined types were placed in this encounter.  Medication Changes: No orders of the defined types were placed in this encounter.    Gwyndolyn Kaufman, MD

## 2022-06-13 ENCOUNTER — Ambulatory Visit: Payer: 59 | Attending: Cardiology | Admitting: Cardiology

## 2022-06-13 ENCOUNTER — Encounter: Payer: Self-pay | Admitting: Cardiology

## 2022-06-13 VITALS — BP 110/70 | HR 90 | Ht 64.0 in | Wt 137.6 lb

## 2022-06-13 DIAGNOSIS — O99412 Diseases of the circulatory system complicating pregnancy, second trimester: Secondary | ICD-10-CM | POA: Diagnosis not present

## 2022-06-13 DIAGNOSIS — O0992 Supervision of high risk pregnancy, unspecified, second trimester: Secondary | ICD-10-CM

## 2022-06-13 DIAGNOSIS — Z8759 Personal history of other complications of pregnancy, childbirth and the puerperium: Secondary | ICD-10-CM

## 2022-06-13 DIAGNOSIS — I5022 Chronic systolic (congestive) heart failure: Secondary | ICD-10-CM

## 2022-06-13 DIAGNOSIS — Z3A23 23 weeks gestation of pregnancy: Secondary | ICD-10-CM

## 2022-06-13 DIAGNOSIS — I340 Nonrheumatic mitral (valve) insufficiency: Secondary | ICD-10-CM

## 2022-06-13 NOTE — Patient Instructions (Signed)
Medication Instructions:  Your physician recommends that you continue on your current medications as directed. Please refer to the Current Medication list given to you today.  *If you need a refill on your cardiac medications before your next appointment, please call your pharmacy*  Lab Work: If you have labs (blood work) drawn today and your tests are completely normal, you will receive your results only by: [place] (if you have MyChart) OR A paper copy in the mail If you have any lab test that is abnormal or we need to change your treatment, we will call you to review the results.  Testing/Procedures: None ordered today.  Follow-Up: At [facility], you and your health needs are our priority.  As part of our continuing mission to provide you with exceptional heart care, we have created designated Provider Care Teams.  These Care Teams include your primary Cardiologist (physician) and Advanced Practice Providers (APPs -  Physician Assistants and Nurse Practitioners) who all work together to provide you with the care you need, when you need it.  We recommend signing up for the patient portal called "MyChart".  Sign up information is provided on this After Visit Summary.  MyChart is used to connect with patients for Virtual Visits (Telemedicine).  Patients are able to view lab/test results, encounter notes, upcoming appointments, etc.  Non-urgent messages can be sent to your provider as well.   To learn more about what you can do with MyChart, go to [email or web address].    Your next appointment:   3 week(s)  The format for your next appointment:   In Person  Provider:   Freada Bergeron, MD      Important Information About Sugar

## 2022-06-20 DIAGNOSIS — Z7189 Other specified counseling: Secondary | ICD-10-CM | POA: Insufficient documentation

## 2022-06-20 NOTE — Progress Notes (Unsigned)
PRENATAL VISIT NOTE  Subjective:  [name] is a 37 y.o. [id number] at 32w2dbeing seen today for ongoing prenatal care.  She is currently monitored for the following issues for this high-risk pregnancy and has Supervision of high risk pregnancy, antepartum; History of gestational hypertension; Multigravida of advanced maternal age in first trimester; History of preterm delivery, currently pregnant; UTI in pregnancy; Abnormal Pap smear of cervix; Pyelonephritis affecting pregnancy in second trimester; Systolic heart failure ([place]; Maternal mitral valve regurgitation affecting pregnancy in second trimester, antepartum; and Previous cesarean delivery affecting pregnancy on their problem list.  Patient reports {[id number]}.   .  .   . Denies leaking of fluid.   The following portions of the patient's history were reviewed and updated as appropriate: allergies, current medications, past family history, past medical history, past social history, past surgical history and problem list.   Objective:  There were no vitals filed for this visit.  Fetal Status:           General:  Alert, oriented and cooperative. Patient is in no acute distress.  Skin: Skin is warm and dry. No rash noted.   Cardiovascular: Normal heart rate noted  Respiratory: Normal respiratory effort, no problems with respiration noted  Abdomen: Soft, gravid, appropriate for gestational age.        Pelvic: Cervical exam deferred        Extremities: Normal range of motion.     Mental Status: Normal mood and affect. Normal behavior. Normal judgment and thought content.   Assessment and Plan:  Pregnancy: [id number] [redacted]w[redacted]d. Systolic heart failure, unspecified HF chronicity (HCC) LVEF was stable on last Echo on 05/22/22 still with mod-severe MR.  Continue toprol per Dr. PeJohney Framend continue follow up with them.  Plan is for serial TEE throughout her pregnancy.   2. Maternal mitral valve regurgitation affecting pregnancy in  second trimester, antepartum Mod-Severe - no identifiable cause per review of cards note. Suspect chronic.   3. Pyelonephritis affecting pregnancy in second trimester S/p therapy in patient.   4. Supervision of high risk pregnancy, antepartum Routine PNC up to date.  28w labs and Tdap next visit.  Discussed MOC: ***  5. History of gestational hypertension Continue ldASA  6. Multigravida of advanced maternal age in first trimester LR NIPS  7. History of preterm delivery, currently pregnant Ideally 39w but may be indicated sooner by cardiac status. Currently asymptomatic.   8. Low grade squamous intraepithelial lesion on cytologic smear of cervix (LGSIL) LSIL/HPV pos - will do colposcopy postpartum now that she is 24 weeks   9. Previous cesarean delivery affecting pregnancy Plan is for repeat c-section. Consult anesthesia in third tri for anesthesia plan.   Preterm labor symptoms and general obstetric precautions including but not limited to vaginal bleeding, contractions, leaking of fluid and fetal movement were reviewed in detail with the patient. Please refer to After Visit Summary for other counseling recommendations.   No follow-ups on file.  Future Appointments  Date Time Provider DeDowning1/09/2022  9:30 AM DuRadene GunningMD [facility]09/2022 11:15 AM [facility] NURSE [facility]09[facility]2022 11:30 AM [facility]/09/2022  2:05 PM [facility]-CV [facility] ECHO 5 [facility]  07/05/2022  4:30 PM PeFreada BergeronMD CVD-CHUSTOFF [facility]  07/20/2022  9:15 AM [facility] NURSE [facility]  9:30 AM [facility]20[facility]  9:15 AM [facility] NURSE [facility]  9:30 AM [facility]-M[facility]  US3 [facility]MFCUS [facility]    Radene Gunning, MD

## 2022-06-21 ENCOUNTER — Encounter: Payer: Self-pay | Admitting: Obstetrics and Gynecology

## 2022-06-21 ENCOUNTER — Ambulatory Visit: Payer: Medicaid Other | Attending: Maternal & Fetal Medicine

## 2022-06-21 ENCOUNTER — Other Ambulatory Visit: Payer: Self-pay

## 2022-06-21 ENCOUNTER — Ambulatory Visit (HOSPITAL_BASED_OUTPATIENT_CLINIC_OR_DEPARTMENT_OTHER): Payer: Medicaid Other

## 2022-06-21 ENCOUNTER — Ambulatory Visit (INDEPENDENT_AMBULATORY_CARE_PROVIDER_SITE_OTHER): Payer: Medicaid Other | Admitting: Obstetrics and Gynecology

## 2022-06-21 ENCOUNTER — Ambulatory Visit: Payer: Medicaid Other | Admitting: *Deleted

## 2022-06-21 VITALS — BP 123/78 | HR 90 | Wt 136.0 lb

## 2022-06-21 VITALS — BP 123/78 | HR 86

## 2022-06-21 DIAGNOSIS — Z8759 Personal history of other complications of pregnancy, childbirth and the puerperium: Secondary | ICD-10-CM

## 2022-06-21 DIAGNOSIS — Z3A24 24 weeks gestation of pregnancy: Secondary | ICD-10-CM

## 2022-06-21 DIAGNOSIS — O09522 Supervision of elderly multigravida, second trimester: Secondary | ICD-10-CM

## 2022-06-21 DIAGNOSIS — O34219 Maternal care for unspecified type scar from previous cesarean delivery: Secondary | ICD-10-CM

## 2022-06-21 DIAGNOSIS — Z362 Encounter for other antenatal screening follow-up: Secondary | ICD-10-CM

## 2022-06-21 DIAGNOSIS — O358XX0 Maternal care for other (suspected) fetal abnormality and damage, not applicable or unspecified: Secondary | ICD-10-CM

## 2022-06-21 DIAGNOSIS — I340 Nonrheumatic mitral (valve) insufficiency: Secondary | ICD-10-CM | POA: Insufficient documentation

## 2022-06-21 DIAGNOSIS — O99891 Other specified diseases and conditions complicating pregnancy: Secondary | ICD-10-CM

## 2022-06-21 DIAGNOSIS — R87612 Low grade squamous intraepithelial lesion on cytologic smear of cervix (LGSIL): Secondary | ICD-10-CM

## 2022-06-21 DIAGNOSIS — O09212 Supervision of pregnancy with history of pre-term labor, second trimester: Secondary | ICD-10-CM

## 2022-06-21 DIAGNOSIS — I5020 Unspecified systolic (congestive) heart failure: Secondary | ICD-10-CM

## 2022-06-21 DIAGNOSIS — O09521 Supervision of elderly multigravida, first trimester: Secondary | ICD-10-CM

## 2022-06-21 DIAGNOSIS — O09892 Supervision of other high risk pregnancies, second trimester: Secondary | ICD-10-CM

## 2022-06-21 DIAGNOSIS — Z148 Genetic carrier of other disease: Secondary | ICD-10-CM

## 2022-06-21 DIAGNOSIS — O99412 Diseases of the circulatory system complicating pregnancy, second trimester: Secondary | ICD-10-CM

## 2022-06-21 DIAGNOSIS — O09529 Supervision of elderly multigravida, unspecified trimester: Secondary | ICD-10-CM | POA: Diagnosis not present

## 2022-06-21 DIAGNOSIS — O0992 Supervision of high risk pregnancy, unspecified, second trimester: Secondary | ICD-10-CM

## 2022-06-21 DIAGNOSIS — O09899 Supervision of other high risk pregnancies, unspecified trimester: Secondary | ICD-10-CM

## 2022-06-21 DIAGNOSIS — Z7189 Other specified counseling: Secondary | ICD-10-CM

## 2022-06-21 DIAGNOSIS — O2302 Infections of kidney in pregnancy, second trimester: Secondary | ICD-10-CM

## 2022-06-21 DIAGNOSIS — O0990 Supervision of high risk pregnancy, unspecified, unspecified trimester: Secondary | ICD-10-CM

## 2022-06-21 LAB — ECHOCARDIOGRAM COMPLETE
AR max vel: 1.35 cm2
AV Area VTI: 1.42 cm2
AV Area mean vel: 1.32 cm2
AV Mean grad: 6 mmHg
AV Peak grad: 11.1 mmHg
Ao pk vel: 1.67 m/s
Area-P 1/2: 4.35 cm2
MV M vel: 5.01 m/s
MV Peak grad: 100.4 mmHg
S' Lateral: 4.2 cm
Weight: 2176 oz

## 2022-06-22 ENCOUNTER — Telehealth: Payer: Self-pay | Admitting: *Deleted

## 2022-06-22 ENCOUNTER — Other Ambulatory Visit (HOSPITAL_COMMUNITY): Payer: 59

## 2022-06-22 DIAGNOSIS — O0992 Supervision of high risk pregnancy, unspecified, second trimester: Secondary | ICD-10-CM

## 2022-06-22 DIAGNOSIS — I340 Nonrheumatic mitral (valve) insufficiency: Secondary | ICD-10-CM

## 2022-06-22 DIAGNOSIS — I5020 Unspecified systolic (congestive) heart failure: Secondary | ICD-10-CM

## 2022-06-22 NOTE — Telephone Encounter (Signed)
The patient has been notified of the result and verbalized understanding.  All questions (if any) were answered.  Pt aware that per Dr. Johney Frame, she will need another OB ECHO done in 6 weeks.   Pt aware that I will place the order for this in the system and send a message to our Echo Scheduler to call her back to arrange this appt for that time frame.   Pt verbalized understanding and agrees with this plan.

## 2022-06-22 NOTE — Telephone Encounter (Signed)
-----   Message from Freada Bergeron, MD sent at 06/21/2022  7:10 PM EST ----- Her echo looks stable from prior with moderate to severe MR and mildly reduced pumping function. We can space out her repeat echo to 6 weeks.

## 2022-06-24 LAB — CULTURE, OB URINE

## 2022-06-24 LAB — URINE CULTURE, OB REFLEX

## 2022-06-25 MED ORDER — NITROFURANTOIN MONOHYD MACRO 100 MG PO CAPS: 100.0000 mg | ORAL_CAPSULE | Freq: Two times a day (BID) | ORAL | 0 refills | Status: DC

## 2022-06-25 MED ORDER — NITROFURANTOIN MONOHYD MACRO 100 MG PO CAPS: 100.0000 mg | ORAL_CAPSULE | Freq: Every day | ORAL | 3 refills | Status: DC

## 2022-06-25 NOTE — Addendum Note (Signed)
Addended by: Radene Gunning A on: 06/25/2022 09:38 AM   Modules accepted: Orders

## 2022-06-26 ENCOUNTER — Telehealth: Payer: Self-pay

## 2022-06-26 NOTE — Telephone Encounter (Addendum)
Spoke with pt and she is aware that urine culture is positive for E. Coli. Pt was told there was two prescriptions sent to pharmacy. One for her to take now and then the other prescription for suppression. Pt expressed understanding.   ----- Message from Radene Gunning, MD sent at 06/25/2022  9:38 AM EST ----- Please make sure she takes her medicine - I think in the past she has not seen my chart messages.

## 2022-07-04 NOTE — Progress Notes (Signed)
Cardio-Obstetrics Clinic  Follow Up Note   Date:  07/05/2022   ID:  [name], DOB [birth year], MRN [id number]  PCP:  Patient, No Pcp Per   [place] [facility] Cardiologist:  Freada Bergeron, MD  Electrophysiologist:  None       Referring MD: No ref. provider found   Chief Complaint: Moderate to severe MR  History of Present Illness:    [name] is a 37 y.o. female [[id number]] who returns for follow up of moderate to severe MR.  Patient was hospitalized in 04/2022 for pyelonephritis.  On admission, she was noted to by tachycardic and ECG was performed which revealed sinus tachycardia with borderline LVH and inferolateral TWI. Cardiology was called overnight and TTE was recommended which revealed  LVEF 40% with apical lateral hypokinesis, normal RV, mildly thickened and calcified mitral valve leaflet (abnormal for age) but no definitive post-inflammatory changes with moderate to severe MR. Trops negative x2. BNP 109. She appeared euvolemic on exam with no chest pain or SOB. We started metoprolol 12.'5mg'$  XL daily.  Notably, had pre-eclampsia with prior pregnancies at time of delivery. Resolved with delivery and did not require BP meds post-pregnancy.  Was seen in clinic on 05/07/22 where she was doing well from a CV standpoint. No HF symptoms. TTE 05/22/22 stable with LVEF 40-45%, normal RV, moderate to severe MR.   Was last seen 06/13/22 where she was doing well from a CV standpoint.  Today, the patient is currently 76w2dpregnant. Doing well. No chest pain. Has been having some episodes of lightheadedness and fatigue. Last week, she has had several episodes where she has been dizzy and she had to sit down. Notably, her blood pressure is 90/60s in clinic.   Otherwise, no LE edema, orthopnea or PND.    Prior CV Studies Reviewed: The following studies were reviewed today:  TTE 05/22/22: IMPRESSIONS     1. Left ventricular ejection fraction, by  estimation, is 40 to 45%. Left  ventricular ejection fraction by 3D volume is 45 %. The left ventricle has  mildly decreased function. The left ventricle demonstrates global  hypokinesis. The left ventricular  internal cavity size was mildly dilated. Left ventricular diastolic  parameters were normal.   2. Right ventricular systolic function is normal. The right ventricular  size is normal. Tricuspid regurgitation signal is inadequate for assessing  PA pressure.   3. The mitral valve is normal in structure. Moderate to severe mitral  valve regurgitation.   4. The aortic valve is tricuspid. Aortic valve regurgitation is not  visualized. No aortic stenosis is present.   5. The inferior vena cava is normal in size with greater than 50%  respiratory variability, suggesting right atrial pressure of 3 mmHg.   Comparison(s): No significant change from prior study. Prior images  reviewed side by side.   Conclusion(s)/Recommendation(s): Consider TEE to clarify mechanism and  severity of mitral insufficiency.    TTE 04/22/22: IMPRESSIONS     1. Left ventricular ejection fraction, by estimation, is 40%. Left  ventricular ejection fraction by 3D volume is 40 %. The left ventricle has  mild to moderately decreased function. The left ventricle demonstrates  global hypokinesis and regional wall  motion abnormalities (see scoring diagram/findings for description). The  left ventricular internal cavity size was mildly dilated. Left ventricular  diastolic parameters were normal.   2. Right ventricular systolic function is normal. The right ventricular  size is normal. Tricuspid regurgitation signal is inadequate for assessing  PA  pressure.   3. The mitral valve is grossly normal. Moderate to severe mitral valve  regurgitation. The mitral valve is mildly thickened for age, but no  definite post inflammatory change unless clinical relevance. Suspect  mechanism is secondary MR due to LV   dysfunction. No evidence of mitral stenosis.   4. The aortic valve was not well visualized. Aortic valve regurgitation  is not visualized. No aortic stenosis is present.   5. The inferior vena cava is normal in size with greater than 50%  respiratory variability, suggesting right atrial pressure of 3 mmHg.     Past Medical History:  Diagnosis Date   Bacterial infection    History of chicken pox    Systolic heart failure [facility])     Past Surgical History:  Procedure Laterality Date   CESAREAN SECTION     CESAREAN SECTION N/A 02/02/2015   Procedure: CESAREAN SECTION;  Surgeon: Thurnell Lose, MD;  Location: [facility];  Service: Obstetrics;  Laterality: N/A;   DILATION AND CURETTAGE OF UTERUS     WISDOM TOOTH EXTRACTION        OB History     Gravida  5   Para  2   Term  1   Preterm  1   AB  2   Living  2      SAB  1   IAB  1   Ectopic      Multiple  0   Live Births  2               Current Medications: Current Meds  Medication Sig   aspirin (ASPIRIN CHILDRENS) 81 MG chewable tablet Chew 1 tablet (81 mg total) by mouth daily.   nitrofurantoin, macrocrystal-monohydrate, (MACROBID) 100 MG capsule Take 1 capsule (100 mg total) by mouth 2 (two) times daily.   Prenatal MV & Min w/FA-DHA ([employer] PRENATAL GUMMY) 0.18-25 MG CHEW Chew 1 tablet by mouth daily.   [DISCONTINUED] metoprolol succinate (TOPROL-XL) 25 MG 24 hr tablet Take 0.5 tablets (12.5 mg total) by mouth at bedtime.     Allergies:   Penicillins   Social History   Socioeconomic History   Marital status: Single    Spouse name: Not on file   Number of children: Not on file   Years of education: Not on file   Highest education level: Not on file  Occupational History   Not on file  Tobacco Use   Smoking status: Never   Smokeless tobacco: Never  Vaping Use   Vaping Use: Not on file  Substance and Sexual Activity   Alcohol use: No   Drug use: No   Sexual activity: Yes    Birth  control/protection: None  Other Topics Concern   Not on file  Social History Narrative   Not on file   Social Determinants of Health   Financial Resource Strain: Not on file  Food Insecurity: No Food Insecurity (04/22/2022)   Hunger Vital Sign    Worried About Running Out of Food in the Last Year: Never true    Ran Out of Food in the Last Year: Never true  Transportation Needs: No Transportation Needs (04/22/2022)   PRAPARE - Hydrologist (Medical): No    Lack of Transportation (Non-Medical): No  Physical Activity: Not on file  Stress: Not on file  Social Connections: Not on file      Family History  Problem Relation Age of Onset   Hypertension Mother  Hypertension Father    Diabetes Father    Hypertension Paternal Aunt    Asthma Neg Hx    Cancer Neg Hx    Heart disease Neg Hx    Stroke Neg Hx       ROS:   Please see the history of present illness.    Review of Systems  Constitutional:  Negative for chills and fever.  HENT:  Positive for congestion. Negative for nosebleeds and tinnitus.   Eyes:  Negative for blurred vision.  Respiratory:  Negative for shortness of breath (stable, unchanged from baseline).   Cardiovascular:  Negative for chest pain, palpitations, orthopnea, claudication, leg swelling and PND.  Gastrointestinal:  Negative for blood in stool, melena, nausea and vomiting.  Genitourinary:  Negative for dysuria and hematuria.  Musculoskeletal:  Negative for falls.  Neurological:  Negative for dizziness, loss of consciousness and headaches.    All other systems reviewed and are negative.   Labs/EKG Reviewed:    EKG:  No new tracing   Recent Labs: 04/22/2022: ALT 51; B Natriuretic Peptide 109.9; BUN 5; Creatinine, Ser 0.61; Hemoglobin 9.9; Platelets 297; Potassium 3.8; Sodium 134   Recent Lipid Panel No results found for: "CHOL", "TRIG", "HDL", "CHOLHDL", "LDLCALC", "LDLDIRECT"  Physical Exam:    VS:  BP 98/69    Pulse 81   Ht '5\' 4"'$  (1.626 m)   Wt 139 lb 9.6 oz (63.3 kg)   LMP 01/02/2022   SpO2 98%   BMI 23.96 kg/m     Wt Readings from Last 3 Encounters:  07/05/22 139 lb 9.6 oz (63.3 kg)  06/21/22 136 lb (61.7 kg)  06/13/22 137 lb 9.6 oz (62.4 kg)     GEN:  Well nourished, well developed in no acute distress, comfortable HEENT: Normal NECK: No JVD; No carotid bruits CARDIAC: RRR, 2/6 systolic murmur heard throughout the precordium. No rubs or gallops RESPIRATORY:  CTAB ABDOMEN: Gravid, nontender to palpation MUSCULOSKELETAL:  No edema, warm SKIN: Warm and dry NEUROLOGIC:  Alert and oriented x 3 PSYCHIATRIC:  Normal affect    Risk Assessment/Risk Calculators:              ASSESSMENT & PLAN:    #Systolic HF with mildly reduced EF: Patient admitted in 04/2022 for pyelonephritis with ECG revealing sinus tachycardia, possible LVH and inferior TWI. This abnormal ECG prompted TTE which revealed LVEF 40% with apical lateral hypokinesis, moderate-to-severe MR with mildly thickened valve for age. Unknown etiology as patient denies any personal or family history of CV disease, no known serious illnesses as a child or adult, and no history of hospitalization for volume overload/chest pain. States she had pre-eclampsia during first pregnancy at time of delivery but HTN resolved post-partum and she never required lasix. She reports chronic dyspnea on exertion that is worse with current pregnancy but no significant chest pain, palpitations, LE edema or orthopnea. In the hospital, trop negative x2 and BNP 109 with no evidence of volume overload. Will plan to manage medically throughout pregnancy with close monitoring of echoes and regular follow-up. Once she is no longer pregnancy, will proceed with more in-depth work-up including TEE/CMR and coronary CTA. -Discussed she is WHO class III with pregnancy, CARPREG score 5 corresponding to 40% chance of CV complications during pregnancy -Currently euvolemic  and compensated -Hold metop due to soft blood pressure and episodes of dizziness -Continue serial monitoring of TTE; will move up next TTE due to episodes of dizziness -Once she is no longer pregnant, we can pursue  further work-up with CMR/TEE for her valve and likely coronary CTA to assess coronary anatomy   #Moderate-to-severe MR: MV appears more thickened than would be expected for age but does not appear post-inflammatory. Denies any history of rheumatic fever, IVDU, blood stream infections, severe childhood illness or family history of disease. Likely this has been chronic and ongoing and patient is currently compensated and euvolemic on exam. Will manage HF as above. High risk of volume overload in pregnancy and will need to monitor her fluid status closely. She will be monitored with serial echoes.  Once no longer pregnant, will pursue further work-up.  -Hold metop due to soft blood pressure and episodes of dizziness -Continue serial monitoring with echoes -Will need to watch volume status closely with pregnancy -Will pursue further work-up once patient is no longer pregnant as detailed above  #Dizziness: Patient with episodes of orthostasis at home with SBP 90s in the office. Common in pregnancy. No evidence of acute HF exacerbation on exam. Will hold the metop for now. Discussed conservative measures such as hydration, compression socks, small frequent meals. Will move up TTE to ensure no interval change. Will see her back in 3 weeks to ensure symptoms are improving.  #History of Pre-Eclampsia: #High Risk Pregnancy: -Continue ASA '81mg'$  daily -BP controlled -Management per OBGYN -Followed by Cardio OB team   Patient Instructions  Medication Instructions:   STOP TAKING METOPROLOL NOW  *If you need a refill on your cardiac medications before your next appointment, please call your pharmacy*    Testing/Procedures:  Your physician has requested that you have an OB echocardiogram.  Echocardiography is a painless test that uses sound waves to create images of your heart. It provides your doctor with information about the size and shape of your heart and how well your heart's chambers and valves are working. This procedure takes approximately one hour. There are no restrictions for this procedure. Cardiac-OB patient: to be performed by Lorriane Shire or Bethany.   - ECHO IS ALREADY SCHEDULED FOR 2/16 BUT PER DR. Johney Frame THIS NEEDS TO BE MOVED UP TO BE DONE IN 2 WEEKS VS ON 2/16    Please do NOT wear cologne, perfume, aftershave, or lotions (deodorant is allowed). Please arrive 15 minutes prior to your appointment time.    Follow-Up:   3 WEEKS WITH DR. Isaiah Blakes SCHEDULE ON 07/18/22 AT ANY OPEN SLOT--OK PER DR. Johney Frame AND IVY     Follow-up: 3 weeks  Medication Adjustments/Labs and Tests Ordered: Current medicines are reviewed at length with the patient today.  Concerns regarding medicines are outlined above.  Tests Ordered: No orders of the defined types were placed in this encounter.  Medication Changes: No orders of the defined types were placed in this encounter.    Gwyndolyn Kaufman, MD

## 2022-07-05 ENCOUNTER — Encounter: Payer: Self-pay | Admitting: Cardiology

## 2022-07-05 ENCOUNTER — Ambulatory Visit: Payer: Medicaid Other | Attending: Cardiology | Admitting: Cardiology

## 2022-07-05 VITALS — BP 98/69 | HR 81 | Ht 64.0 in | Wt 139.6 lb

## 2022-07-05 DIAGNOSIS — I5020 Unspecified systolic (congestive) heart failure: Secondary | ICD-10-CM | POA: Diagnosis not present

## 2022-07-05 DIAGNOSIS — O99412 Diseases of the circulatory system complicating pregnancy, second trimester: Secondary | ICD-10-CM | POA: Diagnosis not present

## 2022-07-05 DIAGNOSIS — I5022 Chronic systolic (congestive) heart failure: Secondary | ICD-10-CM | POA: Diagnosis not present

## 2022-07-05 DIAGNOSIS — I340 Nonrheumatic mitral (valve) insufficiency: Secondary | ICD-10-CM

## 2022-07-05 DIAGNOSIS — Z3A26 26 weeks gestation of pregnancy: Secondary | ICD-10-CM

## 2022-07-05 DIAGNOSIS — O0992 Supervision of high risk pregnancy, unspecified, second trimester: Secondary | ICD-10-CM

## 2022-07-05 DIAGNOSIS — Z8759 Personal history of other complications of pregnancy, childbirth and the puerperium: Secondary | ICD-10-CM

## 2022-07-05 NOTE — Patient Instructions (Signed)
Medication Instructions:   STOP TAKING METOPROLOL NOW  *If you need a refill on your cardiac medications before your next appointment, please call your pharmacy*    Testing/Procedures:  Your physician has requested that you have an OB echocardiogram. Echocardiography is a painless test that uses sound waves to create images of your heart. It provides your doctor with information about the size and shape of your heart and how well your heart's chambers and valves are working. This procedure takes approximately one hour. There are no restrictions for this procedure. Cardiac-OB patient: to be performed by Lorriane Shire or Bethany.   - ECHO IS ALREADY SCHEDULED FOR 2/16 BUT PER DR. Johney Frame THIS NEEDS TO BE MOVED UP TO BE DONE IN 2 WEEKS VS ON 2/16    Please do NOT wear cologne, perfume, aftershave, or lotions (deodorant is allowed). Please arrive 15 minutes prior to your appointment time.    Follow-Up:   3 WEEKS WITH DR. Isaiah Blakes SCHEDULE ON 07/18/22 AT ANY OPEN SLOT--OK PER DR. Johney Frame AND Taliyah Watrous

## 2022-07-11 ENCOUNTER — Telehealth: Payer: Self-pay

## 2022-07-11 ENCOUNTER — Encounter: Payer: Self-pay | Admitting: Cardiology

## 2022-07-11 NOTE — Telephone Encounter (Signed)
-----  Message from Freada Bergeron, MD sent at 07/11/2022  4:31 PM EST ----- Can we send her a 3 day zio for palpitations?  Thank you!

## 2022-07-12 NOTE — Progress Notes (Deleted)
Cardio-Obstetrics Clinic  Follow Up Note   Date:  07/12/2022   ID:  [name], DOB 12/14/1985, MRN [id number]  PCP:  Patient, No Pcp Per   [facility] Cardiologist:  Freada Bergeron, MD  Electrophysiologist:  None       Referring MD: No ref. provider found   Chief Complaint: Moderate to severe MR  History of Present Illness:    [name] is a 37 y.o. female [G5P1122] who returns for follow up of moderate to severe MR.  Patient was hospitalized in 04/2022 for pyelonephritis.  On admission, she was noted to by tachycardic and ECG was performed which revealed sinus tachycardia with borderline LVH and inferolateral TWI. Cardiology was called overnight and TTE was recommended which revealed  LVEF 40% with apical lateral hypokinesis, normal RV, mildly thickened and calcified mitral valve leaflet (abnormal for age) but no definitive post-inflammatory changes with moderate to severe MR. Trops negative x2. BNP 109. She appeared euvolemic on exam with no chest pain or SOB. We started metoprolol 12.'5mg'$  XL daily.  Notably, had pre-eclampsia with prior pregnancies at time of delivery. Resolved with delivery and did not require BP meds post-pregnancy.  Was seen in clinic on 05/07/22 where she was doing well from a CV standpoint. No HF symptoms. TTE 05/22/22 stable with LVEF 40-45%, normal RV, moderate to severe MR.   Was last seen 07/05/22 where she was complaining of orthostatic symptoms. BP 90/60s at that time. We held her metop.  Today, ***  Prior CV Studies Reviewed: The following studies were reviewed today:  TTE 05/22/22: IMPRESSIONS     1. Left ventricular ejection fraction, by estimation, is 40 to 45%. Left  ventricular ejection fraction by 3D volume is 45 %. The left ventricle has  mildly decreased function. The left ventricle demonstrates global  hypokinesis. The left ventricular  internal cavity size was mildly dilated. Left ventricular  diastolic  parameters were normal.   2. Right ventricular systolic function is normal. The right ventricular  size is normal. Tricuspid regurgitation signal is inadequate for assessing  PA pressure.   3. The mitral valve is normal in structure. Moderate to severe mitral  valve regurgitation.   4. The aortic valve is tricuspid. Aortic valve regurgitation is not  visualized. No aortic stenosis is present.   5. The inferior vena cava is normal in size with greater than 50%  respiratory variability, suggesting right atrial pressure of 3 mmHg.   Comparison(s): No significant change from prior study. Prior images  reviewed side by side.   Conclusion(s)/Recommendation(s): Consider TEE to clarify mechanism and  severity of mitral insufficiency.    TTE 04/22/22: IMPRESSIONS     1. Left ventricular ejection fraction, by estimation, is 40%. Left  ventricular ejection fraction by 3D volume is 40 %. The left ventricle has  mild to moderately decreased function. The left ventricle demonstrates  global hypokinesis and regional wall  motion abnormalities (see scoring diagram/findings for description). The  left ventricular internal cavity size was mildly dilated. Left ventricular  diastolic parameters were normal.   2. Right ventricular systolic function is normal. The right ventricular  size is normal. Tricuspid regurgitation signal is inadequate for assessing  PA pressure.   3. The mitral valve is grossly normal. Moderate to severe mitral valve  regurgitation. The mitral valve is mildly thickened for age, but no  definite post inflammatory change unless clinical relevance. Suspect  mechanism is secondary MR due to LV  dysfunction. No evidence of  mitral stenosis.   4. The aortic valve was not well visualized. Aortic valve regurgitation  is not visualized. No aortic stenosis is present.   5. The inferior vena cava is normal in size with greater than 50%  respiratory variability, suggesting  right atrial pressure of 3 mmHg.     Past Medical History:  Diagnosis Date   Bacterial infection    History of chicken pox    Systolic heart failure [facility])     Past Surgical History:  Procedure Laterality Date   CESAREAN SECTION     CESAREAN SECTION N/A 02/02/2015   Procedure: CESAREAN SECTION;  Surgeon: Thurnell Lose, MD;  Location: [facility];  Service: Obstetrics;  Laterality: N/A;   DILATION AND CURETTAGE OF UTERUS     WISDOM TOOTH EXTRACTION        OB History     Gravida  5   Para  2   Term  1   Preterm  1   AB  2   Living  2      SAB  1   IAB  1   Ectopic      Multiple  0   Live Births  2               Current Medications: No outpatient medications have been marked as taking for the 07/18/22 encounter (Appointment) with Freada Bergeron, MD.     Allergies:   Penicillins   Social History   Socioeconomic History   Marital status: Single    Spouse name: Not on file   Number of children: Not on file   Years of education: Not on file   Highest education level: Not on file  Occupational History   Not on file  Tobacco Use   Smoking status: Never   Smokeless tobacco: Never  Vaping Use   Vaping Use: Not on file  Substance and Sexual Activity   Alcohol use: No   Drug use: No   Sexual activity: Yes    Birth control/protection: None  Other Topics Concern   Not on file  Social History Narrative   Not on file   Social Determinants of Health   Financial Resource Strain: Not on file  Food Insecurity: No Food Insecurity (04/22/2022)   Hunger Vital Sign    Worried About Running Out of Food in the Last Year: Never true    Ran Out of Food in the Last Year: Never true  Transportation Needs: No Transportation Needs (04/22/2022)   PRAPARE - Hydrologist (Medical): No    Lack of Transportation (Non-Medical): No  Physical Activity: Not on file  Stress: Not on file  Social Connections: Not on file      Family  History  Problem Relation Age of Onset   Hypertension Mother    Hypertension Father    Diabetes Father    Hypertension Paternal Aunt    Asthma Neg Hx    Cancer Neg Hx    Heart disease Neg Hx    Stroke Neg Hx       ROS:   Please see the history of present illness.    Review of Systems  Constitutional:  Negative for chills and fever.  HENT:  Positive for congestion. Negative for nosebleeds and tinnitus.   Eyes:  Negative for blurred vision.  Respiratory:  Negative for shortness of breath (stable, unchanged from baseline).   Cardiovascular:  Negative for chest pain, palpitations, orthopnea, claudication, leg swelling  and PND.  Gastrointestinal:  Negative for blood in stool, melena, nausea and vomiting.  Genitourinary:  Negative for dysuria and hematuria.  Musculoskeletal:  Negative for falls.  Neurological:  Negative for dizziness, loss of consciousness and headaches.    All other systems reviewed and are negative.   Labs/EKG Reviewed:    EKG:  No new tracing   Recent Labs: 04/22/2022: ALT 51; B Natriuretic Peptide 109.9; BUN 5; Creatinine, Ser 0.61; Hemoglobin 9.9; Platelets 297; Potassium 3.8; Sodium 134   Recent Lipid Panel No results found for: "CHOL", "TRIG", "HDL", "CHOLHDL", "LDLCALC", "LDLDIRECT"  Physical Exam:    VS:  LMP 01/02/2022     Wt Readings from Last 3 Encounters:  07/05/22 139 lb 9.6 oz (63.3 kg)  06/21/22 136 lb (61.7 kg)  06/13/22 137 lb 9.6 oz (62.4 kg)     GEN:  Well nourished, well developed in no acute distress, comfortable HEENT: Normal NECK: No JVD; No carotid bruits CARDIAC: RRR, 2/6 systolic murmur heard throughout the precordium. No rubs or gallops RESPIRATORY:  CTAB ABDOMEN: Gravid, nontender to palpation MUSCULOSKELETAL:  No edema, warm SKIN: Warm and dry NEUROLOGIC:  Alert and oriented x 3 PSYCHIATRIC:  Normal affect    Risk Assessment/Risk Calculators:              ASSESSMENT & PLAN:    #Systolic HF with mildly  reduced EF: Patient admitted in 04/2022 for pyelonephritis with ECG revealing sinus tachycardia, possible LVH and inferior TWI. This abnormal ECG prompted TTE which revealed LVEF 40% with apical lateral hypokinesis, moderate-to-severe MR with mildly thickened valve for age. Unknown etiology as patient denies any personal or family history of CV disease, no known serious illnesses as a child or adult, and no history of hospitalization for volume overload/chest pain. States she had pre-eclampsia during first pregnancy at time of delivery but HTN resolved post-partum and she never required lasix. She reports chronic dyspnea on exertion that is worse with current pregnancy but no significant chest pain, palpitations, LE edema or orthopnea. In the hospital, trop negative x2 and BNP 109 with no evidence of volume overload. Will plan to manage medically throughout pregnancy with close monitoring of echoes and regular follow-up. Once she is no longer pregnancy, will proceed with more in-depth work-up including TEE/CMR and coronary CTA. -Discussed she is WHO class III with pregnancy, CARPREG score 5 corresponding to 40% chance of CV complications during pregnancy -Currently euvolemic and compensated -Metop held due to soft blood pressures -Continue serial monitoring of TTE; will move up next TTE due to episodes of dizziness -Once she is no longer pregnant, we can pursue further work-up with CMR/TEE for her valve and likely coronary CTA to assess coronary anatomy   #Moderate-to-severe MR: MV appears more thickened than would be expected for age but does not appear post-inflammatory. Denies any history of rheumatic fever, IVDU, blood stream infections, severe childhood illness or family history of disease. Likely this has been chronic and ongoing and patient is currently compensated and euvolemic on exam. Will manage HF as above. High risk of volume overload in pregnancy and will need to monitor her fluid status  closely. She will be monitored with serial echoes.  Once no longer pregnant, will pursue further work-up.  -Metop held due to soft blood pressures -Continue serial monitoring with echoes -Will need to watch volume status closely with pregnancy -Will pursue further work-up once patient is no longer pregnant as detailed above  #Palpitations: Patient reports several episodes of her heart  racing with associated SOB. Cardiac monitor pending.   #Orthostatic Hypotension: Improved with holding the metop  #History of Pre-Eclampsia: #High Risk Pregnancy: -Continue ASA '81mg'$  daily -BP controlled -Management per OBGYN -Followed by Cardio OB team   There are no Patient Instructions on file for this visit.     Follow-up: 3 weeks  Medication Adjustments/Labs and Tests Ordered: Current medicines are reviewed at length with the patient today.  Concerns regarding medicines are outlined above.  Tests Ordered: No orders of the defined types were placed in this encounter.  Medication Changes: No orders of the defined types were placed in this encounter.    Gwyndolyn Kaufman, MD

## 2022-07-13 ENCOUNTER — Ambulatory Visit: Payer: Medicaid Other | Attending: Cardiology

## 2022-07-13 ENCOUNTER — Encounter: Payer: Self-pay | Admitting: *Deleted

## 2022-07-13 ENCOUNTER — Telehealth: Payer: Self-pay | Admitting: *Deleted

## 2022-07-13 DIAGNOSIS — R002 Palpitations: Secondary | ICD-10-CM

## 2022-07-13 NOTE — Progress Notes (Unsigned)
Enrolled patient for a 3 day ZIo XT monitor to be mailed to patients home

## 2022-07-13 NOTE — Telephone Encounter (Signed)
Order for 3 day zio placed in the system.   Will send zio instructions to the pt via her active mychart account.   Will send a staff message to our monitor techs to enroll and mail the pt the zio.

## 2022-07-13 NOTE — Telephone Encounter (Signed)
-----  Message from Freada Bergeron, MD sent at 07/11/2022  4:31 PM EST ----- Can we send her a 3 day zio for palpitations?  Thank you!

## 2022-07-13 NOTE — Telephone Encounter (Signed)
-----  Message from Gerarda Gunther sent at 07/13/2022  9:13 AM EST ----- Regarding: RE: 3 day zio per Dr. Johney Frame Done :-) ----- Message ----- From: Nuala Alpha, LPN Sent: [id number]   9:10 AM EST To: Jennefer Bravo; Katrina Cloyd Stagers Subject: 3 day zio per Dr. Johney Frame                    Order for 3 day zio placed on this pt  Can you please enroll and let me know when you do?  Thanks [employer]   ----- Message ----- From: Freada Bergeron, MD Sent: 07/11/2022   4:32 PM EST To: [facility] Triage  Can we send her a 3 day zio for palpitations?  Thank you!

## 2022-07-16 ENCOUNTER — Ambulatory Visit: Payer: Medicaid Other | Attending: Cardiology | Admitting: Cardiology

## 2022-07-16 ENCOUNTER — Encounter: Payer: Self-pay | Admitting: Cardiology

## 2022-07-16 ENCOUNTER — Encounter: Payer: Self-pay | Admitting: *Deleted

## 2022-07-16 VITALS — BP 112/74 | HR 107 | Ht 64.0 in | Wt 140.4 lb

## 2022-07-16 DIAGNOSIS — O99412 Diseases of the circulatory system complicating pregnancy, second trimester: Secondary | ICD-10-CM

## 2022-07-16 DIAGNOSIS — R002 Palpitations: Secondary | ICD-10-CM

## 2022-07-16 DIAGNOSIS — I340 Nonrheumatic mitral (valve) insufficiency: Secondary | ICD-10-CM | POA: Diagnosis not present

## 2022-07-16 DIAGNOSIS — O0992 Supervision of high risk pregnancy, unspecified, second trimester: Secondary | ICD-10-CM

## 2022-07-16 DIAGNOSIS — Z3A28 28 weeks gestation of pregnancy: Secondary | ICD-10-CM

## 2022-07-16 MED ORDER — METOPROLOL SUCCINATE ER 25 MG PO TB24: 12.5000 mg | ORAL_TABLET | Freq: Every day | ORAL | 1 refills | Status: DC

## 2022-07-16 NOTE — Progress Notes (Signed)
Cardio-Obstetrics Clinic  Follow Up Note   Date:  07/16/2022   ID:  Malon Siddall, DOB [birth year], MRN [id number]  PCP:  Patient, No Pcp Per   [facility] Cardiologist:  Freada Bergeron, MD  Electrophysiologist:  None       Referring MD: No ref. provider found   Chief Complaint: Moderate to severe MR  History of Present Illness:    Alpha Chouinard is a 37 y.o. female [G5P1122] who returns for follow up of moderate to severe MR.  Patient was hospitalized in 04/2022 for pyelonephritis.  On admission, she was noted to by tachycardic and ECG was performed which revealed sinus tachycardia with borderline LVH and inferolateral TWI. Cardiology was called overnight and TTE was recommended which revealed  LVEF 40% with apical lateral hypokinesis, normal RV, mildly thickened and calcified mitral valve leaflet (abnormal for age) but no definitive post-inflammatory changes with moderate to severe MR. Trops negative x2. BNP 109. She appeared euvolemic on exam with no chest pain or SOB. We started metoprolol 12.'5mg'$  XL daily.  Notably, had pre-eclampsia with prior pregnancies at time of delivery. Resolved with delivery and did not require BP meds post-pregnancy.  Was seen in clinic on 05/07/22 where she was doing well from a CV standpoint. No HF symptoms. TTE 05/22/22 stable with LVEF 40-45%, normal RV, moderate to severe MR.   Was last seen 07/05/22 where she was having orthostatic symptoms with BP 90s at that time. We held her metop.  She then called clinic and reported episodes of palpitations. We ordered a zio monitor which is pending.  Today, the patient states that she is continuing to have episodes of palpitations. Symptoms have increased since stopping the metop. Episodes last 5-52mnutes before resolving. No known triggers and not exertional symptoms. No associated chest pain but has some mild lightheadedness but no syncope. Has been staying hydrated.     Prior CV Studies Reviewed: The following studies were reviewed today:  TTE 05/22/22: IMPRESSIONS     1. Left ventricular ejection fraction, by estimation, is 40 to 45%. Left  ventricular ejection fraction by 3D volume is 45 %. The left ventricle has  mildly decreased function. The left ventricle demonstrates global  hypokinesis. The left ventricular  internal cavity size was mildly dilated. Left ventricular diastolic  parameters were normal.   2. Right ventricular systolic function is normal. The right ventricular  size is normal. Tricuspid regurgitation signal is inadequate for assessing  PA pressure.   3. The mitral valve is normal in structure. Moderate to severe mitral  valve regurgitation.   4. The aortic valve is tricuspid. Aortic valve regurgitation is not  visualized. No aortic stenosis is present.   5. The inferior vena cava is normal in size with greater than 50%  respiratory variability, suggesting right atrial pressure of 3 mmHg.   Comparison(s): No significant change from prior study. Prior images  reviewed side by side.   Conclusion(s)/Recommendation(s): Consider TEE to clarify mechanism and  severity of mitral insufficiency.    TTE 04/22/22: IMPRESSIONS     1. Left ventricular ejection fraction, by estimation, is 40%. Left  ventricular ejection fraction by 3D volume is 40 %. The left ventricle has  mild to moderately decreased function. The left ventricle demonstrates  global hypokinesis and regional wall  motion abnormalities (see scoring diagram/findings for description). The  left ventricular internal cavity size was mildly dilated. Left ventricular  diastolic parameters were normal.   2. Right ventricular systolic function is  normal. The right ventricular  size is normal. Tricuspid regurgitation signal is inadequate for assessing  PA pressure.   3. The mitral valve is grossly normal. Moderate to severe mitral valve  regurgitation. The mitral valve  is mildly thickened for age, but no  definite post inflammatory change unless clinical relevance. Suspect  mechanism is secondary MR due to LV  dysfunction. No evidence of mitral stenosis.   4. The aortic valve was not well visualized. Aortic valve regurgitation  is not visualized. No aortic stenosis is present.   5. The inferior vena cava is normal in size with greater than 50%  respiratory variability, suggesting right atrial pressure of 3 mmHg.     Past Medical History:  Diagnosis Date   Bacterial infection    History of chicken pox    Systolic heart failure [facility])     Past Surgical History:  Procedure Laterality Date   CESAREAN SECTION     CESAREAN SECTION N/A 02/02/2015   Procedure: CESAREAN SECTION;  Surgeon: Thurnell Lose, MD;  Location: [place] [facility];  Service: Obstetrics;  Laterality: N/A;   DILATION AND CURETTAGE OF UTERUS     WISDOM TOOTH EXTRACTION        OB History     Gravida  5   Para  2   Term  1   Preterm  1   AB  2   Living  2      SAB  1   IAB  1   Ectopic      Multiple  0   Live Births  2               Current Medications: Current Meds  Medication Sig   metoprolol succinate (TOPROL XL) 25 MG 24 hr tablet Take 0.5 tablets (12.5 mg total) by mouth daily.     Allergies:   Penicillins   Social History   Socioeconomic History   Marital status: Single    Spouse name: Not on file   Number of children: Not on file   Years of education: Not on file   Highest education level: Not on file  Occupational History   Not on file  Tobacco Use   Smoking status: Never   Smokeless tobacco: Never  Vaping Use   Vaping Use: Not on file  Substance and Sexual Activity   Alcohol use: No   Drug use: No   Sexual activity: Yes    Birth control/protection: None  Other Topics Concern   Not on file  Social History Narrative   Not on file   Social Determinants of Health   Financial Resource Strain: Not on file  Food Insecurity: No Food  Insecurity (04/22/2022)   Hunger Vital Sign    Worried About Running Out of Food in the Last Year: Never true    Ran Out of Food in the Last Year: Never true  Transportation Needs: No Transportation Needs (04/22/2022)   PRAPARE - Hydrologist (Medical): No    Lack of Transportation (Non-Medical): No  Physical Activity: Not on file  Stress: Not on file  Social Connections: Not on file      Family History  Problem Relation Age of Onset   Hypertension Mother    Hypertension Father    Diabetes Father    Hypertension Paternal Aunt    Asthma Neg Hx    Cancer Neg Hx    Heart disease Neg Hx    Stroke Neg Hx  ROS:   Please see the history of present illness.    Review of Systems  Constitutional:  Negative for chills and fever.  HENT:  Positive for congestion. Negative for nosebleeds and tinnitus.   Eyes:  Negative for blurred vision.  Respiratory:  Negative for shortness of breath (stable, unchanged from baseline).   Cardiovascular:  Negative for chest pain, palpitations, orthopnea, claudication, leg swelling and PND.  Gastrointestinal:  Negative for blood in stool, melena, nausea and vomiting.  Genitourinary:  Negative for dysuria and hematuria.  Musculoskeletal:  Negative for falls.  Neurological:  Negative for dizziness, loss of consciousness and headaches.    All other systems reviewed and are negative.   Labs/EKG Reviewed:    EKG:  No new tracing   Recent Labs: 04/22/2022: ALT 51; B Natriuretic Peptide 109.9; BUN 5; Creatinine, Ser 0.61; Hemoglobin 9.9; Platelets 297; Potassium 3.8; Sodium 134   Recent Lipid Panel No results found for: "CHOL", "TRIG", "HDL", "CHOLHDL", "LDLCALC", "LDLDIRECT"  Physical Exam:    VS:  BP 112/74   Pulse (!) 107   Ht '5\' 4"'$  (1.626 m)   Wt 140 lb 6.4 oz (63.7 kg)   LMP 01/02/2022   SpO2 98%   BMI 24.10 kg/m     Wt Readings from Last 3 Encounters:  07/16/22 140 lb 6.4 oz (63.7 kg)  07/05/22 139 lb  9.6 oz (63.3 kg)  06/21/22 136 lb (61.7 kg)     GEN:  Well nourished, well developed in no acute distress, comfortable HEENT: Normal NECK: No JVD; No carotid bruits CARDIAC: RRR, 3/6 systolic murmur heard throughout the precordium. No rubs or gallops RESPIRATORY:  CTAB ABDOMEN: Gravid, nontender to palpation MUSCULOSKELETAL:  No edema, warm SKIN: Warm and dry NEUROLOGIC:  Alert and oriented x 3 PSYCHIATRIC:  Normal affect    Risk Assessment/Risk Calculators:              ASSESSMENT & PLAN:    #Systolic HF with mildly reduced EF: Patient admitted in 04/2022 for pyelonephritis with ECG revealing sinus tachycardia, possible LVH and inferior TWI. This abnormal ECG prompted TTE which revealed LVEF 40% with apical lateral hypokinesis, moderate-to-severe MR with mildly thickened valve for age. Unknown etiology as patient denies any personal or family history of CV disease, no known serious illnesses as a child or adult, and no history of hospitalization for volume overload/chest pain. States she had pre-eclampsia during first pregnancy at time of delivery but HTN resolved post-partum and she never required lasix. She reports chronic dyspnea on exertion that is worse with current pregnancy but no significant chest pain, palpitations, LE edema or orthopnea. In the hospital, trop negative x2 and BNP 109 with no evidence of volume overload. Will plan to manage medically throughout pregnancy with close monitoring of echoes and regular follow-up. Once she is no longer pregnancy, will proceed with more in-depth work-up including TEE/CMR and coronary CTA. -Discussed she is WHO class III with pregnancy, CARPREG score 5 corresponding to 40% chance of CV complications during pregnancy -Currently euvolemic and compensated -Resume metoprolol 12.'5mg'$  XL daily given increase in palpitations and BP improved today -Continue serial monitoring of TTE; will move up next TTE due to episodes of dizziness -Once she  is no longer pregnant, we can pursue further work-up with CMR/TEE for her valve and likely coronary CTA to assess coronary anatomy   #Moderate-to-severe MR: MV appears more thickened than would be expected for age but does not appear post-inflammatory. Denies any history of rheumatic fever, IVDU,  blood stream infections, severe childhood illness or family history of disease. Likely this has been chronic and ongoing and patient is currently compensated and euvolemic on exam. Will manage HF as above. High risk of volume overload in pregnancy and will need to monitor her fluid status closely.  Once no longer pregnant, will pursue further work-up.  -Resume metop 12.'5mg'$  XL daily as BP improved and now having palpitations -Continue serial monitoring with echoes -Will need to watch volume status closely with pregnancy -Will pursue further work-up once patient is no longer pregnant as detailed above  #Palpitations: Having episodes of palpitations lasting 5-75mnutes as above. No known triggers but did correlate with stopping metop. Given BP is improved, will resume metop. Zio monitor currently pending.  -Resume metop 12.'5mg'$  XL  -Check BMET, Mg -Zio monitor pending  #Dizziness: Improved. Given that she is now having more frequent palps, will trial back on metop and monitor response.  #History of Pre-Eclampsia: #High Risk Pregnancy: -Continue ASA '81mg'$  daily -BP controlled -Management per OBGYN -Followed by Cardio OB team   Patient Instructions  Medication Instructions:   RESTART BACK TAKING TOPROL XL 12.5 MG BY MOUTH DAILY  *If you need a refill on your cardiac medications before your next appointment, please call your pharmacy*   Lab Work:  TODAY--BMET AND MAGNESIUM LEVEL  If you have labs (blood work) drawn today and your tests are completely normal, you will receive your results only by: MAmanda Park(if you have MyChart) OR A paper copy in the mail If you have any lab test that  is abnormal or we need to change your treatment, we will call you to review the results.    Follow-Up:  3 WEEKS WITH DR. PJohney Frame--PLEASE SCHEDULE AT ANY OPEN SLOT ON 08/06/22--YOU CAN HAVE ANY OPEN NEW PT SLOT--OKAY PER DR. PJohney FrameAND IVY     Follow-up: 3 weeks  Medication Adjustments/Labs and Tests Ordered: Current medicines are reviewed at length with the patient today.  Concerns regarding medicines are outlined above.  Tests Ordered: Orders Placed This Encounter  Procedures   Basic metabolic panel   Magnesium   Medication Changes: Meds ordered this encounter  Medications   metoprolol succinate (TOPROL XL) 25 MG 24 hr tablet    Sig: Take 0.5 tablets (12.5 mg total) by mouth daily.    Dispense:  45 tablet    Refill:  1     HGwyndolyn Kaufman MD

## 2022-07-16 NOTE — Patient Instructions (Addendum)
Medication Instructions:   RESTART BACK TAKING TOPROL XL 12.5 MG BY MOUTH DAILY  *If you need a refill on your cardiac medications before your next appointment, please call your pharmacy*   Lab Work:  TODAY--BMET AND MAGNESIUM LEVEL  If you have labs (blood work) drawn today and your tests are completely normal, you will receive your results only by: [place] (if you have MyChart) OR A paper copy in the mail If you have any lab test that is abnormal or we need to change your treatment, we will call you to review the results.    Follow-Up:  3 WEEKS WITH DR. Johney Frame --PLEASE SCHEDULE AT ANY OPEN SLOT ON 08/06/22--YOU CAN HAVE ANY OPEN NEW PT SLOT--OKAY PER DR. Johney Frame AND Kacee Sukhu

## 2022-07-17 LAB — BASIC METABOLIC PANEL
BUN/Creatinine Ratio: 22 (ref 9–23)
BUN: 13 mg/dL (ref 6–20)
CO2: 19 mmol/L — ABNORMAL LOW (ref 20–29)
Calcium: 9.1 mg/dL (ref 8.7–10.2)
Chloride: 103 mmol/L (ref 96–106)
Creatinine, Ser: 0.59 mg/dL (ref 0.57–1.00)
Glucose: 83 mg/dL (ref 70–99)
Potassium: 4 mmol/L (ref 3.5–5.2)
Sodium: 138 mmol/L (ref 134–144)
eGFR: 120 mL/min/{1.73_m2} (ref >59)

## 2022-07-17 LAB — MAGNESIUM: Magnesium: 1.8 mg/dL (ref 1.6–2.3)

## 2022-07-18 ENCOUNTER — Ambulatory Visit: Payer: Medicaid Other | Admitting: Cardiology

## 2022-07-20 ENCOUNTER — Ambulatory Visit: Payer: Medicaid Other | Attending: Maternal & Fetal Medicine

## 2022-07-20 ENCOUNTER — Ambulatory Visit: Payer: Medicaid Other | Admitting: *Deleted

## 2022-07-20 ENCOUNTER — Ambulatory Visit (HOSPITAL_BASED_OUTPATIENT_CLINIC_OR_DEPARTMENT_OTHER): Payer: Medicaid Other

## 2022-07-20 VITALS — BP 118/71 | HR 79

## 2022-07-20 DIAGNOSIS — O09213 Supervision of pregnancy with history of pre-term labor, third trimester: Secondary | ICD-10-CM

## 2022-07-20 DIAGNOSIS — Z8759 Personal history of other complications of pregnancy, childbirth and the puerperium: Secondary | ICD-10-CM | POA: Diagnosis present

## 2022-07-20 DIAGNOSIS — I5020 Unspecified systolic (congestive) heart failure: Secondary | ICD-10-CM | POA: Insufficient documentation

## 2022-07-20 DIAGNOSIS — O99413 Diseases of the circulatory system complicating pregnancy, third trimester: Secondary | ICD-10-CM

## 2022-07-20 DIAGNOSIS — D582 Other hemoglobinopathies: Secondary | ICD-10-CM

## 2022-07-20 DIAGNOSIS — O34219 Maternal care for unspecified type scar from previous cesarean delivery: Secondary | ICD-10-CM

## 2022-07-20 DIAGNOSIS — I340 Nonrheumatic mitral (valve) insufficiency: Secondary | ICD-10-CM

## 2022-07-20 DIAGNOSIS — O09523 Supervision of elderly multigravida, third trimester: Secondary | ICD-10-CM | POA: Diagnosis not present

## 2022-07-20 DIAGNOSIS — Z3A28 28 weeks gestation of pregnancy: Secondary | ICD-10-CM

## 2022-07-20 DIAGNOSIS — O99412 Diseases of the circulatory system complicating pregnancy, second trimester: Secondary | ICD-10-CM

## 2022-07-20 DIAGNOSIS — O09529 Supervision of elderly multigravida, unspecified trimester: Secondary | ICD-10-CM | POA: Insufficient documentation

## 2022-07-20 DIAGNOSIS — O09293 Supervision of pregnancy with other poor reproductive or obstetric history, third trimester: Secondary | ICD-10-CM

## 2022-07-21 LAB — ECHOCARDIOGRAM COMPLETE
Area-P 1/2: 3.75 cm2
MV M vel: 5.16 m/s
MV Peak grad: 106.3 mmHg
S' Lateral: 4.1 cm

## 2022-07-23 ENCOUNTER — Other Ambulatory Visit (HOSPITAL_COMMUNITY): Payer: Medicaid Other

## 2022-07-23 DIAGNOSIS — R002 Palpitations: Secondary | ICD-10-CM

## 2022-07-27 ENCOUNTER — Ambulatory Visit (INDEPENDENT_AMBULATORY_CARE_PROVIDER_SITE_OTHER): Payer: Medicaid Other | Admitting: Obstetrics and Gynecology

## 2022-07-27 VITALS — BP 113/72 | HR 83 | Wt 140.0 lb

## 2022-07-27 DIAGNOSIS — O34219 Maternal care for unspecified type scar from previous cesarean delivery: Secondary | ICD-10-CM

## 2022-07-27 DIAGNOSIS — O0993 Supervision of high risk pregnancy, unspecified, third trimester: Secondary | ICD-10-CM | POA: Diagnosis not present

## 2022-07-27 DIAGNOSIS — I5020 Unspecified systolic (congestive) heart failure: Secondary | ICD-10-CM

## 2022-07-27 DIAGNOSIS — Z3A29 29 weeks gestation of pregnancy: Secondary | ICD-10-CM

## 2022-07-27 DIAGNOSIS — O0990 Supervision of high risk pregnancy, unspecified, unspecified trimester: Secondary | ICD-10-CM

## 2022-07-27 NOTE — Progress Notes (Signed)
Pt declined  Tdap

## 2022-07-27 NOTE — Progress Notes (Signed)
   PRENATAL VISIT NOTE  Subjective:  [name] is a 37 y.o. [id number] at 82w3dbeing seen today for ongoing prenatal care.  She is currently monitored for the following issues for this high-risk pregnancy and has Supervision of high risk pregnancy, antepartum; History of gestational hypertension; Multigravida of advanced maternal age in first trimester; History of preterm delivery, currently pregnant; UTI in pregnancy; Abnormal Pap smear of cervix; Pyelonephritis affecting pregnancy in second trimester; Systolic heart failure ([place]; Maternal mitral valve regurgitation affecting pregnancy in second trimester, antepartum; Previous cesarean delivery affecting pregnancy; and RED CHART ROUNDS on their problem list.  Patient reports heartburn.  Contractions: Not present. Vag. Bleeding: None.  Movement: Present. Denies leaking of fluid. Feels acid burning in her throat. Has not tried anything over the counter.   The following portions of the patient's history were reviewed and updated as appropriate: allergies, current medications, past family history, past medical history, past social history, past surgical history and problem list.   Objective:   Vitals:   07/27/22 0834  BP: 113/72  Pulse: 83  Weight: 140 lb (63.5 kg)    Fetal Status: Fetal Heart Rate (bpm): 143 Fundal Height: 28 cm Movement: Present     General:  Alert, oriented and cooperative. Patient is in no acute distress.  Skin: Skin is warm and dry. No rash noted.   Cardiovascular: Normal heart rate noted  Respiratory: Normal respiratory effort, no problems with respiration noted  Abdomen: Soft, gravid, appropriate for gestational age.  Pain/Pressure: Absent     Pelvic: Cervical exam deferred        Extremities: Normal range of motion.  Edema: None  Mental Status: Normal mood and affect. Normal behavior. Normal judgment and thought content.   Assessment and Plan:  Pregnancy: [id number] 289w3d. Supervision of high risk  pregnancy, antepartum  - CBC - HIV antibody (with reflex) - RPR - Glucose Tolerance, 2 Hours w/1 Hour - Declined TDAP -BP good today - Ok to use OTC pepcid for heart burn.   2. Systolic heart failure, unspecified HF chronicity ([place] Last echo done on 2/2, and stable. Mild reducing pumping function and moderate to severe leakiness of her valve. She is taking metoprolol and doing well, very minimal symptoms of SOB and heart palpations.  Continue close f/u and growth [place]  3. Previous cesarean delivery affecting pregnancy  ?IUGR and may be delivered prior to 39 weeks. Plan to discuss delivery plan with MD next visit.   Preterm labor symptoms and general obstetric precautions including but not limited to vaginal bleeding, contractions, leaking of fluid and fetal movement were reviewed in detail with the patient. Please refer to After Visit Summary for other counseling recommendations.   Return MD only, RED chart.  Future Appointments  Date Time Provider De[place]2/19/2024  1:40 PM PeFreada BergeronMD [facility]  08/16/2022  9:30 AM DuRadene GunningMD [facility]/06/2022  9:15 AM [facility] NURSE [facility]3/06/2022  9:30 AM [facility] US3 [facility]FCUS WMDecaturNP

## 2022-07-28 ENCOUNTER — Encounter: Payer: Self-pay | Admitting: Obstetrics and Gynecology

## 2022-07-28 DIAGNOSIS — D649 Anemia, unspecified: Secondary | ICD-10-CM | POA: Insufficient documentation

## 2022-07-28 LAB — CBC
Hematocrit: 34.5 % (ref 34.0–46.6)
Hemoglobin: 11.4 g/dL (ref 11.1–15.9)
MCH: 26.6 pg (ref 26.6–33.0)
MCHC: 33 g/dL (ref 31.5–35.7)
MCV: 81 fL (ref 79–97)
Platelets: 344 10*3/uL (ref 150–450)
RBC: 4.28 x10E6/uL (ref 3.77–5.28)
RDW: 14.7 % (ref 11.7–15.4)
WBC: 6.9 10*3/uL (ref 3.4–10.8)

## 2022-07-28 LAB — GLUCOSE TOLERANCE, 2 HOURS W/ 1HR
Glucose, 1 hour: 101 mg/dL (ref 70–179)
Glucose, 2 hour: 84 mg/dL (ref 70–152)
Glucose, Fasting: 78 mg/dL (ref 70–91)

## 2022-07-28 LAB — RPR: RPR Ser Ql: NONREACTIVE

## 2022-07-28 LAB — HIV ANTIBODY (ROUTINE TESTING W REFLEX): HIV Screen 4th Generation wRfx: NONREACTIVE

## 2022-08-01 ENCOUNTER — Encounter: Payer: Self-pay | Admitting: Cardiology

## 2022-08-01 DIAGNOSIS — I340 Nonrheumatic mitral (valve) insufficiency: Secondary | ICD-10-CM

## 2022-08-01 DIAGNOSIS — R002 Palpitations: Secondary | ICD-10-CM

## 2022-08-01 DIAGNOSIS — O0992 Supervision of high risk pregnancy, unspecified, second trimester: Secondary | ICD-10-CM

## 2022-08-01 MED ORDER — METOPROLOL SUCCINATE ER 25 MG PO TB24
12.5000 mg | ORAL_TABLET | Freq: Every day | ORAL | 3 refills | Status: DC
  Filled 2022-08-10: qty 45, 90d supply, fill #0

## 2022-08-03 ENCOUNTER — Other Ambulatory Visit (HOSPITAL_COMMUNITY): Payer: Medicaid Other

## 2022-08-05 ENCOUNTER — Encounter (HOSPITAL_COMMUNITY): Payer: Self-pay | Admitting: Obstetrics & Gynecology

## 2022-08-05 ENCOUNTER — Inpatient Hospital Stay (HOSPITAL_COMMUNITY)
Admission: AD | Admit: 2022-08-05 | Discharge: 2022-08-06 | Disposition: A | Discharge status: Home / Self Care | Payer: Medicaid Other | Attending: Obstetrics & Gynecology | Admitting: Obstetrics & Gynecology

## 2022-08-05 DIAGNOSIS — R030 Elevated blood-pressure reading, without diagnosis of hypertension: Secondary | ICD-10-CM | POA: Insufficient documentation

## 2022-08-05 DIAGNOSIS — R8789 Other abnormal findings in specimens from female genital organs: Secondary | ICD-10-CM

## 2022-08-05 DIAGNOSIS — O34219 Maternal care for unspecified type scar from previous cesarean delivery: Secondary | ICD-10-CM | POA: Insufficient documentation

## 2022-08-05 DIAGNOSIS — Z3A30 30 weeks gestation of pregnancy: Secondary | ICD-10-CM

## 2022-08-05 DIAGNOSIS — O09213 Supervision of pregnancy with history of pre-term labor, third trimester: Secondary | ICD-10-CM | POA: Diagnosis not present

## 2022-08-05 DIAGNOSIS — O26893 Other specified pregnancy related conditions, third trimester: Secondary | ICD-10-CM | POA: Insufficient documentation

## 2022-08-05 DIAGNOSIS — O09899 Supervision of other high risk pregnancies, unspecified trimester: Secondary | ICD-10-CM

## 2022-08-05 DIAGNOSIS — Z3A34 34 weeks gestation of pregnancy: Secondary | ICD-10-CM | POA: Diagnosis not present

## 2022-08-05 DIAGNOSIS — O09523 Supervision of elderly multigravida, third trimester: Secondary | ICD-10-CM | POA: Insufficient documentation

## 2022-08-05 DIAGNOSIS — R102 Pelvic and perineal pain: Secondary | ICD-10-CM | POA: Diagnosis not present

## 2022-08-05 LAB — URINALYSIS, ROUTINE W REFLEX MICROSCOPIC
Bilirubin Urine: NEGATIVE
Glucose, UA: NEGATIVE mg/dL
Hgb urine dipstick: NEGATIVE
Ketones, ur: NEGATIVE mg/dL
Leukocytes,Ua: NEGATIVE
Nitrite: NEGATIVE
Protein, ur: NEGATIVE mg/dL
Specific Gravity, Urine: 1.01 (ref 1.005–1.030)
pH: 6 (ref 5.0–8.0)

## 2022-08-05 LAB — FETAL FIBRONECTIN: Fetal Fibronectin: POSITIVE — AB

## 2022-08-05 MED ORDER — BETAMETHASONE SOD PHOS & ACET 6 (3-3) MG/ML IJ SUSP: 12.0000 mg | Freq: Once | INTRAMUSCULAR | Status: DC

## 2022-08-05 NOTE — MAU Provider Note (Signed)
History     CSN: [id number]  Arrival date and time: 08/05/22 2147   None     Chief Complaint  Patient presents with   Pelvic Pain   HPI [name] is a 37 y.o. [id number] at 38w5dwho presents to MAU for pelvic pressure. She reports ongoing pelvic pressure for the past 2-3 days which worsened yesterday. She reports pressure is aggravated by standing and walking. She reports occasional sharp pains in her sides and pelvis but denies contractions. No vaginal bleeding, leaking fluid, itching or odor. She is not having any urinary s/s. No recent intercourse or cervical exams. Endorses active fetal movement.  Pregnancy is significant for systolic heart failure, mitral regurgitation, 1 preterm delivery at 36 weeks as well as 2 previous c-sections. [facility] on [facility].   OB History     Gravida  5   Para  2   Term  1   Preterm  1   AB  2   Living  2      SAB  1   IAB  1   Ectopic      Multiple  0   Live Births  2           Past Medical History:  Diagnosis Date   Bacterial infection    History of chicken pox    Systolic heart failure ([place]     Past Surgical History:  Procedure Laterality Date   CESAREAN SECTION     CESAREAN SECTION N/A 02/02/2015   Procedure: CESAREAN SECTION;  Surgeon: EThurnell Lose MD;  [place];  Service: Obstetrics;  Laterality: N/A;   DILATION AND CURETTAGE OF UTERUS     WISDOM TOOTH EXTRACTION      Family History  Problem Relation Age of Onset   Hypertension Mother    Hypertension Father    Diabetes Father    Hypertension Paternal Aunt    Asthma Neg Hx    Cancer Neg Hx    Heart disease Neg Hx    Stroke Neg Hx     Social History   Tobacco Use   Smoking status: Never   Smokeless tobacco: Never  Vaping Use   Vaping Use: Never used  Substance Use Topics   Alcohol use: No   Drug use: No    Allergies:  Allergies  Allergen Reactions   Other Other (See Comments)    Penicillins Rash    No medications prior to admission.   Review of Systems  Constitutional: Negative.   Gastrointestinal: Negative.   Genitourinary:  Positive for pelvic pain.  All other systems reviewed and are negative.  Physical Exam  Patient Vitals for the past 24 hrs:  BP Temp Pulse Resp SpO2 Height Weight  08/06/22 0100 131/88 -- 96 -- 98 % -- --  08/06/22 0045 (!) 140/89 -- 99 -- 97 % -- --  08/06/22 0030 (!) 142/91 -- 97 -- 97 % -- --  08/06/22 0015 (!) 149/93 -- 99 -- 98 % -- --  08/06/22 0000 (!) 146/92 -- 94 -- 98 % -- --  08/05/22 2355 (!) 142/91 -- 91 -- 98 % -- --  08/05/22 2350 (!) 148/91 -- 94 -- 98 % -- --  08/05/22 2218 139/86 -- 96 18 99 % -- --  08/05/22 2159 134/88 98.1 F (36.7 C) 96 18 -- '5\' 4"'$  (1.626 m) 65.8 kg   Physical Exam Vitals and nursing note reviewed. Exam conducted with a chaperone present.  Constitutional:      General: She is not in acute distress. Eyes:     Extraocular Movements: Extraocular movements intact.     Pupils: Pupils are equal, round, and reactive to light.  Cardiovascular:     Rate and Rhythm: Normal rate.  Pulmonary:     Effort: Pulmonary effort is normal. No respiratory distress.  Abdominal:     Palpations: Abdomen is soft.     Tenderness: There is no abdominal tenderness.     Comments: Gravid    Genitourinary:    Comments: FFN collected Musculoskeletal:        General: Normal range of motion.     Cervical back: Normal range of motion.  Skin:    General: Skin is warm and dry.  Neurological:     General: No focal deficit present.     Mental Status: She is alert and oriented to person, place, and time.  Psychiatric:        Mood and Affect: Mood normal.        Behavior: Behavior normal.   Dilation: 1 Effacement (%): 80 Station: -1 Presentation: Vertex Exam by:: Maryagnes Amos, CNM  NST FHR: 145 bpm, moderate variability, +15x15 accels, no decels Toco: quiet  Results for orders placed or performed during  the hospital encounter of 08/05/22 (from the past 24 hour(s))  Urinalysis, Routine w reflex microscopic -Urine, Clean Catch     Status: None   Collection Time: 08/05/22 10:02 PM  Result Value Ref Range   Color, Urine YELLOW YELLOW   APPearance CLEAR CLEAR   Specific Gravity, Urine 1.010 1.005 - 1.030   pH 6.0 5.0 - 8.0   Glucose, UA NEGATIVE NEGATIVE mg/dL   Hgb urine dipstick NEGATIVE NEGATIVE   Bilirubin Urine NEGATIVE NEGATIVE   Ketones, ur NEGATIVE NEGATIVE mg/dL   Protein, ur NEGATIVE NEGATIVE mg/dL   Nitrite NEGATIVE NEGATIVE   Leukocytes,Ua NEGATIVE NEGATIVE  Protein / creatinine ratio, urine     Status: None   Collection Time: 08/05/22 10:02 PM  Result Value Ref Range   Creatinine, Urine 30 mg/dL   Total Protein, Urine <6 mg/dL   Protein Creatinine Ratio        0.00 - 0.15 mg/mg[Cre]  Fetal fibronectin     Status: Abnormal   Collection Time: 08/05/22 10:41 PM  Result Value Ref Range   Fetal Fibronectin POSITIVE (A) NEGATIVE  CBC     Status: Abnormal   Collection Time: 08/06/22 12:05 AM  Result Value Ref Range   WBC 8.6 4.0 - 10.5 K/uL   RBC 3.97 3.87 - 5.11 MIL/uL   Hemoglobin 10.5 (L) 12.0 - 15.0 g/dL   HCT 29.8 (L) 36.0 - 46.0 %   MCV 75.1 (L) 80.0 - 100.0 fL   MCH 26.4 26.0 - 34.0 pg   MCHC 35.2 30.0 - 36.0 g/dL   RDW 14.3 11.5 - 15.5 %   Platelets 304 150 - 400 K/uL   nRBC 0.0 0.0 - 0.2 %  Comprehensive metabolic panel     Status: Abnormal   Collection Time: 08/06/22 12:05 AM  Result Value Ref Range   Sodium 134 (L) 135 - 145 mmol/L   Potassium 3.5 3.5 - 5.1 mmol/L   Chloride 106 98 - 111 mmol/L   CO2 21 (L) 22 - 32 mmol/L   Glucose, Bld 96 70 - 99 mg/dL   BUN 5 (L) 6 - 20 mg/dL   Creatinine, Ser 0.46 0.44 - 1.00 mg/dL   Calcium 8.5 (L)  8.9 - 10.3 mg/dL   Total Protein 6.4 (L) 6.5 - 8.1 g/dL   Albumin 2.8 (L) 3.5 - 5.0 g/dL   AST 43 (H) 15 - 41 U/L   ALT 40 0 - 44 U/L   Alkaline Phosphatase 67 38 - 126 U/L   Total Bilirubin 0.5 0.3 - 1.2 mg/dL   GFR,  Estimated >60 >60 mL/min   Anion gap 7 5 - 15   MAU Course  Procedures  MDM UA FFN NST CBC, CMP, UPCR  UA negative. NST reassuring for gestational age. Toco quiet. FFN collected. Cervix 1/80/-1. Given history of preterm delivery at 36 weeks, FFN was sent. Patient denies feeling contractions. Patient was offered Tylenol and Flexeril however declines.  FFN is positive. I discussed with patient and her husband that this does not mean that she will go into preterm labor, but could mean there is an increased risk for preterm labor in the next 2 weeks. I did make recommendation for betamethasone to help with lung maturity and development in the event that she were to go into preterm labor. After discussion between patient and her husband she opts to decline noting that she "does not like to take medications during pregnancy". I reviewed that she does have a history of a preterm delivery therefore does have an increased risk of preterm delivery. She again opts to decline betamethasone.   At discharge, patient noted to have 2 elevated BPs. She is asymptomatic. Will add pre-eclampsia labs.   Pre-e labs reassuring. Patient remains asymptomatic. She also remains without contractions. Will send message to office for BP check in the next 1-2 days. Patient was given strict return precautions.   Assessment and Plan   1. Previous cesarean delivery affecting pregnancy   2. [redacted] weeks gestation of pregnancy   3. Pelvic pain affecting pregnancy in third trimester, antepartum   4. Positive fetal fibronectin at 22 weeks to [redacted] weeks gestation   5. Elevated BP without diagnosis of hypertension    - Discharge home in stable condition - Strict return precautions reviewed. Return to MAU for new/worsening symptoms - BP check in 1-2 days. Office notified - Keep OB appointment as scheduled  Renee Harder, CNM 08/06/2022, 1:42 AM

## 2022-08-05 NOTE — MAU Provider Note (Incomplete)
History     CSN: [id number]  Arrival date and time: 08/05/22 2147   None     Chief Complaint  Patient presents with  . Pelvic Pain   HPI [name] is a 37 y.o. [id number] at 35w5dwho presents to MAU for pelvic pressure. She reports ongoing pelvic pressure for the past 2-3 days which worsened yesterday. She reports pressure is aggravated by standing and walking. She reports occasional sharp pains in her sides and pelvis but denies contractions. No vaginal bleeding, leaking fluid, itching or odor. She is not having any urinary s/s. No recent intercourse or cervical exams. Endorses active fetal movement.  Pregnancy is significant for systolic heart failure, mitral regurgitation, 1 preterm delivery at 36 weeks as well as 2 previous c-sections. [facility]next appointment on 2/29.   OB History     Gravida  5   Para  2   Term  1   Preterm  1   AB  2   Living  2      SAB  1   IAB  1   Ectopic      Multiple  0   Live Births  2           Past Medical History:  Diagnosis Date  . Bacterial infection   . History of chicken pox   . Systolic heart failure ([facility]     Past Surgical History:  Procedure Laterality Date  . CESAREAN SECTION    . CESAREAN SECTION N/A 02/02/2015   Procedure: CESAREAN SECTION;  Surgeon: EThurnell Lose MD;  [place];  Service: Obstetrics;  Laterality: N/A;  . DILATION AND CURETTAGE OF UTERUS    . WISDOM TOOTH EXTRACTION      Family History  Problem Relation Age of Onset  . Hypertension Mother   . Hypertension Father   . Diabetes Father   . Hypertension Paternal Aunt   . Asthma Neg Hx   . Cancer Neg Hx   . Heart disease Neg Hx   . Stroke Neg Hx     Social History   Tobacco Use  . Smoking status: Never  . Smokeless tobacco: Never  Vaping Use  . Vaping Use: Never used  Substance Use Topics  . Alcohol use: No  . Drug use: No    Allergies:  Allergies  Allergen Reactions  . Other Other  (See Comments)  . Penicillins Rash    Medications Prior to Admission  Medication Sig Dispense Refill Last Dose  . aspirin (ASPIRIN CHILDRENS) 81 MG chewable tablet Chew 1 tablet (81 mg total) by mouth daily. 90 tablet 1 08/05/2022  . metoprolol succinate (TOPROL XL) 25 MG 24 hr tablet Take 0.5 tablets (12.5 mg total) by mouth daily. 45 tablet 3 Past Week  . Prenatal MV & Min w/FA-DHA ([employer] PRENATAL GUMMY) 0.18-25 MG CHEW Chew 1 tablet by mouth daily. 30 tablet 3 08/05/2022   Review of Systems  Constitutional: Negative.   Gastrointestinal: Negative.   Genitourinary:  Positive for pelvic pain.  All other systems reviewed and are negative.  Physical Exam  Patient Vitals for the past 24 hrs:  BP Temp Pulse Resp SpO2 Height Weight  08/05/22 2218 139/86 -- 96 18 99 % -- --  08/05/22 2159 134/88 98.1 F (36.7 C) 96 18 -- 5' 4"$  (1.626 m) 65.8 kg   Physical Exam Vitals and nursing note reviewed. Exam conducted with a chaperone present.  Constitutional:  General: She is not in acute distress. Eyes:     Extraocular Movements: Extraocular movements intact.     Pupils: Pupils are equal, round, and reactive to light.  Cardiovascular:     Rate and Rhythm: Normal rate.  Pulmonary:     Effort: Pulmonary effort is normal. No respiratory distress.  Abdominal:     Palpations: Abdomen is soft.     Tenderness: There is no abdominal tenderness.     Comments: Gravid    Genitourinary:    Comments: FFN collected Musculoskeletal:        General: Normal range of motion.     Cervical back: Normal range of motion.  Skin:    General: Skin is warm and dry.  Neurological:     General: No focal deficit present.     Mental Status: She is alert and oriented to person, place, and time.  Psychiatric:        Mood and Affect: Mood normal.        Behavior: Behavior normal.   Dilation: 1 Effacement (%): 80 Station: -1 Exam by:: Maryagnes Amos, CNM  NST FHR: 145 bpm, moderate variability, +15x15  accels, no decels Toco: quiet  Results for orders placed or performed during the hospital encounter of 08/05/22 (from the past 24 hour(s))  Urinalysis, Routine w reflex microscopic -Urine, Clean Catch     Status: None   Collection Time: 08/05/22 10:02 PM  Result Value Ref Range   Color, Urine YELLOW YELLOW   APPearance CLEAR CLEAR   Specific Gravity, Urine 1.010 1.005 - 1.030   pH 6.0 5.0 - 8.0   Glucose, UA NEGATIVE NEGATIVE mg/dL   Hgb urine dipstick NEGATIVE NEGATIVE   Bilirubin Urine NEGATIVE NEGATIVE   Ketones, ur NEGATIVE NEGATIVE mg/dL   Protein, ur NEGATIVE NEGATIVE mg/dL   Nitrite NEGATIVE NEGATIVE   Leukocytes,Ua NEGATIVE NEGATIVE  Fetal fibronectin     Status: Abnormal   Collection Time: 08/05/22 10:41 PM  Result Value Ref Range   Fetal Fibronectin POSITIVE (A) NEGATIVE   MAU Course  Procedures  MDM UA FFN NST  UA negative. NST reassuring for gestational age. Toco quiet. FFN collected. Cervix 1/80/-1. Given history of preterm delivery at 36 weeks, FFN was sent. Patient denies feeling contractions. Patient was offered Tylenol and Flexeril however declines.  FFN is positive. I discussed with patient and her husband that this does not mean that she will go into preterm labor, but could mean there is an increased risk for preterm labor.   Assessment and Plan   1. Previous cesarean delivery affecting pregnancy   2. [redacted] weeks gestation of pregnancy   3. Pelvic pain affecting pregnancy in third trimester, antepartum   4. Positive fetal fibronectin at 22 weeks to [redacted] weeks gestation      Renee Harder, [place] 08/05/2022, 11:23 PM

## 2022-08-05 NOTE — MAU Note (Signed)
.  [name] is a 37 y.o. at 19w5dhere in MAU reporting: increased pelvic pressure for the past 2-3 days. Much more than usual. Uncomfortable to stand or walk for any period of time. Denies any vag bleeding or leaking some  increase in clear mucusy discharge. Good fetal movement reported. LMP:  Onset of complaint: 2-3 days Pain score: 7 Vitals:   08/05/22 2159  BP: 134/88  Pulse: 96  Resp: 18  Temp: 98.1 F (36.7 C)     FHT:151  Lab orders placed from triage:  u/a

## 2022-08-06 ENCOUNTER — Ambulatory Visit: Payer: Medicaid Other | Admitting: Cardiology

## 2022-08-06 DIAGNOSIS — O09899 Supervision of other high risk pregnancies, unspecified trimester: Secondary | ICD-10-CM

## 2022-08-06 DIAGNOSIS — R8789 Other abnormal findings in specimens from female genital organs: Secondary | ICD-10-CM

## 2022-08-06 LAB — COMPREHENSIVE METABOLIC PANEL
ALT: 40 U/L (ref 0–44)
AST: 43 U/L — ABNORMAL HIGH (ref 15–41)
Albumin: 2.8 g/dL — ABNORMAL LOW (ref 3.5–5.0)
Alkaline Phosphatase: 67 U/L (ref 38–126)
Anion gap: 7 (ref 5–15)
BUN: 5 mg/dL — ABNORMAL LOW (ref 6–20)
CO2: 21 mmol/L — ABNORMAL LOW (ref 22–32)
Calcium: 8.5 mg/dL — ABNORMAL LOW (ref 8.9–10.3)
Chloride: 106 mmol/L (ref 98–111)
Creatinine, Ser: 0.46 mg/dL (ref 0.44–1.00)
GFR, Estimated: >60 mL/min (ref >60)
Glucose, Bld: 96 mg/dL (ref 70–99)
Potassium: 3.5 mmol/L (ref 3.5–5.1)
Sodium: 134 mmol/L — ABNORMAL LOW (ref 135–145)
Total Bilirubin: 0.5 mg/dL (ref 0.3–1.2)
Total Protein: 6.4 g/dL — ABNORMAL LOW (ref 6.5–8.1)

## 2022-08-06 LAB — PROTEIN / CREATININE RATIO, URINE
Creatinine, Urine: 30 mg/dL
Total Protein, Urine: <6 mg/dL

## 2022-08-06 LAB — CBC
HCT: 29.8 % — ABNORMAL LOW (ref 36.0–46.0)
Hemoglobin: 10.5 g/dL — ABNORMAL LOW (ref 12.0–15.0)
MCH: 26.4 pg (ref 26.0–34.0)
MCHC: 35.2 g/dL (ref 30.0–36.0)
MCV: 75.1 fL — ABNORMAL LOW (ref 80.0–100.0)
Platelets: 304 10*3/uL (ref 150–400)
RBC: 3.97 MIL/uL (ref 3.87–5.11)
RDW: 14.3 % (ref 11.5–15.5)
WBC: 8.6 10*3/uL (ref 4.0–10.5)
nRBC: 0 % (ref 0.0–0.2)

## 2022-08-08 ENCOUNTER — Other Ambulatory Visit (HOSPITAL_COMMUNITY): Payer: Self-pay

## 2022-08-09 ENCOUNTER — Ambulatory Visit (INDEPENDENT_AMBULATORY_CARE_PROVIDER_SITE_OTHER): Payer: Medicaid Other | Admitting: Obstetrics and Gynecology

## 2022-08-09 VITALS — BP 126/79 | HR 109 | Wt 144.0 lb

## 2022-08-09 DIAGNOSIS — I340 Nonrheumatic mitral (valve) insufficiency: Secondary | ICD-10-CM

## 2022-08-09 DIAGNOSIS — I5020 Unspecified systolic (congestive) heart failure: Secondary | ICD-10-CM

## 2022-08-09 DIAGNOSIS — O34219 Maternal care for unspecified type scar from previous cesarean delivery: Secondary | ICD-10-CM

## 2022-08-09 DIAGNOSIS — O09893 Supervision of other high risk pregnancies, third trimester: Secondary | ICD-10-CM

## 2022-08-09 DIAGNOSIS — O0990 Supervision of high risk pregnancy, unspecified, unspecified trimester: Secondary | ICD-10-CM

## 2022-08-09 DIAGNOSIS — Z3A31 31 weeks gestation of pregnancy: Secondary | ICD-10-CM

## 2022-08-09 DIAGNOSIS — Z8759 Personal history of other complications of pregnancy, childbirth and the puerperium: Secondary | ICD-10-CM

## 2022-08-09 DIAGNOSIS — O99413 Diseases of the circulatory system complicating pregnancy, third trimester: Secondary | ICD-10-CM

## 2022-08-09 DIAGNOSIS — O09899 Supervision of other high risk pregnancies, unspecified trimester: Secondary | ICD-10-CM

## 2022-08-09 NOTE — Progress Notes (Signed)
   PRENATAL VISIT NOTE  Subjective:  [name] is a 37 y.o. [id number] at 17w2dbeing seen today for ongoing prenatal care.  She is currently monitored for the following issues for this high-risk pregnancy and has Supervision of high risk pregnancy, antepartum; History of gestational hypertension; Multigravida of advanced maternal age in first trimester; History of preterm delivery, currently pregnant; UTI in pregnancy; Abnormal Pap smear of cervix; Pyelonephritis affecting pregnancy in second trimester; Systolic heart failure ([place]; Maternal mitral valve regurgitation affecting pregnancy in second trimester, antepartum; Previous cesarean delivery affecting pregnancy; RED CHART ROUNDS; Anemia; and Positive fetal fibronectin at 22 weeks to [redacted] weeks gestation on their problem list.  Presenting today for MAU follow up. Was seen for pelvic pressure, SCE 1/80/-1 and stable, but FFN+. During that time, also noted to have mild range BP. Labs w/ slight elevation in AST at 43, but this is improved from baseline. Otherwise labs & P/C normal and reassuring.   Today, reports stable pelvic pressure. Denies HA, visual changes, CP/SOB, RUQ/epigastric pain   Contractions: Not present. Vag. Bleeding: None.  Movement: Present. Denies leaking of fluid.   Objective:   Vitals:   08/09/22 1423  BP: 126/79  Pulse: (!) 109  Weight: 144 lb (65.3 kg)    Fetal Status: Fetal Heart Rate (bpm): 146   Movement: Present     General:  Alert, oriented and cooperative. Patient is in no acute distress.  Skin: Skin is warm and dry. No rash noted.   Cardiovascular: Normal heart rate noted  Respiratory: Normal respiratory effort, no problems with respiration noted  Abdomen: Soft, gravid, appropriate for gestational age.  Pain/Pressure: Absent      Assessment and Plan:  Pregnancy: [id number] 372w2d. Supervision of high risk pregnancy, antepartum 2. [redacted] weeks gestation of pregnancy 3. History of preterm delivery,  currently pregnant 4. Positive fetal fibronectin at 22 weeks to [redacted] weeks gestation Reviewed strategies for managing pelvic pressure related to pregnancy Discussed signs/symptoms of PTL and MAU precautions  5. Systolic heart failure, unspecified HF chronicity ([place]. Maternal mitral valve regurgitation affecting pregnancy in second trimester, antepartum 7. History of gestational hypertension - Normotensive today. Will continue to monitor Bps closely - Echo 07/20/22: LVEF 45-50%, mild global reduction in LV function, moderate mitral regurgitation; overall stable from prior - S/p cardiac monitor - per Dr. PeJacolyn Reedyesult comment today, no significant arrhythmias and no indication to change medications at this time - Continue metoprolol - Next appt w/ cards scheduled for 3/8  8. Previous cesarean delivery affecting pregnancy  Will f/u with Dr. DuDamita Dunningsn 2/29 for ROB as scheduled.  Future Appointments  Date Time Provider De[place]2/29/2024  9:30 AM DuRadene GunningMD [facility]3/06/2022  9:15 AM [facility] NURSE [facility]3/06/2022  9:30 AM [facility] US3 [facility]3/01/2023  1:20 PM PeJohney FrameHeGreer EeMD [facility] None    KyInez CatalinaMD

## 2022-08-09 NOTE — Progress Notes (Signed)
Pt seen in ED over the wkend and was told she was dilated 1 cm and had a ? Pos FFN

## 2022-08-10 ENCOUNTER — Other Ambulatory Visit: Payer: Self-pay

## 2022-08-10 ENCOUNTER — Other Ambulatory Visit (HOSPITAL_COMMUNITY): Payer: Self-pay

## 2022-08-13 ENCOUNTER — Other Ambulatory Visit: Payer: Self-pay

## 2022-08-15 NOTE — Progress Notes (Unsigned)
PRENATAL VISIT NOTE  Subjective:  [name] is a 37 y.o. [id number] at 42w2dbeing seen today for ongoing prenatal care.  She is currently monitored for the following issues for this high-risk pregnancy and has Supervision of high risk pregnancy, antepartum; History of gestational hypertension; Multigravida of advanced maternal age in first trimester; History of preterm delivery, currently pregnant; UTI in pregnancy; Abnormal Pap smear of cervix; Pyelonephritis affecting pregnancy in second trimester; Systolic heart failure ([place]; Maternal mitral valve regurgitation affecting pregnancy in second trimester, antepartum; Previous cesarean delivery affecting pregnancy; RED CHART ROUNDS; Anemia; and Positive fetal fibronectin at 22 weeks to [redacted] weeks gestation on their problem list.  Patient reports {sx:14538}.   .  .   . Denies leaking of fluid.   The following portions of the patient's history were reviewed and updated as appropriate: allergies, current medications, past family history, past medical history, past social history, past surgical history and problem list.   Objective:  There were no vitals filed for this visit.  Fetal Status:           General:  Alert, oriented and cooperative. Patient is in no acute distress.  Skin: Skin is warm and dry. No rash noted.   Cardiovascular: Normal heart rate noted  Respiratory: Normal respiratory effort, no problems with respiration noted  Abdomen: Soft, gravid, appropriate for gestational age.        Pelvic: Cervical exam deferred        Extremities: Normal range of motion.     Mental Status: Normal mood and affect. Normal behavior. Normal judgment and thought content.   Assessment and Plan:  Pregnancy: [id number] 365w2d. Maternal mitral valve regurgitation affecting pregnancy in second trimester, antepartum Last echo was on 2/2 - EF stable.   2. Systolic heart failure, unspecified HF chronicity (HC[place]ufftonSent new message to Dr. WoLanetta Inche:  plan now that she will be for c-section no later than 37w.  Following with Dr. PeJohney Framelosely. Of note, from last appt on 1/29: WHO class III with pregnancy, CARPREG score 5 corresponding to 40% chance of CV complications during pregnancy  Resumed her Metoprolol 12.5 mg XL for palpitations.   3. Pyelonephritis affecting pregnancy in second trimester Continue suppression until delivery.   4. Supervision of high risk pregnancy, antepartum Discussed MOC: ***  5. History of gestational hypertension BP elevated in MAU on 2/18 but normal in the office on 2/22. Today BP is ***.  Continue to monitor closely.   6.21Multigravida of advanced maternal age in first trimester  7. History of preterm delivery, currently pregnant Prior delivery at 36w.   8. Low grade squamous intraepithelial lesion on cytologic smear of cervix (LGSIL) Colposcopy following delivery  9. Previous cesarean delivery affecting pregnancy Desires RLTCS. Not yet scheduled - based on MFM discussion at [place] rounds, will schedule for 37w but if concern for cardiac status may need to be sooner. ***  Preterm labor symptoms and general obstetric precautions including but not limited to vaginal bleeding, contractions, leaking of fluid and fetal movement were reviewed in detail with the patient. Please refer to After Visit Summary for other counseling recommendations.   No follow-ups on file.  Future Appointments  Date Time Provider De[place]2/29/2024  9:30 AM DuRadene GunningMD [facility]/06/2022  9:15 AM [facility] NURSE [facility]/06[facility]2022  9:30 AM [facility] US[facility]/01/2023  1:20 PM PeFreada BergeronMD [facility] None    PaNevin Bloodgood  Damita Dunnings, MD

## 2022-08-16 ENCOUNTER — Encounter: Payer: Self-pay | Admitting: Obstetrics and Gynecology

## 2022-08-16 ENCOUNTER — Ambulatory Visit (INDEPENDENT_AMBULATORY_CARE_PROVIDER_SITE_OTHER): Payer: Medicaid Other | Admitting: Obstetrics and Gynecology

## 2022-08-16 VITALS — BP 122/75 | HR 98 | Wt 146.0 lb

## 2022-08-16 DIAGNOSIS — Z3A32 32 weeks gestation of pregnancy: Secondary | ICD-10-CM

## 2022-08-16 DIAGNOSIS — O99413 Diseases of the circulatory system complicating pregnancy, third trimester: Secondary | ICD-10-CM

## 2022-08-16 DIAGNOSIS — O09523 Supervision of elderly multigravida, third trimester: Secondary | ICD-10-CM

## 2022-08-16 DIAGNOSIS — I340 Nonrheumatic mitral (valve) insufficiency: Secondary | ICD-10-CM

## 2022-08-16 DIAGNOSIS — O09521 Supervision of elderly multigravida, first trimester: Secondary | ICD-10-CM

## 2022-08-16 DIAGNOSIS — O0990 Supervision of high risk pregnancy, unspecified, unspecified trimester: Secondary | ICD-10-CM

## 2022-08-16 DIAGNOSIS — Z8759 Personal history of other complications of pregnancy, childbirth and the puerperium: Secondary | ICD-10-CM

## 2022-08-16 DIAGNOSIS — R87612 Low grade squamous intraepithelial lesion on cytologic smear of cervix (LGSIL): Secondary | ICD-10-CM

## 2022-08-16 DIAGNOSIS — I5020 Unspecified systolic (congestive) heart failure: Secondary | ICD-10-CM

## 2022-08-16 DIAGNOSIS — O2303 Infections of kidney in pregnancy, third trimester: Secondary | ICD-10-CM

## 2022-08-16 DIAGNOSIS — O09899 Supervision of other high risk pregnancies, unspecified trimester: Secondary | ICD-10-CM

## 2022-08-16 DIAGNOSIS — O2302 Infections of kidney in pregnancy, second trimester: Secondary | ICD-10-CM

## 2022-08-16 DIAGNOSIS — O34219 Maternal care for unspecified type scar from previous cesarean delivery: Secondary | ICD-10-CM

## 2022-08-16 DIAGNOSIS — O09893 Supervision of other high risk pregnancies, third trimester: Secondary | ICD-10-CM

## 2022-08-16 MED ORDER — OMEPRAZOLE MAGNESIUM 20 MG PO TBEC: 20.0000 mg | DELAYED_RELEASE_TABLET | Freq: Every day | ORAL | 3 refills | Status: DC

## 2022-08-17 ENCOUNTER — Other Ambulatory Visit: Payer: Self-pay | Admitting: *Deleted

## 2022-08-17 ENCOUNTER — Ambulatory Visit: Payer: Medicaid Other | Attending: Maternal & Fetal Medicine

## 2022-08-17 ENCOUNTER — Ambulatory Visit: Payer: Medicaid Other | Admitting: *Deleted

## 2022-08-17 ENCOUNTER — Other Ambulatory Visit: Payer: Self-pay | Admitting: Obstetrics and Gynecology

## 2022-08-17 ENCOUNTER — Encounter: Payer: Self-pay | Admitting: *Deleted

## 2022-08-17 VITALS — BP 122/79 | HR 98

## 2022-08-17 DIAGNOSIS — O34219 Maternal care for unspecified type scar from previous cesarean delivery: Secondary | ICD-10-CM | POA: Insufficient documentation

## 2022-08-17 DIAGNOSIS — O0990 Supervision of high risk pregnancy, unspecified, unspecified trimester: Secondary | ICD-10-CM

## 2022-08-17 DIAGNOSIS — O09299 Supervision of pregnancy with other poor reproductive or obstetric history, unspecified trimester: Secondary | ICD-10-CM

## 2022-08-17 DIAGNOSIS — D582 Other hemoglobinopathies: Secondary | ICD-10-CM

## 2022-08-17 DIAGNOSIS — O09522 Supervision of elderly multigravida, second trimester: Secondary | ICD-10-CM | POA: Diagnosis present

## 2022-08-17 DIAGNOSIS — Z8759 Personal history of other complications of pregnancy, childbirth and the puerperium: Secondary | ICD-10-CM

## 2022-08-17 DIAGNOSIS — O09213 Supervision of pregnancy with history of pre-term labor, third trimester: Secondary | ICD-10-CM

## 2022-08-17 DIAGNOSIS — O09523 Supervision of elderly multigravida, third trimester: Secondary | ICD-10-CM

## 2022-08-17 DIAGNOSIS — O09293 Supervision of pregnancy with other poor reproductive or obstetric history, third trimester: Secondary | ICD-10-CM

## 2022-08-17 DIAGNOSIS — Z3A32 32 weeks gestation of pregnancy: Secondary | ICD-10-CM

## 2022-08-17 DIAGNOSIS — O99413 Diseases of the circulatory system complicating pregnancy, third trimester: Secondary | ICD-10-CM

## 2022-08-17 DIAGNOSIS — I340 Nonrheumatic mitral (valve) insufficiency: Secondary | ICD-10-CM

## 2022-08-17 HISTORY — DX: Personal history of other complications of pregnancy, childbirth and the puerperium: Z87.59

## 2022-08-22 NOTE — Progress Notes (Signed)
Cardio-Obstetrics Clinic  Follow Up Note   Date:  08/24/2022   ID:  [name], DOB 1986/04/09, MRN [id number]  PCP:  Patient, No Pcp Per   [place] Providers Cardiologist:  Freada Bergeron, MD  Electrophysiologist:  None       Referring MD: No ref. provider found   Chief Complaint: Moderate to severe MR  History of Present Illness:    [name] is a 37 y.o. female [[id number]] who returns for follow up of moderate to severe MR.  Patient was hospitalized in 04/2022 for pyelonephritis.  On admission, she was noted to by tachycardic and ECG was performed which revealed sinus tachycardia with borderline LVH and inferolateral TWI. Cardiology was called overnight and TTE was recommended which revealed  LVEF 40% with apical lateral hypokinesis, normal RV, mildly thickened and calcified mitral valve leaflet (abnormal for age) but no definitive post-inflammatory changes with moderate to severe MR. Trops negative x2. BNP 109. She appeared euvolemic on exam with no chest pain or SOB. We started metoprolol 12.'5mg'$  XL daily.  Notably, had pre-eclampsia with prior pregnancies at time of delivery. Resolved with delivery and did not require BP meds post-pregnancy.  Was seen in clinic on 05/07/22 where she was doing well from a CV standpoint. No HF symptoms. TTE 05/22/22 stable with LVEF 40-45%, normal RV, moderate to severe MR.   Cardiac monitor 07/2022 with NSR, rare ectopy, 1 six beat run of NSVT. She has been continued on metoprolol.  TTE 07/2022 with stable EF 45-50%, moderate MR, normal RV  Today, the patient is currently 75w3dpregnant. Having some fatigue and mild dyspnea on exertion, but this is stable and not progressing. No chest pain, orthopnea, PND, or LE edema. Palpitations are improved on the metoprolol. Blood pressure is well controlled. Planned for c-section in 09/18/22.   Prior CV Studies Reviewed: The following studies were reviewed today: TTE  07/2022: IMPRESSIONS     1. Mild global reduction in LV function; moderate MR (may be  underestimated; 2 jets noted; suggest TEE to further assess).   2. Left ventricular ejection fraction, by estimation, is 45 to 50%. The  left ventricle has mildly decreased function. The left ventricle  demonstrates global hypokinesis. Left ventricular diastolic parameters  were normal. The average left ventricular  global longitudinal strain is -19.9 %. The global longitudinal strain is  normal.   3. Right ventricular systolic function is normal. The right ventricular  size is normal.   4. The mitral valve is normal in structure. Moderate mitral valve  regurgitation. No evidence of mitral stenosis.   5. The aortic valve is tricuspid. Aortic valve regurgitation is not  visualized. No aortic stenosis is present.   6. The inferior vena cava is normal in size with greater than 50%  respiratory variability, suggesting right atrial pressure of 3 mmHg.   Cardiac Monitor 07/2022:   Patch wear time was 2 days and 18 hours   Predominant rhythm is NSR with HR 86   There was 1 run of nonsustained VT lasting 6 beats   Rare ectopy (<1% SVE and VE)   Patient symptoms correlated with NSR   No sustained arrhythmias or significant pauses     Patch Wear Time:  2 days and 18 hours (2024-02-05T23:45:56-0500 to 2024-02-08T18:00:45-0500)   Patient had a min HR of 58 bpm, max HR of 179 bpm, and avg HR of 86 bpm. Predominant underlying rhythm was Sinus Rhythm. 1 run of Ventricular Tachycardia occurred lasting 6 beats  with a max rate of 179 bpm (avg 167 bpm). Isolated SVEs were rare (<1.0%),  SVE Couplets were rare (<1.0%), and no SVE Triplets were present. Isolated VEs were rare (<1.0%), VE Couplets were rare (<1.0%), and no VE Triplets were present.  TTE 05/22/22: IMPRESSIONS     1. Left ventricular ejection fraction, by estimation, is 40 to 45%. Left  ventricular ejection fraction by 3D volume is 45 %. The left  ventricle has  mildly decreased function. The left ventricle demonstrates global  hypokinesis. The left ventricular  internal cavity size was mildly dilated. Left ventricular diastolic  parameters were normal.   2. Right ventricular systolic function is normal. The right ventricular  size is normal. Tricuspid regurgitation signal is inadequate for assessing  PA pressure.   3. The mitral valve is normal in structure. Moderate to severe mitral  valve regurgitation.   4. The aortic valve is tricuspid. Aortic valve regurgitation is not  visualized. No aortic stenosis is present.   5. The inferior vena cava is normal in size with greater than 50%  respiratory variability, suggesting right atrial pressure of 3 mmHg.   Comparison(s): No significant change from prior study. Prior images  reviewed side by side.   Conclusion(s)/Recommendation(s): Consider TEE to clarify mechanism and  severity of mitral insufficiency.    TTE 04/22/22: IMPRESSIONS     1. Left ventricular ejection fraction, by estimation, is 40%. Left  ventricular ejection fraction by 3D volume is 40 %. The left ventricle has  mild to moderately decreased function. The left ventricle demonstrates  global hypokinesis and regional wall  motion abnormalities (see scoring diagram/findings for description). The  left ventricular internal cavity size was mildly dilated. Left ventricular  diastolic parameters were normal.   2. Right ventricular systolic function is normal. The right ventricular  size is normal. Tricuspid regurgitation signal is inadequate for assessing  PA pressure.   3. The mitral valve is grossly normal. Moderate to severe mitral valve  regurgitation. The mitral valve is mildly thickened for age, but no  definite post inflammatory change unless clinical relevance. Suspect  mechanism is secondary MR due to LV  dysfunction. No evidence of mitral stenosis.   4. The aortic valve was not well visualized. Aortic  valve regurgitation  is not visualized. No aortic stenosis is present.   5. The inferior vena cava is normal in size with greater than 50%  respiratory variability, suggesting right atrial pressure of 3 mmHg.     Past Medical History:  Diagnosis Date   Bacterial infection    History of chicken pox    Systolic heart failure [facility])     Past Surgical History:  Procedure Laterality Date   CESAREAN SECTION     CESAREAN SECTION N/A 02/02/2015   Procedure: CESAREAN SECTION;  Surgeon: Thurnell Lose, MD;  Location: [place] [facility];  Service: Obstetrics;  Laterality: N/A;   DILATION AND CURETTAGE OF UTERUS     WISDOM TOOTH EXTRACTION        OB History     Gravida  5   Para  2   Term  1   Preterm  1   AB  2   Living  2      SAB  1   IAB  1   Ectopic      Multiple  0   Live Births  2               Current Medications: Current Meds  Medication Sig  aspirin (ASPIRIN CHILDRENS) 81 MG chewable tablet Chew 1 tablet (81 mg total) by mouth daily.   metoprolol succinate (TOPROL XL) 25 MG 24 hr tablet Take 0.5 tablets (12.5 mg total) by mouth daily.   omeprazole (PRILOSEC OTC) 20 MG tablet Take 1 tablet (20 mg total) by mouth daily.   Prenatal MV & Min w/FA-DHA ([employer] PRENATAL GUMMY) 0.18-25 MG CHEW Chew 1 tablet by mouth daily.     Allergies:   Other and Penicillins   Social History   Socioeconomic History   Marital status: Single    Spouse name: Not on file   Number of children: Not on file   Years of education: Not on file   Highest education level: Not on file  Occupational History   Not on file  Tobacco Use   Smoking status: Never   Smokeless tobacco: Never  Vaping Use   Vaping Use: Never used  Substance and Sexual Activity   Alcohol use: No   Drug use: No   Sexual activity: Not Currently    Birth control/protection: None  Other Topics Concern   Not on file  Social History Narrative   Not on file   Social Determinants of Health   Financial Resource  Strain: Not on file  Food Insecurity: No Food Insecurity (04/22/2022)   Hunger Vital Sign    Worried About Running Out of Food in the Last Year: Never true    Ran Out of Food in the Last Year: Never true  Transportation Needs: No Transportation Needs (04/22/2022)   PRAPARE - Hydrologist (Medical): No    Lack of Transportation (Non-Medical): No  Physical Activity: Not on file  Stress: Not on file  Social Connections: Not on file      Family History  Problem Relation Age of Onset   Hypertension Mother    Hypertension Father    Diabetes Father    Hypertension Paternal Aunt    Asthma Neg Hx    Cancer Neg Hx    Heart disease Neg Hx    Stroke Neg Hx       ROS:   Please see the history of present illness.    Review of Systems  Constitutional:  Negative for chills and fever.  HENT:  Negative for nosebleeds and tinnitus.   Eyes:  Negative for blurred vision.  Respiratory:  Positive for shortness of breath.   Cardiovascular:  Positive for palpitations. Negative for chest pain, orthopnea, claudication, leg swelling and PND.  Gastrointestinal:  Negative for blood in stool, melena, nausea and vomiting.  Genitourinary:  Negative for dysuria and hematuria.  Musculoskeletal:  Negative for falls.  Neurological:  Negative for dizziness, loss of consciousness and headaches.    All other systems reviewed and are negative.   Labs/EKG Reviewed:    EKG:  No new tracing   Recent Labs: 04/22/2022: B Natriuretic Peptide 109.9 07/16/2022: Magnesium 1.8 08/06/2022: ALT 40; BUN 5; Creatinine, Ser 0.46; Hemoglobin 10.5; Platelets 304; Potassium 3.5; Sodium 134   Recent Lipid Panel No results found for: "CHOL", "TRIG", "HDL", "CHOLHDL", "LDLCALC", "LDLDIRECT"  Physical Exam:    VS:  BP 128/80   Pulse 89   Ht '5\' 4"'$  (1.626 m)   Wt 146 lb 3.2 oz (66.3 kg)   LMP 01/02/2022   SpO2 99%   BMI 25.10 kg/m     Wt Readings from Last 3 Encounters:  08/24/22 146 lb  3.2 oz (66.3 kg)  08/16/22 146 lb (66.2  kg)  08/09/22 144 lb (65.3 kg)     GEN:  Comfortable, NAD HEENT: Normal NECK: No JVD; No carotid bruits CARDIAC: RRR, 3/6 systolic murmur heard throughout the precordium. No rubs or gallopse RESPIRATORY:  Clear bilaterally ABDOMEN: Gravid, nontender to palpation MUSCULOSKELETAL:  No edema, wamr SKIN: Warm and dry NEUROLOGIC:  Alert and oriented x 3 PSYCHIATRIC:  Normal affect    Risk Assessment/Risk Calculators:              ASSESSMENT & PLAN:    #Systolic HF with mildly reduced EF: Patient admitted in 04/2022 for pyelonephritis with ECG revealing sinus tachycardia, possible LVH and inferior TWI. This abnormal ECG prompted TTE which revealed LVEF 40% with apical lateral hypokinesis, moderate-to-severe MR with mildly thickened valve for age. Unknown etiology as patient denies any personal or family history of CV disease, no known serious illnesses as a child or adult, and no history of hospitalization for volume overload/chest pain. States she had pre-eclampsia during first pregnancy at time of delivery but HTN resolved post-partum and she never required lasix. She reports chronic dyspnea on exertion that is worse with current pregnancy but no significant chest pain, palpitations, LE edema or orthopnea. In the hospital, trop negative x2 and BNP 109 with no evidence of volume overload. Managing medically throughout pregnancy with close monitoring of echoes and regular follow-up. Once she is no longer pregnant, will proceed with more in-depth work-up including TEE/CMR and possible coronary CTA. -Discussed she is WHO class III with pregnancy, CARPREG score 5 corresponding to 40% chance of CV complications during pregnancy -Currently euvolemic and compensated -Continue metoprolol 12.'5mg'$  XL daily  -Continue serial monitoring of TTE with next 08/2022 prior to delivery -Once she is no longer pregnant, we can pursue further work-up with CMR/TEE for her  valve and likely coronary CTA to assess coronary anatomy   #Moderate-to-severe MR: MV appears more thickened than would be expected for age (? Postinflammatory). Denies any history of rheumatic fever, IVDU, blood stream infections, severe childhood illness or family history of disease. Likely this has been chronic and ongoing and patient is compensated and relatively asymptomatic. Will manage HF as above. High risk of volume overload in pregnancy and will need to monitor her fluid status closely.  Once no longer pregnant, will pursue further work-up.  -Continue metop 12.'5mg'$  XL daily  -Continue serial monitoring with echoes as above -Will need to watch volume status closely with pregnancy especially at delivery -Will pursue further work-up once patient is no longer pregnant as detailed above  #Palpitations: Improved. Zio monitor with rare ectopy, 1 six beat run of NSVT. -Continue metop 12.'5mg'$  XL   #Dizziness: Improved. Given that she is now having more frequent palps, will trial back on metop and monitor response.  #History of Pre-Eclampsia: #High Risk Pregnancy: -Continue ASA '81mg'$  daily -BP controlled -Management per OBGYN -Followed by Cardio OB team   There are no Patient Instructions on file for this visit.     Follow-up: 3 weeks  Medication Adjustments/Labs and Tests Ordered: Current medicines are reviewed at length with the patient today.  Concerns regarding medicines are outlined above.  Tests Ordered: Orders Placed This Encounter  Procedures   ECHOCARDIOGRAM COMPLETE   Medication Changes: No orders of the defined types were placed in this encounter.    Gwyndolyn Kaufman, MD

## 2022-08-24 ENCOUNTER — Encounter: Payer: Self-pay | Admitting: Cardiology

## 2022-08-24 ENCOUNTER — Ambulatory Visit (INDEPENDENT_AMBULATORY_CARE_PROVIDER_SITE_OTHER): Payer: Medicaid Other | Admitting: Cardiology

## 2022-08-24 ENCOUNTER — Encounter: Payer: Self-pay | Admitting: *Deleted

## 2022-08-24 VITALS — BP 128/80 | HR 89 | Ht 64.0 in | Wt 146.2 lb

## 2022-08-24 DIAGNOSIS — I340 Nonrheumatic mitral (valve) insufficiency: Secondary | ICD-10-CM

## 2022-08-24 DIAGNOSIS — R002 Palpitations: Secondary | ICD-10-CM

## 2022-08-24 DIAGNOSIS — O0992 Supervision of high risk pregnancy, unspecified, second trimester: Secondary | ICD-10-CM | POA: Diagnosis not present

## 2022-08-24 DIAGNOSIS — Z3A33 33 weeks gestation of pregnancy: Secondary | ICD-10-CM

## 2022-08-24 DIAGNOSIS — I5022 Chronic systolic (congestive) heart failure: Secondary | ICD-10-CM | POA: Diagnosis not present

## 2022-08-24 NOTE — Patient Instructions (Signed)
Medication Instructions:   Your physician recommends that you continue on your current medications as directed. Please refer to the Current Medication list given to you today.  *If you need a refill on your cardiac medications before your next appointment, please call your pharmacy*   Testing/Procedures:  Your physician has requested that you have an OB echocardiogram. Echocardiography is a painless test that uses sound waves to create images of your heart. It provides your doctor with information about the size and shape of your heart and how well your heart's chambers and valves are working. This procedure takes approximately one hour. There are no restrictions for this procedure.  OB ECHO TO BE DONE IN THE NEXT 2 WEEKS DUE TO PATIENT DELIVERING IN EARLY APRIL PER DR. Johney Frame  Cardiac-OB patient: to be performed by Lorriane Shire or Romelle Starcher.   Please do NOT wear cologne, perfume, aftershave, or lotions (deodorant is allowed). Please arrive 15 minutes prior to your appointment time.    Follow-Up:  ON 09/14/22 WITH DR. Johney Frame AT THE QUARTER DAY END SLOT

## 2022-08-29 NOTE — Progress Notes (Unsigned)
PRENATAL VISIT NOTE  Subjective:  [name] is a 37 y.o. [id number] at 45w2dbeing seen today for ongoing prenatal care.  She is currently monitored for the following issues for this high-risk pregnancy and has Supervision of high risk pregnancy, antepartum; History of gestational hypertension; Multigravida of advanced maternal age in first trimester; History of preterm delivery, currently pregnant; UTI in pregnancy; Abnormal Pap smear of cervix; Pyelonephritis affecting pregnancy in second trimester; Systolic heart failure ([place]; Maternal mitral valve regurgitation affecting pregnancy in second trimester, antepartum; Previous cesarean delivery affecting pregnancy; RED CHART ROUNDS; and Anemia on their problem list.  Patient reports {[id number]}.   .  .   . Denies leaking of fluid.   The following portions of the patient's history were reviewed and updated as appropriate: allergies, current medications, past family history, past medical history, past social history, past surgical history and problem list.   Objective:  There were no vitals filed for this visit.  Fetal Status:           General:  Alert, oriented and cooperative. Patient is in no acute distress.  Skin: Skin is warm and dry. No rash noted.   Cardiovascular: Normal heart rate noted  Respiratory: Normal respiratory effort, no problems with respiration noted  Abdomen: Soft, gravid, appropriate for gestational age.        Pelvic: Cervical exam deferred        Extremities: Normal range of motion.     Mental Status: Normal mood and affect. Normal behavior. Normal judgment and thought content.   Assessment and Plan:  Pregnancy: [id number] 347w1d. Previous cesarean delivery affecting pregnancy Scheduled repeat for 4/2 in AM as planned with anesthesia and remainder of the team. Scheduled for 9:30.  She does have a history of bladder injury in one of her c-sections  2. Anemia, unspecified type HgB was 10.5 - discussed PO iron  - ***  3. Low grade squamous intraepithelial lesion on cytologic smear of cervix (LGSIL) Colpo after delivery   4. History of preterm delivery, currently pregnant One prior delivery at 3620w0d. Multigravida of advanced maternal age in first trimester   6. History of gestational hypertension BP was elevated once in MAU on 2/18 but normal since. BP today is *** Continue ldASA until delivery.  Growth has been normal thus far - next due on 4/1. Prior was on 3/1 and was 31%ile with normal AC and AFI.   7. Supervision of high risk pregnancy, antepartum Discussed MOC again - pt ***  8. Pyelonephritis affecting pregnancy in second trimester Continue suppression before delivery  9. Maternal mitral valve regurgitation affecting pregnancy in second trimester, antepartum Remains compensated and asymptomatic on Toprol.   10. Systolic heart failure, unspecified HF chronicity ([place]CC[place]ollowing with Dr. PemJohney Frameast seen 3/8)- next Echo is scheduled for 08/31/2022 Continue Toprol.  Plan is for further eval once no longer pregnant.   Preterm labor symptoms and general obstetric precautions including but not limited to vaginal bleeding, contractions, leaking of fluid and fetal movement were reviewed in detail with the patient. Please refer to After Visit Summary for other counseling recommendations.   No follow-ups on file.  Future Appointments  Date Time Provider [place]/14/2024 10:50 AM DunRadene GunningD [facility]/15/2024  7:35 AM [facility]  09/13/2022 11:10 AM ForInez CatalinaD [facility]/29/2024  9:40 AM PemFreada BergeronD [facility]  09/17/2022  7:15 AM [facility]  NURSE [facility]  09/17/2022  7:30 AM [facility] US3 [facility]    Radene Gunning, MD

## 2022-08-30 ENCOUNTER — Ambulatory Visit (INDEPENDENT_AMBULATORY_CARE_PROVIDER_SITE_OTHER): Payer: Medicaid Other | Admitting: Obstetrics and Gynecology

## 2022-08-30 VITALS — BP 136/81 | HR 98 | Wt 150.0 lb

## 2022-08-30 DIAGNOSIS — O09521 Supervision of elderly multigravida, first trimester: Secondary | ICD-10-CM

## 2022-08-30 DIAGNOSIS — D649 Anemia, unspecified: Secondary | ICD-10-CM

## 2022-08-30 DIAGNOSIS — O09893 Supervision of other high risk pregnancies, third trimester: Secondary | ICD-10-CM

## 2022-08-30 DIAGNOSIS — I340 Nonrheumatic mitral (valve) insufficiency: Secondary | ICD-10-CM

## 2022-08-30 DIAGNOSIS — O2302 Infections of kidney in pregnancy, second trimester: Secondary | ICD-10-CM

## 2022-08-30 DIAGNOSIS — O09523 Supervision of elderly multigravida, third trimester: Secondary | ICD-10-CM

## 2022-08-30 DIAGNOSIS — R87612 Low grade squamous intraepithelial lesion on cytologic smear of cervix (LGSIL): Secondary | ICD-10-CM

## 2022-08-30 DIAGNOSIS — I5020 Unspecified systolic (congestive) heart failure: Secondary | ICD-10-CM

## 2022-08-30 DIAGNOSIS — O0990 Supervision of high risk pregnancy, unspecified, unspecified trimester: Secondary | ICD-10-CM

## 2022-08-30 DIAGNOSIS — Z8759 Personal history of other complications of pregnancy, childbirth and the puerperium: Secondary | ICD-10-CM

## 2022-08-30 DIAGNOSIS — Z3A34 34 weeks gestation of pregnancy: Secondary | ICD-10-CM

## 2022-08-30 DIAGNOSIS — O2303 Infections of kidney in pregnancy, third trimester: Secondary | ICD-10-CM

## 2022-08-30 DIAGNOSIS — O34219 Maternal care for unspecified type scar from previous cesarean delivery: Secondary | ICD-10-CM

## 2022-08-30 DIAGNOSIS — O99413 Diseases of the circulatory system complicating pregnancy, third trimester: Secondary | ICD-10-CM

## 2022-08-30 DIAGNOSIS — O09899 Supervision of other high risk pregnancies, unspecified trimester: Secondary | ICD-10-CM

## 2022-08-30 MED ORDER — FERROUS SULFATE 325 (65 FE) MG PO TBEC: 325.0000 mg | DELAYED_RELEASE_TABLET | ORAL | 1 refills | Status: DC

## 2022-08-31 ENCOUNTER — Ambulatory Visit (HOSPITAL_COMMUNITY): Payer: Medicaid Other | Attending: Cardiology

## 2022-08-31 DIAGNOSIS — I340 Nonrheumatic mitral (valve) insufficiency: Secondary | ICD-10-CM | POA: Insufficient documentation

## 2022-08-31 DIAGNOSIS — I5022 Chronic systolic (congestive) heart failure: Secondary | ICD-10-CM | POA: Diagnosis not present

## 2022-08-31 DIAGNOSIS — O0992 Supervision of high risk pregnancy, unspecified, second trimester: Secondary | ICD-10-CM | POA: Diagnosis not present

## 2022-08-31 DIAGNOSIS — Z3A00 Weeks of gestation of pregnancy not specified: Secondary | ICD-10-CM | POA: Diagnosis not present

## 2022-08-31 DIAGNOSIS — I517 Cardiomegaly: Secondary | ICD-10-CM

## 2022-08-31 DIAGNOSIS — R002 Palpitations: Secondary | ICD-10-CM | POA: Insufficient documentation

## 2022-08-31 LAB — ECHOCARDIOGRAM COMPLETE
Area-P 1/2: 4.26 cm2
MV M vel: 5.46 m/s
MV Peak grad: 119.2 mmHg
Radius: 1 cm
S' Lateral: 4.5 cm

## 2022-09-04 ENCOUNTER — Other Ambulatory Visit: Payer: Self-pay

## 2022-09-04 ENCOUNTER — Encounter (HOSPITAL_COMMUNITY): Payer: Self-pay | Admitting: Family Medicine

## 2022-09-04 ENCOUNTER — Inpatient Hospital Stay (HOSPITAL_COMMUNITY)
Admission: AD | Admit: 2022-09-04 | Discharge: 2022-09-04 | Disposition: A | Discharge status: Home / Self Care | Payer: Medicaid Other | Attending: Family Medicine | Admitting: Family Medicine

## 2022-09-04 DIAGNOSIS — I5020 Unspecified systolic (congestive) heart failure: Secondary | ICD-10-CM | POA: Diagnosis not present

## 2022-09-04 DIAGNOSIS — D649 Anemia, unspecified: Secondary | ICD-10-CM

## 2022-09-04 DIAGNOSIS — O09293 Supervision of pregnancy with other poor reproductive or obstetric history, third trimester: Secondary | ICD-10-CM | POA: Diagnosis not present

## 2022-09-04 DIAGNOSIS — R030 Elevated blood-pressure reading, without diagnosis of hypertension: Secondary | ICD-10-CM

## 2022-09-04 DIAGNOSIS — R102 Pelvic and perineal pain: Secondary | ICD-10-CM | POA: Diagnosis not present

## 2022-09-04 DIAGNOSIS — O163 Unspecified maternal hypertension, third trimester: Secondary | ICD-10-CM | POA: Diagnosis not present

## 2022-09-04 DIAGNOSIS — O26899 Other specified pregnancy related conditions, unspecified trimester: Secondary | ICD-10-CM

## 2022-09-04 DIAGNOSIS — O34219 Maternal care for unspecified type scar from previous cesarean delivery: Secondary | ICD-10-CM | POA: Diagnosis not present

## 2022-09-04 DIAGNOSIS — O26893 Other specified pregnancy related conditions, third trimester: Secondary | ICD-10-CM | POA: Diagnosis not present

## 2022-09-04 DIAGNOSIS — Z3689 Encounter for other specified antenatal screening: Secondary | ICD-10-CM | POA: Diagnosis not present

## 2022-09-04 DIAGNOSIS — O4703 False labor before 37 completed weeks of gestation, third trimester: Secondary | ICD-10-CM | POA: Insufficient documentation

## 2022-09-04 DIAGNOSIS — I340 Nonrheumatic mitral (valve) insufficiency: Secondary | ICD-10-CM | POA: Diagnosis not present

## 2022-09-04 DIAGNOSIS — R109 Unspecified abdominal pain: Secondary | ICD-10-CM | POA: Diagnosis not present

## 2022-09-04 DIAGNOSIS — O99013 Anemia complicating pregnancy, third trimester: Secondary | ICD-10-CM | POA: Diagnosis not present

## 2022-09-04 DIAGNOSIS — Z3A35 35 weeks gestation of pregnancy: Secondary | ICD-10-CM | POA: Diagnosis not present

## 2022-09-04 DIAGNOSIS — O479 False labor, unspecified: Secondary | ICD-10-CM

## 2022-09-04 LAB — URINALYSIS, ROUTINE W REFLEX MICROSCOPIC
Bilirubin Urine: NEGATIVE
Glucose, UA: NEGATIVE mg/dL
Hgb urine dipstick: NEGATIVE
Ketones, ur: NEGATIVE mg/dL
Leukocytes,Ua: NEGATIVE
Nitrite: NEGATIVE
Protein, ur: NEGATIVE mg/dL
Specific Gravity, Urine: 1.004 — ABNORMAL LOW (ref 1.005–1.030)
pH: 7 (ref 5.0–8.0)

## 2022-09-04 LAB — COMPREHENSIVE METABOLIC PANEL
ALT: 44 U/L (ref 0–44)
AST: 56 U/L — ABNORMAL HIGH (ref 15–41)
Albumin: 2.9 g/dL — ABNORMAL LOW (ref 3.5–5.0)
Alkaline Phosphatase: 93 U/L (ref 38–126)
Anion gap: 9 (ref 5–15)
BUN: 9 mg/dL (ref 6–20)
CO2: 20 mmol/L — ABNORMAL LOW (ref 22–32)
Calcium: 9 mg/dL (ref 8.9–10.3)
Chloride: 106 mmol/L (ref 98–111)
Creatinine, Ser: 0.54 mg/dL (ref 0.44–1.00)
GFR, Estimated: >60 mL/min (ref >60)
Glucose, Bld: 77 mg/dL (ref 70–99)
Potassium: 4.1 mmol/L (ref 3.5–5.1)
Sodium: 135 mmol/L (ref 135–145)
Total Bilirubin: 0.5 mg/dL (ref 0.3–1.2)
Total Protein: 6.5 g/dL (ref 6.5–8.1)

## 2022-09-04 LAB — CBC
HCT: 32.7 % — ABNORMAL LOW (ref 36.0–46.0)
Hemoglobin: 11 g/dL — ABNORMAL LOW (ref 12.0–15.0)
MCH: 25.4 pg — ABNORMAL LOW (ref 26.0–34.0)
MCHC: 33.6 g/dL (ref 30.0–36.0)
MCV: 75.5 fL — ABNORMAL LOW (ref 80.0–100.0)
Platelets: 320 10*3/uL (ref 150–400)
RBC: 4.33 MIL/uL (ref 3.87–5.11)
RDW: 13.9 % (ref 11.5–15.5)
WBC: 8.1 10*3/uL (ref 4.0–10.5)
nRBC: 0 % (ref 0.0–0.2)

## 2022-09-04 LAB — PROTEIN / CREATININE RATIO, URINE
Creatinine, Urine: 19 mg/dL
Total Protein, Urine: <6 mg/dL

## 2022-09-04 LAB — BRAIN NATRIURETIC PEPTIDE: B Natriuretic Peptide: 87.5 pg/mL (ref 0.0–100.0)

## 2022-09-04 NOTE — MAU Provider Note (Signed)
History     CSN: [id number]  Arrival date and time: 09/04/22 1038   Event Date/Time   First Provider Initiated Contact with Patient 09/04/22 1133      Chief Complaint  Patient presents with   Abdominal Pain   Abdominal Pain This is a new problem. The current episode started today. The onset quality is sudden. The problem occurs intermittently. The most recent episode lasted 4 hours. The pain is located in the suprapubic region. The pain is at a severity of 2/10. The pain is mild. The quality of the pain is cramping. The abdominal pain does not radiate. Pertinent negatives include no diarrhea, dysuria, fever, nausea or vomiting. Nothing aggravates the pain. She has tried nothing for the symptoms. Her past medical history is significant for abdominal surgery (Prior CS x 2).   Patient is 37 y.o. [id number] [redacted]w[redacted]d here with complaints of cramping in her lower abdomen. Reports pain score of 7 pf 10 when she has the pain. It is intermittent.  No pain currently but had some in the lobby.  She reports onset this morning. She was unsure if they were contractions. She has a scheduled CS on 4/2  Pregnancy complicated by HFrEF [id number] and moderate to severe mitral regurgitation. Denies changes in her SOB/DOE. Denies CP  Has a history of PEC and DBP is slightly elevated today. Denies HA, blurry vision/changes in vision, RUQ pain, swelling.   +FM, denies LOF, VB, contractions, vaginal discharge.   OB History     Gravida  5   Para  2   Term  1   Preterm  1   AB  2   Living  2      SAB  1   IAB  1   Ectopic      Multiple  0   Live Births  2           Past Medical History:  Diagnosis Date   Bacterial infection    History of chicken pox    Systolic heart failure ([place])     Past Surgical History:  Procedure Laterality Date   CESAREAN SECTION     CESAREAN SECTION N/A 02/02/2015   Procedure: CESAREAN SECTION;  Surgeon: Thurnell Lose, MD;  Location: [facility];  Service:  Obstetrics;  Laterality: N/A;   DILATION AND CURETTAGE OF UTERUS     WISDOM TOOTH EXTRACTION      Family History  Problem Relation Age of Onset   Hypertension Mother    Hypertension Father    Diabetes Father    Hypertension Paternal Aunt    Asthma Neg Hx    Cancer Neg Hx    Heart disease Neg Hx    Stroke Neg Hx     Social History   Tobacco Use   Smoking status: Never   Smokeless tobacco: Never  Vaping Use   Vaping Use: Never used  Substance Use Topics   Alcohol use: No   Drug use: No    Allergies:  Allergies  Allergen Reactions   Other Other (See Comments)   Penicillins Rash    Medications Prior to Admission  Medication Sig Dispense Refill Last Dose   metoprolol succinate (TOPROL XL) 25 MG 24 hr tablet Take 0.5 tablets (12.5 mg total) by mouth daily. 45 tablet 3 09/03/2022   Prenatal MV & Min w/FA-DHA ([facility] PRENATAL GUMMY) 0.18-25 MG CHEW Chew 1 tablet by mouth daily. 30 tablet 3 09/03/2022   aspirin (ASPIRIN CHILDRENS) 81 MG chewable tablet Chew  1 tablet (81 mg total) by mouth daily. 90 tablet 1 09/02/2022   ferrous sulfate 325 (65 FE) MG EC tablet Take 1 tablet (325 mg total) by mouth every other day. 60 tablet 1    omeprazole (PRILOSEC OTC) 20 MG tablet Take 1 tablet (20 mg total) by mouth daily. 30 tablet 3     Review of Systems  Constitutional:  Negative for chills and fever.  HENT:  Negative for congestion and sore throat.   Eyes:  Negative for pain and visual disturbance.  Respiratory:  Negative for cough, chest tightness and shortness of breath.   Cardiovascular:  Negative for chest pain.  Gastrointestinal:  Positive for abdominal pain. Negative for diarrhea, nausea and vomiting.  Endocrine: Negative for cold intolerance and heat intolerance.  Genitourinary:  Negative for dysuria and flank pain.  Musculoskeletal:  Negative for back pain.  Skin:  Negative for rash.  Allergic/Immunologic: Negative for food allergies.  Neurological:  Negative for dizziness  and light-headedness.  Psychiatric/Behavioral:  Negative for agitation.    Physical Exam   Blood pressure 136/88, pulse 76, temperature 98.2 F (36.8 C), temperature source Oral, resp. rate 18, height 5\' 4"  (1.626 m), weight 67.6 kg, last menstrual period 01/02/2022, SpO2 98 %.  Patient Vitals for the past 24 hrs:  BP Temp Temp src Pulse Resp SpO2 Height Weight  09/04/22 1325 -- -- -- -- -- 98 % -- --  09/04/22 1320 -- -- -- -- -- 98 % -- --  09/04/22 1316 136/88 -- -- 76 -- -- -- --  09/04/22 1315 -- -- -- -- -- 98 % -- --  09/04/22 1310 -- -- -- -- -- 98 % -- --  09/04/22 1305 -- -- -- -- -- 98 % -- --  09/04/22 1301 134/83 -- -- 81 -- -- -- --  09/04/22 1300 -- -- -- -- -- 98 % -- --  09/04/22 1255 -- -- -- -- -- 98 % -- --  09/04/22 1250 -- -- -- -- -- 98 % -- --  09/04/22 1246 (!) 138/95 -- -- 78 -- -- -- --  09/04/22 1245 -- -- -- -- -- 98 % -- --  09/04/22 1240 -- -- -- -- -- 98 % -- --  09/04/22 1235 -- -- -- -- -- 97 % -- --  09/04/22 1231 139/88 -- -- 81 -- -- -- --  09/04/22 1230 -- -- -- -- -- 98 % -- --  09/04/22 1225 -- -- -- -- -- 98 % -- --  09/04/22 1220 -- -- -- -- -- 98 % -- --  09/04/22 1216 (!) 138/90 -- -- 81 -- -- -- --  09/04/22 1215 -- -- -- -- -- 99 % -- --  09/04/22 1210 -- -- -- -- -- 99 % -- --  09/04/22 1205 -- -- -- -- -- 98 % -- --  09/04/22 1201 130/86 -- -- 83 -- -- -- --  09/04/22 1200 -- -- -- -- -- 98 % -- --  09/04/22 1155 -- -- -- -- -- 99 % -- --  09/04/22 1150 -- -- -- -- -- 98 % -- --  09/04/22 1146 129/88 -- -- 80 -- -- -- --  09/04/22 1145 -- -- -- -- -- 98 % -- --  09/04/22 1140 -- -- -- -- -- 98 % -- --  09/04/22 1135 -- -- -- -- -- 98 % -- --  09/04/22 1131 (!) 131/90 -- -- 86 -- -- -- --  09/04/22 1130 -- -- -- -- -- 98 % -- --  09/04/22 1125 -- -- -- -- -- 98 % -- --  09/04/22 1120 -- -- -- -- -- 98 % -- --  09/04/22 1117 134/88 -- -- 91 -- -- -- --  09/04/22 1058 (!) 132/90 98.2 F (36.8 C) Oral (!) 101 18 -- -- --   09/04/22 1055 -- -- -- -- -- -- 5\' 4"  (1.626 m) 67.6 kg    Physical Exam Vitals and nursing note reviewed.  Constitutional:      General: She is not in acute distress.    Appearance: She is well-developed.     Comments: Pregnant female  HENT:     Head: Normocephalic and atraumatic.  Eyes:     General: No scleral icterus.    Conjunctiva/sclera: Conjunctivae normal.  Cardiovascular:     Rate and Rhythm: Normal rate.  Pulmonary:     Effort: Pulmonary effort is normal.  Chest:     Chest wall: No tenderness.  Abdominal:     Palpations: Abdomen is soft.     Tenderness: There is no abdominal tenderness. There is no guarding or rebound.     Comments: Gravid  Genitourinary:    Vagina: Normal.  Musculoskeletal:        General: Normal range of motion.     Cervical back: Normal range of motion and neck supple.  Skin:    General: Skin is warm and dry.     Findings: No rash.  Neurological:     Mental Status: She is alert and oriented to person, place, and time.    Dilation: 1.5 Effacement (%): 50 Cervical Position: Posterior Station: -3 Presentation: Vertex Exam by:: Lauretta Chester MD   MAU Course  Procedures  NST  I reviewed the patient's fetal monitoring.  Baseline HR: 130s Variability:  moderate Accels:present Decels: none Toco is irritable A/P: Reactive NST  Reassured regarding fetal status.    MDM: high  This patient presents to the ED for concern of   Chief Complaint  Patient presents with   Abdominal Pain     These complains involves an extensive number of treatment options, and is a complaint that carries with it a high risk of complications and morbidity.  The differential diagnosis for  1. Cramping INCLUDES preterm labor, normal variant, false labor 2. Elevated BP INCLUDES preeclampsia, gestational hypertension, transient elevated BP  Co morbidities that complicate the patient evaluation: heart failure, history of preeclampsia, mitral valve  regurgitation, CSx2 with bladder injury  Additional history obtained from partner- at bedside  External records from outside source obtained and reviewed including Prenatal care records  Lab Tests: UA, CMP, CBC, and Urine Protein Creatinine Ratio  I ordered, and personally interpreted labs.  The pertinent results include:  slightly elevated AST but not dramatically.     Reevaluation of the patient after these medicines showed that the patient improved I have reviewed the patients home medicines and have made adjustments as needed  MAU COURSE 1:47 PM updated family about lab results. BPs have been mild range but mostly DBP elevation. CMP/CBC WNL. UPC is pending as is BNP.  Patient is comfortable with discharge.   Assessment and Plan   1. Cramping affecting pregnancy, antepartum   2. Previous cesarean delivery affecting pregnancy   3. Anemia, unspecified type   4. NST (non-stress test) reactive   5. Elevated BP without diagnosis of hypertension   6. Irregular uterine contractions    - Recommended monitoring  cramping and if worsening to return to MAU - Reviewed keeping outpatient appointments.  - Reviewed in detail plan of care for CS on 4/2 - I will contact her if BNP is elevated - BP was mild range but none > 4 hrs apart   After the interventions noted above, I reevaluated the patient and found that they have :improved  Dispostion: discharged  Future Appointments  Date Time Provider [place]  09/05/2022  2:30 PM Osborne Oman, MD [facility]  09/13/2022 11:10 AM Inez Catalina, MD [facility]  09/14/2022  9:40 AM Freada Bergeron, MD [facility]  09/17/2022  7:15 AM [facility] NURSE [facility]  09/17/2022  7:30 AM [facility] US3 [facility] [place]    Allergies as of 09/04/2022       Reactions   Other Other (See Comments)   Penicillins Rash        Medication List     TAKE these medications    aspirin 81 MG chewable  tablet Commonly known as: Aspirin Childrens Chew 1 tablet (81 mg total) by mouth daily.   [facility] Prenatal Gummy 0.18-25 MG Chew Chew 1 tablet by mouth daily.   ferrous sulfate 325 (65 FE) MG EC tablet Take 1 tablet (325 mg total) by mouth every other day.   metoprolol succinate 25 MG 24 hr tablet Commonly known as: Toprol XL Take 0.5 tablets (12.5 mg total) by mouth daily.   omeprazole 20 MG tablet Commonly known as: PriLOSEC OTC Take 1 tablet (20 mg total) by mouth daily.         Juanita Craver [facility] 09/04/2022, 1:47 PM

## 2022-09-04 NOTE — MAU Note (Signed)
[name] is a 37 y.o. at [redacted]w[redacted]d here in MAU reporting: lower abdominal cramping and pelvic pressure that began this morning.  Denies VB or LOF.  Reports +FM. LMP: NA Onset of complaint: today Pain score: 7 Vitals:   09/04/22 1058  BP: (!) 132/90  Pulse: (!) 101  Resp: 18  Temp: 98.2 F (36.8 C)     FHT:143 bpm Lab orders placed from triage:   UA

## 2022-09-05 ENCOUNTER — Encounter: Payer: Self-pay | Admitting: Obstetrics & Gynecology

## 2022-09-05 ENCOUNTER — Encounter (HOSPITAL_COMMUNITY): Payer: Self-pay

## 2022-09-05 ENCOUNTER — Telehealth (HOSPITAL_COMMUNITY): Payer: Self-pay | Admitting: *Deleted

## 2022-09-05 ENCOUNTER — Encounter: Payer: Self-pay | Admitting: Cardiology

## 2022-09-05 ENCOUNTER — Ambulatory Visit (INDEPENDENT_AMBULATORY_CARE_PROVIDER_SITE_OTHER): Payer: Medicaid Other | Admitting: Obstetrics & Gynecology

## 2022-09-05 VITALS — BP 138/85 | HR 98 | Wt 151.0 lb

## 2022-09-05 DIAGNOSIS — Z8759 Personal history of other complications of pregnancy, childbirth and the puerperium: Secondary | ICD-10-CM

## 2022-09-05 DIAGNOSIS — I340 Nonrheumatic mitral (valve) insufficiency: Secondary | ICD-10-CM

## 2022-09-05 DIAGNOSIS — O34219 Maternal care for unspecified type scar from previous cesarean delivery: Secondary | ICD-10-CM

## 2022-09-05 DIAGNOSIS — Z3A35 35 weeks gestation of pregnancy: Secondary | ICD-10-CM

## 2022-09-05 DIAGNOSIS — O99413 Diseases of the circulatory system complicating pregnancy, third trimester: Secondary | ICD-10-CM

## 2022-09-05 DIAGNOSIS — I5023 Acute on chronic systolic (congestive) heart failure: Secondary | ICD-10-CM

## 2022-09-05 DIAGNOSIS — O09523 Supervision of elderly multigravida, third trimester: Secondary | ICD-10-CM

## 2022-09-05 DIAGNOSIS — O0990 Supervision of high risk pregnancy, unspecified, unspecified trimester: Secondary | ICD-10-CM

## 2022-09-05 NOTE — Patient Instructions (Addendum)
Yvetta Ambrosi  09/05/2022   Your procedure is scheduled on:  09/18/2022  Arrive at [facility] at Entrance C on [address] at [facility]  and [address]. You are invited to use the FREE valet parking or use the Visitor's parking deck.  Pick up the phone at the desk and dial [phone].  Call this number if you have problems the morning of surgery: [phone]  Remember:   Do not eat food:(After Midnight) Desps de medianoche.  Do not drink clear liquids: (After Midnight) Desps de medianoche.  Take these medicines the morning of surgery with A SIP OF WATER:  none   Do not wear jewelry, make-up or nail polish.  Do not wear lotions, powders, or perfumes. Do not wear deodorant.  Do not shave 48 hours prior to surgery.  Do not bring valuables to the hospital.  [facility] is not   responsible for any belongings or valuables brought to the hospital.  Contacts, dentures or bridgework may not be worn into surgery.  Leave suitcase in the car. After surgery it may be brought to your room.  For patients admitted to the hospital, checkout time is 11:00 AM the day of              discharge.      Please read over the following fact sheets that you were given:     Preparing for Surgery

## 2022-09-05 NOTE — Telephone Encounter (Signed)
Preadmission screen  

## 2022-09-05 NOTE — Progress Notes (Signed)
Called and spoke to the patient about her echo results. EF has declined. She remains stable from a CV standpoint. Will discuss with OBGYN/MFM/anesthesia teams about delivery plan. Would recommend delivering by 37w.

## 2022-09-05 NOTE — Patient Instructions (Signed)
Return to office for any scheduled appointments. Call the office or go to the MAU at [facility] at [place] if: You begin to have strong, frequent contractions Your water breaks.  Sometimes it is a big gush of fluid, sometimes it is just a trickle that keeps getting your underwear wet or running down your legs You have vaginal bleeding.  It is normal to have a small amount of spotting if your cervix was checked.  You do not feel your baby moving like normal.  If you do not, get something to eat and drink and lay down and focus on feeling your baby move.   If your baby is still not moving like normal, you should call the office or go to MAU. Any other obstetric concerns.  

## 2022-09-05 NOTE — Progress Notes (Signed)
PRENATAL VISIT NOTE  Subjective:  [name] is a 37 y.o. [id number] at [redacted]w[redacted]d being seen today for ongoing prenatal care.  She is currently monitored for the following issues for this high-risk pregnancy and has Supervision of high risk pregnancy, antepartum; History of gestational hypertension; Multigravida of advanced maternal age in third trimester; History of preterm delivery, currently pregnant; UTI in pregnancy; Abnormal Pap smear of cervix; Pyelonephritis affecting pregnancy in second trimester; Systolic heart failure ([place]); Maternal mitral valve regurgitation affecting pregnancy in third trimester, antepartum; Previous cesarean delivery affecting pregnancy; RED CHART ROUNDS; and Anemia on their problem list.  Patient reports no complaints.  Had negative evaluation for abdominal pain yesterday in MAU. Contractions: Not present. Vag. Bleeding: None.  Movement: Present. Denies leaking of fluid.   The following portions of the patient's history were reviewed and updated as appropriate: allergies, current medications, past family history, past medical history, past social history, past surgical history and problem list.   Objective:   Vitals:   09/05/22 1428  BP: 138/85  Pulse: 98  Weight: 151 lb (68.5 kg)    Fetal Status: Fetal Heart Rate (bpm): 135   Movement: Present     General:  Alert, oriented and cooperative. Patient is in no acute distress.  Skin: Skin is warm and dry. No rash noted.   Cardiovascular: Normal heart rate noted  Respiratory: Normal respiratory effort, no problems with respiration noted  Abdomen: Soft, gravid, appropriate for gestational age.  Pain/Pressure: Absent     Pelvic: Cervical exam deferred        Extremities: Normal range of motion.  Edema: None  Mental Status: Normal mood and affect. Normal behavior. Normal judgment and thought content.   ECHOCARDIOGRAM COMPLETE  Result Date: 08/31/2022    ECHOCARDIOGRAM REPORT   Patient Name:   [name] Date of Exam: 08/31/2022 Medical Rec #:  [id number]        Height:       64.0 in Accession #:    [id number]       Weight:       150.0 lb Date of Birth:  Aug 29, 1985        BSA:          1.731 m Patient Age:    46 years         BP:           128/80 mmHg Patient Gender: F                HR:           77 bpm. Exam Location:  [address] Procedure: 2D Echo, Cardiac Doppler, Color Doppler, 3D Echo and Strain Analysis Indications:    I34.0 Nonrheumatic mitral (valve) insufficiency  History:        Patient has prior history of Echocardiogram examinations, most                 recent 07/20/2022. CHF. Palpitations. Dizziness. Tachycardia. High                 risk pregnancy in second trimester.  Sonographer:    Diamond Nickel RCS Referring Phys: Gwyndolyn Kaufman MD IMPRESSIONS  1. Left ventricular ejection fraction, by estimation, is 35 to 40%. The left ventricle has moderately decreased function. The left ventricle demonstrates global hypokinesis. The left ventricular internal cavity size was mildly to moderately dilated. There is mild left ventricular hypertrophy. Left ventricular diastolic parameters were normal. The average left ventricular global longitudinal strain is -  14.0 %. The global longitudinal strain is abnormal.  2. Right ventricular systolic function is normal. The right ventricular size is normal. Tricuspid regurgitation signal is inadequate for assessing PA pressure.  3. Left atrial size was mildly dilated.  4. Moderate to severe mitral valve regurgitation.  5. The aortic valve is normal in structure. Aortic valve regurgitation is not visualized.  6. The inferior vena cava is normal in size with greater than 50% respiratory variability, suggesting right atrial pressure of 3 mmHg. Conclusion(s)/Recommendation(s): Compared to prior echo 07/20/2022, EF has mildly declined. FINDINGS  Left Ventricle: Left ventricular ejection fraction, by estimation, is 35 to 40%. The left ventricle has moderately  decreased function. The left ventricle demonstrates global hypokinesis. The average left ventricular global longitudinal strain is -14.0 %. The global longitudinal strain is abnormal. The left ventricular internal cavity size was mildly to moderately dilated. There is mild left ventricular hypertrophy. Left ventricular diastolic parameters were normal. Right Ventricle: The right ventricular size is normal. Right ventricular systolic function is normal. Tricuspid regurgitation signal is inadequate for assessing PA pressure. Left Atrium: Left atrial size was mildly dilated. Right Atrium: Right atrial size was normal in size. Pericardium: There is no evidence of pericardial effusion. Mitral Valve: Moderate to severe mitral valve regurgitation, with posteriorly-directed jet. Tricuspid Valve: Tricuspid valve regurgitation is not demonstrated. Aortic Valve: The aortic valve is normal in structure. Aortic valve regurgitation is not visualized. Pulmonic Valve: Pulmonic valve regurgitation is not visualized. Aorta: The aortic root and ascending aorta are structurally normal, with no evidence of dilitation. Venous: The inferior vena cava is normal in size with greater than 50% respiratory variability, suggesting right atrial pressure of 3 mmHg. IAS/Shunts: The interatrial septum was not well visualized.  LEFT VENTRICLE PLAX 2D LVIDd:         5.10 cm   Diastology LVIDs:         4.50 cm   LV e' medial:    17.20 cm/s LV PW:         1.50 cm   LV E/e' medial:  7.1 LV IVS:        0.90 cm   LV e' lateral:   13.60 cm/s LVOT diam:     2.00 cm   LV E/e' lateral: 9.0 LV SV:         50 LV SV Index:   29        2D Longitudinal Strain LVOT Area:     3.14 cm  2D Strain GLS Avg:     -14.0 %                           3D Volume EF:                          3D EF:        37 %                          LV EDV:       178 ml                          LV ESV:       113 ml                          LV SV:  65 ml RIGHT VENTRICLE RV Basal diam:   2.70 cm RV S prime:     12.20 cm/s TAPSE (M-mode): 2.6 cm LEFT ATRIUM             Index        RIGHT ATRIUM           Index LA diam:        3.50 cm 2.02 cm/m   RA Pressure: 3.00 mmHg LA Vol (A2C):   43.6 ml 25.18 ml/m  RA Area:     13.60 cm LA Vol (A4C):   52.6 ml 30.38 ml/m  RA Volume:   32.20 ml  18.60 ml/m LA Biplane Vol: 49.4 ml 28.53 ml/m  AORTIC VALVE LVOT Vmax:   87.00 cm/s LVOT Vmean:  58.500 cm/s LVOT VTI:    0.158 m  AORTA Ao Root diam: 2.60 cm Ao Asc diam:  2.90 cm MITRAL VALVE                  TRICUSPID VALVE MV Area (PHT): 4.26 cm       Estimated RAP:  3.00 mmHg MV Decel Time: 178 msec MR Peak grad:    119.2 mmHg   SHUNTS MR Mean grad:    70.0 mmHg    Systemic VTI:  0.16 m MR Vmax:         546.00 cm/s  Systemic Diam: 2.00 cm MR Vmean:        383.0 cm/s MR PISA:         6.28 cm MR PISA Eff ROA: 40 mm MR PISA Radius:  1.00 cm MV E velocity: 122.00 cm/s MV A velocity: 105.00 cm/s MV E/A ratio:  1.16 Landscape architect signed by Phineas Inches Signature Date/Time: 08/31/2022/10:22:18 AM    Final    [place] MFM OB FOLLOW UP  Result Date: 08/17/2022 ----------------------------------------------------------------------  OBSTETRICS REPORT                       (Signed Final 08/17/2022 11:16 am) ---------------------------------------------------------------------- Patient Info  ID #:       [id number]                          D.O.B.:  11/15/1985 (36 yrs)  Name:       Hillery Hunter                Visit Date: 08/17/2022 09:55 am ---------------------------------------------------------------------- Performed By  Attending:        Valeda Malm DO       Ref. Address:     [id number] [place], [place]  Performed By:     Milus Glazier,      [facility]  at                                                             [facility]                                                              [facility]  Referred By:      Willene Hatchet ---------------------------------------------------------------------- Orders  #  Description                           Code        Ordered By  1  [place] MFM OB FOLLOW UP                   [id number]    Valeda Malm ----------------------------------------------------------------------  #  Order #                     Accession #                Episode #  1  [id number]                   [id number]                 [id number] ---------------------------------------------------------------------- Indications  [redacted] weeks gestation of pregnancy                Z3A.32  Poor obstetric history (prior pre-term labor)  O09.219  54W  Advanced maternal age multigravida 4+,        O65.523  third trimester (69 yrs)  History of cesarean delivery, currently        O52.219  pregnant x2  Fetal or maternal indication (maternal mitral  O35.8XX0  valve regurg )  Poor obstetric history: Previous               O09.299  preeclampsia / eclampsia/gestational HTN  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 Age:  34w 2d         89  %    CI:        77.32   %    70 - 86                                                          FL/HC:      20.4   %    19.1 - 21.3  HC:      306.4  mm     G. Age:  34w 1d         59  %     HC/AC:      1.13        0.96 - 1.17  AC:      272.1  mm     G. Age:  31w 2d         18  %    FL/BPD:     73.4   %    71 - 87  FL:       62.5  mm     G. Age:  32w 3d         35  %    FL/AC:      23.0   %    20 - 24  HUM:      53.2  mm     G. Age:  31w 0d         25  %  Est. FW:    1913  gm      4 lb 3 oz     31  % ---------------------------------------------------------------------- OB History  Gravidity:    5         Term:   1        Prem:   1        SAB:   2  Living:       2 ---------------------------------------------------------------------- Gestational Age  LMP:           32w 3d        Date:  01/02/22                  EDD:   10/09/22  U/S Today:     33w 0d                                        EDD:   10/05/22  Best:          32w 3d     Det. By:  LMP  (01/02/22)          EDD:   10/09/22 ---------------------------------------------------------------------- Anatomy  Cranium:               Appears normal         Aortic Arch:            Previously seen  Cavum:                 Appears normal         Ductal Arch:  Previously seen  Ventricles:            Previously seen        Diaphragm:              Appears normal  Choroid Plexus:        Previously seen        Stomach:                Appears normal, left                                                                        sided  Cerebellum:            Previously seen        Abdomen:                Appears normal  Posterior Fossa:       Previously seen        Abdominal Wall:         Previously seen  Nuchal Fold:           Previously seen        Cord Vessels:           Previously seen  Face:                  Orbits and profile     Kidneys:                Appear normal                         previously seen  Lips:                  Previously seen        Bladder:                Appears normal  Thoracic:              Appears normal         Spine:                  Previously seen  Heart:                 Appears normal         Upper Extremities:      Previously  seen                         (4CH, axis, and                         situs)  RVOT:                  Previously seen        Lower Extremities:      Previously seen  LVOT:                  Previously seen ---------------------------------------------------------------------- Cervix Uterus Adnexa  Right Ovary  Within normal limits.  Left Ovary  Within normal limits. ---------------------------------------------------------------------- Comments  The patient is here for a follow-up ultrasound at [place] 3d for LV  systolic dysfunction (EF [id number] w/moderate mitral valve  regurgitation) and follows with OB cardiology. She is  compliant with Torprol and has no concerning cardiac  symptoms.  Sonographic findings  Single intrauterine pregnancy.  Fetal cardiac activity:  Observed and appears normal.  Presentation: Cephalic.  Interval fetal anatomy appears normal  Fetal biometry shows the estimated fetal weight at the 31  percentile.  Amniotic fluid volume: Within normal limits. MVP: 6.07 cm.  Placenta: Posterior.  Recommendations  - F/u growth in 4 weeks  - Repeat CD around 37-38 weeks  - Discussed delivery at [facility]. Anesthesia is  aware of her and the need to maintain euvolemia around the  time of delivery. ----------------------------------------------------------------------                 Valeda Malm, DO Electronically Signed Final Report   08/17/2022 11:16 am ----------------------------------------------------------------------   Assessment and Plan:  Pregnancy: [id number] at [redacted]w[redacted]d 1. Maternal mitral valve regurgitation affecting pregnancy in third trimester, antepartum 2. Acute on chronic systolic heart failure [facility]) Patient with reduced EF on ECHO on 08/31/22 (35-40%), was 40-45% on 07/20/22.  Patient said she was called by Dr. Johney Frame she needs to deliver earlier, already scheduled at 56 weeks for RCS.  Dr. Jacolyn Reedy notes in chart recommended delivery by 37 weeks. Patient reports feeling more tired  lately, no CP or SOB. Message sent to MFM, OB Cards teammates and Dr. Johney Frame asking for clarification of delivery plan in light of this declined EF. Will follow up recommendations.  3. History of gestational hypertension Normal BP today. Patient on Toprol XL 25 mg daily and ASA.  4.  Previous cesarean delivery affecting pregnancy Scheduled for RCS at 37 weeks  5. Multigravida of advanced maternal age in third trimester 6. [redacted] weeks gestation of pregnancy 7. Supervision of high risk pregnancy, antepartum Preterm labor symptoms and general obstetric precautions including but not limited to vaginal bleeding, contractions, leaking of fluid and fetal movement were reviewed in detail with the patient. Please refer to After Visit Summary for other counseling recommendations.   Return for OFFICE OB VISIT (MD only).  Future Appointments  Date Time Provider [place]  09/13/2022 11:10 AM Inez Catalina, MD [facility]  09/14/2022  9:40 AM Freada Bergeron, MD [facility]  09/17/2022  7:15 AM [facility] NURSE [facility] [place]  09/17/2022  7:30 AM [facility] US3 [facility]  09/17/2022  9:30 AM [facility] PAT 1 [facility] None    Verita Schneiders, MD

## 2022-09-06 ENCOUNTER — Telehealth: Payer: Self-pay | Admitting: *Deleted

## 2022-09-06 ENCOUNTER — Other Ambulatory Visit: Payer: Self-pay | Admitting: Family Medicine

## 2022-09-06 ENCOUNTER — Telehealth: Payer: Self-pay | Admitting: Family Medicine

## 2022-09-06 DIAGNOSIS — I5020 Unspecified systolic (congestive) heart failure: Secondary | ICD-10-CM

## 2022-09-06 DIAGNOSIS — I340 Nonrheumatic mitral (valve) insufficiency: Secondary | ICD-10-CM

## 2022-09-06 DIAGNOSIS — I5022 Chronic systolic (congestive) heart failure: Secondary | ICD-10-CM

## 2022-09-06 NOTE — Telephone Encounter (Signed)
Received call from Dr Johney Frame with Cardiology - patient's echo shows mod-severe MR and LVEF reduced to 35-40% (closer to 35%). Baseline was 40-45%. Patient did have BNP on 3/19 - normalized at 87. Patient currently not symptomatic.   I spoke with Dr Donalee Citrin regarding patient - as patient is not symptomatic and fairly compensated, recommended delaying delivery. In discussing with cardiology, will repeat echo next week. If further decreased, then they would recommend delivery.

## 2022-09-06 NOTE — Telephone Encounter (Signed)
-----   Message from Armando Gang sent at 09/06/2022 10:35 AM EDT ----- Regarding: echo Schedule for  echo 09-07-22 @ [address]    ----- Message ----- From: Freada Bergeron, MD Sent: 09/06/2022   9:02 AM EDT To: Nuala Alpha, LPN; Gretta Began; #  Hey Team,  Her EF has dropped on her most recent TTE and they want to try to push her to 37w of pregnancy if they can. Can we somehow manage to get her in for TTE next week to just ensure her EF is not dropping further as this would prompt [place] to take her for c-section earlier?  Thank you so much!   -Nira Conn

## 2022-09-06 NOTE — Telephone Encounter (Signed)
Pts OB ECHO is scheduled for 09/07/22 at 2:45 pm.  Pt made aware of appt date and time by Echo Scheduler.

## 2022-09-06 NOTE — Telephone Encounter (Signed)
-----   Message from Freada Bergeron, MD sent at 09/06/2022  9:01 AM EDT ----- Hey Team,  Her EF has dropped on her most recent TTE and they want to try to push her to 37w of pregnancy if they can. Can we somehow manage to get her in for TTE next week to just ensure her EF is not dropping further as this would prompt [place] to take her for c-section earlier?  Thank you so much!   -Nira Conn

## 2022-09-06 NOTE — Telephone Encounter (Signed)
OB ECHO order placed in the system for the pt to have done next week, per Dr. Johney Frame.  Dr. Johney Frame sent Echo Scheduler a message about this and to get this appt arranged for next week.    Pt aware of this plan via mychart message from Dr. Johney Frame.

## 2022-09-07 ENCOUNTER — Ambulatory Visit (HOSPITAL_COMMUNITY): Payer: Medicaid Other | Attending: Cardiovascular Disease

## 2022-09-07 DIAGNOSIS — O99412 Diseases of the circulatory system complicating pregnancy, second trimester: Secondary | ICD-10-CM | POA: Insufficient documentation

## 2022-09-07 DIAGNOSIS — I340 Nonrheumatic mitral (valve) insufficiency: Secondary | ICD-10-CM | POA: Diagnosis present

## 2022-09-07 DIAGNOSIS — I5022 Chronic systolic (congestive) heart failure: Secondary | ICD-10-CM | POA: Insufficient documentation

## 2022-09-07 DIAGNOSIS — I5020 Unspecified systolic (congestive) heart failure: Secondary | ICD-10-CM

## 2022-09-07 DIAGNOSIS — I5030 Unspecified diastolic (congestive) heart failure: Secondary | ICD-10-CM

## 2022-09-08 LAB — ECHOCARDIOGRAM COMPLETE
Area-P 1/2: 4.21 cm2
MV M vel: 5.06 m/s
MV Peak grad: 102.4 mmHg
Radius: 0.75 cm
S' Lateral: 5 cm

## 2022-09-10 NOTE — Progress Notes (Deleted)
Cardio-Obstetrics Clinic  Follow Up Note   Date:  09/10/2022   ID:  [name], DOB 08/24/1985, MRN [id number]  PCP:  Patient, No Pcp Per   [facility] Cardiologist:  Freada Bergeron, MD  Electrophysiologist:  None       Referring MD: Freada Bergeron, MD   Chief Complaint: Moderate to severe MR  History of Present Illness:    [name] is a 37 y.o. female [[id number]] who returns for follow up of moderate to severe MR.  Patient was hospitalized in 04/2022 for pyelonephritis.  On admission, she was noted to by tachycardic and ECG was performed which revealed sinus tachycardia with borderline LVH and inferolateral TWI. Cardiology was called overnight and TTE was recommended which revealed  LVEF 40% with apical lateral hypokinesis, normal RV, mildly thickened and calcified mitral valve leaflet (abnormal for age) but no definitive post-inflammatory changes with moderate to severe MR. Trops negative x2. BNP 109. She appeared euvolemic on exam with no chest pain or SOB. We started metoprolol 12.5mg  XL daily.  Notably, had pre-eclampsia with prior pregnancies at time of delivery. Resolved with delivery and did not require BP meds post-pregnancy.  Was seen in clinic on 05/07/22 where she was doing well from a CV standpoint. No HF symptoms. TTE 05/22/22 stable with LVEF 40-45%, normal RV, moderate to severe MR.   Cardiac monitor 07/2022 with NSR, rare ectopy, 1 six beat run of NSVT. She has been continued on metoprolol.  TTE 07/2022 with stable EF 45-50%, moderate MR, normal RV  Was last seen in clinic on 08/24/22. Was stable from a CV standpoint. Repeat TTE 08/31/22 with interval decrease in EF to 37% by 3D, moderate to severe MR. Given concern that she was developing worsening systolic HF, discussion was held with OB/MFM/anesthesia. Goal is to get to 37w. Repeat TTE 09/19/2022 stable with EF 35-40%, moderate MR.  Today, ***   Prior CV Studies  Reviewed: The following studies were reviewed today: TTE September 19, 2022: IMPRESSIONS     1. Left ventricular ejection fraction, by estimation, is 35 to 40%. The  left ventricle has moderately decreased function. The left ventricle  demonstrates global hypokinesis. Left ventricular diastolic parameters are  consistent with Grade I diastolic  dysfunction (impaired relaxation). The average left ventricular global  longitudinal strain is -12.0 %. The global longitudinal strain is  abnormal.   2. Right ventricular systolic function is normal. The right ventricular  size is normal. There is normal pulmonary artery systolic pressure.   3. The mitral valve is normal in structure. Moderate mitral valve  regurgitation. No evidence of mitral stenosis.   4. The aortic valve is normal in structure. Aortic valve regurgitation is  not visualized. No aortic stenosis is present.   5. The inferior vena cava is normal in size with greater than 50%  respiratory variability, suggesting right atrial pressure of 3 mmHg.   Comparison(s): A prior study was performed on 08/31/22. No significant  change from prior study.   TTE 08/31/22: IMPRESSIONS     1. Left ventricular ejection fraction, by estimation, is 35 to 40%. The  left ventricle has moderately decreased function. The left ventricle  demonstrates global hypokinesis. The left ventricular internal cavity size  was mildly to moderately dilated.  There is mild left ventricular hypertrophy. Left ventricular diastolic  parameters were normal. The average left ventricular global longitudinal  strain is -14.0 %. The global longitudinal strain is abnormal.   2. Right ventricular systolic function is  normal. The right ventricular  size is normal. Tricuspid regurgitation signal is inadequate for assessing  PA pressure.   3. Left atrial size was mildly dilated.   4. Moderate to severe mitral valve regurgitation.   5. The aortic valve is normal in structure.  Aortic valve regurgitation is  not visualized.   6. The inferior vena cava is normal in size with greater than 50%  respiratory variability, suggesting right atrial pressure of 3 mmHg.   Conclusion(s)/Recommendation(s): Compared to prior echo 07/20/2022, EF has  mildly declined.  TTE 07/2022: IMPRESSIONS     1. Mild global reduction in LV function; moderate MR (may be  underestimated; 2 jets noted; suggest TEE to further assess).   2. Left ventricular ejection fraction, by estimation, is 45 to 50%. The  left ventricle has mildly decreased function. The left ventricle  demonstrates global hypokinesis. Left ventricular diastolic parameters  were normal. The average left ventricular  global longitudinal strain is -19.9 %. The global longitudinal strain is  normal.   3. Right ventricular systolic function is normal. The right ventricular  size is normal.   4. The mitral valve is normal in structure. Moderate mitral valve  regurgitation. No evidence of mitral stenosis.   5. The aortic valve is tricuspid. Aortic valve regurgitation is not  visualized. No aortic stenosis is present.   6. The inferior vena cava is normal in size with greater than 50%  respiratory variability, suggesting right atrial pressure of 3 mmHg.   Cardiac Monitor 07/2022:   Patch wear time was 2 days and 18 hours   Predominant rhythm is NSR with HR 86   There was 1 run of nonsustained VT lasting 6 beats   Rare ectopy (<1% SVE and VE)   Patient symptoms correlated with NSR   No sustained arrhythmias or significant pauses     Patch Wear Time:  2 days and 18 hours (2024-02-05T23:45:56-0500 to 2024-02-08T18:00:45-0500)   Patient had a min HR of 58 bpm, max HR of 179 bpm, and avg HR of 86 bpm. Predominant underlying rhythm was Sinus Rhythm. 1 run of Ventricular Tachycardia occurred lasting 6 beats with a max rate of 179 bpm (avg 167 bpm). Isolated SVEs were rare (<1.0%),  SVE Couplets were rare (<1.0%), and no SVE  Triplets were present. Isolated VEs were rare (<1.0%), VE Couplets were rare (<1.0%), and no VE Triplets were present.  TTE 05/22/22: IMPRESSIONS     1. Left ventricular ejection fraction, by estimation, is 40 to 45%. Left  ventricular ejection fraction by 3D volume is 45 %. The left ventricle has  mildly decreased function. The left ventricle demonstrates global  hypokinesis. The left ventricular  internal cavity size was mildly dilated. Left ventricular diastolic  parameters were normal.   2. Right ventricular systolic function is normal. The right ventricular  size is normal. Tricuspid regurgitation signal is inadequate for assessing  PA pressure.   3. The mitral valve is normal in structure. Moderate to severe mitral  valve regurgitation.   4. The aortic valve is tricuspid. Aortic valve regurgitation is not  visualized. No aortic stenosis is present.   5. The inferior vena cava is normal in size with greater than 50%  respiratory variability, suggesting right atrial pressure of 3 mmHg.   Comparison(s): No significant change from prior study. Prior images  reviewed side by side.   Conclusion(s)/Recommendation(s): Consider TEE to clarify mechanism and  severity of mitral insufficiency.    TTE 04/22/22: IMPRESSIONS  1. Left ventricular ejection fraction, by estimation, is 40%. Left  ventricular ejection fraction by 3D volume is 40 %. The left ventricle has  mild to moderately decreased function. The left ventricle demonstrates  global hypokinesis and regional wall  motion abnormalities (see scoring diagram/findings for description). The  left ventricular internal cavity size was mildly dilated. Left ventricular  diastolic parameters were normal.   2. Right ventricular systolic function is normal. The right ventricular  size is normal. Tricuspid regurgitation signal is inadequate for assessing  PA pressure.   3. The mitral valve is grossly normal. Moderate to severe mitral  valve  regurgitation. The mitral valve is mildly thickened for age, but no  definite post inflammatory change unless clinical relevance. Suspect  mechanism is secondary MR due to LV  dysfunction. No evidence of mitral stenosis.   4. The aortic valve was not well visualized. Aortic valve regurgitation  is not visualized. No aortic stenosis is present.   5. The inferior vena cava is normal in size with greater than 50%  respiratory variability, suggesting right atrial pressure of 3 mmHg.     Past Medical History:  Diagnosis Date   Bacterial infection    History of chicken pox    Systolic heart failure [facility])     Past Surgical History:  Procedure Laterality Date   CESAREAN SECTION     CESAREAN SECTION N/A 02/02/2015   Procedure: CESAREAN SECTION;  Surgeon: Thurnell Lose, MD;  Location: [place] [facility];  Service: Obstetrics;  Laterality: N/A;   DILATION AND CURETTAGE OF UTERUS     WISDOM TOOTH EXTRACTION        OB History     Gravida  5   Para  2   Term  1   Preterm  1   AB  2   Living  2      SAB  1   IAB  1   Ectopic      Multiple  0   Live Births  2               Current Medications: No outpatient medications have been marked as taking for the 09/14/22 encounter (Appointment) with Freada Bergeron, MD.     Allergies:   Other and Penicillins   Social History   Socioeconomic History   Marital status: Single    Spouse name: Not on file   Number of children: Not on file   Years of education: Not on file   Highest education level: Not on file  Occupational History   Not on file  Tobacco Use   Smoking status: Never   Smokeless tobacco: Never  Vaping Use   Vaping Use: Never used  Substance and Sexual Activity   Alcohol use: No   Drug use: No   Sexual activity: Not Currently    Birth control/protection: None  Other Topics Concern   Not on file  Social History Narrative   Not on file   Social Determinants of Health   Financial Resource  Strain: Not on file  Food Insecurity: No Food Insecurity (04/22/2022)   Hunger Vital Sign    Worried About Running Out of Food in the Last Year: Never true    Ran Out of Food in the Last Year: Never true  Transportation Needs: No Transportation Needs (04/22/2022)   PRAPARE - Hydrologist (Medical): No    Lack of Transportation (Non-Medical): No  Physical Activity: Not on file  Stress:  Not on file  Social Connections: Not on file      Family History  Problem Relation Age of Onset   Hypertension Mother    Hypertension Father    Diabetes Father    Hypertension Paternal Aunt    Asthma Neg Hx    Cancer Neg Hx    Heart disease Neg Hx    Stroke Neg Hx       ROS:   Please see the history of present illness.    Review of Systems  Constitutional:  Negative for chills and fever.  HENT:  Negative for nosebleeds and tinnitus.   Eyes:  Negative for blurred vision.  Respiratory:  Positive for shortness of breath.   Cardiovascular:  Positive for palpitations. Negative for chest pain, orthopnea, claudication, leg swelling and PND.  Gastrointestinal:  Negative for blood in stool, melena, nausea and vomiting.  Genitourinary:  Negative for dysuria and hematuria.  Musculoskeletal:  Negative for falls.  Neurological:  Negative for dizziness, loss of consciousness and headaches.    All other systems reviewed and are negative.   Labs/EKG Reviewed:    EKG:  No new tracing   Recent Labs: 07/16/2022: Magnesium 1.8 09/04/2022: ALT 44; B Natriuretic Peptide 87.5; BUN 9; Creatinine, Ser 0.54; Hemoglobin 11.0; Platelets 320; Potassium 4.1; Sodium 135   Recent Lipid Panel No results found for: "CHOL", "TRIG", "HDL", "CHOLHDL", "LDLCALC", "LDLDIRECT"  Physical Exam:    VS:  LMP 01/02/2022     Wt Readings from Last 3 Encounters:  09/05/22 151 lb (68.5 kg)  09/04/22 149 lb (67.6 kg)  08/30/22 150 lb (68 kg)     GEN:  Comfortable, NAD HEENT: Normal NECK: No  JVD; No carotid bruits CARDIAC: RRR, 3/6 systolic murmur heard throughout the precordium. No rubs or gallopse RESPIRATORY:  Clear bilaterally ABDOMEN: Gravid, nontender to palpation MUSCULOSKELETAL:  No edema, wamr SKIN: Warm and dry NEUROLOGIC:  Alert and oriented x 3 PSYCHIATRIC:  Normal affect    Risk Assessment/Risk Calculators:              ASSESSMENT & PLAN:    #Chronic Systolic HF: Patient admitted in 04/2022 for pyelonephritis with ECG revealing sinus tachycardia, possible LVH and inferior TWI. This abnormal ECG prompted TTE which revealed LVEF 40% with apical lateral hypokinesis, moderate-to-severe MR with mildly thickened valve for age. Had been stable throughout pregnancy, however, repeat TTE in 08/2022 with drop in EF to 35-40%, moderate to severe MR. Now planned for delivery at [place]. Once she is no longer pregnant, will proceed with more in-depth work-up including TEE/CMR and possible coronary CTA. -Discussed she is WHO class III with pregnancy, CARPREG score 5 corresponding to 40% chance of CV complications during pregnancy -TTE with drop in EF to 35-40% but was stable on repeat -Plan to deliver at 37w unless interval progression of symptoms -Currently euvolemic and compensated -Continue metoprolol 12.5mg  XL daily  -Once she is no longer pregnant, we can pursue further work-up with CMR/TEE for her valve and likely coronary CTA to assess coronary anatomy   #Moderate-to-severe MR: MV appears thickened with restricted leaflet motion (? Post-inflammatory vs rheumatic). Denies any history of rheumatic fever, IVDU, blood stream infections, severe childhood illness or family history of disease. Likely this has been chronic and ongoing and patient is compensated and relatively asymptomatic.  Once no longer pregnant, will pursue further work-up.  -Continue metop 12.5mg  XL daily  -Will pursue further work-up once patient is no longer pregnant as detailed  above  #Palpitations:  Improved. Zio monitor with rare ectopy, 1 six beat run of NSVT. -Continue metop 12.5mg  XL   #Dizziness: Improved. BP stable. Will monitor  #History of Pre-Eclampsia: #High Risk Pregnancy: -Continue ASA 81mg  daily -BP controlled -Management per OBGYN -Followed by Cardio OB team   There are no Patient Instructions on file for this visit.     Follow-up: 3 weeks  Medication Adjustments/Labs and Tests Ordered: Current medicines are reviewed at length with the patient today.  Concerns regarding medicines are outlined above.  Tests Ordered: No orders of the defined types were placed in this encounter.  Medication Changes: No orders of the defined types were placed in this encounter.    Cheryl Kaufman, MD

## 2022-09-11 ENCOUNTER — Inpatient Hospital Stay (HOSPITAL_COMMUNITY): Payer: Medicaid Other | Admitting: Anesthesiology

## 2022-09-11 ENCOUNTER — Encounter (HOSPITAL_COMMUNITY): Payer: Self-pay | Admitting: Obstetrics & Gynecology

## 2022-09-11 ENCOUNTER — Other Ambulatory Visit: Payer: Self-pay

## 2022-09-11 ENCOUNTER — Encounter (HOSPITAL_COMMUNITY)
Admission: AD | Disposition: A | Discharge status: Home / Self Care | Payer: Self-pay | Source: Home / Self Care | Attending: Obstetrics & Gynecology

## 2022-09-11 ENCOUNTER — Inpatient Hospital Stay (HOSPITAL_COMMUNITY)
Admission: AD | Admit: 2022-09-11 | Discharge: 2022-09-17 | DRG: 786 | Disposition: A | Discharge status: Home / Self Care | Payer: Medicaid Other | Attending: Obstetrics & Gynecology | Admitting: Obstetrics & Gynecology

## 2022-09-11 DIAGNOSIS — Z98891 History of uterine scar from previous surgery: Secondary | ICD-10-CM | POA: Diagnosis not present

## 2022-09-11 DIAGNOSIS — O0991 Supervision of high risk pregnancy, unspecified, first trimester: Secondary | ICD-10-CM

## 2022-09-11 DIAGNOSIS — D62 Acute posthemorrhagic anemia: Secondary | ICD-10-CM | POA: Diagnosis not present

## 2022-09-11 DIAGNOSIS — O1493 Unspecified pre-eclampsia, third trimester: Secondary | ICD-10-CM

## 2022-09-11 DIAGNOSIS — I519 Heart disease, unspecified: Secondary | ICD-10-CM | POA: Diagnosis not present

## 2022-09-11 DIAGNOSIS — Z3A36 36 weeks gestation of pregnancy: Secondary | ICD-10-CM

## 2022-09-11 DIAGNOSIS — O1414 Severe pre-eclampsia complicating childbirth: Secondary | ICD-10-CM

## 2022-09-11 DIAGNOSIS — O9942 Diseases of the circulatory system complicating childbirth: Secondary | ICD-10-CM | POA: Diagnosis present

## 2022-09-11 DIAGNOSIS — O99413 Diseases of the circulatory system complicating pregnancy, third trimester: Secondary | ICD-10-CM | POA: Diagnosis not present

## 2022-09-11 DIAGNOSIS — I340 Nonrheumatic mitral (valve) insufficiency: Secondary | ICD-10-CM

## 2022-09-11 DIAGNOSIS — O34219 Maternal care for unspecified type scar from previous cesarean delivery: Secondary | ICD-10-CM | POA: Diagnosis present

## 2022-09-11 DIAGNOSIS — I5030 Unspecified diastolic (congestive) heart failure: Secondary | ICD-10-CM

## 2022-09-11 DIAGNOSIS — O9A22 Injury, poisoning and certain other consequences of external causes complicating childbirth: Secondary | ICD-10-CM | POA: Diagnosis not present

## 2022-09-11 DIAGNOSIS — O34211 Maternal care for low transverse scar from previous cesarean delivery: Principal | ICD-10-CM | POA: Diagnosis present

## 2022-09-11 DIAGNOSIS — T8119XA Other postprocedural shock, initial encounter: Secondary | ICD-10-CM

## 2022-09-11 DIAGNOSIS — Z8759 Personal history of other complications of pregnancy, childbirth and the puerperium: Secondary | ICD-10-CM

## 2022-09-11 DIAGNOSIS — Z880 Allergy status to penicillin: Secondary | ICD-10-CM | POA: Diagnosis not present

## 2022-09-11 DIAGNOSIS — O9902 Anemia complicating childbirth: Secondary | ICD-10-CM | POA: Diagnosis present

## 2022-09-11 DIAGNOSIS — Z7982 Long term (current) use of aspirin: Secondary | ICD-10-CM | POA: Diagnosis not present

## 2022-09-11 DIAGNOSIS — I5022 Chronic systolic (congestive) heart failure: Secondary | ICD-10-CM

## 2022-09-11 DIAGNOSIS — O0990 Supervision of high risk pregnancy, unspecified, unspecified trimester: Secondary | ICD-10-CM

## 2022-09-11 DIAGNOSIS — O09523 Supervision of elderly multigravida, third trimester: Secondary | ICD-10-CM | POA: Diagnosis present

## 2022-09-11 DIAGNOSIS — O1410 Severe pre-eclampsia, unspecified trimester: Secondary | ICD-10-CM

## 2022-09-11 DIAGNOSIS — I428 Other cardiomyopathies: Secondary | ICD-10-CM | POA: Diagnosis not present

## 2022-09-11 DIAGNOSIS — I5020 Unspecified systolic (congestive) heart failure: Secondary | ICD-10-CM | POA: Diagnosis not present

## 2022-09-11 DIAGNOSIS — R9431 Abnormal electrocardiogram [ECG] [EKG]: Secondary | ICD-10-CM | POA: Diagnosis not present

## 2022-09-11 DIAGNOSIS — R578 Other shock: Secondary | ICD-10-CM | POA: Diagnosis not present

## 2022-09-11 LAB — COMPREHENSIVE METABOLIC PANEL
ALT: 39 U/L (ref 0–44)
AST: 44 U/L — ABNORMAL HIGH (ref 15–41)
Albumin: 3 g/dL — ABNORMAL LOW (ref 3.5–5.0)
Alkaline Phosphatase: 97 U/L (ref 38–126)
Anion gap: 12 (ref 5–15)
BUN: 13 mg/dL (ref 6–20)
CO2: 22 mmol/L (ref 22–32)
Calcium: 9 mg/dL (ref 8.9–10.3)
Chloride: 104 mmol/L (ref 98–111)
Creatinine, Ser: 0.71 mg/dL (ref 0.44–1.00)
GFR, Estimated: >60 mL/min (ref >60)
Glucose, Bld: 89 mg/dL (ref 70–99)
Potassium: 4.2 mmol/L (ref 3.5–5.1)
Sodium: 138 mmol/L (ref 135–145)
Total Bilirubin: 0.4 mg/dL (ref 0.3–1.2)
Total Protein: 6.7 g/dL (ref 6.5–8.1)

## 2022-09-11 LAB — CBC
HCT: 33.5 % — ABNORMAL LOW (ref 36.0–46.0)
Hemoglobin: 11.1 g/dL — ABNORMAL LOW (ref 12.0–15.0)
MCH: 25.1 pg — ABNORMAL LOW (ref 26.0–34.0)
MCHC: 33.1 g/dL (ref 30.0–36.0)
MCV: 75.6 fL — ABNORMAL LOW (ref 80.0–100.0)
Platelets: 336 10*3/uL (ref 150–400)
RBC: 4.43 MIL/uL (ref 3.87–5.11)
RDW: 14.1 % (ref 11.5–15.5)
WBC: 10.1 10*3/uL (ref 4.0–10.5)
nRBC: 0 % (ref 0.0–0.2)

## 2022-09-11 SURGERY — Surgical Case
Anesthesia: Regional | Site: Abdomen

## 2022-09-11 MED ORDER — CEFAZOLIN SODIUM-DEXTROSE 2-3 GM-%(50ML) IV SOLR
INTRAVENOUS | Status: DC | PRN
  Administered 2022-09-11: 2 g via INTRAVENOUS

## 2022-09-11 MED ORDER — SODIUM CHLORIDE 0.9 % IR SOLN
Status: DC | PRN
  Administered 2022-09-11: 1

## 2022-09-11 MED ORDER — LACTATED RINGERS IV SOLN: INTRAVENOUS | Status: DC

## 2022-09-11 MED ORDER — MAGNESIUM SULFATE BOLUS VIA INFUSION
4.0000 g | Freq: Once | INTRAVENOUS | Status: AC
  Administered 2022-09-11: 4 g via INTRAVENOUS
  Filled 2022-09-11: qty 1000

## 2022-09-11 MED ORDER — LABETALOL HCL 5 MG/ML IV SOLN
40.0000 mg | INTRAVENOUS | Status: DC | PRN
  Filled 2022-09-11: qty 8

## 2022-09-11 MED ORDER — DOBUTAMINE IN D5W 4-5 MG/ML-% IV SOLN
2.5000 ug/kg/min | INTRAVENOUS | Status: DC
  Filled 2022-09-11: qty 250

## 2022-09-11 MED ORDER — OXYTOCIN-SODIUM CHLORIDE 30-0.9 UT/500ML-% IV SOLN
INTRAVENOUS | Status: DC | PRN
  Administered 2022-09-11: 30 mL via INTRAVENOUS

## 2022-09-11 MED ORDER — DEXAMETHASONE SODIUM PHOSPHATE 10 MG/ML IJ SOLN
INTRAMUSCULAR | Status: DC | PRN
  Administered 2022-09-11: 10 mg via INTRAVENOUS

## 2022-09-11 MED ORDER — LABETALOL HCL 5 MG/ML IV SOLN
20.0000 mg | INTRAVENOUS | Status: DC | PRN
  Administered 2022-09-11 – 2022-09-13 (×3): 20 mg via INTRAVENOUS
  Filled 2022-09-11 (×2): qty 4

## 2022-09-11 MED ORDER — STERILE WATER FOR IRRIGATION IR SOLN
Status: DC | PRN
  Administered 2022-09-11: 1000 mL

## 2022-09-11 MED ORDER — MORPHINE SULFATE (PF) 0.5 MG/ML IJ SOLN
INTRAMUSCULAR | Status: AC
  Filled 2022-09-11: qty 10

## 2022-09-11 MED ORDER — HYDRALAZINE HCL 20 MG/ML IJ SOLN: 10.0000 mg | INTRAMUSCULAR | Status: DC | PRN

## 2022-09-11 MED ORDER — ONDANSETRON HCL 4 MG/2ML IJ SOLN
INTRAMUSCULAR | Status: DC | PRN
  Administered 2022-09-11: 4 mg via INTRAVENOUS

## 2022-09-11 MED ORDER — LABETALOL HCL 5 MG/ML IV SOLN: 80.0000 mg | INTRAVENOUS | Status: DC | PRN

## 2022-09-11 MED ORDER — MAGNESIUM SULFATE 40 GM/1000ML IV SOLN
2.0000 g/h | INTRAVENOUS | Status: DC
  Filled 2022-09-11: qty 1000

## 2022-09-11 MED ORDER — LACTATED RINGERS IV SOLN: INTRAVENOUS | Status: DC | PRN

## 2022-09-11 MED ORDER — EPHEDRINE SULFATE-NACL 50-0.9 MG/10ML-% IV SOSY
PREFILLED_SYRINGE | INTRAVENOUS | Status: DC | PRN
  Administered 2022-09-11: 5 mg via INTRAVENOUS

## 2022-09-11 MED ORDER — SOD CITRATE-CITRIC ACID 500-334 MG/5ML PO SOLN
30.0000 mL | Freq: Once | ORAL | Status: AC
  Administered 2022-09-11: 30 mL via ORAL
  Filled 2022-09-11: qty 30

## 2022-09-11 MED ORDER — ONDANSETRON HCL 4 MG/2ML IJ SOLN
INTRAMUSCULAR | Status: AC
  Filled 2022-09-11: qty 2

## 2022-09-11 MED ORDER — SCOPOLAMINE 1 MG/3DAYS TD PT72
MEDICATED_PATCH | TRANSDERMAL | Status: DC | PRN
  Administered 2022-09-11: 1 via TRANSDERMAL

## 2022-09-11 MED ORDER — FENTANYL CITRATE (PF) 100 MCG/2ML IJ SOLN
INTRAMUSCULAR | Status: AC
  Filled 2022-09-11: qty 2

## 2022-09-11 SURGICAL SUPPLY — 32 items
ADH SKN CLS APL DERMABOND .7 (GAUZE/BANDAGES/DRESSINGS) ×1
APL PRP STRL LF DISP 70% ISPRP (MISCELLANEOUS) ×2
CHLORAPREP W/TINT 26 (MISCELLANEOUS) ×4 IMPLANT
CLAMP UMBILICAL CORD (MISCELLANEOUS) ×2 IMPLANT
CLOTH BEACON ORANGE TIMEOUT ST (SAFETY) ×2 IMPLANT
DERMABOND ADVANCED .7 DNX12 (GAUZE/BANDAGES/DRESSINGS) ×4 IMPLANT
DRAPE SHEET LG 3/4 BI-LAMINATE (DRAPES) IMPLANT
DRSG OPSITE POSTOP 4X10 (GAUZE/BANDAGES/DRESSINGS) ×2 IMPLANT
ELECT REM PT RETURN 9FT ADLT (ELECTROSURGICAL) ×1
ELECTRODE REM PT RTRN 9FT ADLT (ELECTROSURGICAL) ×2 IMPLANT
GAUZE SPONGE 4X4 12PLY STRL LF (GAUZE/BANDAGES/DRESSINGS) IMPLANT
GLOVE BIOGEL PI IND STRL 7.0 (GLOVE) ×4 IMPLANT
GLOVE BIOGEL PI IND STRL 7.5 (GLOVE) ×4 IMPLANT
GLOVE ECLIPSE 7.5 STRL STRAW (GLOVE) ×2 IMPLANT
GOWN STRL REUS W/TWL LRG LVL3 (GOWN DISPOSABLE) ×6 IMPLANT
HEMOSTAT ARISTA ABSORB 3G PWDR (HEMOSTASIS) IMPLANT
NS IRRIG 1000ML POUR BTL (IV SOLUTION) ×2 IMPLANT
PACK C SECTION WH (CUSTOM PROCEDURE TRAY) ×2 IMPLANT
PAD OB MATERNITY 4.3X12.25 (PERSONAL CARE ITEMS) ×2 IMPLANT
RTRCTR C-SECT PINK 25CM LRG (MISCELLANEOUS) ×2 IMPLANT
SUT MNCRL 0 VIOLET CTX 36 (SUTURE) ×4 IMPLANT
SUT MON AB 3-0 SH 27 (SUTURE) ×1
SUT MON AB 3-0 SH27 (SUTURE) IMPLANT
SUT MONOCRYL 0 CTX 36 (SUTURE) ×4
SUT VIC AB 0 CTX 36 (SUTURE) ×1
SUT VIC AB 0 CTX36XBRD ANBCTRL (SUTURE) ×2 IMPLANT
SUT VIC AB 2-0 CT1 27 (SUTURE) ×1
SUT VIC AB 2-0 CT1 TAPERPNT 27 (SUTURE) ×2 IMPLANT
SUT VIC AB 4-0 KS 27 (SUTURE) ×2 IMPLANT
TOWEL OR 17X24 6PK STRL BLUE (TOWEL DISPOSABLE) ×2 IMPLANT
TRAY FOLEY W/BAG SLVR 14FR LF (SET/KITS/TRAYS/PACK) ×2 IMPLANT
WATER STERILE IRR 1000ML POUR (IV SOLUTION) ×2 IMPLANT

## 2022-09-11 NOTE — Consult Note (Incomplete)
NAME:  [name], MRN:  [id number], DOB:  02-16-1986, LOS: 0 ADMISSION DATE:  09/11/2022, CONSULTATION DATE:  3/26 REFERRING MD:  Dr Elonda Husky (OB), CHIEF COMPLAINT:  pre-eclampsia   History of Present Illness:  37 year old female with PMH as below, which is significant for moderate mitral regurgitation, HFrEF (LVEF 35-40%), and pre-eclampsia. She is followed by cardiology in the outpatient setting and is treated with metoprolol and ASA daily. She presented to [facility] hospital 3/26 as a [id number] with active IUP at [redacted]w[redacted]d for scheduled cesarean section. She was noted to be hypertensive with BP 161/100 and was started on magnesium and labetalol. Cesarean delivery was uncomplicated and she was transferred to ICU post-operatively for close monitoring. PCCM consulted to assist with ICU care.   Pertinent  Medical History   has a past medical history of Bacterial infection, History of chicken pox, and Systolic heart failure ([place]).   Significant Hospital Events: Including procedures, antibiotic start and stop dates in addition to other pertinent events     Interim History / Subjective:  ***  Objective   Blood pressure (!) 154/95, pulse 91, temperature 98.3 F (36.8 C), resp. rate 16, last menstrual period 01/02/2022, SpO2 99 %.       No intake or output data in the 24 hours ending 09/11/22 2340 There were no vitals filed for this visit.  Examination: General: *** HENT: *** Lungs: *** Cardiovascular: *** Abdomen: *** Extremities: *** Neuro: *** GU: ***  Resolved Hospital Problem list   ***  Assessment & Plan:  ***  Best Practice (right click and "Reselect all SmartList Selections" daily)   Diet/type: {diet type:25684} DVT prophylaxis: {anticoagulation (Optional):25687} GI prophylaxis: [id number] Lines: {Central Venous Access:25771} Foley:  {Central Venous Access:25691} Code Status:  {Code Status:26939} Last date of multidisciplinary goals of care discussion [***]  Labs    CBC: Recent Labs  Lab 09/11/22 2148  WBC 10.1  HGB 11.1*  HCT 33.5*  MCV 75.6*  PLT [id number]    Basic Metabolic Panel: Recent Labs  Lab 09/11/22 2148  NA 138  K 4.2  CL 104  CO2 22  GLUCOSE 89  BUN 13  CREATININE 0.71  CALCIUM 9.0   GFR: Estimated Creatinine Clearance: 92.4 mL/min (by C-G formula based on SCr of 0.71 mg/dL). Recent Labs  Lab 09/11/22 2148  WBC 10.1    Liver Function Tests: Recent Labs  Lab 09/11/22 2148  AST 44*  ALT 39  ALKPHOS 97  BILITOT 0.4  PROT 6.7  ALBUMIN 3.0*   No results for input(s): "LIPASE", "AMYLASE" in the last 168 hours. No results for input(s): "AMMONIA" in the last 168 hours.  ABG No results found for: "PHART", "PCO2ART", "PO2ART", "HCO3", "TCO2", "ACIDBASEDEF", "O2SAT"   Coagulation Profile: No results for input(s): "INR", "PROTIME" in the last 168 hours.  Cardiac Enzymes: No results for input(s): "CKTOTAL", "CKMB", "CKMBINDEX", "TROPONINI" in the last 168 hours.  HbA1C: No results found for: "HGBA1C"  CBG: No results for input(s): "GLUCAP" in the last 168 hours.  Review of Systems:   ***  Past Medical History:  She,  has a past medical history of Bacterial infection, History of chicken pox, and Systolic heart failure ([place]).   Surgical History:   Past Surgical History:  Procedure Laterality Date  . CESAREAN SECTION    . CESAREAN SECTION N/A 02/02/2015   Procedure: CESAREAN SECTION;  Surgeon: Thurnell Lose, MD;  Location: [place] [facility];  Service: Obstetrics;  Laterality: N/A;  . DILATION AND CURETTAGE OF UTERUS    .  WISDOM TOOTH EXTRACTION       Social History:   reports that she has never smoked. She has never used smokeless tobacco. She reports that she does not drink alcohol and does not use drugs.   Family History:  Her family history includes Diabetes in her father; Hypertension in her father, mother, and paternal aunt. There is no history of Asthma, Cancer, Heart disease, or Stroke.    Allergies Allergies  Allergen Reactions  . Other Other (See Comments)  . Penicillins Rash     Home Medications  Prior to Admission medications   Medication Sig Start Date End Date Taking? Authorizing Provider  metoprolol succinate (TOPROL XL) 25 MG 24 hr tablet Take 0.5 tablets (12.5 mg total) by mouth daily. 08/01/22  Yes Freada Bergeron, MD  aspirin (ASPIRIN CHILDRENS) 81 MG chewable tablet Chew 1 tablet (81 mg total) by mouth daily. 03/24/22   Rasch, Anderson Malta I, NP  ferrous sulfate 325 (65 FE) MG EC tablet Take 1 tablet (325 mg total) by mouth every other day. Patient not taking: Reported on 09/05/2022 08/30/22   Radene Gunning, MD  omeprazole (PRILOSEC OTC) 20 MG tablet Take 1 tablet (20 mg total) by mouth daily. Patient not taking: Reported on 09/05/2022 08/16/22   Radene Gunning, MD  Prenatal MV & Min w/FA-DHA ([facility] PRENATAL GUMMY) 0.18-25 MG CHEW Chew 1 tablet by mouth daily. 03/24/22   Rasch, Artist Pais, NP     Critical care time: ***

## 2022-09-11 NOTE — H&P (Cosign Needed Addendum)
Obstetric Preoperative History and Physical  [name] is a 37 y.o. [id number] with IUP at [redacted]w[redacted]d presenting for scheduled cesarean section.  Reports good fetal movement, no bleeding, no contractions, no leaking of fluid.  No acute preoperative concerns.    Cesarean Section Indication: patient declines vag del attempt, C/s x2, severe preEclampsia, Active labor  Prenatal Course Source of Care: [facility]  Pregnancy complications or risks: Patient Active Problem List   Diagnosis Date Noted   Anemia 07/28/2022   RED CHART ROUNDS 06/20/2022   Previous cesarean delivery affecting pregnancy [id number]   Systolic heart failure ([place]) 04/23/2022   Maternal mitral valve regurgitation affecting pregnancy in third trimester, antepartum 04/23/2022   Pyelonephritis affecting pregnancy in second trimester 04/22/2022   UTI in pregnancy 04/06/2022   Abnormal Pap smear of cervix 04/06/2022   History of gestational hypertension 03/24/2022   Multigravida of advanced maternal age in third trimester 03/24/2022   History of preterm delivery, currently pregnant 03/24/2022   Supervision of high risk pregnancy, antepartum 03/23/2022   She plans to breastfeed, plans to bottle feed She desires oral progesterone-only contraceptive, IUD for postpartum contraception.   Prenatal labs and studies: ABO, Rh: B/RH(D) POSITIVE/-- (10/13 0901) Antibody: NO ANTIBODIES DETECTED (10/13 0901) Rubella: 0.97 (10/13 0901) RPR: Non Reactive (02/09 0000)  HBsAg: NON-REACTIVE (10/13 0901)  HIV: Non Reactive (02/09 0000)  GBS:  1 hr Glucola  nml Genetic screening normal Anatomy US normal Est. FW: 1913 gm 4 lb 3 oz 31 %  on [redacted]w[redacted]d  Prenatal Transfer Tool  Maternal Diabetes: No Genetic Screening: Normal Maternal Ultrasounds/Referrals: Normal Fetal Ultrasounds or other Referrals:  Referred to Materal Fetal Medicine  Maternal Substance Abuse:  No Significant Maternal Medications:  None Significant Maternal Lab Results: Other:    Past Medical History:  Diagnosis Date   Bacterial infection    History of chicken pox    Systolic heart failure ([place])     Past Surgical History:  Procedure Laterality Date   CESAREAN SECTION     CESAREAN SECTION N/A 02/02/2015   Procedure: CESAREAN SECTION;  Surgeon: Thurnell Lose, MD;  Location: [facility];  Service: Obstetrics;  Laterality: N/A;   DILATION AND CURETTAGE OF UTERUS     WISDOM TOOTH EXTRACTION      OB History  Gravida Para Term Preterm AB Living  5 2 1 1 2 2   SAB IAB Ectopic Multiple Live Births  1 1   0 2    # Outcome Date GA Lbr Len/2nd Weight Sex Delivery Anes PTL Lv  5 Current           4 Preterm 02/02/15 [redacted]w[redacted]d  2520 g M CS-LVertical Spinal  LIV  3 SAB 2012          2 Term 2009    F CS-LTranv   LIV  1 IAB 2003            Social History   Socioeconomic History   Marital status: Single    Spouse name: Not on file   Number of children: Not on file   Years of education: Not on file   Highest education level: Not on file  Occupational History   Not on file  Tobacco Use   Smoking status: Never   Smokeless tobacco: Never  Vaping Use   Vaping Use: Never used  Substance and Sexual Activity   Alcohol use: No   Drug use: No   Sexual activity: Not Currently    Birth control/protection: None  Other  Topics Concern   Not on file  Social History Narrative   Not on file   Social Determinants of Health   Financial Resource Strain: Not on file  Food Insecurity: No Food Insecurity (04/22/2022)   Hunger Vital Sign    Worried About Running Out of Food in the Last Year: Never true    Ran Out of Food in the Last Year: Never true  Transportation Needs: No Transportation Needs (04/22/2022)   PRAPARE - Hydrologist (Medical): No    Lack of Transportation (Non-Medical): No  Physical Activity: Not on file  Stress: Not on file  Social Connections: Not on file    Family History  Problem Relation Age of Onset   Hypertension Mother     Hypertension Father    Diabetes Father    Hypertension Paternal Aunt    Asthma Neg Hx    Cancer Neg Hx    Heart disease Neg Hx    Stroke Neg Hx     Medications Prior to Admission  Medication Sig Dispense Refill Last Dose   metoprolol succinate (TOPROL XL) 25 MG 24 hr tablet Take 0.5 tablets (12.5 mg total) by mouth daily. 45 tablet 3 09/10/2022   aspirin (ASPIRIN CHILDRENS) 81 MG chewable tablet Chew 1 tablet (81 mg total) by mouth daily. 90 tablet 1    ferrous sulfate 325 (65 FE) MG EC tablet Take 1 tablet (325 mg total) by mouth every other day. (Patient not taking: Reported on 09/05/2022) 60 tablet 1    omeprazole (PRILOSEC OTC) 20 MG tablet Take 1 tablet (20 mg total) by mouth daily. (Patient not taking: Reported on 09/05/2022) 30 tablet 3    Prenatal MV & Min w/FA-DHA ([facility] PRENATAL GUMMY) 0.18-25 MG CHEW Chew 1 tablet by mouth daily. 30 tablet 3     Allergies  Allergen Reactions   Other Other (See Comments)   Penicillins Rash    Review of Systems: Pertinent items noted in HPI and remainder of comprehensive ROS otherwise negative.  Physical Exam: BP (!) 161/101   Pulse 95   Temp 98.4 F (36.9 C)   Resp 12   LMP 01/02/2022   SpO2 99%  FHR by Doppler: 145 bpm CONSTITUTIONAL: Well-developed, well-nourished female in no acute distress.  HENT:  Normocephalic, atraumatic, External right and left ear normal. Oropharynx is clear and moist EYES: Conjunctivae and EOM are normal. Pupils are equal, round, and reactive to light. No scleral icterus.  NECK: Normal range of motion, supple, no masses SKIN: Skin is warm and dry. No rash noted. Not diaphoretic. No erythema. No pallor. NEUROLOGIC: Alert and oriented to person, place, and time. Normal reflexes, muscle tone coordination. No cranial nerve deficit noted. PSYCHIATRIC: Normal mood and affect. Normal behavior. Normal judgment and thought content. CARDIOVASCULAR: Normal heart rate noted, regular rhythm RESPIRATORY: Effort and  breath sounds normal, no problems with respiration noted ABDOMEN: Soft, nontender, nondistended, gravid. Well-healed Pfannenstiel incision. PELVIC: Deferred MUSCULOSKELETAL: Normal range of motion. No edema and no tenderness. 2+ distal pulses.  Pertinent Labs/Studies:   No results found for this or any previous visit (from the past 72 hour(s)).  Assessment and Plan: [name] is a 37 y.o. [id number] at [redacted]w[redacted]d being admitted for scheduled cesarean section. The risks of surgery were discussed with the patient including but were not limited to: bleeding which may require transfusion or reoperation; infection which may require antibiotics; injury to bowel, bladder, ureters or other surrounding organs; injury to the fetus;  need for additional procedures including hysterectomy in the event of a life-threatening hemorrhage; formation of adhesions; placental abnormalities wth subsequent pregnancies; incisional problems; thromboembolic phenomenon and other postoperative/anesthesia complications. The patient concurred with the proposed plan, giving informed written consent for the procedure. Patient has been NPO since 1800 she will remain NPO for procedure. Anesthesia and OR aware. Preoperative prophylactic antibiotics and SCDs ordered on call to the OR. To OR when ready.   Pre-Eclampsia with SF: BP severe, no other sx, has HFrEF (EF 35-40%) on 3/22, mitral valve MR, G1DD.   - mag bolus and gtt, IV labetolol given x1, protocol I'm place.   HFrEF: (EF 35-40%) on 3/22, mitral valve MR, G1DD.   - cont BB, pp will add [place], [place] Fellow, Faculty practice [place] for [place] 09/11/22  9:48 PM

## 2022-09-11 NOTE — Anesthesia Preprocedure Evaluation (Signed)
Anesthesia Evaluation  Patient identified by MRN, date of birth, ID band Patient awake    Reviewed: Allergy & Precautions, H&P , NPO status , Patient's Chart, lab work & pertinent test results, reviewed documented beta blocker date and time   Airway Mallampati: II  TM Distance: >3 FB Neck ROM: Full    Dental  (+) Teeth Intact, Dental Advisory Given   Pulmonary neg pulmonary ROS   Pulmonary exam normal breath sounds clear to auscultation       Cardiovascular hypertension, Pt. on medications and Pt. on home beta blockers +CHF (LVEF 123456, grade 1 diastolic dysfunction)  Normal cardiovascular exam+ Valvular Problems/Murmurs (mod-severe MR) MR  Rhythm:Regular Rate:Normal  Echo 2024  1. Left ventricular ejection fraction, by estimation, is 35 to 40%. The  left ventricle has moderately decreased function. The left ventricle  demonstrates global hypokinesis. Left ventricular diastolic parameters are  consistent with Grade I diastolic  dysfunction (impaired relaxation). The average left ventricular global  longitudinal strain is -12.0 %. The global longitudinal strain is  abnormal.   2. Right ventricular systolic function is normal. The right ventricular  size is normal. There is normal pulmonary artery systolic pressure.   3. The mitral valve is normal in structure. Moderate mitral valve  regurgitation. No evidence of mitral stenosis.   4. The aortic valve is normal in structure. Aortic valve regurgitation is  not visualized. No aortic stenosis is present.   5. The inferior vena cava is normal in size with greater than 50%  respiratory variability, suggesting right atrial pressure of 3 mmHg.      Neuro/Psych negative neurological ROS  negative psych ROS   GI/Hepatic negative GI ROS, Neg liver ROS,,,  Endo/Other  negative endocrine ROS    Renal/GU negative Renal ROS  negative genitourinary   Musculoskeletal negative  musculoskeletal ROS (+)    Abdominal   Peds negative pediatric ROS (+)  Hematology negative hematology ROS (+)   Anesthesia Other Findings   Reproductive/Obstetrics negative OB ROS                             Anesthesia Physical Anesthesia Plan  ASA: 4 and emergent  Anesthesia Plan: Combined Spinal and Epidural   Post-op Pain Management: Regional block, Ofirmev IV (intra-op)* and Toradol IV (intra-op)*   Induction:   PONV Risk Score and Plan: 3 and Ondansetron, Dexamethasone and Treatment may vary due to age or medical condition  Airway Management Planned: Natural Airway and Nasal Cannula  Additional Equipment: Arterial line  Intra-op Plan:   Post-operative Plan:   Informed Consent: I have reviewed the patients History and Physical, chart, labs and discussed the procedure including the risks, benefits and alternatives for the proposed anesthesia with the patient or authorized representative who has indicated his/her understanding and acceptance.       Plan Discussed with: CRNA  Anesthesia Plan Comments: (High risk for periop complications d/t reduced EF and mod-severe MR which is new this pregnancy. Was seen by cardiology 08/24/22 and was felt to be well compensated, no fluid overload issues at the time. I would agree that she still appears well compensated. Per cards, valve leaflets appear to be thickened raising concern for inflammatory etiology but she is felt to be relatively chronic as far as her HF. Will avoid phenylephrine- pressor of choice ephedrine, will have dobutamine available in room. Arterial line and additional IV access to be placed before CSE.)  Anesthesia Quick Evaluation  

## 2022-09-11 NOTE — Consult Note (Signed)
NAME:  [name], MRN:  [id number], DOB:  [birth year], LOS: 0 ADMISSION DATE:  09/11/2022, CONSULTATION DATE:  3/26 REFERRING MD:  Dr Elonda Husky (OB), CHIEF COMPLAINT:  pre-eclampsia   History of Present Illness:  37 year old female with PMH as below, which is significant for moderate mitral regurgitation, HFrEF (LVEF 35-40%), and pre-eclampsia. She is followed by cardiology in the outpatient setting and is treated with metoprolol and ASA daily. There was a scheduled cesarean planned for 4/2. She presented to [facility] hospital 3/26 as a [id number] with active IUP at [redacted]w[redacted]d in active labor. She was noted to be hypertensive with BP 161/100 and was started on magnesium and labetalol. She was taken for cesarean delivery, which was uncomplicated and she was transferred to ICU post-operatively for close monitoring. PCCM consulted to assist with ICU care.   Pertinent  Medical History   has a past medical history of Bacterial infection, History of chicken pox, and Systolic heart failure ([place]).   Significant Hospital Events: Including procedures, antibiotic start and stop dates in addition to other pertinent events   3/26 admit in active labor, taken for cesarean. To ICU post-op for close monitoring.   Interim History / Subjective:    Objective   Blood pressure (!) 154/95, pulse 91, temperature 98.3 F (36.8 C), resp. rate 16, last menstrual period 01/02/2022, SpO2 99 %.       No intake or output data in the 24 hours ending 09/11/22 2340 There were no vitals filed for this visit.  Examination: General: young adult female in NAD HENT: [place]/AT, PERRL, no JVD Lungs: Clear bilateral breath sounds Cardiovascular: RRR, no MRG Abdomen: Surgical dressing in place.  Extremities: No acute deformity. Still numb from epidural. No edema.  Neuro: Alert, oriented, non-focal. GU: Foley  Resolved Hospital Problem list     Assessment & Plan:   Pre-eclampsia with severe features: s/p cesarean delivery 3/27  early AM.  - Mag infusion @ 2 g/hr - Keep SBP < 160 mmHg and DBP < 100 mmHg - PRN labetalol and hydralazine - Oxytocin per OB  Chronic HFrEF (LVEF 35-40%) Mod - Severe MR - Continue scheduled metoprolol and ASA - Lasix x 2 per OB - Decrease IVF infusion rate - If course remains uncomplicated can follow up with cardiology outpatient, otherwise would not hesitate to consult cardiology.   Post partum pain management - ketorolac, gabapentin  Best Practice (right click and "Reselect all SmartList Selections" daily)   Diet/type: Regular consistency (see orders) advance as tolerated DVT prophylaxis: LMWH GI prophylaxis: N/A Lines: Arterial Line Foley:  Yes, and it is still needed Code Status:  full code Last date of multidisciplinary goals of care discussion [ ]   Labs   CBC: Recent Labs  Lab 09/11/22 2148  WBC 10.1  HGB 11.1*  HCT 33.5*  MCV 75.6*  PLT 123456    Basic Metabolic Panel: Recent Labs  Lab 09/11/22 2148  NA 138  K 4.2  CL 104  CO2 22  GLUCOSE 89  BUN 13  CREATININE 0.71  CALCIUM 9.0   GFR: Estimated Creatinine Clearance: 92.4 mL/min (by C-G formula based on SCr of 0.71 mg/dL). Recent Labs  Lab 09/11/22 2148  WBC 10.1    Liver Function Tests: Recent Labs  Lab 09/11/22 2148  AST 44*  ALT 39  ALKPHOS 97  BILITOT 0.4  PROT 6.7  ALBUMIN 3.0*   No results for input(s): "LIPASE", "AMYLASE" in the last 168 hours. No results for input(s): "AMMONIA" in  the last 168 hours.  ABG No results found for: "PHART", "PCO2ART", "PO2ART", "HCO3", "TCO2", "ACIDBASEDEF", "O2SAT"   Coagulation Profile: No results for input(s): "INR", "PROTIME" in the last 168 hours.  Cardiac Enzymes: No results for input(s): "CKTOTAL", "CKMB", "CKMBINDEX", "TROPONINI" in the last 168 hours.  HbA1C: No results found for: "HGBA1C"  CBG: No results for input(s): "GLUCAP" in the last 168 hours.  Review of Systems:   Bolds are positive  Constitutional: weight loss, gain,  night sweats, Fevers, chills, fatigue .  HEENT: headaches, Sore throat, sneezing, nasal congestion, post nasal drip, Difficulty swallowing, Tooth/dental problems, visual complaints visual changes, ear ache CV:  chest pain, radiates:,Orthopnea, PND, swelling in lower extremities, dizziness, palpitations, syncope.  GI  heartburn, indigestion, abdominal pain, nausea, vomiting, diarrhea, change in bowel habits, loss of appetite, bloody stools.  Resp: cough, productive: , hemoptysis, dyspnea, chest pain, pleuritic.  Skin: rash or itching or icterus GU: dysuria, change in color of urine, urgency or frequency. flank pain, hematuria  MS: joint pain or swelling. decreased range of motion  Psych: change in mood or affect. depression or anxiety.  Neuro: difficulty with speech, weakness, numbness, ataxia    Past Medical History:  She,  has a past medical history of Bacterial infection, History of chicken pox, and Systolic heart failure ([place]).   Surgical History:   Past Surgical History:  Procedure Laterality Date   CESAREAN SECTION     CESAREAN SECTION N/A 02/02/2015   Procedure: CESAREAN SECTION;  Surgeon: Thurnell Lose, MD;  Location: [facility];  Service: Obstetrics;  Laterality: N/A;   DILATION AND CURETTAGE OF UTERUS     WISDOM TOOTH EXTRACTION       Social History:   reports that she has never smoked. She has never used smokeless tobacco. She reports that she does not drink alcohol and does not use drugs.   Family History:  Her family history includes Diabetes in her father; Hypertension in her father, mother, and paternal aunt. There is no history of Asthma, Cancer, Heart disease, or Stroke.   Allergies Allergies  Allergen Reactions   Other Other (See Comments)   Penicillins Rash     Home Medications  Prior to Admission medications   Medication Sig Start Date End Date Taking? Authorizing Provider  metoprolol succinate (TOPROL XL) 25 MG 24 hr tablet Take 0.5 tablets (12.5 mg total) by  mouth daily. 08/01/22  Yes Freada Bergeron, MD  aspirin (ASPIRIN CHILDRENS) 81 MG chewable tablet Chew 1 tablet (81 mg total) by mouth daily. 03/24/22   Rasch, Anderson Malta I, NP  ferrous sulfate 325 (65 FE) MG EC tablet Take 1 tablet (325 mg total) by mouth every other day. Patient not taking: Reported on 09/05/2022 08/30/22   Radene Gunning, MD  omeprazole (PRILOSEC OTC) 20 MG tablet Take 1 tablet (20 mg total) by mouth daily. Patient not taking: Reported on 09/05/2022 08/16/22   Radene Gunning, MD  Prenatal MV & Min w/FA-DHA ([facility] PRENATAL GUMMY) 0.18-25 MG CHEW Chew 1 tablet by mouth daily. 03/24/22   Rasch, Artist Pais, NP     Critical care time:       Georgann Housekeeper, AGACNP-BC [facility]  See Amion for personal pager PCCM on call pager [phone] until 7pm. Please call Elink 7p-7a. [phone]  09/12/2022 1:40 AM

## 2022-09-11 NOTE — MAU Note (Signed)
.  [name] is a 37 y.o. at [redacted]w[redacted]d here in MAU reporting: Pt reports ctx's that started this morning, but increased around 1800.  Denies vaginal bleeding. Denies LOF.  Reports +FM   Onset of complaint: Today  Pain score: 7/10 There were no vitals filed for this visit.   Lab orders placed from triage:   none

## 2022-09-11 NOTE — Anesthesia Preprocedure Evaluation (Incomplete)
Anesthesia Evaluation    Airway        Dental   Pulmonary           Cardiovascular      Neuro/Psych    GI/Hepatic   Endo/Other    Renal/GU      Musculoskeletal   Abdominal   Peds  Hematology   Anesthesia Other Findings   Reproductive/Obstetrics                             Anesthesia Physical Anesthesia Plan Anesthesia Quick Evaluation  

## 2022-09-12 ENCOUNTER — Encounter (HOSPITAL_COMMUNITY): Payer: Self-pay | Admitting: Obstetrics & Gynecology

## 2022-09-12 ENCOUNTER — Other Ambulatory Visit: Payer: Self-pay

## 2022-09-12 ENCOUNTER — Inpatient Hospital Stay (HOSPITAL_COMMUNITY): Payer: Medicaid Other

## 2022-09-12 ENCOUNTER — Telehealth: Payer: Self-pay | Admitting: *Deleted

## 2022-09-12 DIAGNOSIS — R9431 Abnormal electrocardiogram [ECG] [EKG]: Secondary | ICD-10-CM | POA: Diagnosis not present

## 2022-09-12 DIAGNOSIS — O1493 Unspecified pre-eclampsia, third trimester: Secondary | ICD-10-CM | POA: Diagnosis not present

## 2022-09-12 DIAGNOSIS — Z98891 History of uterine scar from previous surgery: Secondary | ICD-10-CM | POA: Diagnosis not present

## 2022-09-12 DIAGNOSIS — T148XXA Other injury of unspecified body region, initial encounter: Secondary | ICD-10-CM

## 2022-09-12 DIAGNOSIS — I519 Heart disease, unspecified: Secondary | ICD-10-CM

## 2022-09-12 DIAGNOSIS — O1490 Unspecified pre-eclampsia, unspecified trimester: Secondary | ICD-10-CM

## 2022-09-12 DIAGNOSIS — I428 Other cardiomyopathies: Secondary | ICD-10-CM

## 2022-09-12 DIAGNOSIS — Z3A36 36 weeks gestation of pregnancy: Secondary | ICD-10-CM | POA: Diagnosis not present

## 2022-09-12 DIAGNOSIS — T8119XA Other postprocedural shock, initial encounter: Secondary | ICD-10-CM

## 2022-09-12 LAB — PREPARE RBC (CROSSMATCH)

## 2022-09-12 LAB — DIC (DISSEMINATED INTRAVASCULAR COAGULATION)PANEL
D-Dimer, Quant: 5.77 ug/mL-FEU — ABNORMAL HIGH (ref 0.00–0.50)
Fibrinogen: 312 mg/dL (ref 210–475)
INR: 1.1 (ref 0.8–1.2)
Platelets: 278 10*3/uL (ref 150–400)
Prothrombin Time: 14.6 seconds (ref 11.4–15.2)
Smear Review: NONE SEEN
aPTT: 26 seconds (ref 24–36)

## 2022-09-12 LAB — CBC
HCT: 24.1 % — ABNORMAL LOW (ref 36.0–46.0)
HCT: 24 % — ABNORMAL LOW (ref 36.0–46.0)
Hemoglobin: 8.3 g/dL — ABNORMAL LOW (ref 12.0–15.0)
Hemoglobin: 8.2 g/dL — ABNORMAL LOW (ref 12.0–15.0)
MCH: 25.9 pg — ABNORMAL LOW (ref 26.0–34.0)
MCH: 27.3 pg (ref 26.0–34.0)
MCHC: 34.4 g/dL (ref 30.0–36.0)
MCHC: 34.2 g/dL (ref 30.0–36.0)
MCV: 75.1 fL — ABNORMAL LOW (ref 80.0–100.0)
MCV: 80 fL (ref 80.0–100.0)
Platelets: 297 10*3/uL (ref 150–400)
Platelets: 154 10*3/uL (ref 150–400)
RBC: 3.21 MIL/uL — ABNORMAL LOW (ref 3.87–5.11)
RBC: 3 MIL/uL — ABNORMAL LOW (ref 3.87–5.11)
RDW: 13.9 % (ref 11.5–15.5)
RDW: 14.4 % (ref 11.5–15.5)
WBC: 16.1 10*3/uL — ABNORMAL HIGH (ref 4.0–10.5)
WBC: 12.6 10*3/uL — ABNORMAL HIGH (ref 4.0–10.5)
nRBC: 0 % (ref 0.0–0.2)
nRBC: 0 % (ref 0.0–0.2)

## 2022-09-12 LAB — PROTIME-INR
INR: 1.1 (ref 0.8–1.2)
Prothrombin Time: 13.6 seconds (ref 11.4–15.2)

## 2022-09-12 LAB — RPR: RPR Ser Ql: NONREACTIVE

## 2022-09-12 LAB — ECHOCARDIOGRAM LIMITED
Area-P 1/2: 3.89 cm2
Calc EF: 15.1 %
Height: 64 in
MV M vel: 4.4 m/s
MV Peak grad: 77.4 mmHg
S' Lateral: 4.3 cm
Single Plane A2C EF: 17.2 %
Single Plane A4C EF: 24.9 %
Weight: 2335.11 oz

## 2022-09-12 LAB — BLOOD GAS, VENOUS
Acid-base deficit: 6.5 mmol/L — ABNORMAL HIGH (ref 0.0–2.0)
Bicarbonate: 19.7 mmol/L — ABNORMAL LOW (ref 20.0–28.0)
O2 Saturation: 34.9 %
Patient temperature: 37
pCO2, Ven: 41 mmHg — ABNORMAL LOW (ref 44–60)
pH, Ven: 7.29 (ref 7.25–7.43)
pO2, Ven: <31 mmHg — CL (ref 32–45)

## 2022-09-12 LAB — COMPREHENSIVE METABOLIC PANEL
ALT: 31 U/L (ref 0–44)
AST: 61 U/L — ABNORMAL HIGH (ref 15–41)
Albumin: 2.7 g/dL — ABNORMAL LOW (ref 3.5–5.0)
Alkaline Phosphatase: 58 U/L (ref 38–126)
Anion gap: 12 (ref 5–15)
BUN: 18 mg/dL (ref 6–20)
CO2: 19 mmol/L — ABNORMAL LOW (ref 22–32)
Calcium: 7 mg/dL — ABNORMAL LOW (ref 8.9–10.3)
Chloride: 101 mmol/L (ref 98–111)
Creatinine, Ser: 1.04 mg/dL — ABNORMAL HIGH (ref 0.44–1.00)
GFR, Estimated: >60 mL/min (ref >60)
Glucose, Bld: 117 mg/dL — ABNORMAL HIGH (ref 70–99)
Potassium: 5.1 mmol/L (ref 3.5–5.1)
Sodium: 132 mmol/L — ABNORMAL LOW (ref 135–145)
Total Bilirubin: 1.5 mg/dL — ABNORMAL HIGH (ref 0.3–1.2)
Total Protein: 5.4 g/dL — ABNORMAL LOW (ref 6.5–8.1)

## 2022-09-12 LAB — TROPONIN I (HIGH SENSITIVITY)
Troponin I (High Sensitivity): 19 ng/L — ABNORMAL HIGH (ref <18)
Troponin I (High Sensitivity): 16 ng/L (ref <18)

## 2022-09-12 LAB — MRSA NEXT GEN BY PCR, NASAL: MRSA by PCR Next Gen: NOT DETECTED

## 2022-09-12 LAB — HEMOGLOBIN AND HEMATOCRIT, BLOOD
HCT: 23.8 % — ABNORMAL LOW (ref 36.0–46.0)
Hemoglobin: 8.1 g/dL — ABNORMAL LOW (ref 12.0–15.0)

## 2022-09-12 LAB — LACTIC ACID, PLASMA
Lactic Acid, Venous: 5.5 mmol/L (ref 0.5–1.9)
Lactic Acid, Venous: 3.8 mmol/L (ref 0.5–1.9)
Lactic Acid, Venous: 4.1 mmol/L (ref 0.5–1.9)

## 2022-09-12 LAB — GLUCOSE, CAPILLARY: Glucose-Capillary: 123 mg/dL — ABNORMAL HIGH (ref 70–99)

## 2022-09-12 LAB — PROTEIN / CREATININE RATIO, URINE
Creatinine, Urine: 77 mg/dL
Protein Creatinine Ratio: 0.27 mg/mg{Cre} — ABNORMAL HIGH (ref 0.00–0.15)
Total Protein, Urine: 21 mg/dL

## 2022-09-12 MED ORDER — ALBUMIN HUMAN 5 % IV SOLN
25.0000 g | Freq: Once | INTRAVENOUS | Status: AC
  Administered 2022-09-12: 25 g via INTRAVENOUS
  Filled 2022-09-12: qty 500

## 2022-09-12 MED ORDER — ORAL CARE MOUTH RINSE: 15.0000 mL | OROMUCOSAL | Status: DC | PRN

## 2022-09-12 MED ORDER — SIMETHICONE 80 MG PO CHEW
80.0000 mg | CHEWABLE_TABLET | ORAL | Status: DC | PRN
  Administered 2022-09-15 (×2): 80 mg via ORAL
  Filled 2022-09-12 (×2): qty 1

## 2022-09-12 MED ORDER — MENTHOL 3 MG MT LOZG: 1.0000 | LOZENGE | OROMUCOSAL | Status: DC | PRN

## 2022-09-12 MED ORDER — DIBUCAINE (PERIANAL) 1 % EX OINT: 1.0000 | TOPICAL_OINTMENT | CUTANEOUS | Status: DC | PRN

## 2022-09-12 MED ORDER — OXYCODONE HCL 5 MG PO TABS
5.0000 mg | ORAL_TABLET | ORAL | Status: DC | PRN
  Administered 2022-09-12 – 2022-09-14 (×4): 5 mg via ORAL
  Administered 2022-09-14 – 2022-09-16 (×2): 10 mg via ORAL
  Administered 2022-09-17: 5 mg via ORAL
  Filled 2022-09-12: qty 2
  Filled 2022-09-12: qty 1
  Filled 2022-09-12: qty 2
  Filled 2022-09-12 (×2): qty 1
  Filled 2022-09-12: qty 2
  Filled 2022-09-12 (×2): qty 1

## 2022-09-12 MED ORDER — ASPIRIN 81 MG PO CHEW: 81.0000 mg | CHEWABLE_TABLET | Freq: Every day | ORAL | Status: DC

## 2022-09-12 MED ORDER — OXYCODONE HCL 5 MG PO TABS
5.0000 mg | ORAL_TABLET | Freq: Four times a day (QID) | ORAL | Status: DC | PRN
  Filled 2022-09-12: qty 1

## 2022-09-12 MED ORDER — ACETAMINOPHEN 500 MG PO TABS
1000.0000 mg | ORAL_TABLET | Freq: Four times a day (QID) | ORAL | Status: DC
  Administered 2022-09-12 – 2022-09-14 (×8): 1000 mg via ORAL
  Filled 2022-09-12 (×9): qty 2

## 2022-09-12 MED ORDER — IBUPROFEN 200 MG PO TABS
600.0000 mg | ORAL_TABLET | Freq: Four times a day (QID) | ORAL | Status: DC
  Administered 2022-09-13: 600 mg via ORAL
  Filled 2022-09-12: qty 3

## 2022-09-12 MED ORDER — METOPROLOL SUCCINATE 12.5 MG HALF TABLET
12.5000 mg | ORAL_TABLET | Freq: Every day | ORAL | Status: DC
  Filled 2022-09-12: qty 1

## 2022-09-12 MED ORDER — SENNOSIDES-DOCUSATE SODIUM 8.6-50 MG PO TABS
2.0000 | ORAL_TABLET | Freq: Every day | ORAL | Status: DC
  Administered 2022-09-13 – 2022-09-17 (×4): 2 via ORAL
  Filled 2022-09-12 (×6): qty 2

## 2022-09-12 MED ORDER — FUROSEMIDE 10 MG/ML IJ SOLN: 40.0000 mg | Freq: Two times a day (BID) | INTRAMUSCULAR | Status: DC

## 2022-09-12 MED ORDER — CALCIUM GLUCONATE-NACL 2-0.675 GM/100ML-% IV SOLN
2.0000 g | Freq: Once | INTRAVENOUS | Status: AC
  Administered 2022-09-12: 2000 mg via INTRAVENOUS
  Filled 2022-09-12: qty 100

## 2022-09-12 MED ORDER — LEVETIRACETAM IN NACL 500 MG/100ML IV SOLN
500.0000 mg | Freq: Two times a day (BID) | INTRAVENOUS | Status: AC
  Administered 2022-09-12 – 2022-09-13 (×4): 500 mg via INTRAVENOUS
  Filled 2022-09-12 (×4): qty 100

## 2022-09-12 MED ORDER — PRENATAL MULTIVITAMIN CH
1.0000 | ORAL_TABLET | Freq: Every day | ORAL | Status: DC
  Administered 2022-09-12 – 2022-09-17 (×6): 1 via ORAL
  Filled 2022-09-12 (×7): qty 1

## 2022-09-12 MED ORDER — LIDOCAINE HCL (PF) 1 % IJ SOLN
INTRAMUSCULAR | Status: AC
  Filled 2022-09-12: qty 5

## 2022-09-12 MED ORDER — SODIUM CHLORIDE 0.9 % IV SOLN
2.0000 g | Freq: Three times a day (TID) | INTRAVENOUS | Status: DC
  Administered 2022-09-12 – 2022-09-14 (×5): 2 g via INTRAVENOUS
  Filled 2022-09-12 (×5): qty 12.5

## 2022-09-12 MED ORDER — CHEWING GUM (ORBIT) SUGAR FREE
1.0000 | CHEWING_GUM | ORAL | Status: DC | PRN
  Filled 2022-09-12: qty 1

## 2022-09-12 MED ORDER — MAGNESIUM HYDROXIDE 400 MG/5ML PO SUSP: 30.0000 mL | ORAL | Status: DC | PRN

## 2022-09-12 MED ORDER — DIPHENHYDRAMINE HCL 25 MG PO CAPS
25.0000 mg | ORAL_CAPSULE | Freq: Four times a day (QID) | ORAL | Status: DC | PRN
  Administered 2022-09-12: 25 mg via ORAL
  Filled 2022-09-12: qty 1

## 2022-09-12 MED ORDER — LACTATED RINGERS IV BOLUS
250.0000 mL | Freq: Once | INTRAVENOUS | Status: AC
  Administered 2022-09-12: 250 mL via INTRAVENOUS

## 2022-09-12 MED ORDER — PERFLUTREN LIPID MICROSPHERE
1.0000 mL | INTRAVENOUS | Status: AC | PRN
  Administered 2022-09-12: 4 mL via INTRAVENOUS

## 2022-09-12 MED ORDER — HYDROMORPHONE HCL 1 MG/ML IJ SOLN: 0.5000 mg | INTRAMUSCULAR | Status: DC | PRN

## 2022-09-12 MED ORDER — ACETAMINOPHEN 325 MG PO TABS: 650.0000 mg | ORAL_TABLET | Freq: Once | ORAL | Status: DC

## 2022-09-12 MED ORDER — LACTATED RINGERS IV SOLN: INTRAVENOUS | Status: DC

## 2022-09-12 MED ORDER — SODIUM CHLORIDE 0.9% IV SOLUTION: Freq: Once | INTRAVENOUS | Status: AC

## 2022-09-12 MED ORDER — CHLORHEXIDINE GLUCONATE CLOTH 2 % EX PADS
6.0000 | MEDICATED_PAD | Freq: Every day | CUTANEOUS | Status: DC
  Administered 2022-09-12 – 2022-09-13 (×2): 6 via TOPICAL

## 2022-09-12 MED ORDER — COCONUT OIL OIL: 1.0000 | TOPICAL_OIL | Status: DC | PRN

## 2022-09-12 MED ORDER — PROMETHAZINE HCL 25 MG/ML IJ SOLN
INTRAMUSCULAR | Status: AC
  Filled 2022-09-12: qty 1

## 2022-09-12 MED ORDER — KETOROLAC TROMETHAMINE 30 MG/ML IJ SOLN
30.0000 mg | Freq: Four times a day (QID) | INTRAMUSCULAR | Status: AC
  Administered 2022-09-12 (×2): 30 mg via INTRAVENOUS
  Filled 2022-09-12 (×2): qty 1

## 2022-09-12 MED ORDER — ENOXAPARIN SODIUM 40 MG/0.4ML IJ SOSY: 40.0000 mg | PREFILLED_SYRINGE | Freq: Every day | INTRAMUSCULAR | Status: DC

## 2022-09-12 MED ORDER — ACETAMINOPHEN 10 MG/ML IV SOLN
INTRAVENOUS | Status: DC | PRN
  Administered 2022-09-12: 1000 mg via INTRAVENOUS

## 2022-09-12 MED ORDER — VANCOMYCIN HCL 1500 MG/300ML IV SOLN
1500.0000 mg | Freq: Once | INTRAVENOUS | Status: AC
  Administered 2022-09-12: 1500 mg via INTRAVENOUS
  Filled 2022-09-12: qty 300

## 2022-09-12 MED ORDER — OXYTOCIN-SODIUM CHLORIDE 30-0.9 UT/500ML-% IV SOLN
2.5000 [IU]/h | INTRAVENOUS | Status: AC
  Administered 2022-09-12 – 2022-09-13 (×3): 2.5 [IU]/h via INTRAVENOUS
  Filled 2022-09-12: qty 500
  Filled 2022-09-12: qty 1000
  Filled 2022-09-12: qty 500

## 2022-09-12 MED ORDER — ZOLPIDEM TARTRATE 5 MG PO TABS: 5.0000 mg | ORAL_TABLET | Freq: Every evening | ORAL | Status: DC | PRN

## 2022-09-12 MED ORDER — NOREPINEPHRINE 4 MG/250ML-% IV SOLN: 2.0000 ug/min | INTRAVENOUS | Status: DC

## 2022-09-12 MED ORDER — PROMETHAZINE HCL 25 MG/ML IJ SOLN
INTRAMUSCULAR | Status: DC | PRN
  Administered 2022-09-12: 12.5 mg via INTRAVENOUS

## 2022-09-12 MED ORDER — NOREPINEPHRINE 4 MG/250ML-% IV SOLN
INTRAVENOUS | Status: AC
  Filled 2022-09-12: qty 250

## 2022-09-12 MED ORDER — LACTATED RINGERS IV BOLUS: 250.0000 mL | Freq: Once | INTRAVENOUS | Status: DC

## 2022-09-12 MED ORDER — WITCH HAZEL-GLYCERIN EX PADS: 1.0000 | MEDICATED_PAD | CUTANEOUS | Status: DC | PRN

## 2022-09-12 MED ORDER — SODIUM CHLORIDE 0.9 % IV SOLN
250.0000 mL | INTRAVENOUS | Status: DC
  Administered 2022-09-12: 250 mL via INTRAVENOUS

## 2022-09-12 MED ORDER — VANCOMYCIN HCL 750 MG/150ML IV SOLN
750.0000 mg | Freq: Two times a day (BID) | INTRAVENOUS | Status: DC
  Administered 2022-09-13: 750 mg via INTRAVENOUS
  Filled 2022-09-12: qty 150

## 2022-09-12 MED ORDER — SIMETHICONE 80 MG PO CHEW
80.0000 mg | CHEWABLE_TABLET | Freq: Three times a day (TID) | ORAL | Status: DC
  Filled 2022-09-12 (×5): qty 1

## 2022-09-12 MED ORDER — GABAPENTIN 100 MG PO CAPS
200.0000 mg | ORAL_CAPSULE | Freq: Every day | ORAL | Status: DC
  Administered 2022-09-12 – 2022-09-13 (×2): 200 mg via ORAL
  Filled 2022-09-12 (×2): qty 2

## 2022-09-12 NOTE — Anesthesia Procedure Notes (Signed)
Epidural Patient location during procedure: OB Start time: 09/11/2022 11:15 PM End time: 09/11/2022 11:20 PM  Staffing Anesthesiologist: Pervis Hocking, DO Performed: anesthesiologist   Preanesthetic Checklist Completed: patient identified, IV checked, risks and benefits discussed, monitors and equipment checked, pre-op evaluation and timeout performed  Epidural Patient position: sitting Prep: DuraPrep and site prepped and draped Patient monitoring: continuous pulse ox, blood pressure, heart rate and cardiac monitor Approach: midline Location: L3-L4 Injection technique: LOR air  Needle:  Needle type: Tuohy  Needle gauge: 17 G Needle length: 9 cm Needle insertion depth: 5 cm Catheter type: closed end flexible Catheter size: 19 Gauge Catheter at skin depth: 10 cm Test dose: negative  Assessment Sensory level: T8 Events: blood not aspirated, no cerebrospinal fluid, injection not painful, no injection resistance, no paresthesia and negative IV test  Additional Notes Patient identified. Risks/Benefits/Options discussed with patient including but not limited to bleeding, infection, nerve damage, paralysis, failed block, incomplete pain control, headache, blood pressure changes, nausea, vomiting, reactions to medication both or allergic, itching and postpartum back pain. Confirmed with bedside nurse the patient's most recent platelet count. Confirmed with patient that they are not currently taking any anticoagulation, have any bleeding history or any family history of bleeding disorders. Patient expressed understanding and wished to proceed. All questions were answered. Sterile technique was used throughout the entire procedure. Please see nursing notes for vital signs. Test dose was given through epidural catheter and negative prior to continuing to dose epidural or start infusion. Warning signs of high block given to the patient including shortness of breath, tingling/numbness in  hands, complete motor block, or any concerning symptoms with instructions to call for help. Patient was given instructions on fall risk and not to get out of bed. All questions and concerns addressed with instructions to call with any issues or inadequate analgesia.     CSE performed for surgical anesthesia with 24G sprotte through tuohy, clear CSF no issues. Reason for block:procedure for pain

## 2022-09-12 NOTE — Discharge Summary (Signed)
Postpartum Discharge Summary      Patient Name: [name] DOB: 23-Jun-1985 MRN: [id number]  Date of admission: 09/11/2022 Delivery date:09/11/2022  Delivering provider: Florian Buff  Date of discharge: 09/17/2022  Admitting diagnosis: Status post repeat low transverse cesarean section [Z98.891] Intrauterine pregnancy: [redacted]w[redacted]d     Secondary diagnosis:  Principal Problem:   Status post repeat low transverse cesarean section Active Problems:   Supervision of high risk pregnancy, antepartum   History of gestational hypertension   Multigravida of advanced maternal age in third trimester   Systolic heart failure   Maternal mitral valve regurgitation affecting pregnancy in third trimester, antepartum   Previous cesarean delivery affecting pregnancy   Severe preeclampsia   Postoperative hemorrhagic shock   LV dysfunction     Discharge diagnosis: Preterm Pregnancy Delivered, Preeclampsia (severe), and HFrEF                                               Post partum procedures:blood transfusion due to acute blood loss anemia Augmentation: N/A Complications: None  Hospital course: Onset of Labor With Unplanned C/S   37 y.o. yo [id number] at [redacted]w[redacted]d was admitted in [place] on 09/11/2022. Patient had a labor course significant for active labor, planned repeat C/s. The patient went for cesarean section due to  planned repeat . Delivery details as follows: Membrane Rupture Time/Date: 11:47 PM ,09/11/2022   Delivery Method:C-Section, Low Transverse  Details of operation can be found in separate operative note. Patient had a postpartum course complicated by HFrEF, needing transfer to medical ICU for management and prevention of decompensation. Postpartum course also complicated by hypertension and elevated LFTs. She received magnesium sulfate and later keppra for seizure prophylaxis and BP better controlled on 3 agents. Patient's clinical status and labs improved.  She is ambulating,tolerating a  regular diet, passing flatus, and urinating well.  Patient is discharged home in stable condition 09/17/22.  Newborn Data: Birth date:09/11/2022  Birth time:11:49 PM  Gender:Female  Living status:Living  Apgars:8 ,9  Weight:2330 g   Magnesium Sulfate received: Yes: Seizure prophylaxis BMZ received: No Rhophylac:N/A MMR:N/A T-DaP: declined Flu: No Transfusion:Yes  Physical exam  Vitals:   09/16/22 1934 09/16/22 2344 09/17/22 0455 09/17/22 0757  BP: 123/89 135/88 132/86 (!) 131/97  Pulse: (!) 102 95 86 (!) 107  Resp: 19 18 18 18   Temp: 98.2 F (36.8 C) 98.1 F (36.7 C) 98 F (36.7 C) 98.1 F (36.7 C)  TempSrc: Oral Oral Oral Oral  SpO2: 100% 97% 98% 95%  Weight:      Height:       General: alert, cooperative, and no distress Lochia: appropriate Uterine Fundus: firm Incision: Dressing is clean, dry, and intact DVT Evaluation: No evidence of DVT seen on physical exam. Labs: Lab Results  Component Value Date   WBC 7.7 09/16/2022   HGB 12.2 09/16/2022   HCT 36.1 09/16/2022   MCV 80.8 09/16/2022   PLT 223 09/16/2022      Latest Ref Rng & Units 09/17/2022    4:27 AM  CMP  Glucose 70 - 99 mg/dL 107   BUN 6 - 20 mg/dL 11   Creatinine 0.44 - 1.00 mg/dL 0.67   Sodium 135 - 145 mmol/L 136   Potassium 3.5 - 5.1 mmol/L 3.6   Chloride 98 - 111 mmol/L 98   CO2 22 - 32 mmol/L  27   Calcium 8.9 - 10.3 mg/dL 9.0   Total Protein 6.5 - 8.1 g/dL 6.3   Total Bilirubin 0.3 - 1.2 mg/dL 1.8   Alkaline Phos 38 - 126 U/L 76   AST 15 - 41 U/L 135   ALT 0 - 44 U/L 182    Edinburgh Score:    09/16/2022    7:34 PM  Edinburgh Postnatal Depression Scale Screening Tool  I have been able to laugh and see the funny side of things. 0  I have looked forward with enjoyment to things. 0  I have blamed myself unnecessarily when things went wrong. 1  I have been anxious or worried for no good reason. 2  I have felt scared or panicky for no good reason. 2  Things have been getting on top of  me. 0  I have been so unhappy that I have had difficulty sleeping. 0  I have felt sad or miserable. 1  I have been so unhappy that I have been crying. 0  The thought of harming myself has occurred to me. 0  Edinburgh Postnatal Depression Scale Total 6     After visit meds:  Allergies as of 09/17/2022       Reactions   Other Other (See Comments)   Penicillins Rash        Medication List     STOP taking these medications    omeprazole 20 MG tablet Commonly known as: PriLOSEC OTC       TAKE these medications    aspirin 81 MG chewable tablet Commonly known as: Aspirin Childrens Chew 1 tablet (81 mg total) by mouth daily.   [facility] Prenatal Gummy 0.18-25 MG Chew Chew 1 tablet by mouth daily.   ferrous sulfate 325 (65 FE) MG EC tablet Take 1 tablet (325 mg total) by mouth every other day.   furosemide 20 MG tablet Commonly known as: LASIX Take 1 tablet (20 mg total) by mouth daily.   ibuprofen 600 MG tablet Commonly known as: ADVIL Take 1 tablet (600 mg total) by mouth every 6 (six) hours as needed for moderate pain, cramping or fever.   metoprolol succinate 25 MG 24 hr tablet Commonly known as: Toprol XL Take 1 tablet (25 mg total) by mouth daily. What changed: how much to take   NIFEdipine 60 MG 24 hr tablet Commonly known as: ADALAT CC Take 1 tablet (60 mg total) by mouth 2 (two) times daily.   oxyCODONE 5 MG immediate release tablet Commonly known as: Oxy IR/ROXICODONE Take 1 tablet (5 mg total) by mouth every 4 (four) hours as needed for moderate pain.         Discharge home in stable condition Infant Feeding: Bottle and Breast Infant Disposition:home with mother Discharge instruction: per After Visit Summary and Postpartum booklet. Activity: Advance as tolerated. Pelvic rest for 6 weeks.  Diet: routine diet and iron rich diet Future Appointments: Future Appointments  Date Time Provider [place]  10/02/2022 10:00 AM Freada Bergeron,  MD [facility]   Follow up Visit: Message sent to [facility] [place] on 3/27  Please schedule this patient for a In person postpartum visit in 6 weeks with the following provider: MD. Additional Postpartum F/U:Incision check 1 week and BP check 1 week  High risk pregnancy complicated by: HTN and HFrEf (EF 35-40%) Delivery mode:  C-Section, Low Transverse  Anticipated Birth Control:  Unsure   09/17/2022 Mora Bellman, MD

## 2022-09-12 NOTE — Transfer of Care (Signed)
Immediate Anesthesia Transfer of Care Note  Patient: [name]  Procedure(s) Performed: CESAREAN SECTION (Abdomen)  Patient Location: ICU 2 M room 14  Anesthesia Type:Spinal  Level of Consciousness: awake, alert , oriented, and patient cooperative  Airway & Oxygen Therapy: Patient Spontanous Breathing  Post-op Assessment: Report given to RN and Post -op Vital signs reviewed and stable  Post vital signs: Reviewed and stable  Last Vitals:  Vitals Value Taken Time  BP    Temp    Pulse    Resp    SpO2      Last Pain:  Vitals:   09/11/22 2121  PainSc: 7          Complications: No notable events documented. Patient transferred with full monitor, VSS Spont resp,Report given to RN.

## 2022-09-12 NOTE — Telephone Encounter (Signed)
Left patient a message to call and schedule appointments.

## 2022-09-12 NOTE — Telephone Encounter (Signed)
-----   Message from Shelda Pal, DO sent at 09/12/2022 12:54 AM EDT ----- Regarding: Postpartum Hello,   Please schedule this patient for a In person postpartum visit in 6 weeks with the following provider: MD. Additional Postpartum F/U:Incision check 1 week and BP check 1 week  High risk pregnancy complicated by: HTN and HFrEf (EF 35-40%) Delivery mode: C-Section, Low Transverse  Anticipated Birth Control: Unsure  Thank you,  Shelda Pal, DO [facility] Fellow

## 2022-09-12 NOTE — Progress Notes (Addendum)
Patient ID: [name], female   DOB: 1985/11/06, 37 y.o.   MRN: [id number] I was called at 0550 regarding the patient I arrived on 35m at 0558  Her hemoglobin dropped from 11.1 pre operatively to 8.3 and hematocrit 24.1, platelets 297 Also post op her BP has been running low and the pulse has increased to 100's Her urine is light yellow and has been about 50 cc in the last 2 hours   Intraoperatively anesthesia did restrict her fluids She also had severe adhesions of her lower uterine third to the anterior abdominal walinvolving her bladder Her lower segment was welded by adhesions to the back of the bladder and to the abdominal wall Intra operatively we stayed abouve the adhesions and did not develop the bladder flap but did have to take the peritoneal adhesions down which created more bleeding The uterus was reasonable firm in the OR  All pedicles uterus peritoneal surfaces were hemostatic upon closing the abdomen Because of all of the raw dissected periotneal surface I place arista prior to closure  Abdominal exam revelas some gaseous distention above the uterus, soft normal exam 7 hours post op Incision clean dry intact I did a bimanual exam and there was some blood in the lower segment in uterus which was expressed less than 100 cc and her uterus was firm, lower segment was intact( I added this statement to the note at 09/12/2022 5:11 PM because I realized that I did not put it in my original note from this am, as I was interrupted during the note and attended to a fetal bradycardia on L&D)  I performed a bedside sonogram with the unit on 65M and there is some fluid in the gutters, not a lot but some as I would expect post op C section, we did not irrigate or clear out the gutters at the end of surgery, there was surface oozing but we spent time doing interrupted firgure of 8s and all surfaces were hemostatic.  I placed arista but it was dry we watched it a couple of minutes and there was  no blood being coagulated  Clinically I feel she would benefit from a unit of blood given her soft pressures and heart failure I talked with Dr Ethelene Hal who concurs  I have also ordered DIC panel for baseline eval  Stopped her magnesium infusion so as not to have another variable impacting her BP Will use IV keppra 500 mg IV q 12 x 4 doses for seizure prophylaxis Dr Halford Chessman does not think lasix is indicated peri transfusion and I will hold her ordered doses(which was because of her severe pre E dx)  Florian Buff, MD 09/12/2022 6:54 AM ( I started this note about 6:10 but was called away to L&D for fetal bradycardia)

## 2022-09-12 NOTE — Progress Notes (Signed)
eLink Physician-Brief Progress Note Patient Name: Adreanne Levar DOB: 23-Feb-1986 MRN: [id number]   Date of Service  09/12/2022  HPI/Events of Note  BP 87/58, MAP 66, stat CBC result is pending, OB indicates that patient was kept relatively  dry during the case.  eICU Interventions  Albumin 5 % 500 ml iv bolus while awaiting result of stat CBC.        Kerry Kass Amir Glaus 09/12/2022, 5:17 AM

## 2022-09-12 NOTE — Op Note (Signed)
[name] PROCEDURE DATE: 09/12/2022  PREOPERATIVE DIAGNOSES: Intrauterine pregnancy at [redacted]w[redacted]d weeks gestation; patient declines vag del attempt and severe preeclamsia, active labor, h/o c/s x2  POSTOPERATIVE DIAGNOSES: The same, viable infant delivered  PROCEDURE: Repeat Low Transverse Cesarean Section  SURGEON:   Dr. Tania Ade  ASSISTANT:  Shelda Pal, DO An experienced assistant was required given the standard of surgical care given the complexity of the case.  This assistant was needed for exposure, dissection, suctioning, retraction, instrument exchange, assisting with delivery with administration of fundal pressure, and for overall help during the procedure.  ANESTHESIOLOGY TEAM: Anesthesiologist: Pervis Hocking, DO CRNA: Valetta Fuller, CRNA; Alvy Bimler, CRNA  INDICATIONS: [name] is a 37 y.o. [id number] at [redacted]w[redacted]d here for cesarean section secondary to the indications listed under preoperative diagnoses; please see preoperative note for further details.  The risks of surgery were discussed with the patient including but were not limited to: bleeding which may require transfusion or reoperation; infection which may require antibiotics; injury to bowel, bladder, ureters or other surrounding organs; injury to the fetus; need for additional procedures including hysterectomy in the event of a life-threatening hemorrhage; formation of adhesions; placental abnormalities wth subsequent pregnancies; incisional problems; thromboembolic phenomenon and other postoperative/anesthesia complications.  The patient concurred with the proposed plan, giving informed written consent for the procedure.    FINDINGS:  Viable female infant in cephalic presentation.  Apgars 8 and 9.  Amniotic fluid: clear.  Intact placenta, three vessel cord.  Normal uterus, fallopian tubes and ovaries bilaterally.  ANESTHESIA:  spinal/epidural INTRAVENOUS FLUIDS: 400 ml   ESTIMATED BLOOD  LOSS: 837 ml URINE OUTPUT:  300 ml SPECIMENS: Placenta sent to L&D . COMPLICATIONS: None immediate  PROCEDURE IN DETAIL:  The patient preoperatively received intravenous antibiotics and had sequential compression devices applied to her lower extremities.  She was then taken to the operating room where spinal/epidural anesthesia was found to be adequate. She was then placed in a dorsal supine position with a leftward tilt, and prepped and draped in a sterile manner.  A foley catheter was  placed into her bladder and attached to constant gravity.  After an adequate timeout was performed, a Pfannenstiel skin incision was made with scalpel and carried through to the underlying layer of fascia. The fascia was incised in the midline, and this incision was extended bluntly. The rectus muscles were separated in the midline and the peritoneum was entered bluntly.   A large adhesion was noted attached to the anterior uterine wall and extending down towards the bladder. This adhesion was separated from the uterine wall with bovie.   Attention was turned to the lower uterine segment where a low transverse hysterotomy was made with a scalpel and extended bluntly in caudad and cephalad directions.  The infant was successfully delivered, the cord was clamped and cut after one minute, and the infant was handed over to the awaiting neonatology team. Uterine massage was then administered, and the placenta delivered intact with a three-vessel cord. The uterus was then exteriorized and cleared of clots and debris.  The hysterotomy was closed with 0-Monocryl in a running fashion. Multiple figure-of-eight 0 monocryl serosal stitches were placed to help with hemostasis.    The pelvis was cleared of all clot and debris. Hemostasis was confirmed on all surfaces. The uterus was returned to the abdomen and the incision was once again inspected and found to be hemostatic. The fascia was then closed using 0 Vicryl in a running  fashion.  The subcutaneous layer was inspected, any areas of bleeding were cauterized with the bovie,  was found to be hemostatic.. The skin was closed with a 4-0 Vicryl subcuticular stitch. The patient tolerated the procedure well. Sponge, instrument and needle counts were correct x 3.  She was taken to the recovery room in stable condition.   Shelda Pal, [place] Fellow, Faculty practice [place] for [facility] 09/12/22  1:28 AM

## 2022-09-12 NOTE — Progress Notes (Signed)
An USGPIV (ultrasound guided PIV) has been placed for short-term vasopressor infusion. A correctly placed ivWatch must be used when administering Vasopressors. Should this treatment be needed beyond 72 hours, central line access should be obtained.  It will be the responsibility of the bedside nurse to follow best practice to prevent extravasations.   ?

## 2022-09-12 NOTE — Progress Notes (Addendum)
Patient had another syncopal episode with sitting up this afternoon. When evaluate at bedside, she is lethargic but awake to verbal stimulation. She is oriented x 4. Strength 5/5 bilateral UE and LE. Her blood pressure is low at 94/62 with heart rate in the 110s.  She is afebrile.  Repeat hemoglobin after 2 unit of blood remains low at 8.1.  Lactic acid was 5.5.  She only had minimal vaginal bleeding since this morning.  Differential for shock include hemorrhagic shock, septic shock or cardiogenic shock.  Bedside echo showed stable EF but will obtain a formal echocardiogram to confirm.  Started vancomycin and cefepime and obtain blood culture.  OB/GYN evaluated patient at bedside and thought she could have retained uterine bleeding.  Stat CT abdomen/pelvis to look for any other bleeding source.  Will transfuse one more unit of blood, 250 cc bolus of LR and continue Oxytocin. Closed monitor for sign of pulmonary edema.   Pending CMP, troponin  Addendum Echocardiogram show EF of 30-35%, down from 35-40%'s.  Cardiology was consulted.  CT abdomen pelvis showed hemorrhage within the peritoneal space as well as retroperitoneal.  I spoke with Dr. Roselie Awkward with OB/GYN who will decide on the need for surgery.  IVC appears collapsed so we will increase IV fluid rate to 125 cc/h.  Can stop IV fluid if developed hypoxia.

## 2022-09-12 NOTE — Consult Note (Addendum)
Rounding Note    Patient Name: [name] Date of Encounter: 09/12/2022  [place] Cardiologist: Freada Bergeron, MD   Subjective   37 yof with a hx of HFrEF, MR, preeclampsia who was admitted for scheduled ceserean delivery. Underwent delivery and subsequently transferred to ICU for closer monitoring.  Experienced vasovagal episode today, but quickly recovered. Has been somnlent. Patient has been hypotensive, tachycardic, hypothermic. Receiving small doses of IVF. Post-op hgb noted to drop from 11 to 8. There is concern for abdominal bleeding. CT scan was obtained that shows intra and extre-peritoneal hemorrhage.   Currently on norepi IV and receiving IVFs  Lactic acid 5.5 Trops 19  Patient follows with cardiology as outpatient. 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 EF at that time 45%, moderate to severe mitral leak. She was on a long term monitor on 2/19-2/22 which showed NSR predominantly. Was taking metop. Repeat ECHO 3/15 showed progressive decline in EF 35-40%, but was otherwise stable CV standpoint. Planned for early delivery. Last ECHO 3/22 stable 35-40% with G1DD. Normal RV function and PA pressure. MR again noted. No aortic regurg.  3/27 ECHO EF 30-35% with only mild MR now.   Inpatient Medications    Scheduled Meds:  sodium chloride   Intravenous Once   acetaminophen  1,000 mg Oral Q6H   Chlorhexidine Gluconate Cloth  6 each Topical Q0600   gabapentin  200 mg Oral QHS   ketorolac  30 mg Intravenous Q6H   Followed by   Derrill Memo ON 09/13/2022] ibuprofen  600 mg Oral Q6H   prenatal multivitamin  1 tablet Oral Q1200   [START ON 09/13/2022] senna-docusate  2 tablet Oral Daily   simethicone  80 mg Oral TID PC   Continuous Infusions:  sodium chloride 250 mL (09/12/22 1751)   calcium gluconate     ceFEPime (MAXIPIME) IV 2 g (09/12/22 1749)   lactated ringers Stopped (09/12/22 1453)   lactated ringers      levETIRAcetam Stopped (09/12/22 1045)   norepinephrine (LEVOPHED) Adult infusion     oxytocin 2.5 Units/hr (09/12/22 1700)   vancomycin 1,500 mg (09/12/22 1757)   [START ON 09/13/2022] vancomycin     PRN Meds: coconut oil, witch hazel-glycerin **AND** dibucaine, diphenhydrAMINE, labetalol **AND** labetalol **AND** labetalol **AND** hydrALAZINE **AND** Measure blood pressure, HYDROmorphone (DILAUDID) injection, magnesium hydroxide, menthol-cetylpyridinium, mouth rinse, oxyCODONE, perflutren lipid microspheres (DEFINITY) IV suspension, simethicone, zolpidem   Vital Signs    Vitals:   09/12/22 1600 09/12/22 1653 09/12/22 1700 09/12/22 1708  BP: 100/76 114/77 128/86   Pulse: (!) 110 90 93 (!) 102  Resp: 15 20 14 15   Temp:  (!) 96 F (35.6 C)  (!) 96.8 F (36 C)  TempSrc:  Oral  Oral  SpO2: 99% 100% 100% 100%  Weight:      Height:        Intake/Output Summary (Last 24 hours) at 09/12/2022 1838 Last data filed at 09/12/2022 1700 Gross per 24 hour  Intake 3577.5 ml  Output 1662 ml  Net 1915.5 ml      09/12/2022    1:15 AM 09/05/2022    2:28 PM 09/05/2022   12:43 PM  Last 3 Weights  Weight (lbs) 145 lb 15.1 oz 151 lb 149 lb  Weight (kg) 66.2 kg 68.493 kg 67.586 kg      Telemetry    Sinus rhythm, no longer tachycardic - Personally Reviewed  ECG    Sinus rhythm, tachycardic, with  non-specific t wave changes - Personally Reviewed  Physical Exam   GEN: Lethargic Neck: No JVD Cardiac: RRR, no murmurs, rubs, or gallops.  Respiratory: Clear to auscultation bilaterally. GI: Soft, nontender, non-distended  MS: No edema; No deformity. Neuro:  Nonfocal  Psych: Normal affect   Labs    High Sensitivity Troponin:   Recent Labs  Lab 09/12/22 1435 09/12/22 1636  TROPONINIHS 19* 16     Chemistry Recent Labs  Lab 09/11/22 2148 09/12/22 1435  NA 138 132*  K 4.2 5.1  CL 104 101  CO2 22 19*  GLUCOSE 89 117*  BUN 13 18  CREATININE 0.71 1.04*  CALCIUM 9.0 7.0*  PROT  6.7 5.4*  ALBUMIN 3.0* 2.7*  AST 44* 61*  ALT 39 31  ALKPHOS 97 58  BILITOT 0.4 1.5*  GFRNONAA >60 >60  ANIONGAP 12 12    Lipids No results for input(s): "CHOL", "TRIG", "HDL", "LABVLDL", "LDLCALC", "CHOLHDL" in the last 168 hours.  Hematology Recent Labs  Lab 09/11/22 2148 09/12/22 0500 09/12/22 0637 09/12/22 1435  WBC 10.1 16.1*  --   --   RBC 4.43 3.21*  --   --   HGB 11.1* 8.3*  --  8.1*  HCT 33.5* 24.1*  --  23.8*  MCV 75.6* 75.1*  --   --   MCH 25.1* 25.9*  --   --   MCHC 33.1 34.4  --   --   RDW 14.1 13.9  --   --   PLT 336 297 278  --    Thyroid No results for input(s): "TSH", "FREET4" in the last 168 hours.  BNPNo results for input(s): "BNP", "PROBNP" in the last 168 hours.  DDimer  Recent Labs  Lab 09/12/22 [phone]  DDIMER 5.77*     Radiology    CT ABDOMEN PELVIS WO CONTRAST  Result Date: 09/12/2022 CLINICAL DATA:  Sepsis. Status post low transverse cesarean section 09/11/2022 EXAM: CT ABDOMEN AND PELVIS WITHOUT CONTRAST TECHNIQUE: Multidetector CT imaging of the abdomen and pelvis was performed following the standard protocol without IV contrast. RADIATION DOSE REDUCTION: This exam was performed according to the departmental dose-optimization program which includes automated exposure control, adjustment of the mA and/or kV according to patient size and/or use of iterative reconstruction technique. COMPARISON:  None Available. FINDINGS: Lower chest: There is some linear opacity at the bases likely scar or atelectasis. No pleural effusion. Patulous lower esophagus. Hepatobiliary: The top of the liver is clipped off the edge of the imaging field. On this non IV contrast exam the liver is grossly preserved. The gallbladder is mildly distended. Pancreas: Unremarkable. No pancreatic ductal dilatation or surrounding inflammatory changes. Spleen: Normal in size without focal abnormality. Adrenals/Urinary Tract: The adrenal glands are preserved. No abnormal calcifications are  seen within either kidney nor along the expected course of either ureter. Bladder is contracted with a Foley catheter. Stomach/Bowel: Small amount of high density debris in the lumen of the stomach. The stomach is nondilated. Scattered colonic stool. The large bowel is nondilated. There is a normal caliber appendix in the right lower quadrant. Small bowel is nondilated. No oral contrast. Vascular/Lymphatic: Grossly the aorta and IVC have normal course and caliber. The IVC is more collapsed than usually seen. Please correlate with patient's hematocrit. No discrete abnormal lymph node enlargement on this noncontrast examination in the abdomen and pelvis. Reproductive: Postpartum enlarged uterus measuring proximally 17.2 by 8.8 by 11.4 cm. There is some air noted along the lower anterior uterus consistent with  the surgical change. Adjacent gas as well in the extraperitoneal space. Other: Complex areas of fluid are seen throughout the abdomen and pelvis, moderate. Areas extend upper along the liver and spleen as well as along the pericolic gutters and near the greater curve of the stomach. There is also areas of high density specifically in the extraperitoneal space. Components of hematoma. Musculoskeletal: Slight curvature of the spine. There is some air in the epidural space of the mid lumbar spine. This could be venous. Please correlate for any history of intervention. Anterior abdominal wall and pelvic wall soft tissue thickening, gas and skin thickening consistent with a postsurgical state. IMPRESSION: Enlarged postpartum uterus with changes noted of recent cesarean section. Moderate scattered free fluid with areas of high density consistent with areas of hemorrhage. This includes within the peritoneal space extending up along the liver, spleen and pericolic gutters as well as in the extraperitoneal space anteriorly and retroperitoneal. IVC appears collapsed. Few scattered areas of air also identified but these  easily could be postsurgical. In principle infection is difficult to exclude on the basis of CT scan alone. No bowel obstruction.  Normal appendix. Critical Value/emergent results were called by telephone at the time of interpretation on 09/12/2022 at 3:20 pm to provider Dr. Alfonse Spruce, who verbally acknowledged these results. Electronically Signed   By: Jill Side M.D.   On: 09/12/2022 18:22   ECHOCARDIOGRAM LIMITED  Result Date: 09/12/2022    ECHOCARDIOGRAM LIMITED REPORT   Patient Name:   [name] Date of Exam: 09/12/2022 Medical Rec #:  [id number]        Height:       64.0 in Accession #:    [id number]       Weight:       145.9 lb Date of Birth:  [birth year]        BSA:          3.711 m Patient Age:    69 years         BP:           100/76 mmHg Patient Gender: F                HR:           104 bpm. Exam Location:  Inpatient Procedure: Limited Echo, Cardiac Doppler, Limited Color Doppler and Intracardiac            Opacification Agent Indications:    Abnormal ECG R94.31                 Cardiomyopathy-Unspecified I42.9  History:        Patient has prior history of Echocardiogram examinations, most                 recent 09/07/2022. CHF; Maternal mitral valve regurgitation                 affecting pregnancy in third trimester, antepartum.  Sonographer:    Greer Pickerel Referring Phys: [id number] Florian Buff  Sonographer Comments: Image acquisition challenging due to patient body habitus and Image acquisition challenging due to respiratory motion. IMPRESSIONS  1. Left ventricular ejection fraction, by estimation, is 30 to 35%. The left ventricle has moderately decreased function. The left ventricle demonstrates global hypokinesis. There is moderate asymmetric left ventricular hypertrophy of the inferior segment.  2. Right ventricular systolic function is normal. The right ventricular size is normal.  3. The mitral valve is normal in structure. Mild mitral valve regurgitation. No evidence  of mitral stenosis.  4.  The aortic valve is normal in structure. Aortic valve regurgitation is not visualized. No aortic stenosis is present.  5. The inferior vena cava is normal in size with greater than 50% respiratory variability, suggesting right atrial pressure of 3 mmHg. FINDINGS  Left Ventricle: Left ventricular ejection fraction, by estimation, is 30 to 35%. The left ventricle has moderately decreased function. The left ventricle demonstrates global hypokinesis. Definity contrast agent was given IV to delineate the left ventricular endocardial borders. The left ventricular internal cavity size was normal in size. There is moderate asymmetric left ventricular hypertrophy of the inferior segment. Right Ventricle: The right ventricular size is normal. No increase in right ventricular wall thickness. Right ventricular systolic function is normal. Left Atrium: Left atrial size was normal in size. Right Atrium: Right atrial size was normal in size. Pericardium: There is no evidence of pericardial effusion. Mitral Valve: The mitral valve is normal in structure. Mild mitral valve regurgitation. No evidence of mitral valve stenosis. Tricuspid Valve: The tricuspid valve is normal in structure. Tricuspid valve regurgitation is not demonstrated. No evidence of tricuspid stenosis. Aortic Valve: The aortic valve is normal in structure. Aortic valve regurgitation is not visualized. No aortic stenosis is present. Pulmonic Valve: The pulmonic valve was normal in structure. Pulmonic valve regurgitation is not visualized. No evidence of pulmonic stenosis. Aorta: The aortic root is normal in size and structure. Venous: The inferior vena cava is normal in size with greater than 50% respiratory variability, suggesting right atrial pressure of 3 mmHg. IAS/Shunts: No atrial level shunt detected by color flow Doppler. Additional Comments: Spectral Doppler performed. Color Doppler performed.  LEFT VENTRICLE PLAX 2D LVIDd:         4.40 cm LVIDs:         4.30  cm LV PW:         1.70 cm LV IVS:        0.90 cm  LV Volumes (MOD) LV vol d, MOD A2C: 34.9 ml LV vol d, MOD A4C: 65.1 ml LV vol s, MOD A2C: 28.9 ml LV vol s, MOD A4C: 48.9 ml LV SV MOD A2C:     6.0 ml LV SV MOD A4C:     65.1 ml LV SV MOD BP:      8.1 ml MITRAL VALVE MV Area (PHT): 3.89 cm MV Decel Time: 195 msec MR Peak grad: 77.4 mmHg MR Vmax:      440.00 cm/s MV E velocity: 86.50 cm/s MV A velocity: 56.10 cm/s MV E/A ratio:  1.54 Kardie Tobb DO Electronically signed by Berniece Salines DO Signature Date/Time: 09/12/2022/5:25:06 PM    Final     Cardiac Studies   IMPRESSIONS     1. Left ventricular ejection fraction, by estimation, is 30 to 35%. The  left ventricle has moderately decreased function. The left ventricle  demonstrates global hypokinesis. There is moderate asymmetric left  ventricular hypertrophy of the inferior  segment.   2. Right ventricular systolic function is normal. The right ventricular  size is normal.   3. The mitral valve is normal in structure. Mild mitral valve  regurgitation. No evidence of mitral stenosis.   4. The aortic valve is normal in structure. Aortic valve regurgitation is  not visualized. No aortic stenosis is present.   5. The inferior vena cava is normal in size with greater than 50%  respiratory variability, suggesting right atrial pressure of 3 mmHg.   FINDINGS   Left Ventricle: Left ventricular  ejection fraction, by estimation, is 30  to 35%. The left ventricle has moderately decreased function. The left  ventricle demonstrates global hypokinesis. Definity contrast agent was  given IV to delineate the left  ventricular endocardial borders. The left ventricular internal cavity size  was normal in size. There is moderate asymmetric left ventricular  hypertrophy of the inferior segment.   Right Ventricle: The right ventricular size is normal. No increase in  right ventricular wall thickness. Right ventricular systolic function is  normal.   Left  Atrium: Left atrial size was normal in size.   Right Atrium: Right atrial size was normal in size.   Pericardium: There is no evidence of pericardial effusion.   Mitral Valve: The mitral valve is normal in structure. Mild mitral valve  regurgitation. No evidence of mitral valve stenosis.   Tricuspid Valve: The tricuspid valve is normal in structure. Tricuspid  valve regurgitation is not demonstrated. No evidence of tricuspid  stenosis.   Aortic Valve: The aortic valve is normal in structure. Aortic valve  regurgitation is not visualized. No aortic stenosis is present.   Pulmonic Valve: The pulmonic valve was normal in structure. Pulmonic valve  regurgitation is not visualized. No evidence of pulmonic stenosis.   Aorta: The aortic root is normal in size and structure.   Venous: The inferior vena cava is normal in size with greater than 50%  respiratory variability, suggesting right atrial pressure of 3 mmHg.   IAS/Shunts: No atrial level shunt detected by color flow Doppler.   Additional Comments: Spectral Doppler performed. Color Doppler performed.    LEFT VENTRICLE  PLAX 2D  LVIDd:         4.40 cm  LVIDs:         4.30 cm  LV PW:         1.70 cm  LV IVS:        0.90 cm    LV Volumes (MOD)  LV vol d, MOD A2C: 34.9 ml  LV vol d, MOD A4C: 65.1 ml  LV vol s, MOD A2C: 28.9 ml  LV vol s, MOD A4C: 48.9 ml  LV SV MOD A2C:     6.0 ml  LV SV MOD A4C:     65.1 ml  LV SV MOD BP:      8.1 ml   MITRAL VALVE  MV Area (PHT): 3.89 cm  MV Decel Time: 195 msec  MR Peak grad: 77.4 mmHg  MR Vmax:      440.00 cm/s  MV E velocity: 86.50 cm/s  MV A velocity: 56.10 cm/s  MV E/A ratio:  1.54   Patient Profile     37 y.o. 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 Patient became hypotensive afterward requiring IV fluids, blood products, and Norepi to maintain pressure. ECHO was repeated and demonstrated interval decrease  in EF to 30-35%. Cardiology was consulted due to concern for cardiogenic shock/post-partum cardiomyopathy.   Assessment & Plan    # Shock - multifactorial vs hemorrhagic Patient with c-section delivery earlier today. Notably hypotensive requiring Norepi, fluids, and blood products to maintain MAP. Differential included septic, vs hemorrhagic, vs cardiogenic.  She has been started on abx. Further workup with CT showed intra-abdominal hemorrhage. ECHO was obtained and showed interval decrease in EF, but this is likely not driving etiology of patient's current presentation. Hemorrhagic shock is most likely predominant etiology at this time. Lactic does seem to be improving with  fluids. She is at risk of ATN and this will need to be monitored. 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 She has been followed in the outpatient setting for this and has been stable from a CV standpoint. Repeat ECHO obtained today does show decline in EF to 30-35%, however, this does not fully explain her presentation. Metop was held due to hypotension. BP has demonstrated some improvement with fluid resuscitation and pressor support. Would not obtain additional ECHO at this time unless her presentation changes. Cardiac function is expected to improve and repeat ECHO can be obtained in the outpatient setting.  - In the meantime, would f/u second troponin.  - Continue to monitor on telemetry.  - Fluids as needed for BP support.   - Add back metop and GDMT as BP permits.   # Mitral Regurgitation Appears improved from moderate/severe to mild on most recent ECHO. Further workup of this is planned as an outpatient.   For questions or updates, please contact [place] Please consult [email or web address] for contact info under   Signed, Delene Ruffini, MD  09/12/2022, 6:38 PM

## 2022-09-12 NOTE — Progress Notes (Signed)
RN in patients during shift change. Patient BP 70/40's when sleeping, rises when RN wakes patient. Patient extremely drowsy and unable to stay awake for longer than 30 seconds without stimulation. MD contacted, 250 bolus given and blood ordered.  Patient received 2U PRBCS  [id number]- patient transferred from bed to chair per patient request. Patient sat on edge of bed then transferred to the chair with minimal assistance. Patient then stated that she "didn't feel well" and briefly lost consciousness. MD at bedside, patient had pulses throughout. Patient able to wake up for RN and answer orientation questions appropriately. Patient still drowsy.   Pt attempted to pump at 1000, 1330  1530- patient requested to get up in chair. Patient assisted to sit on the side of the bed, patient blood pressure remains unchanged but patient eyes rolled back and began snoring. Patient placed in the bed in a side lying position until awake and able to answer questions. MD notified, patient answers orientation questions appropriately at this time.   Patient given additional 1 unit PRBCs, taken to CT and returned to room safely. Pitocin and LR restarted per OB and CCM MD and 250 bolus given.   1800- Patient more alert, talking with family.

## 2022-09-12 NOTE — Progress Notes (Addendum)
Pharmacy Antibiotic Note  [name] is a 37 y.o. female admitted on 09/11/2022 with sepsis.  Pharmacy has been consulted for vancomycin and cefepime dosing.  S/p recent C section, admitted with eclampsia. WBC 10>16. Afebrile. LA 5.5. Has PCN allergy listed with rash but has tolerated cephalosporins in the past.  Plan: Vancomycin 1500 mg IV once then 750 every 12 hours (estAUC 424, VD 0.72) Cefepime 2g IV every 8 hours Monitor renal fx, cx results, clinical pic, and vanc levels as appropriate  Height: 5\' 4"  (162.6 cm) Weight: 66.2 kg (145 lb 15.1 oz) IBW/kg (Calculated) : 54.7  Temp (24hrs), Avg:97.6 F (36.4 C), Min:97 F (36.1 C), Max:98.4 F (36.9 C)  Recent Labs  Lab 09/11/22 2148 09/12/22 0500 09/12/22 1435  WBC 10.1 16.1*  --   CREATININE 0.71  --   --   LATICACIDVEN  --   --  5.5*    Estimated Creatinine Clearance: 91 mL/min (by C-G formula based on SCr of 0.71 mg/dL).    Allergies  Allergen Reactions   Other Other (See Comments)   Penicillins Rash    Antimicrobials this admission: Vancomycin 3/27 >>  Cefepime 3/27 >>   Dose adjustments this admission: N/A  Microbiology results: 3/27 BCx: sent 3/27 MRSA PCR: neg  Thank you for allowing pharmacy to be a part of this patient's care.  Antonietta Jewel, PharmD, [place] Clinical Pharmacist  Phone: [phone] 09/12/2022 4:27 PM  Please check AMION for all [place] phone numbers After 10:00 PM, call [place] [phone]

## 2022-09-12 NOTE — Progress Notes (Signed)
Patient ID: [name], female   DOB: [birth year], 37 y.o.   MRN: [id number] 1 Day Post-Op Procedure(s) (LRB): CESAREAN SECTION (N/A)  [name] is a 37 y.o. female patient.  POD#1 repeat C section(previous x 2) with severe pelvic abdominal wall adhesions to the lower uterine segment Has history of bladder injury with her last C section  Post op hypotension and tachycardia with drop in hemoglobin, felt likely to be due to post op peritoneal oozing from taking down the adhesions to be able to deliver the baby Was hemostatic leaving OR but suspected this earlier today(0600) and confirmed by clinical picture throughout the day and by CT scan   Last several hours her BP has stabilized and her hemoglobin has as well after 3 units PRBC  In addition patient is feeling better less light headed  No diagnosis found.  Past Medical History:  Diagnosis Date   Bacterial infection    History of chicken pox    Systolic heart failure ([place])     No past surgical history pertinent negatives on file.  Scheduled Meds:  acetaminophen  1,000 mg Oral Q6H   Chlorhexidine Gluconate Cloth  6 each Topical Q0600   gabapentin  200 mg Oral QHS   ketorolac  30 mg Intravenous Q6H   Followed by   Derrill Memo ON 09/13/2022] ibuprofen  600 mg Oral Q6H   prenatal multivitamin  1 tablet Oral Q1200   [START ON 09/13/2022] senna-docusate  2 tablet Oral Daily   simethicone  80 mg Oral TID PC    Continuous Infusions:  sodium chloride 10 mL/hr at 09/12/22 2100   ceFEPime (MAXIPIME) IV Stopped (09/12/22 1819)   lactated ringers Stopped (09/12/22 1453)   lactated ringers 125 mL/hr at 09/12/22 2100   levETIRAcetam 500 mg (09/12/22 2212)   norepinephrine (LEVOPHED) Adult infusion     oxytocin 2.5 Units/hr (09/12/22 2100)   [START ON 09/13/2022] vancomycin      PRN Meds:chewing gum (ORBIT) sugar free, coconut oil, witch hazel-glycerin **AND** dibucaine, diphenhydrAMINE, labetalol **AND** labetalol **AND**  labetalol **AND** hydrALAZINE **AND** Measure blood pressure, HYDROmorphone (DILAUDID) injection, magnesium hydroxide, menthol-cetylpyridinium, mouth rinse, oxyCODONE, simethicone, zolpidem  Allergies  Allergen Reactions   Other Other (See Comments)   Penicillins Rash    Principal Problem:   Status post repeat low transverse cesarean section Active Problems:   Supervision of high risk pregnancy, antepartum   History of gestational hypertension   Multigravida of advanced maternal age in third trimester   Systolic heart failure ([place])   Maternal mitral valve regurgitation affecting pregnancy in third trimester, antepartum   Previous cesarean delivery affecting pregnancy   Severe preeclampsia      Objective   Vitals:   09/12/22 2200 09/12/22 2215 09/12/22 2230 09/12/22 2245  BP: 127/88 (!) 125/90 126/82 125/75  Pulse: 87 86 91 87  Resp: 19 14 (!) 22 (!) 25  Temp:      TempSrc:      SpO2: 100% 99% 98% 98%  Weight:      Height:       Vitals:   09/12/22 1945 09/12/22 2000 09/12/22 2015 09/12/22 2045  BP: 139/84 (!) 142/91 (!) 145/82 139/87   09/12/22 2100 09/12/22 2115 09/12/22 2130 09/12/22 2145  BP: (!) 140/108 132/88 134/73 (!) 132/91   09/12/22 2200 09/12/22 2215 09/12/22 2230 09/12/22 2245  BP: 127/88 (!) 125/90 126/82 125/75     Subjective Objective: Vital signs (most recent): Blood pressure 125/75, pulse 87, temperature 98.2 F (36.8 C), temperature  source Oral, resp. rate (!) 25, height 5\' 4"  (1.626 m), weight 66.2 kg, last menstrual period 01/02/2022, SpO2 98 %, unknown if currently breastfeeding.   Gen  post op patient appropriately interactive Abdomen  distended mostly air in bowel  Incision  minimal oozing from time of surgery  I once again performed bedside sonogram and compared to my previous scan this am The right pericolic gutter has probably twice as much fluid in it but the left has very little and there is not diffuse blood thoroughout the cavity.   Based on her 3 units that seems consistent       Latest Ref Rng & Units 09/12/2022    8:24 PM 09/12/2022    2:35 PM 09/12/2022    6:37 AM  CBC  WBC 4.0 - 10.5 K/uL 12.6     Hemoglobin 12.0 - 15.0 g/dL 8.2  8.1    Hematocrit 36.0 - 46.0 % 24.0  23.8    Platelets 150 - 400 K/uL 154   278        Latest Ref Rng & Units 09/12/2022    2:35 PM 09/11/2022    9:48 PM 09/04/2022   11:40 AM  CMP  Glucose 70 - 99 mg/dL 117  89  77   BUN 6 - 20 mg/dL 18  13  9    Creatinine 0.44 - 1.00 mg/dL 1.04  0.71  0.54   Sodium 135 - 145 mmol/L 132  138  135   Potassium 3.5 - 5.1 mmol/L 5.1  4.2  4.1   Chloride 98 - 111 mmol/L 101  104  106   CO2 22 - 32 mmol/L 19  22  20    Calcium 8.9 - 10.3 mg/dL 7.0  9.0  9.0   Total Protein 6.5 - 8.1 g/dL 5.4  6.7  6.5   Total Bilirubin 0.3 - 1.2 mg/dL 1.5  0.4  0.5   Alkaline Phos 38 - 126 U/L 58  97  93   AST 15 - 41 U/L 61  44  56   ALT 0 - 44 U/L 31  39  44      Assessment & Plan POD #1 Repeat c section with post op intraperitoneal bleeding, likely due to denuded peritoneum of the anterior uterine wall to facilitate delivery.  Seems to have stabilized based on stable hemoglobin and firmer pressures, vital, good UOP.  At present it would appear there is no indication for reoperation exploration, since she appears to have stabilized again which is consistent with oozing surface rather than significant bleeding source.  I will repeat the bedside scan in am to evaluate for stability of the volume in the pericolic gutters.  Re operation likely would be unfulfilling given the likely source of peritoneal oozing from her uterine to periotneal adhesions and often times in similar case reoperation doesn't give a great deal of confidence that any problem has been solved.  Will recommend chewing gum to prevent ileus and encourage walking when appropriate.  Discussed with patient at length during my sonogram and she understands and agrees with the plan I layed out above.  Will  recheck CBC in 12 hours(0500) unless otherwise indicated  Severe pre eclampsia:  elevated BP now becoming more of an issue, with her bleeding seeming to have stopped or slowed,  recommend prn dosing of apresoline for management if needed, discussed with nurses and CCM aware.  Continue keppra instead of magnesium for seizure prophylaxis.  I stopped the Mag due to  its vasodilation properties and did not not want that on board with her evolving clinical picture earlier today  CHF: EF 30-35%, which is a little worse and not surprising given her surgery and post op course.  Cardiology is aware  Florian Buff, MD 09/12/2022, 10:50 PM

## 2022-09-12 NOTE — Consult Note (Signed)
NAME:  [name], MRN:  [id number], DOB:  26-Dec-1985, LOS: 1 ADMISSION DATE:  09/11/2022, CONSULTATION DATE:  3/26 REFERRING MD:  Dr Elonda Husky (OB), CHIEF COMPLAINT:  pre-eclampsia   History of Present Illness:  37 year old female with PMH as below, which is significant for moderate mitral regurgitation, HFrEF (LVEF 35-40%), and pre-eclampsia. She is followed by cardiology in the outpatient setting and is treated with metoprolol and ASA daily. There was a scheduled cesarean planned for 4/2. She presented to [facility] hospital 3/26 as a [id number] with active IUP at [redacted]w[redacted]d in active labor. She was noted to be hypertensive with BP 161/100 and was started on magnesium and labetalol. She was taken for cesarean delivery, which was uncomplicated and she was transferred to ICU post-operatively for close monitoring. PCCM consulted to assist with ICU care.   Pertinent  Medical History   has a past medical history of Bacterial infection, History of chicken pox, and Systolic heart failure ([place]).   Significant Hospital Events: Including procedures, antibiotic start and stop dates in addition to other pertinent events   3/26 admit in active labor, taken for cesarean. To ICU post-op for close monitoring.   Interim History / Subjective:  Patient is seen at bedside with her husband.  She is drowsy but awake to verbal stimulation.  She denies any discomfort or pain.  Reports lightheadedness.  Objective   Blood pressure (!) 84/65, pulse (!) 103, temperature (!) 97.1 F (36.2 C), temperature source Oral, resp. rate 13, height 5\' 4"  (1.626 m), weight 66.2 kg, last menstrual period 01/02/2022, SpO2 99 %, unknown if currently breastfeeding.        Intake/Output Summary (Last 24 hours) at 09/12/2022 0807 Last data filed at 09/12/2022 0700 Gross per 24 hour  Intake 1720.58 ml  Output 1487 ml  Net 233.58 ml   Filed Weights   09/12/22 0115  Weight: 66.2 kg    Examination: Physical Exam Constitutional:       Comments: Drowsy but awake to verbal stimulation  HENT:     Head: Normocephalic.  Eyes:     General:        Right eye: No discharge.        Left eye: No discharge.     Conjunctiva/sclera: Conjunctivae normal.  Cardiovascular:     Rate and Rhythm: Tachycardia present.     Comments: Holosystolic murmur heard best at the left upper and lower sternal border Pulmonary:     Effort: Pulmonary effort is normal. No respiratory distress.  Abdominal:     Comments: Firm.  Nontender to palpation.  Musculoskeletal:     Cervical back: Normal range of motion.     Right lower leg: No edema.     Left lower leg: No edema.  Skin:    General: Skin is warm.  Psychiatric:        Mood and Affect: Mood normal.      Resolved Hospital Problem list     Assessment & Plan:  Pre-eclampsia with severe features: s/p cesarean delivery 3/27 early AM @ 36 wk.  Acute blood loss anemia Hgb 11 -> 8 Hemorrhagic shock Patient was hypotensive likely from acute blood loss anemia in the setting of vasodilation from IV magnesium.  Per OB, all surfaces were hemostatic postsurgery.  No signs of active bleeding.  Patient appears euvolemic on exam. - Keep SBP < 160 mmHg and DBP < 100 mmHg - PRN labetalol and hydralazine - Stop IV mag and start IV Keppra x 4  doses for seizure prophylaxis - She received 1 unit of blood.  Will transfuse one more unit - IV fluid cautiously in the setting of chronic HFrEF. - Levophed if needed. - Oxytocin per OB - Can resume Lovenox for DVT prophylaxis if hemoglobin stable this afternoon. - Pending DIC panel but I have low suspicion for this without thrombocytopenia.  No schistocytes seen on smear.  Chronic HFrEF (LVEF 35-40%) Mod - Severe MR She follows cardiology outpatient.  Plan was to pursue further workup with TEE or coronary CTA after pregnancy.  Patient is euvolemic so no diuresis indicated - Hold aspirin and metop today - If course remains uncomplicated can follow up with  cardiology outpatient, otherwise would not hesitate to consult cardiology.   Leukocytosis Likely stress reaction from her C-section  Post partum pain management - ketorolac, gabapentin  Best Practice (right click and "Reselect all SmartList Selections" daily)   Diet/type: reg DVT prophylaxis: LMWH GI prophylaxis: N/A Lines: Arterial Line Foley:  Yes, and it is still needed Code Status:  full code Last date of multidisciplinary goals of care discussion [updated husband at bedside.]  Labs   CBC: Recent Labs  Lab 09/11/22 2148 09/12/22 0500 09/12/22 0637  WBC 10.1 16.1*  --   HGB 11.1* 8.3*  --   HCT 33.5* 24.1*  --   MCV 75.6* 75.1*  --   PLT 336 297 278     Basic Metabolic Panel: Recent Labs  Lab 09/11/22 2148  NA 138  K 4.2  CL 104  CO2 22  GLUCOSE 89  BUN 13  CREATININE 0.71  CALCIUM 9.0    GFR: Estimated Creatinine Clearance: 91 mL/min (by C-G formula based on SCr of 0.71 mg/dL). Recent Labs  Lab 09/11/22 2148 09/12/22 0500  WBC 10.1 16.1*     Liver Function Tests: Recent Labs  Lab 09/11/22 2148  AST 44*  ALT 39  ALKPHOS 97  BILITOT 0.4  PROT 6.7  ALBUMIN 3.0*    No results for input(s): "LIPASE", "AMYLASE" in the last 168 hours. No results for input(s): "AMMONIA" in the last 168 hours.  ABG No results found for: "PHART", "PCO2ART", "PO2ART", "HCO3", "TCO2", "ACIDBASEDEF", "O2SAT"   Coagulation Profile: Recent Labs  Lab 09/12/22 0637  INR PENDING    Cardiac Enzymes: No results for input(s): "CKTOTAL", "CKMB", "CKMBINDEX", "TROPONINI" in the last 168 hours.  HbA1C: No results found for: "HGBA1C"  CBG: No results for input(s): "GLUCAP" in the last 168 hours.  Review of Systems:   Bolds are positive  Constitutional: weight loss, gain, night sweats, Fevers, chills, fatigue .  HEENT: headaches, Sore throat, sneezing, nasal congestion, post nasal drip, Difficulty swallowing, Tooth/dental problems, visual complaints visual  changes, ear ache CV:  chest pain, radiates:,Orthopnea, PND, swelling in lower extremities, dizziness, palpitations, syncope.  GI  heartburn, indigestion, abdominal pain, nausea, vomiting, diarrhea, change in bowel habits, loss of appetite, bloody stools.  Resp: cough, productive: , hemoptysis, dyspnea, chest pain, pleuritic.  Skin: rash or itching or icterus GU: dysuria, change in color of urine, urgency or frequency. flank pain, hematuria  MS: joint pain or swelling. decreased range of motion  Psych: change in mood or affect. depression or anxiety.  Neuro: difficulty with speech, weakness, numbness, ataxia    Past Medical History:  She,  has a past medical history of Bacterial infection, History of chicken pox, and Systolic heart failure ([place]).   Surgical History:   Past Surgical History:  Procedure Laterality Date   CESAREAN SECTION  CESAREAN SECTION N/A 02/02/2015   Procedure: CESAREAN SECTION;  Surgeon: Thurnell Lose, MD;  Location: [facility];  Service: Obstetrics;  Laterality: N/A;   DILATION AND CURETTAGE OF UTERUS     WISDOM TOOTH EXTRACTION       Social History:   reports that she has never smoked. She has never used smokeless tobacco. She reports that she does not drink alcohol and does not use drugs.   Family History:  Her family history includes Diabetes in her father; Hypertension in her father, mother, and paternal aunt. There is no history of Asthma, Cancer, Heart disease, or Stroke.   Allergies Allergies  Allergen Reactions   Other Other (See Comments)   Penicillins Rash     Home Medications  Prior to Admission medications   Medication Sig Start Date End Date Taking? Authorizing Provider  metoprolol succinate (TOPROL XL) 25 MG 24 hr tablet Take 0.5 tablets (12.5 mg total) by mouth daily. 08/01/22  Yes Freada Bergeron, MD  aspirin (ASPIRIN CHILDRENS) 81 MG chewable tablet Chew 1 tablet (81 mg total) by mouth daily. 03/24/22   Rasch, Anderson Malta I, NP  ferrous  sulfate 325 (65 FE) MG EC tablet Take 1 tablet (325 mg total) by mouth every other day. Patient not taking: Reported on 09/05/2022 08/30/22   Radene Gunning, MD  omeprazole (PRILOSEC OTC) 20 MG tablet Take 1 tablet (20 mg total) by mouth daily. Patient not taking: Reported on 09/05/2022 08/16/22   Radene Gunning, MD  Prenatal MV & Min w/FA-DHA ([employer] PRENATAL GUMMY) 0.18-25 MG CHEW Chew 1 tablet by mouth daily. 03/24/22   Rasch, Artist Pais, NP     Critical care time:     Gaylan Gerold, DO Internal Medicine Residency My pager: [phone]

## 2022-09-12 NOTE — Progress Notes (Signed)
eLink Physician-Brief Progress Note Patient Name: [name] DOB: 01-23-1986 MRN: [id number]   Date of Service  09/12/2022  HPI/Events of Note  Patient admitted to [facility] ICU post-op C-section due to a history of cardiomyopathy, HFrEF, mitral regurgitation, and pre-eclampsia.  eICU Interventions  New Patient Evaluation.        [name] 09/12/2022, 1:43 AM

## 2022-09-12 NOTE — Anesthesia Procedure Notes (Addendum)
Arterial Line Insertion Start/End3/26/2024 11:07 PM, 09/11/2022 11:15 PM Performed by: Pervis Hocking, DO, anesthesiologist  Patient location: Pre-op. Preanesthetic checklist: patient identified, IV checked, site marked, risks and benefits discussed, surgical consent, monitors and equipment checked, pre-op evaluation, timeout performed and anesthesia consent Lidocaine 1% used for infiltration Right, radial was placed Catheter size: 20 G Hand hygiene performed  and maximum sterile barriers used   Attempts: 1 Procedure performed without using ultrasound guided technique. Following insertion, dressing applied and Biopatch. Post procedure assessment: normal and unchanged  Patient tolerated the procedure well with no immediate complications.

## 2022-09-12 NOTE — Lactation Note (Signed)
This note was copied from a baby's chart. Lactation Consultation Note  Patient Name: [name] [id number] Date: 09/12/2022 Age:36 hours Reason for consult: Initial assessment;Late-preterm 34-36.6wks;Infant < 6lbs;Other (Comment) (mom in ICU) Mom wanting to try to BF w/this baby. Mom wasn't successful w/her other 2 children. Mom stated she didn't have enough milk had to use formula. Mom is in ICU but had the RN call for Lactation to bring DEBP to make sure she has it.  LC took DEBP and kit, set it up. At first mom stated she wanted to pump now, then she was in pain and so sleepy mom asked if she could pump tomorrow, LC stated yes. Then mom stated well lets go ahead and do it.  So LC LC pump up to mom's breast, she tried to hold one but kept falling asleep.  Mom is medicated and kept flailing her arms at intervals in her sleep. Mom stated lets just do it tomorrow after mom about knocked off the pump while working. LC encouraged mom to rest. Noted drops of colostrum. LC collected the drops and took to baby and spoon fed the drops.  LPI information sheet on counter. LC gave to FOB and suggested he reads about LPI. Encouraged FOB to rest while baby sleeping and set clock for feeding. FOB gave cell phone to [facility] to give to mom. LC plugged phone on charger for mom to call FOB.  Maternal Data Has patient been taught Hand Expression?: No Does the patient have breastfeeding experience prior to this delivery?: Yes How long did the patient breastfeed?: mom to sleepy but stated she tried but couldn't and didn't have enough milk.  Feeding    LATCH Score       Type of Nipple: Everted at rest and after stimulation  Comfort (Breast/Nipple): Soft / non-tender         Lactation Tools Discussed/Used Tools: Pump Breast pump type: Double-Electric Breast Pump Pump Education:  ([place] tried to review but mom kept falling asleep.) Reason for Pumping: LPI/ICU/seperation Pumping frequency: q3hr/but  when ever she can and feels like it Pumped volume:  (drops)  Interventions Interventions: DEBP;Hand express;LC Services brochure;LPT handout/interventions  Discharge    Consult Status Consult Status: Follow-up Date: 09/12/22 Follow-up type: In-patient    Theodoro Kalata 09/12/2022, 2:24 AM

## 2022-09-12 NOTE — Progress Notes (Signed)
Echocardiogram 2D Echocardiogram has been performed.  [name] 09/12/2022, 5:17 PM

## 2022-09-12 NOTE — Anesthesia Postprocedure Evaluation (Signed)
Anesthesia Post Note  Patient: [name]  Procedure(s) Performed: CESAREAN SECTION (Abdomen)     Patient location during evaluation: ICU Anesthesia Type: Combined Spinal/Epidural Level of consciousness: awake and alert and oriented Pain management: pain level controlled Vital Signs Assessment: post-procedure vital signs reviewed and stable Respiratory status: spontaneous breathing, nonlabored ventilation and respiratory function stable Cardiovascular status: blood pressure returned to baseline and stable Postop Assessment: no headache, no backache, spinal receding and patient able to bend at knees Anesthetic complications: no Comments: Transported directly to MICU postoperatively d/t HF w/ MR. Epidural pulled prior to transport, arterial line still in, no issues.   No notable events documented.  Last Vitals:  Vitals:   09/12/22 0140 09/12/22 0145  BP:    Pulse: 82 80  Resp: 14 16  Temp:    SpO2: 99% 99%    Last Pain:  Vitals:   09/12/22 0204  PainSc: 0-No pain   Pain Goal:    LLE Motor Response: Purposeful movement (09/12/22 0140) LLE Sensation: Numbness (09/12/22 0140) RLE Motor Response: Purposeful movement (09/12/22 0140) RLE Sensation: Numbness (09/12/22 0140) L Sensory Level: T10-Umbilical region (0000000 0140) R Sensory Level: T10-Umbilical region (0000000 0140) Epidural/Spinal Function Cutaneous sensation: Able to Wiggle Toes (09/12/22 [id number]), Patient able to flex knees: Yes (09/12/22 0204), Patient able to lift hips off bed: No (09/12/22 0204), Back pain beyond tenderness at insertion site: No (09/12/22 0204), Progressively worsening motor and/or sensory loss: No (09/12/22 0204), Bowel and/or bladder incontinence post epidural: No (09/12/22 0204)  Pervis Hocking

## 2022-09-13 ENCOUNTER — Encounter: Payer: Medicaid Other | Admitting: Obstetrics and Gynecology

## 2022-09-13 DIAGNOSIS — R578 Other shock: Secondary | ICD-10-CM

## 2022-09-13 DIAGNOSIS — T8119XA Other postprocedural shock, initial encounter: Secondary | ICD-10-CM

## 2022-09-13 DIAGNOSIS — Z98891 History of uterine scar from previous surgery: Secondary | ICD-10-CM | POA: Diagnosis not present

## 2022-09-13 DIAGNOSIS — I519 Heart disease, unspecified: Secondary | ICD-10-CM

## 2022-09-13 LAB — CBC
HCT: 19.3 % — ABNORMAL LOW (ref 36.0–46.0)
HCT: 23.7 % — ABNORMAL LOW (ref 36.0–46.0)
Hemoglobin: 7 g/dL — ABNORMAL LOW (ref 12.0–15.0)
Hemoglobin: 8.4 g/dL — ABNORMAL LOW (ref 12.0–15.0)
MCH: 28.1 pg (ref 26.0–34.0)
MCH: 27.7 pg (ref 26.0–34.0)
MCHC: 36.3 g/dL — ABNORMAL HIGH (ref 30.0–36.0)
MCHC: 35.4 g/dL (ref 30.0–36.0)
MCV: 77.5 fL — ABNORMAL LOW (ref 80.0–100.0)
MCV: 78.2 fL — ABNORMAL LOW (ref 80.0–100.0)
Platelets: 137 10*3/uL — ABNORMAL LOW (ref 150–400)
Platelets: 158 10*3/uL (ref 150–400)
RBC: 2.49 MIL/uL — ABNORMAL LOW (ref 3.87–5.11)
RBC: 3.03 MIL/uL — ABNORMAL LOW (ref 3.87–5.11)
RDW: 14.3 % (ref 11.5–15.5)
RDW: 15.2 % (ref 11.5–15.5)
WBC: 11.2 10*3/uL — ABNORMAL HIGH (ref 4.0–10.5)
WBC: 13 10*3/uL — ABNORMAL HIGH (ref 4.0–10.5)
nRBC: 0 % (ref 0.0–0.2)
nRBC: 0.2 % (ref 0.0–0.2)

## 2022-09-13 LAB — DIC (DISSEMINATED INTRAVASCULAR COAGULATION)PANEL
D-Dimer, Quant: 6.23 ug/mL-FEU — ABNORMAL HIGH (ref 0.00–0.50)
Fibrinogen: 356 mg/dL (ref 210–475)
INR: 1 (ref 0.8–1.2)
Platelets: 144 10*3/uL — ABNORMAL LOW (ref 150–400)
Prothrombin Time: 13.2 seconds (ref 11.4–15.2)
Smear Review: NONE SEEN
aPTT: 27 seconds (ref 24–36)

## 2022-09-13 LAB — COMPREHENSIVE METABOLIC PANEL
ALT: 36 U/L (ref 0–44)
AST: 71 U/L — ABNORMAL HIGH (ref 15–41)
Albumin: 2.3 g/dL — ABNORMAL LOW (ref 3.5–5.0)
Alkaline Phosphatase: 47 U/L (ref 38–126)
Anion gap: 8 (ref 5–15)
BUN: 13 mg/dL (ref 6–20)
CO2: 23 mmol/L (ref 22–32)
Calcium: 7.6 mg/dL — ABNORMAL LOW (ref 8.9–10.3)
Chloride: 100 mmol/L (ref 98–111)
Creatinine, Ser: 0.74 mg/dL (ref 0.44–1.00)
GFR, Estimated: >60 mL/min (ref >60)
Glucose, Bld: 135 mg/dL — ABNORMAL HIGH (ref 70–99)
Potassium: 4.7 mmol/L (ref 3.5–5.1)
Sodium: 131 mmol/L — ABNORMAL LOW (ref 135–145)
Total Bilirubin: 0.7 mg/dL (ref 0.3–1.2)
Total Protein: 4.8 g/dL — ABNORMAL LOW (ref 6.5–8.1)

## 2022-09-13 LAB — LACTIC ACID, PLASMA
Lactic Acid, Venous: 2.7 mmol/L (ref 0.5–1.9)
Lactic Acid, Venous: 2.6 mmol/L (ref 0.5–1.9)

## 2022-09-13 LAB — PREPARE RBC (CROSSMATCH)

## 2022-09-13 MED ORDER — SODIUM CHLORIDE 0.9% IV SOLUTION: Freq: Once | INTRAVENOUS | Status: DC

## 2022-09-13 MED ORDER — METOPROLOL SUCCINATE 12.5 MG HALF TABLET
12.5000 mg | ORAL_TABLET | Freq: Every day | ORAL | Status: DC
  Administered 2022-09-13 – 2022-09-17 (×5): 12.5 mg via ORAL
  Filled 2022-09-13 (×6): qty 1

## 2022-09-13 MED ORDER — IBUPROFEN 600 MG PO TABS
600.0000 mg | ORAL_TABLET | Freq: Four times a day (QID) | ORAL | Status: DC | PRN
  Administered 2022-09-14 – 2022-09-17 (×8): 600 mg via ORAL
  Filled 2022-09-13: qty 3
  Filled 2022-09-13 (×7): qty 1

## 2022-09-13 NOTE — Progress Notes (Signed)
NAME:  [name], MRN:  [id number], DOB:  1986/03/04, LOS: 1 ADMISSION DATE:  09/11/2022, CONSULTATION DATE:  3/26 REFERRING MD:  Dr Elonda Husky (OB), CHIEF COMPLAINT:  pre-eclampsia   History of Present Illness:  37 year old female with PMH as below, which is significant for moderate mitral regurgitation, HFrEF (LVEF 35-40%), and pre-eclampsia. She is followed by cardiology in the outpatient setting and is treated with metoprolol and ASA daily. There was a scheduled cesarean planned for 4/2. She presented to [facility] hospital 3/26 as a [id number] with active IUP at [redacted]w[redacted]d in active labor. She was noted to be hypertensive with BP 161/100 and was started on magnesium and labetalol. She was taken for cesarean delivery, which was uncomplicated and she was transferred to ICU post-operatively for close monitoring. PCCM consulted to assist with ICU care.   Pertinent  Medical History  has a past medical history of Bacterial infection, History of chicken pox, and Systolic heart failure ([place]).   Significant Hospital Events: Including procedures, antibiotic start and stop dates in addition to other pertinent events   3/26 admit in active labor, taken for cesarean. To ICU post-op for close monitoring.   Interim History / Subjective:  Patient is sitting up at bedside during examination.  She appears comfortable in no acute distress.  She stated she is feels better but still has persistent lightheadedness.  She denies any extensive vaginal bleeding.  She is tolerating p.o. intake okay.  Objective   Blood pressure (!) 142/97, pulse (!) 106, temperature 98.3 F (36.8 C), temperature source Oral, resp. rate 20, height 5\' 4"  (1.626 m), weight 66.2 kg, last menstrual period 01/02/2022, SpO2 99 %, unknown if currently breastfeeding.        Intake/Output Summary (Last 24 hours) at 09/13/2022 0814 Last data filed at 09/13/2022 0804 Gross per 24 hour  Intake 5454.04 ml  Output 2085 ml  Net 3369.04 ml   Filed  Weights   09/12/22 0115  Weight: 66.2 kg    Examination: Physical Exam Constitutional:      General: She is not in acute distress.    Appearance: She is not ill-appearing.  HENT:     Head: Normocephalic.  Eyes:     General:        Right eye: No discharge.        Left eye: No discharge.     Conjunctiva/sclera: Conjunctivae normal.  Cardiovascular:     Rate and Rhythm: Regular rhythm. Tachycardia present.  Pulmonary:     Effort: Pulmonary effort is normal. No respiratory distress.     Breath sounds: Normal breath sounds. No wheezing.  Musculoskeletal:     Right lower leg: No edema.     Left lower leg: No edema.  Skin:    General: Skin is warm.  Neurological:     Mental Status: She is alert. Mental status is at baseline.  Psychiatric:        Mood and Affect: Mood normal.      Resolved Hospital Problem list     Assessment & Plan:  Acute blood loss anemia Hemorrhagic shock Lactic acidosis CT abdomen/pelvis showed area of hemorrhage within the peritoneal and Extraperitoneal space.  It seems like her bleeding has slowed down or stopped.  Her blood pressure and her tachycardia are improving.  Agree with to continue supportive management at this time.  -Transfuse 1 more unit this morning.  Hemoglobin at 7 but could be a component of hemodilution. -Continue to hold Lovenox for DVT prophylaxis -  Stop vancomycin.  Continue cefepime today.  If she remains afebrile, will DC cefepime tomorrow.   Pre-eclampsia with severe features: s/p cesarean delivery 3/27 early AM @ 36 wk.   Keep SBP < 160 mmHg and DBP < 100 mmHg. Goal < 135/85 - PRN labetalol and hydralazine - IV Keppra for seizure prophylaxis  Chronic HFrEF (LVEF 35-40%) Mod - Severe MR She follows cardiology outpatient.  Repeat TTE showed EF slightly down 30-35%.  No evidence of cardiogenic shock.  Cardiology is following. -Resume metoprolol today. -Can consider adding GDMT when more stable -Plan was to pursue further  workup with TEE or coronary CTA after pregnancy.    Best Practice (right click and "Reselect all SmartList Selections" daily)   Diet/type: Regular consistency (see orders) DVT prophylaxis: SCD GI prophylaxis: N/A Lines: N/A Foley:  Yes, and it is still needed Code Status:  full code Last date of multidisciplinary goals of care discussion [updated family at bedside]  Labs   CBC: Recent Labs  Lab 09/11/22 2148 09/12/22 0500 09/12/22 0637 09/12/22 1435 09/12/22 2024 09/13/22 0253  WBC 10.1 16.1*  --   --  12.6* 11.2*  HGB 11.1* 8.3*  --  8.1* 8.2* 7.0*  HCT 33.5* 24.1*  --  23.8* 24.0* 19.3*  MCV 75.6* 75.1*  --   --  80.0 77.5*  PLT 336 297 278  --  154 144*  137*    Basic Metabolic Panel: Recent Labs  Lab 09/11/22 2148 09/12/22 1435 09/13/22 0253  NA 138 132* 131*  K 4.2 5.1 4.7  CL 104 101 100  CO2 22 19* 23  GLUCOSE 89 117* 135*  BUN 13 18 13   CREATININE 0.71 1.04* 0.74  CALCIUM 9.0 7.0* 7.6*   GFR: Estimated Creatinine Clearance: 91 mL/min (by C-G formula based on SCr of 0.74 mg/dL). Recent Labs  Lab 09/11/22 2148 09/12/22 0500 09/12/22 1435 09/12/22 1636 09/12/22 2024 09/13/22 0253  WBC 10.1 16.1*  --   --  12.6* 11.2*  LATICACIDVEN  --   --  5.5* 3.8* 4.1*  --     Liver Function Tests: Recent Labs  Lab 09/11/22 2148 09/12/22 1435 09/13/22 0253  AST 44* 61* 71*  ALT 39 31 36  ALKPHOS 97 58 47  BILITOT 0.4 1.5* 0.7  PROT 6.7 5.4* 4.8*  ALBUMIN 3.0* 2.7* 2.3*   No results for input(s): "LIPASE", "AMYLASE" in the last 168 hours. No results for input(s): "AMMONIA" in the last 168 hours.  ABG    Component Value Date/Time   HCO3 19.7 (L) 09/12/2022 1659   ACIDBASEDEF 6.5 (H) 09/12/2022 1659   O2SAT 34.9 09/12/2022 1659     Coagulation Profile: Recent Labs  Lab 09/12/22 0637 09/12/22 2024 09/13/22 0253  INR 1.1 1.1 1.0    Cardiac Enzymes: No results for input(s): "CKTOTAL", "CKMB", "CKMBINDEX", "TROPONINI" in the last 168  hours.  HbA1C: No results found for: "HGBA1C"  CBG: Recent Labs  Lab 09/12/22 0057  GLUCAP 123*    Review of Systems:     Past Medical History:  She,  has a past medical history of Bacterial infection, History of chicken pox, and Systolic heart failure ([place]).   Surgical History:   Past Surgical History:  Procedure Laterality Date   CESAREAN SECTION     CESAREAN SECTION N/A 02/02/2015   Procedure: CESAREAN SECTION;  Surgeon: Thurnell Lose, MD;  Location: [facility];  Service: Obstetrics;  Laterality: N/A;   CESAREAN SECTION N/A 09/11/2022   Procedure: CESAREAN SECTION;  Surgeon: Florian Buff, MD;  Location: [facility];  Service: Obstetrics;  Laterality: N/A;   DILATION AND CURETTAGE OF UTERUS     WISDOM TOOTH EXTRACTION       Social History:   reports that she has never smoked. She has never used smokeless tobacco. She reports that she does not drink alcohol and does not use drugs.   Family History:  Her family history includes Diabetes in her father; Hypertension in her father, mother, and paternal aunt. There is no history of Asthma, Cancer, Heart disease, or Stroke.   Allergies Allergies  Allergen Reactions   Other Other (See Comments)   Penicillins Rash     Home Medications  Prior to Admission medications   Medication Sig Start Date End Date Taking? Authorizing Provider  metoprolol succinate (TOPROL XL) 25 MG 24 hr tablet Take 0.5 tablets (12.5 mg total) by mouth daily. 08/01/22  Yes Freada Bergeron, MD  aspirin (ASPIRIN CHILDRENS) 81 MG chewable tablet Chew 1 tablet (81 mg total) by mouth daily. 03/24/22   Rasch, Anderson Malta I, NP  ferrous sulfate 325 (65 FE) MG EC tablet Take 1 tablet (325 mg total) by mouth every other day. Patient not taking: Reported on 09/05/2022 08/30/22   Radene Gunning, MD  omeprazole (PRILOSEC OTC) 20 MG tablet Take 1 tablet (20 mg total) by mouth daily. Patient not taking: Reported on 09/05/2022 08/16/22   Radene Gunning, MD  Prenatal MV & Min  w/FA-DHA ([employer] PRENATAL GUMMY) 0.18-25 MG CHEW Chew 1 tablet by mouth daily. 03/24/22   Rasch, Artist Pais, NP     Critical care time:    Gaylan Gerold, DO Internal Medicine Residency My pager: [phone]

## 2022-09-13 NOTE — Progress Notes (Addendum)
Rounding Note    Patient Name: [name] Date of Encounter: 09/13/2022  [place] Cardiologist: Freada Bergeron, MD   Subjective   37 yof with a hx of HFrEF, MR, preeclampsia who was admitted for scheduled ceserean delivery. Underwent delivery and subsequently transferred to ICU for closer monitoring.   She is feeling better this morning. Has not had any chest pain. Has ambulated some, but would feel fatigued doing so. Does think she is improving.    Inpatient Medications    Scheduled Meds:  sodium chloride   Intravenous Once   acetaminophen  1,000 mg Oral Q6H   Chlorhexidine Gluconate Cloth  6 each Topical Q0600   gabapentin  200 mg Oral QHS   metoprolol succinate  12.5 mg Oral Daily   prenatal multivitamin  1 tablet Oral Q1200   senna-docusate  2 tablet Oral Daily   Continuous Infusions:  sodium chloride 10 mL/hr at 09/13/22 0700   ceFEPime (MAXIPIME) IV 2 g (09/13/22 1023)   lactated ringers Stopped (09/12/22 1453)   levETIRAcetam 500 mg (09/13/22 0908)   PRN Meds: chewing gum (ORBIT) sugar free, coconut oil, witch hazel-glycerin **AND** dibucaine, diphenhydrAMINE, labetalol **AND** labetalol **AND** labetalol **AND** hydrALAZINE **AND** Measure blood pressure, HYDROmorphone (DILAUDID) injection, [EXPIRED] ketorolac **FOLLOWED BY** ibuprofen, magnesium hydroxide, menthol-cetylpyridinium, mouth rinse, oxyCODONE, simethicone, zolpidem   Vital Signs    Vitals:   09/13/22 0730 09/13/22 0745 09/13/22 0749 09/13/22 1218  BP:      Pulse: 98 (!) 109 (!) 106   Resp: 17 19 20    Temp:   98.3 F (36.8 C) 97.9 F (36.6 C)  TempSrc:   Oral Oral  SpO2: 99% 100% 99%   Weight:      Height:        Intake/Output Summary (Last 24 hours) at 09/13/2022 1234 Last data filed at 09/13/2022 0804 Gross per 24 hour  Intake 4209.24 ml  Output 2010 ml  Net 2199.24 ml      09/12/2022    1:15 AM 09/05/2022    2:28 PM 09/05/2022   12:43 PM  Last 3 Weights   Weight (lbs) 145 lb 15.1 oz 151 lb 149 lb  Weight (kg) 66.2 kg 68.493 kg 67.586 kg      Telemetry    Sinus rhythm, no longer tachycardic - Personally Reviewed  ECG    Sinus rhythm, tachycardic, with non-specific t wave changes - Personally Reviewed  Physical Exam   GEN: Lethargic Neck: No JVD Cardiac: RRR, 3/6 outflow murmur, increased compared to yesterday Respiratory: Clear to auscultation bilaterally. GI: Soft, nontender, non-distended  MS: No edema; No deformity. Neuro:  Nonfocal  Psych: Normal affect   Labs    High Sensitivity Troponin:   Recent Labs  Lab 09/12/22 1435 09/12/22 1636  TROPONINIHS 19* 16     Chemistry Recent Labs  Lab 09/11/22 2148 09/12/22 1435 09/13/22 0253  NA 138 132* 131*  K 4.2 5.1 4.7  CL 104 101 100  CO2 22 19* 23  GLUCOSE 89 117* 135*  BUN 13 18 13   CREATININE 0.71 1.04* 0.74  CALCIUM 9.0 7.0* 7.6*  PROT 6.7 5.4* 4.8*  ALBUMIN 3.0* 2.7* 2.3*  AST 44* 61* 71*  ALT 39 31 36  ALKPHOS 97 58 47  BILITOT 0.4 1.5* 0.7  GFRNONAA >60 >60 >60  ANIONGAP 12 12 8     Lipids No results for input(s): "CHOL", "TRIG", "HDL", "LABVLDL", "LDLCALC", "CHOLHDL" in the last 168 hours.  Hematology Recent Labs  Lab  09/12/22 0500 09/12/22 0637 09/12/22 1435 09/12/22 2024 09/13/22 0253  WBC 16.1*  --   --  12.6* 11.2*  RBC 3.21*  --   --  3.00* 2.49*  HGB 8.3*  --  8.1* 8.2* 7.0*  HCT 24.1*  --  23.8* 24.0* 19.3*  MCV 75.1*  --   --  80.0 77.5*  MCH 25.9*  --   --  27.3 28.1  MCHC 34.4  --   --  34.2 36.3*  RDW 13.9  --   --  14.4 14.3  PLT 297 278  --  154 144*  137*   Thyroid No results for input(s): "TSH", "FREET4" in the last 168 hours.  BNPNo results for input(s): "BNP", "PROBNP" in the last 168 hours.  DDimer  Recent Labs  Lab 09/12/22 [id number] 09/13/22 0253  DDIMER 5.77* 6.23*     Radiology    CT ABDOMEN PELVIS WO CONTRAST  Result Date: 09/12/2022 CLINICAL DATA:  Sepsis. Status post low transverse cesarean section  09/11/2022 EXAM: CT ABDOMEN AND PELVIS WITHOUT CONTRAST TECHNIQUE: Multidetector CT imaging of the abdomen and pelvis was performed following the standard protocol without IV contrast. RADIATION DOSE REDUCTION: This exam was performed according to the departmental dose-optimization program which includes automated exposure control, adjustment of the mA and/or kV according to patient size and/or use of iterative reconstruction technique. COMPARISON:  None Available. FINDINGS: Lower chest: There is some linear opacity at the bases likely scar or atelectasis. No pleural effusion. Patulous lower esophagus. Hepatobiliary: The top of the liver is clipped off the edge of the imaging field. On this non IV contrast exam the liver is grossly preserved. The gallbladder is mildly distended. Pancreas: Unremarkable. No pancreatic ductal dilatation or surrounding inflammatory changes. Spleen: Normal in size without focal abnormality. Adrenals/Urinary Tract: The adrenal glands are preserved. No abnormal calcifications are seen within either kidney nor along the expected course of either ureter. Bladder is contracted with a Foley catheter. Stomach/Bowel: Small amount of high density debris in the lumen of the stomach. The stomach is nondilated. Scattered colonic stool. The large bowel is nondilated. There is a normal caliber appendix in the right lower quadrant. Small bowel is nondilated. No oral contrast. Vascular/Lymphatic: Grossly the aorta and IVC have normal course and caliber. The IVC is more collapsed than usually seen. Please correlate with patient's hematocrit. No discrete abnormal lymph node enlargement on this noncontrast examination in the abdomen and pelvis. Reproductive: Postpartum enlarged uterus measuring proximally 17.2 by 8.8 by 11.4 cm. There is some air noted along the lower anterior uterus consistent with the surgical change. Adjacent gas as well in the extraperitoneal space. Other: Complex areas of fluid are  seen throughout the abdomen and pelvis, moderate. Areas extend upper along the liver and spleen as well as along the pericolic gutters and near the greater curve of the stomach. There is also areas of high density specifically in the extraperitoneal space. Components of hematoma. Musculoskeletal: Slight curvature of the spine. There is some air in the epidural space of the mid lumbar spine. This could be venous. Please correlate for any history of intervention. Anterior abdominal wall and pelvic wall soft tissue thickening, gas and skin thickening consistent with a postsurgical state. IMPRESSION: Enlarged postpartum uterus with changes noted of recent cesarean section. Moderate scattered free fluid with areas of high density consistent with areas of hemorrhage. This includes within the peritoneal space extending up along the liver, spleen and pericolic gutters as well as in the extraperitoneal  space anteriorly and retroperitoneal. IVC appears collapsed. Few scattered areas of air also identified but these easily could be postsurgical. In principle infection is difficult to exclude on the basis of CT scan alone. No bowel obstruction.  Normal appendix. Critical Value/emergent results were called by telephone at the time of interpretation on 09/12/2022 at 3:20 pm to provider Dr. Alfonse Spruce, who verbally acknowledged these results. Electronically Signed   By: Jill Side M.D.   On: 09/12/2022 18:22   ECHOCARDIOGRAM LIMITED  Result Date: 09/12/2022    ECHOCARDIOGRAM LIMITED REPORT   Patient Name:   TOULA CHARBONNET Date of Exam: 09/12/2022 Medical Rec #:  [id number]        Height:       64.0 in Accession #:    [id number]       Weight:       145.9 lb Date of Birth:  [birth year]        BSA:          41.711 m Patient Age:    80 years         BP:           100/76 mmHg Patient Gender: F                HR:           104 bpm. Exam Location:  Inpatient Procedure: Limited Echo, Cardiac Doppler, Limited Color Doppler and Intracardiac             Opacification Agent Indications:    Abnormal ECG R94.31                 Cardiomyopathy-Unspecified I42.9  History:        Patient has prior history of Echocardiogram examinations, most                 recent 09/07/2022. CHF; Maternal mitral valve regurgitation                 affecting pregnancy in third trimester, antepartum.  Sonographer:    Greer Pickerel Referring Phys: [id number] Florian Buff  Sonographer Comments: Image acquisition challenging due to patient body habitus and Image acquisition challenging due to respiratory motion. IMPRESSIONS  1. Left ventricular ejection fraction, by estimation, is 30 to 35%. The left ventricle has moderately decreased function. The left ventricle demonstrates global hypokinesis. There is moderate asymmetric left ventricular hypertrophy of the inferior segment.  2. Right ventricular systolic function is normal. The right ventricular size is normal.  3. The mitral valve is normal in structure. Mild mitral valve regurgitation. No evidence of mitral stenosis.  4. The aortic valve is normal in structure. Aortic valve regurgitation is not visualized. No aortic stenosis is present.  5. The inferior vena cava is normal in size with greater than 50% respiratory variability, suggesting right atrial pressure of 3 mmHg. FINDINGS  Left Ventricle: Left ventricular ejection fraction, by estimation, is 30 to 35%. The left ventricle has moderately decreased function. The left ventricle demonstrates global hypokinesis. Definity contrast agent was given IV to delineate the left ventricular endocardial borders. The left ventricular internal cavity size was normal in size. There is moderate asymmetric left ventricular hypertrophy of the inferior segment. Right Ventricle: The right ventricular size is normal. No increase in right ventricular wall thickness. Right ventricular systolic function is normal. Left Atrium: Left atrial size was normal in size. Right Atrium: Right atrial size was normal  in size. Pericardium: There is no evidence of pericardial effusion. Mitral  Valve: The mitral valve is normal in structure. Mild mitral valve regurgitation. No evidence of mitral valve stenosis. Tricuspid Valve: The tricuspid valve is normal in structure. Tricuspid valve regurgitation is not demonstrated. No evidence of tricuspid stenosis. Aortic Valve: The aortic valve is normal in structure. Aortic valve regurgitation is not visualized. No aortic stenosis is present. Pulmonic Valve: The pulmonic valve was normal in structure. Pulmonic valve regurgitation is not visualized. No evidence of pulmonic stenosis. Aorta: The aortic root is normal in size and structure. Venous: The inferior vena cava is normal in size with greater than 50% respiratory variability, suggesting right atrial pressure of 3 mmHg. IAS/Shunts: No atrial level shunt detected by color flow Doppler. Additional Comments: Spectral Doppler performed. Color Doppler performed.  LEFT VENTRICLE PLAX 2D LVIDd:         4.40 cm LVIDs:         4.30 cm LV PW:         1.70 cm LV IVS:        0.90 cm  LV Volumes (MOD) LV vol d, MOD A2C: 34.9 ml LV vol d, MOD A4C: 65.1 ml LV vol s, MOD A2C: 28.9 ml LV vol s, MOD A4C: 48.9 ml LV SV MOD A2C:     6.0 ml LV SV MOD A4C:     65.1 ml LV SV MOD BP:      8.1 ml MITRAL VALVE MV Area (PHT): 3.89 cm MV Decel Time: 195 msec MR Peak grad: 77.4 mmHg MR Vmax:      440.00 cm/s MV E velocity: 86.50 cm/s MV A velocity: 56.10 cm/s MV E/A ratio:  1.54 Kardie Tobb DO Electronically signed by Berniece Salines DO Signature Date/Time: 09/12/2022/5:25:06 PM    Final     Cardiac Studies   ECHO: IMPRESSIONS   1. Left ventricular ejection fraction, by estimation, is 30 to 35%. The  left ventricle has moderately decreased function. The left ventricle  demonstrates global hypokinesis. There is moderate asymmetric left  ventricular hypertrophy of the inferior  segment.   2. Right ventricular systolic function is normal. The right ventricular   size is normal.   3. The mitral valve is normal in structure. Mild mitral valve  regurgitation. No evidence of mitral stenosis.   4. The aortic valve is normal in structure. Aortic valve regurgitation is  not visualized. No aortic stenosis is present.   5. The inferior vena cava is normal in size with greater than 50%  respiratory variability, suggesting right atrial pressure of 3 mmHg.   Patient Profile     37 y.o. 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 Patient became hypotensive afterward requiring IV fluids, blood products, and Norepi to maintain pressure. ECHO was repeated and demonstrated interval decrease in EF to 30-35%. Cardiology was consulted due to concern for cardiogenic shock/post-partum cardiomyopathy.   Assessment & Plan    # Hemorrhagic Shock MAPs have been improving after fluids, blood products, and pressor support. Hgb remains low at 7.0, possibly dilutional component since she has been receiving fluids. Got additional unit prbc overnight for hgb. Has required 4 units of PRBCs thus far. MAPS stable today.  Her oxytocin has been turned off and no longer requiring levophed. Shock most likely driven by hemorrhage. Seems to be stabilizing. Now on hypertensive side.  - continue to monitor Hgb.  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 Her decline in EF likely not the cause of her hypotension yesterday. She is stable hemodynamically at this point. Blood pressure has been high end and slightly tachycardic. Her metoprolol can safely be added back at this time, but would begin at low dose 12.5 and slowly increase. Her EF can be followed up in outpatient setting. No indication for repeat ECHO unless clinical change.  - Continue to monitor on telemetry.  - start metoprolol - cardiology will sign off  for now. She can follow up with Dr. Johney Frame as an outpatient.   # Mitral Regurgitation Interestingly, this appears improved from moderate/severe to mild on ECHO yesterday. Today, much more audible on physical exam. Likely functional murmur. Further workup of this is planned as an outpatient.   For questions or updates, please contact [place] Please consult [email or web address] for contact info under   Signed, Delene Ruffini, MD  09/13/2022, 12:34 PM     Agree with A & P by Dr Elliot Gurney  Pt appears stable this AM. No SOB. Sats 99%. BP better off of Levo. HGB 7 which is concerning despite 4 units PRBCs. Results of abd CT noted and OB aware. No plans for surg. 2D showed worsening of EF however MR was only mild. OK to start BB. BP will allow. Nothing further to add. Will sign off. Follow up with Dr Johney Frame at [place] for initiation of GDMT.  Lorretta Harp, M.D., [place], [facility], [place] [address] [place], [place]  [id number]  [phone] 09/13/2022 4:54 PM

## 2022-09-13 NOTE — Progress Notes (Signed)
Patient ID: [name], female   DOB: 1985/11/28, 37 y.o.   MRN: [id number] 2 Days Post-Op Procedure(s) (LRB): CESAREAN SECTION (N/A)  [name] is a 37 y.o. female patient.   [name] is a 37 y.o. female patient.  POD#1 repeat C section(previous x 2) with severe pelvic abdominal wall adhesions to the lower uterine segment Has history of bladder injury with her last C section   Post op hypotension and tachycardia with drop in hemoglobin, felt likely to be due to post op peritoneal oozing from taking down the adhesions to be able to deliver the baby Was hemostatic leaving OR but suspected this earlier today(0600) and confirmed by clinical picture throughout the day and by CT scan   This am the patient clinically looks a lot better Her UOP is doing well this am, 650 cc in 2 hours  DIC panel is stable with no consumptive coagulopathy  Past Medical History:  Diagnosis Date   Bacterial infection    History of chicken pox    Systolic heart failure ([place])     No past surgical history pertinent negatives on file.  Scheduled Meds:  sodium chloride   Intravenous Once   acetaminophen  1,000 mg Oral Q6H   Chlorhexidine Gluconate Cloth  6 each Topical Q0600   gabapentin  200 mg Oral QHS   ibuprofen  600 mg Oral Q6H   prenatal multivitamin  1 tablet Oral Q1200   senna-docusate  2 tablet Oral Daily   simethicone  80 mg Oral TID PC    Continuous Infusions:  sodium chloride 10 mL/hr at 09/13/22 0600   ceFEPime (MAXIPIME) IV Stopped (09/13/22 0133)   lactated ringers Stopped (09/12/22 1453)   lactated ringers 125 mL/hr at 09/13/22 0600   levETIRAcetam Stopped (09/12/22 2227)   norepinephrine (LEVOPHED) Adult infusion     vancomycin Stopped (09/13/22 0513)    PRN Meds:chewing gum (ORBIT) sugar free, coconut oil, witch hazel-glycerin **AND** dibucaine, diphenhydrAMINE, labetalol **AND** labetalol **AND** labetalol **AND** hydrALAZINE **AND** Measure blood pressure,  HYDROmorphone (DILAUDID) injection, magnesium hydroxide, menthol-cetylpyridinium, mouth rinse, oxyCODONE, simethicone, zolpidem  Allergies  Allergen Reactions   Other Other (See Comments)   Penicillins Rash    Principal Problem:   Status post repeat low transverse cesarean section Active Problems:   Supervision of high risk pregnancy, antepartum   History of gestational hypertension   Multigravida of advanced maternal age in third trimester   Systolic heart failure ([place])   Maternal mitral valve regurgitation affecting pregnancy in third trimester, antepartum   Previous cesarean delivery affecting pregnancy   Severe preeclampsia   Postoperative hemorrhagic shock   LV dysfunction   Subjective   Pt is awake and alert and feels much better Passed some gas ths am  Objective   Vitals:   09/13/22 0630 09/13/22 0645 09/13/22 0659 09/13/22 0700  BP: 124/83 (!) 145/92 (!) 132/92 (!) 132/92  Pulse: 88 97 100 98  Resp: 12 13 14 20   Temp:   98.5 F (36.9 C)   TempSrc:   Oral   SpO2: 98% 98% 99% 100%  Weight:      Height:       Vitals:   09/13/22 0445 09/13/22 0447 09/13/22 0500 09/13/22 0515  BP: 134/79 134/79 125/82 120/79   09/13/22 0530 09/13/22 0545 09/13/22 0600 09/13/22 0615  BP: 118/78 123/79 119/81 132/85   09/13/22 0630 09/13/22 0645 09/13/22 0659 09/13/22 0700  BP: 124/83 (!) 145/92 (!) 132/92 (!) 132/92     Subjective Objective:  Vital signs (most recent): Blood pressure (!) 132/92, pulse 98, temperature 98.5 F (36.9 C), temperature source Oral, resp. rate 20, height 5\' 4"  (1.626 m), weight 66.2 kg, last menstrual period 01/02/2022, SpO2 100 %, unknown if currently breastfeeding.   Gen  no distress this am, smiling talking feels better Abdomen  distended more so than yesterday and moderately tender Incision  dressing with stable amount of blood on the honeycomb  Bedside sonogram shows less fluid in the right pericolic gutter and stable on the left, appears to  be oragnaizing into clot now in the right gutter     Latest Ref Rng & Units 09/13/2022    2:53 AM 09/12/2022    8:24 PM 09/12/2022    2:35 PM  CBC  WBC 4.0 - 10.5 K/uL 11.2  12.6    Hemoglobin 12.0 - 15.0 g/dL 7.0  8.2  8.1   Hematocrit 36.0 - 46.0 % 19.3  24.0  23.8   Platelets 150 - 400 K/uL 150 - 400 K/uL 137    144  154         Latest Ref Rng & Units 09/13/2022    2:53 AM 09/12/2022    2:35 PM 09/11/2022    9:48 PM  CMP  Glucose 70 - 99 mg/dL 135  117  89   BUN 6 - 20 mg/dL 13  18  13    Creatinine 0.44 - 1.00 mg/dL 0.74  1.04  0.71   Sodium 135 - 145 mmol/L 131  132  138   Potassium 3.5 - 5.1 mmol/L 4.7  5.1  4.2   Chloride 98 - 111 mmol/L 100  101  104   CO2 22 - 32 mmol/L 23  19  22    Calcium 8.9 - 10.3 mg/dL 7.6  7.0  9.0   Total Protein 6.5 - 8.1 g/dL 4.8  5.4  6.7   Total Bilirubin 0.3 - 1.2 mg/dL 0.7  1.5  0.4   Alkaline Phos 38 - 126 U/L 47  58  97   AST 15 - 41 U/L 71  61  44   ALT 0 - 44 U/L 36  31  39      Assessment & Plan POD#2 3 peat c section with severe adhesions with post op intraperitoneal bleeding likely from denuded peritoneal surfaces:  Has now received 4 units of PRBD, no clotting factors as the speed of her bleeding has not consumed her fibrinogen it appears.  UOP is now significantly increased and her BP is 140/90s range, pulse 90s.  Recheck hemoglobin at 1500.  Continue chewing gum.  On empiric antibiotics will discuss with team about stopping  Severe pre eclampsia: BP coming up into mild range now continue keppra  CHF: lower EF yesterday likely from diminished pre load.  UOP excelelnt which points to better LV function and certainly not deteriorating.  Cardiology is following    Florian Buff, MD 09/13/2022, 7:32 AM

## 2022-09-14 ENCOUNTER — Ambulatory Visit: Payer: Medicaid Other | Admitting: Cardiology

## 2022-09-14 LAB — CBC
HCT: 20.5 % — ABNORMAL LOW (ref 36.0–46.0)
Hemoglobin: 6.9 g/dL — CL (ref 12.0–15.0)
MCH: 27.3 pg (ref 26.0–34.0)
MCHC: 33.7 g/dL (ref 30.0–36.0)
MCV: 81 fL (ref 80.0–100.0)
Platelets: 148 10*3/uL — ABNORMAL LOW (ref 150–400)
RBC: 2.53 MIL/uL — ABNORMAL LOW (ref 3.87–5.11)
RDW: 15.7 % — ABNORMAL HIGH (ref 11.5–15.5)
WBC: 10.8 10*3/uL — ABNORMAL HIGH (ref 4.0–10.5)
nRBC: 0.4 % — ABNORMAL HIGH (ref 0.0–0.2)

## 2022-09-14 LAB — COMPREHENSIVE METABOLIC PANEL
ALT: 51 U/L — ABNORMAL HIGH (ref 0–44)
AST: 82 U/L — ABNORMAL HIGH (ref 15–41)
Albumin: 2.4 g/dL — ABNORMAL LOW (ref 3.5–5.0)
Alkaline Phosphatase: 60 U/L (ref 38–126)
Anion gap: 7 (ref 5–15)
BUN: 14 mg/dL (ref 6–20)
CO2: 23 mmol/L (ref 22–32)
Calcium: 8.2 mg/dL — ABNORMAL LOW (ref 8.9–10.3)
Chloride: 106 mmol/L (ref 98–111)
Creatinine, Ser: 0.62 mg/dL (ref 0.44–1.00)
GFR, Estimated: >60 mL/min (ref >60)
Glucose, Bld: 99 mg/dL (ref 70–99)
Potassium: 4 mmol/L (ref 3.5–5.1)
Sodium: 136 mmol/L (ref 135–145)
Total Bilirubin: 0.5 mg/dL (ref 0.3–1.2)
Total Protein: 5.3 g/dL — ABNORMAL LOW (ref 6.5–8.1)

## 2022-09-14 LAB — PREPARE RBC (CROSSMATCH)

## 2022-09-14 MED ORDER — FUROSEMIDE 10 MG/ML IJ SOLN: 20.0000 mg | Freq: Once | INTRAMUSCULAR | Status: DC

## 2022-09-14 MED ORDER — FUROSEMIDE 20 MG PO TABS
20.0000 mg | ORAL_TABLET | Freq: Two times a day (BID) | ORAL | Status: DC
  Administered 2022-09-14 – 2022-09-15 (×4): 20 mg via ORAL
  Filled 2022-09-14 (×4): qty 1

## 2022-09-14 MED ORDER — ACETAMINOPHEN 500 MG PO TABS
1000.0000 mg | ORAL_TABLET | Freq: Four times a day (QID) | ORAL | Status: DC | PRN
  Administered 2022-09-14 – 2022-09-15 (×3): 1000 mg via ORAL
  Filled 2022-09-14 (×3): qty 2

## 2022-09-14 MED ORDER — LEVETIRACETAM 500 MG PO TABS
500.0000 mg | ORAL_TABLET | Freq: Two times a day (BID) | ORAL | Status: AC
  Administered 2022-09-14 – 2022-09-16 (×6): 500 mg via ORAL
  Filled 2022-09-14 (×7): qty 1

## 2022-09-14 MED ORDER — SODIUM CHLORIDE 0.9% IV SOLUTION: Freq: Once | INTRAVENOUS | Status: AC

## 2022-09-14 NOTE — Progress Notes (Signed)
Patient ID: [name], female   DOB: 1985-09-27, 37 y.o.   MRN: [id number] Hgb returned as 6.9  Pt feels weak and dizzy Discussed IV infusion vs PRBC's Pt desires  PRBC's

## 2022-09-14 NOTE — Progress Notes (Signed)
Patient ID: [name], female   DOB: 1985/07/07, 37 y.o.   MRN: [id number] 3 Days Post-Op Procedure(s) (LRB): CESAREAN SECTION (N/A)  [name] is a 37 y.o. female patient.   POD#2 Repeat C section, severe pelvic abdominal wall adhesions(previous section with bladder injury) CHF Severe pre eclampsia Post op hemorrhage due to peritoneal oozing-->resolved, stabilized  Past Medical History:  Diagnosis Date   Bacterial infection    History of chicken pox    Systolic heart failure ([facility])     No past surgical history pertinent negatives on file.  Scheduled Meds:  sodium chloride   Intravenous Once   acetaminophen  1,000 mg Oral Q6H   Chlorhexidine Gluconate Cloth  6 each Topical Q0600   gabapentin  200 mg Oral QHS   metoprolol succinate  12.5 mg Oral Daily   prenatal multivitamin  1 tablet Oral Q1200   senna-docusate  2 tablet Oral Daily    Continuous Infusions:  sodium chloride Stopped (09/14/22 0504)   ceFEPime (MAXIPIME) IV Stopped (09/14/22 0228)   lactated ringers Stopped (09/12/22 1453)    PRN Meds:chewing gum (ORBIT) sugar free, coconut oil, witch hazel-glycerin **AND** dibucaine, diphenhydrAMINE, labetalol **AND** labetalol **AND** labetalol **AND** hydrALAZINE **AND** Measure blood pressure, HYDROmorphone (DILAUDID) injection, [EXPIRED] ketorolac **FOLLOWED BY** ibuprofen, magnesium hydroxide, menthol-cetylpyridinium, mouth rinse, oxyCODONE, simethicone, zolpidem  Allergies  Allergen Reactions   Other Other (See Comments)   Penicillins Rash    Principal Problem:   Status post repeat low transverse cesarean section Active Problems:   Supervision of high risk pregnancy, antepartum   History of gestational hypertension   Multigravida of advanced maternal age in third trimester   Systolic heart failure ([facility])   Maternal mitral valve regurgitation affecting pregnancy in third trimester, antepartum   Previous cesarean delivery affecting pregnancy   Severe  preeclampsia   Postoperative hemorrhagic shock   LV dysfunction   Subjective   Feels good Some headache and visual changes BM x 2, passing gas Ambulatory Voiding without difficulty   Objective   Vitals:   09/14/22 0326 09/14/22 0400 09/14/22 0500 09/14/22 0600  BP:  (!) 141/89 119/67 122/85  Pulse: (!) 108 (!) 108 (!) 106 (!) 108  Resp: 14 15 14 13   Temp: 98.4 F (36.9 C)     TempSrc: Oral     SpO2: 93% 95% 95% (!) 89%  Weight:      Height:       Vitals:   09/13/22 1900 09/13/22 2000 09/13/22 2100 09/13/22 2300  BP: (!) 133/90 (!) 131/94 133/86 137/87   09/14/22 0016 09/14/22 0045 09/14/22 0100 09/14/22 0200  BP: (!) 142/78 138/80 (!) 116/97 134/83   09/14/22 0300 09/14/22 0400 09/14/22 0500 09/14/22 0600  BP: 124/80 (!) 141/89 119/67 122/85     Subjective Objective: Vital signs (most recent): Blood pressure 122/85, pulse (!) 108, temperature 98.4 F (36.9 C), temperature source Oral, resp. rate 13, height 5\' 4"  (1.626 m), weight 66.2 kg, last menstrual period 01/02/2022, SpO2 (!) 89 %, unknown if currently breastfeeding.   Gen  WDWN NAD Abdomen  soft less distended(less gas) appropriate post op exam Incision  stable dressing in place no change in drainage     Latest Ref Rng & Units 09/13/2022    3:56 PM 09/13/2022    2:53 AM 09/12/2022    8:24 PM  CBC  WBC 4.0 - 10.5 K/uL 13.0  11.2  12.6   Hemoglobin 12.0 - 15.0 g/dL 8.4  7.0  8.2   Hematocrit 36.0 -  46.0 % 23.7  19.3  24.0   Platelets 150 - 400 K/uL 158  137    144  154        Latest Ref Rng & Units 09/13/2022    2:53 AM 09/12/2022    2:35 PM 09/11/2022    9:48 PM  CMP  Glucose 70 - 99 mg/dL 135  117  89   BUN 6 - 20 mg/dL 13  18  13    Creatinine 0.44 - 1.00 mg/dL 0.74  1.04  0.71   Sodium 135 - 145 mmol/L 131  132  138   Potassium 3.5 - 5.1 mmol/L 4.7  5.1  4.2   Chloride 98 - 111 mmol/L 100  101  104   CO2 22 - 32 mmol/L 23  19  22    Calcium 8.9 - 10.3 mg/dL 7.6  7.0  9.0   Total Protein 6.5 -  8.1 g/dL 4.8  5.4  6.7   Total Bilirubin 0.3 - 1.2 mg/dL 0.7  1.5  0.4   Alkaline Phos 38 - 126 U/L 47  58  97   AST 15 - 41 U/L 71  61  44   ALT 0 - 44 U/L 36  31  39      Assessment & Plan POD#2;  Doing well with stable vitals with significantly improved hemoglobin CBC pending Stopped antibiotics, no evidence of infection BP stable on metoprolol, added lasix for fluid management Dr Johney Frame has signed off, no evidence of decompensation  Continue oral keppra for 3 more days(stopped Mag when pt was hypotensive)  Will transfer to [place], MD 09/14/2022, 6:41 AM

## 2022-09-15 LAB — BPAM RBC
Blood Product Expiration Date: 202403272359
Blood Product Expiration Date: 202403302359
Blood Product Expiration Date: 202404022359
Blood Product Expiration Date: 202404022359
Blood Product Expiration Date: 202404092359
Blood Product Expiration Date: 202404102359
Blood Product Expiration Date: 202404102359
ISSUE DATE / TIME: 202403270737
ISSUE DATE / TIME: 202403270942
ISSUE DATE / TIME: 202403271630
ISSUE DATE / TIME: 202403280423
ISSUE DATE / TIME: 202403291339
ISSUE DATE / TIME: 202403291632
ISSUE DATE / TIME: 202403292113
Unit Type and Rh: 1700
Unit Type and Rh: 7300
Unit Type and Rh: 7300
Unit Type and Rh: 7300
Unit Type and Rh: 7300
Unit Type and Rh: 7300
Unit Type and Rh: 7300

## 2022-09-15 LAB — TYPE AND SCREEN
ABO/RH(D): B POS
Antibody Screen: NEGATIVE
Unit division: 0
Unit division: 0
Unit division: 0
Unit division: 0
Unit division: 0
Unit division: 0
Unit division: 0

## 2022-09-15 LAB — COMPREHENSIVE METABOLIC PANEL
ALT: 102 U/L — ABNORMAL HIGH (ref 0–44)
AST: 182 U/L — ABNORMAL HIGH (ref 15–41)
Albumin: 2.5 g/dL — ABNORMAL LOW (ref 3.5–5.0)
Alkaline Phosphatase: 65 U/L (ref 38–126)
Anion gap: 9 (ref 5–15)
BUN: 11 mg/dL (ref 6–20)
CO2: 24 mmol/L (ref 22–32)
Calcium: 8.5 mg/dL — ABNORMAL LOW (ref 8.9–10.3)
Chloride: 103 mmol/L (ref 98–111)
Creatinine, Ser: 0.59 mg/dL (ref 0.44–1.00)
GFR, Estimated: >60 mL/min (ref >60)
Glucose, Bld: 86 mg/dL (ref 70–99)
Potassium: 3.8 mmol/L (ref 3.5–5.1)
Sodium: 136 mmol/L (ref 135–145)
Total Bilirubin: 1.1 mg/dL (ref 0.3–1.2)
Total Protein: 5.6 g/dL — ABNORMAL LOW (ref 6.5–8.1)

## 2022-09-15 LAB — CBC
HCT: 29.3 % — ABNORMAL LOW (ref 36.0–46.0)
Hemoglobin: 9.9 g/dL — ABNORMAL LOW (ref 12.0–15.0)
MCH: 27.2 pg (ref 26.0–34.0)
MCHC: 33.8 g/dL (ref 30.0–36.0)
MCV: 80.5 fL (ref 80.0–100.0)
Platelets: 148 10*3/uL — ABNORMAL LOW (ref 150–400)
RBC: 3.64 MIL/uL — ABNORMAL LOW (ref 3.87–5.11)
RDW: 15.4 % (ref 11.5–15.5)
WBC: 10.2 10*3/uL (ref 4.0–10.5)
nRBC: 1.4 % — ABNORMAL HIGH (ref 0.0–0.2)

## 2022-09-15 MED ORDER — NIFEDIPINE ER OSMOTIC RELEASE 30 MG PO TB24
30.0000 mg | ORAL_TABLET | Freq: Every day | ORAL | Status: DC
  Administered 2022-09-15: 30 mg via ORAL
  Filled 2022-09-15 (×2): qty 1

## 2022-09-15 MED ORDER — MEASLES, MUMPS & RUBELLA VAC IJ SOLR: 0.5000 mL | Freq: Once | INTRAMUSCULAR | Status: DC

## 2022-09-15 MED ORDER — FUROSEMIDE 10 MG/ML IJ SOLN
20.0000 mg | Freq: Once | INTRAMUSCULAR | Status: AC
  Administered 2022-09-15: 20 mg via INTRAVENOUS
  Filled 2022-09-15: qty 2

## 2022-09-15 MED ORDER — NIFEDIPINE ER OSMOTIC RELEASE 30 MG PO TB24
30.0000 mg | ORAL_TABLET | Freq: Two times a day (BID) | ORAL | Status: DC
  Administered 2022-09-15: 30 mg via ORAL
  Filled 2022-09-15: qty 1

## 2022-09-15 NOTE — Progress Notes (Signed)
Subjective: Postpartum Day 3: Cesarean Delivery Patient reports incisional pain and tolerating PO.    Objective: Vital signs in last 24 hours: Temp:  [97.7 F (36.5 C)-98.3 F (36.8 C)] 97.9 F (36.6 C) (03/30 0503) Pulse Rate:  [88-102] 95 (03/30 0503) Resp:  [16-19] 18 (03/30 0503) BP: (139-157)/(83-99) 152/95 (03/30 0503) SpO2:  [93 %-100 %] 99 % (03/30 0503) Vitals:   09/14/22 2133 09/14/22 2155 09/15/22 0011 09/15/22 0503  BP: (!) 153/99 (!) 157/91 (!) 155/93 (!) 152/95  Pulse: 94 92 88 95  Resp: 18 17 16 18   Temp: 98.3 F (36.8 C) 98 F (36.7 C) 98.3 F (36.8 C) 97.9 F (36.6 C)  TempSrc: Oral Oral Oral Oral  SpO2: 96% 100% 95% 99%  Weight:      Height:        Intake/Output Summary (Last 24 hours) at 09/15/2022 0805 Last data filed at 09/15/2022 0522 Gross per 24 hour  Intake 1812.5 ml  Output 2200 ml  Net -387.5 ml    Physical Exam:  General: alert, cooperative, and appears stated age 37: appropriate Uterine Fundus: firm Incision: healing well, no significant drainage DVT Evaluation: No evidence of DVT seen on physical exam. 2-3+ edema  Recent Labs    09/14/22 0844 09/15/22 0338  HGB 6.9* 9.9*  HCT 20.5* 29.3*   Results for orders placed or performed during the hospital encounter of 09/11/22 (from the past 24 hour(s))  CBC     Status: Abnormal   Collection Time: 09/14/22  8:44 AM  Result Value Ref Range   WBC 10.8 (H) 4.0 - 10.5 K/uL   RBC 2.53 (L) 3.87 - 5.11 MIL/uL   Hemoglobin 6.9 (LL) 12.0 - 15.0 g/dL   HCT 20.5 (L) 36.0 - 46.0 %   MCV 81.0 80.0 - 100.0 fL   MCH 27.3 26.0 - 34.0 pg   MCHC 33.7 30.0 - 36.0 g/dL   RDW 15.7 (H) 11.5 - 15.5 %   Platelets 148 (L) 150 - 400 K/uL   nRBC 0.4 (H) 0.0 - 0.2 %  Comprehensive metabolic panel     Status: Abnormal   Collection Time: 09/14/22  8:44 AM  Result Value Ref Range   Sodium 136 135 - 145 mmol/L   Potassium 4.0 3.5 - 5.1 mmol/L   Chloride 106 98 - 111 mmol/L   CO2 23 22 - 32 mmol/L    Glucose, Bld 99 70 - 99 mg/dL   BUN 14 6 - 20 mg/dL   Creatinine, Ser 0.62 0.44 - 1.00 mg/dL   Calcium 8.2 (L) 8.9 - 10.3 mg/dL   Total Protein 5.3 (L) 6.5 - 8.1 g/dL   Albumin 2.4 (L) 3.5 - 5.0 g/dL   AST 82 (H) 15 - 41 U/L   ALT 51 (H) 0 - 44 U/L   Alkaline Phosphatase 60 38 - 126 U/L   Total Bilirubin 0.5 0.3 - 1.2 mg/dL   GFR, Estimated >60 >60 mL/min   Anion gap 7 5 - 15  Prepare RBC (crossmatch)     Status: None   Collection Time: 09/14/22 12:42 PM  Result Value Ref Range   Order Confirmation      ORDER PROCESSED BY BLOOD BANK Performed at [facility], [address] [place] [address]   CBC     Status: Abnormal   Collection Time: 09/15/22  3:38 AM  Result Value Ref Range   WBC 10.2 4.0 - 10.5 K/uL   RBC 3.64 (L) 3.87 -  5.11 MIL/uL   Hemoglobin 9.9 (L) 12.0 - 15.0 g/dL   HCT 29.3 (L) 36.0 - 46.0 %   MCV 80.5 80.0 - 100.0 fL   MCH 27.2 26.0 - 34.0 pg   MCHC 33.8 30.0 - 36.0 g/dL   RDW 15.4 11.5 - 15.5 %   Platelets 148 (L) 150 - 400 K/uL   nRBC 1.4 (H) 0.0 - 0.2 %  Comprehensive metabolic panel     Status: Abnormal   Collection Time: 09/15/22  3:38 AM  Result Value Ref Range   Sodium 136 135 - 145 mmol/L   Potassium 3.8 3.5 - 5.1 mmol/L   Chloride 103 98 - 111 mmol/L   CO2 24 22 - 32 mmol/L   Glucose, Bld 86 70 - 99 mg/dL   BUN 11 6 - 20 mg/dL   Creatinine, Ser 0.59 0.44 - 1.00 mg/dL   Calcium 8.5 (L) 8.9 - 10.3 mg/dL   Total Protein 5.6 (L) 6.5 - 8.1 g/dL   Albumin 2.5 (L) 3.5 - 5.0 g/dL   AST 182 (H) 15 - 41 U/L   ALT 102 (H) 0 - 44 U/L   Alkaline Phosphatase 65 38 - 126 U/L   Total Bilirubin 1.1 0.3 - 1.2 mg/dL   GFR, Estimated >60 >60 mL/min   Anion gap 9 5 - 15     Assessment/Plan: Status post Cesarean section. Doing well postoperatively.  Continue current care. Appropriate rise in Hgb after 2 units LFT's are worse today On Keppra On metoprolol for cardiac Add Nifedipine One dose of IV lasix  Donnamae Jude, MD 09/15/2022, 8:04  AM

## 2022-09-15 NOTE — Lactation Note (Signed)
This note was copied from a baby's chart. Lactation Consultation Note  Patient Name: [name] [id number] Date: 09/15/2022 Age:36 days Reason for consult: Follow-up assessment;Late-preterm 34-36.6wks  LC in to room for follow up. Mother states she is going to formula feed exclusively. LC briefly discussed breast changes consistent with lactogenesis II and relief options. Mother and support person shows appreciation for information. Provided local resources for support.  Mother states she will call if in need of LC. Encouraged to contact [facility] for questions or concerns as needed.   Feeding Mother's Current Feeding Choice: Formula Nipple Type: Nfant Slow Flow (purple)  LATCH Score No latch observed during this encounter.   Lactation Tools Discussed/Used Tools: Pump;Flanges Breast pump type: Double-Electric Breast Pump;Manual Reason for Pumping: LPTI  Interventions Interventions: Skin to skin;Expressed milk;Education;Pace feeding;LC Services brochure;Hand pump;DEBP  Discharge Discharge Education: Engorgement and breast care;Warning signs for feeding baby Pump: Manual;DEBP WIC Program: Yes  Consult Status Consult Status: PRN Follow-up type: Call as needed    [place] 09/15/2022, 11:28 AM

## 2022-09-16 LAB — COMPREHENSIVE METABOLIC PANEL
ALT: 246 U/L — ABNORMAL HIGH (ref 0–44)
AST: 419 U/L — ABNORMAL HIGH (ref 15–41)
Albumin: 2.9 g/dL — ABNORMAL LOW (ref 3.5–5.0)
Alkaline Phosphatase: 88 U/L (ref 38–126)
Anion gap: 9 (ref 5–15)
BUN: 14 mg/dL (ref 6–20)
CO2: 28 mmol/L (ref 22–32)
Calcium: 9.7 mg/dL (ref 8.9–10.3)
Chloride: 99 mmol/L (ref 98–111)
Creatinine, Ser: 0.71 mg/dL (ref 0.44–1.00)
GFR, Estimated: >60 mL/min (ref >60)
Glucose, Bld: 95 mg/dL (ref 70–99)
Potassium: 3.9 mmol/L (ref 3.5–5.1)
Sodium: 136 mmol/L (ref 135–145)
Total Bilirubin: 1.6 mg/dL — ABNORMAL HIGH (ref 0.3–1.2)
Total Protein: 6.4 g/dL — ABNORMAL LOW (ref 6.5–8.1)

## 2022-09-16 LAB — CBC
HCT: 36.1 % (ref 36.0–46.0)
Hemoglobin: 12.2 g/dL (ref 12.0–15.0)
MCH: 27.3 pg (ref 26.0–34.0)
MCHC: 33.8 g/dL (ref 30.0–36.0)
MCV: 80.8 fL (ref 80.0–100.0)
Platelets: 223 10*3/uL (ref 150–400)
RBC: 4.47 MIL/uL (ref 3.87–5.11)
RDW: 15.8 % — ABNORMAL HIGH (ref 11.5–15.5)
WBC: 7.7 10*3/uL (ref 4.0–10.5)
nRBC: 0.8 % — ABNORMAL HIGH (ref 0.0–0.2)

## 2022-09-16 MED ORDER — FUROSEMIDE 10 MG/ML IJ SOLN
20.0000 mg | Freq: Two times a day (BID) | INTRAMUSCULAR | Status: AC
  Administered 2022-09-16 – 2022-09-17 (×3): 20 mg via INTRAVENOUS
  Filled 2022-09-16 (×3): qty 2

## 2022-09-16 MED ORDER — NIFEDIPINE ER OSMOTIC RELEASE 60 MG PO TB24
60.0000 mg | ORAL_TABLET | Freq: Two times a day (BID) | ORAL | Status: DC
  Administered 2022-09-16 – 2022-09-17 (×3): 60 mg via ORAL
  Filled 2022-09-16 (×3): qty 1

## 2022-09-16 MED ORDER — FUROSEMIDE 20 MG PO TABS: 20.0000 mg | ORAL_TABLET | Freq: Two times a day (BID) | ORAL | Status: DC

## 2022-09-16 NOTE — Lactation Note (Signed)
This note was copied from a baby's chart. Lactation Consultation Note  Patient Name: [name] [id number] Date: 09/16/2022 Age:36 days   Mom is exclusively formula feeding per choice.  Maternal Data    Feeding Mother's Current Feeding Choice: Formula Nipple Type: Nfant Slow Flow (purple)  LATCH Score                    Lactation Tools Discussed/Used    Interventions    Discharge    Consult Status Consult Status: Complete    Gordan Grell G 09/16/2022, 7:11 PM

## 2022-09-16 NOTE — Progress Notes (Signed)
Subjective: Postpartum Day 3: Cesarean Delivery Patient reports incisional pain and tolerating PO.    Objective: Vital signs in last 24 hours: Temp:  [97.9 F (36.6 C)-98.5 F (36.9 C)] 98.2 F (36.8 C) (03/31 0411) Pulse Rate:  [83-105] 83 (03/31 0411) Resp:  [17-18] 17 (03/31 0411) BP: (141-153)/(83-96) 141/96 (03/31 0411) SpO2:  [93 %-100 %] 100 % (03/31 0411)  Intake/Output Summary (Last 24 hours) at 09/16/2022 0758 Last data filed at 09/16/2022 0400 Gross per 24 hour  Intake 120 ml  Output 3550 ml  Net -3430 ml   Vitals:   09/15/22 1603 09/15/22 1925 09/15/22 2355 09/16/22 0411  BP: (!) 153/96 (!) 152/83 (!) 145/94 (!) 141/96  Pulse: 91 (!) 105 90 83  Resp: 18 17 18 17   Temp: 98.1 F (36.7 C) 98.5 F (36.9 C) 98 F (36.7 C) 98.2 F (36.8 C)  TempSrc: Oral Oral Oral Oral  SpO2: 97% 100% 100% 100%  Weight:      Height:        Physical Exam:  General: alert, cooperative, and appears stated age 37: appropriate Uterine Fundus: firm Incision: healing well DVT Evaluation: No evidence of DVT seen on physical exam.   Results for orders placed or performed during the hospital encounter of 09/11/22 (from the past 24 hour(s))  CBC     Status: Abnormal   Collection Time: 09/16/22 12:42 AM  Result Value Ref Range   WBC 7.7 4.0 - 10.5 K/uL   RBC 4.47 3.87 - 5.11 MIL/uL   Hemoglobin 12.2 12.0 - 15.0 g/dL   HCT 36.1 36.0 - 46.0 %   MCV 80.8 80.0 - 100.0 fL   MCH 27.3 26.0 - 34.0 pg   MCHC 33.8 30.0 - 36.0 g/dL   RDW 15.8 (H) 11.5 - 15.5 %   Platelets 223 150 - 400 K/uL   nRBC 0.8 (H) 0.0 - 0.2 %  Comprehensive metabolic panel     Status: Abnormal   Collection Time: 09/16/22 12:42 AM  Result Value Ref Range   Sodium 136 135 - 145 mmol/L   Potassium 3.9 3.5 - 5.1 mmol/L   Chloride 99 98 - 111 mmol/L   CO2 28 22 - 32 mmol/L   Glucose, Bld 95 70 - 99 mg/dL   BUN 14 6 - 20 mg/dL   Creatinine, Ser 0.71 0.44 - 1.00 mg/dL   Calcium 9.7 8.9 - 10.3 mg/dL   Total  Protein 6.4 (L) 6.5 - 8.1 g/dL   Albumin 2.9 (L) 3.5 - 5.0 g/dL   AST 419 (H) 15 - 41 U/L   ALT 246 (H) 0 - 44 U/L   Alkaline Phosphatase 88 38 - 126 U/L   Total Bilirubin 1.6 (H) 0.3 - 1.2 mg/dL   GFR, Estimated >60 >60 mL/min   Anion gap 9 5 - 15     Assessment/Plan: Status post Cesarean section. Postoperative course complicated by abnormal LFTs , poorly controlled BP, LE edema, CHF, ABL anemia Stable Hgb Worsening LFT's - suspect shock liver, repeat tomorrow vs. Pre-eclampsia off magnesium and on Keppra Increased Nifedipine to 60 mg bid Consider addition of ACE-I Consider increase in B-blocker if ok with cards   Donnamae Jude, MD 09/16/2022, 7:56 AM

## 2022-09-17 ENCOUNTER — Ambulatory Visit: Payer: Medicaid Other

## 2022-09-17 ENCOUNTER — Other Ambulatory Visit (HOSPITAL_COMMUNITY)
Admission: RE | Admit: 2022-09-17 | Discharge: 2022-09-17 | Disposition: A | Discharge status: Home / Self Care | Payer: Medicaid Other | Source: Ambulatory Visit

## 2022-09-17 ENCOUNTER — Other Ambulatory Visit (HOSPITAL_COMMUNITY): Payer: Self-pay

## 2022-09-17 DIAGNOSIS — Z8619 Personal history of other infectious and parasitic diseases: Secondary | ICD-10-CM

## 2022-09-17 HISTORY — DX: Personal history of other infectious and parasitic diseases: Z86.19

## 2022-09-17 LAB — CULTURE, BLOOD (ROUTINE X 2)
Culture: NO GROWTH
Culture: NO GROWTH
Special Requests: ADEQUATE
Special Requests: ADEQUATE

## 2022-09-17 LAB — COMPREHENSIVE METABOLIC PANEL
ALT: 182 U/L — ABNORMAL HIGH (ref 0–44)
AST: 135 U/L — ABNORMAL HIGH (ref 15–41)
Albumin: 3 g/dL — ABNORMAL LOW (ref 3.5–5.0)
Alkaline Phosphatase: 76 U/L (ref 38–126)
Anion gap: 11 (ref 5–15)
BUN: 11 mg/dL (ref 6–20)
CO2: 27 mmol/L (ref 22–32)
Calcium: 9 mg/dL (ref 8.9–10.3)
Chloride: 98 mmol/L (ref 98–111)
Creatinine, Ser: 0.67 mg/dL (ref 0.44–1.00)
GFR, Estimated: >60 mL/min (ref >60)
Glucose, Bld: 107 mg/dL — ABNORMAL HIGH (ref 70–99)
Potassium: 3.6 mmol/L (ref 3.5–5.1)
Sodium: 136 mmol/L (ref 135–145)
Total Bilirubin: 1.8 mg/dL — ABNORMAL HIGH (ref 0.3–1.2)
Total Protein: 6.3 g/dL — ABNORMAL LOW (ref 6.5–8.1)

## 2022-09-17 MED ORDER — METOPROLOL SUCCINATE ER 25 MG PO TB24
25.0000 mg | ORAL_TABLET | Freq: Every day | ORAL | 3 refills | Status: DC
  Filled 2022-09-17 (×2): qty 30, 30d supply, fill #0

## 2022-09-17 MED ORDER — FUROSEMIDE 20 MG PO TABS
20.0000 mg | ORAL_TABLET | Freq: Every day | ORAL | 3 refills | Status: DC
  Filled 2022-09-17: qty 30, 30d supply, fill #0

## 2022-09-17 MED ORDER — NIFEDIPINE ER 60 MG PO TB24
60.0000 mg | ORAL_TABLET | Freq: Two times a day (BID) | ORAL | 3 refills | Status: DC
  Filled 2022-09-17: qty 60, 30d supply, fill #0

## 2022-09-17 MED ORDER — OXYCODONE HCL 5 MG PO TABS
5.0000 mg | ORAL_TABLET | ORAL | 0 refills | Status: DC | PRN
  Filled 2022-09-17: qty 20, 4d supply, fill #0

## 2022-09-17 MED ORDER — IBUPROFEN 600 MG PO TABS
600.0000 mg | ORAL_TABLET | Freq: Four times a day (QID) | ORAL | 0 refills | Status: DC | PRN
  Filled 2022-09-17: qty 30, 8d supply, fill #0

## 2022-09-18 ENCOUNTER — Inpatient Hospital Stay (HOSPITAL_COMMUNITY): Admission: RE | Admit: 2022-09-18 | Payer: Medicaid Other | Source: Home / Self Care | Admitting: Family Medicine

## 2022-09-24 ENCOUNTER — Encounter: Payer: Self-pay | Admitting: Obstetrics & Gynecology

## 2022-09-24 ENCOUNTER — Telehealth (HOSPITAL_COMMUNITY): Payer: Self-pay | Admitting: *Deleted

## 2022-09-24 ENCOUNTER — Ambulatory Visit (INDEPENDENT_AMBULATORY_CARE_PROVIDER_SITE_OTHER): Payer: Medicaid Other | Admitting: Obstetrics & Gynecology

## 2022-09-24 VITALS — BP 108/73 | HR 108 | Ht 64.0 in | Wt 134.0 lb

## 2022-09-24 DIAGNOSIS — Z4889 Encounter for other specified surgical aftercare: Secondary | ICD-10-CM

## 2022-09-24 DIAGNOSIS — O1415 Severe pre-eclampsia, complicating the puerperium: Secondary | ICD-10-CM

## 2022-09-24 DIAGNOSIS — O34219 Maternal care for unspecified type scar from previous cesarean delivery: Secondary | ICD-10-CM

## 2022-09-24 DIAGNOSIS — O1410 Severe pre-eclampsia, unspecified trimester: Secondary | ICD-10-CM

## 2022-09-24 NOTE — Progress Notes (Signed)
    PostOp Visit Note  [name] is a 36 y.o. [phone] female who presents for a postoperative visit. She is 1 week postop following a repeat LTCS with Dr. Kennieth Rad completed on 3/27   Today she notes that she is doing ok- pain well controlled.  Some sensitivity mid-abdomen where bruise is present.  Appetite is slowly returning. Denies fever or chills.  Tolerating gen diet.  +Flatus, Regular BMs.   Overall doing well and reports no acute complaints   Review of Systems Pertinent items are noted in HPI.    Objective:  BP 108/73   Pulse (!) 108   Ht 5\' 4"  (1.626 m)   Wt 134 lb (60.8 kg)   LMP 01/02/2022   BMI 23.00 kg/m    Physical Examination:  GENERAL ASSESSMENT: well developed and well nourished SKIN: normal color, no lesions CHEST: normal air exchange, respiratory effort normal with no retractions HEART: regular rate and rhythm ABDOMEN: soft, ecchymosis noted below umbilicus- appropriately tender INCISION: C/D/I- healing appropriately EXTREMITY: no edema, no calf tenderness bialterally PSYCH: mood appropriate, normal affect       Assessment:    Postop incision check  Preeclampsia with severe features Mitral regurg with systolic heart failure  Plan:   -postoperatively doing well -reviewed postop care -still not sure about contraceptive plan POPs vs IUD -ok to discontinue Lasix -continue BP meds, pt has appointment with cardiology this week- follow their recommendations.  Suspect she may decrease to just Toprol XL -f/u in 4-5wks for final postpartum visit  Myna Hidalgo, DO Attending Obstetrician & Gynecologist, [facility] [employer], [facility]

## 2022-09-24 NOTE — Progress Notes (Deleted)
Cardio-Obstetrics Clinic  Follow Up Note   Date:  09/24/2022   ID:  Emylia Piquette, DOB 11/15/1985, MRN [id number]  PCP:  Patient, No Pcp Per   [facility] Providers Cardiologist:  [name], MD  Electrophysiologist:  None       Referring MD: No ref. provider found   Chief Complaint: Moderate to severe MR  History of Present Illness:    Vivica Blayney is a 37 y.o. female [[id number]] who returns for follow up of moderate to severe MR.  Patient was hospitalized in 04/2022 for pyelonephritis.  On admission, she was noted to by tachycardic and ECG was performed which revealed sinus tachycardia with borderline LVH and inferolateral TWI. Cardiology was called overnight and TTE was recommended which revealed  LVEF 40% with apical lateral hypokinesis, normal RV, mildly thickened and calcified mitral valve leaflet (abnormal for age) but no definitive post-inflammatory changes with moderate to severe MR. Trops negative x2. BNP 109. She appeared euvolemic on exam with no chest pain or SOB. We started metoprolol 12.5mg  XL daily.  Notably, had pre-eclampsia with prior pregnancies at time of delivery. Resolved with delivery and did not require BP meds post-pregnancy.  Was seen in clinic on 05/07/22 where she was doing well from a CV standpoint. No HF symptoms. TTE 05/22/22 stable with LVEF 40-45%, normal RV, moderate to severe MR.   Cardiac monitor 07/2022 with NSR, rare ectopy, 1 six beat run of NSVT. She has been continued on metoprolol.  TTE 07/2022 with stable EF 45-50%, moderate MR, normal RV  Was last seen in clinic on 08/24/22. Was stable from a CV standpoint. Repeat TTE 08/31/22 with interval decrease in EF to 37% by 3D, moderate to severe MR. Given concern that she was developing worsening systolic HF, discussion was held with OB/MFM/anesthesia. Goal is to get to 37w. Repeat TTE 09/10/2022 stable with EF 35-40%, moderate MR.  Admitted 09/11/22 with severe pre-eclampsia  and active labor. Underwent c-section which she tolerated well. Repeat TTE 09/12/22 with LVEF 30-35%, normal RV, mild MR, no other significant valve disease.   Today, ***   Prior CV Studies Reviewed: The following studies were reviewed today: TTE 2022-09-10: IMPRESSIONS     1. Left ventricular ejection fraction, by estimation, is 35 to 40%. The  left ventricle has moderately decreased function. The left ventricle  demonstrates global hypokinesis. Left ventricular diastolic parameters are  consistent with Grade I diastolic  dysfunction (impaired relaxation). The average left ventricular global  longitudinal strain is -12.0 %. The global longitudinal strain is  abnormal.   2. Right ventricular systolic function is normal. The right ventricular  size is normal. There is normal pulmonary artery systolic pressure.   3. The mitral valve is normal in structure. Moderate mitral valve  regurgitation. No evidence of mitral stenosis.   4. The aortic valve is normal in structure. Aortic valve regurgitation is  not visualized. No aortic stenosis is present.   5. The inferior vena cava is normal in size with greater than 50%  respiratory variability, suggesting right atrial pressure of 3 mmHg.   Comparison(s): A prior study was performed on 08/31/22. No significant  change from prior study.   TTE 08/31/22: IMPRESSIONS     1. Left ventricular ejection fraction, by estimation, is 35 to 40%. The  left ventricle has moderately decreased function. The left ventricle  demonstrates global hypokinesis. The left ventricular internal cavity size  was mildly to moderately dilated.  There is mild left ventricular hypertrophy. Left  ventricular diastolic  parameters were normal. The average left ventricular global longitudinal  strain is -14.0 %. The global longitudinal strain is abnormal.   2. Right ventricular systolic function is normal. The right ventricular  size is normal. Tricuspid regurgitation  signal is inadequate for assessing  PA pressure.   3. Left atrial size was mildly dilated.   4. Moderate to severe mitral valve regurgitation.   5. The aortic valve is normal in structure. Aortic valve regurgitation is  not visualized.   6. The inferior vena cava is normal in size with greater than 50%  respiratory variability, suggesting right atrial pressure of 3 mmHg.   Conclusion(s)/Recommendation(s): Compared to prior echo 07/20/2022, EF has  mildly declined.  TTE 07/2022: IMPRESSIONS     1. Mild global reduction in LV function; moderate MR (may be  underestimated; 2 jets noted; suggest TEE to further assess).   2. Left ventricular ejection fraction, by estimation, is 45 to 50%. The  left ventricle has mildly decreased function. The left ventricle  demonstrates global hypokinesis. Left ventricular diastolic parameters  were normal. The average left ventricular  global longitudinal strain is -19.9 %. The global longitudinal strain is  normal.   3. Right ventricular systolic function is normal. The right ventricular  size is normal.   4. The mitral valve is normal in structure. Moderate mitral valve  regurgitation. No evidence of mitral stenosis.   5. The aortic valve is tricuspid. Aortic valve regurgitation is not  visualized. No aortic stenosis is present.   6. The inferior vena cava is normal in size with greater than 50%  respiratory variability, suggesting right atrial pressure of 3 mmHg.   Cardiac Monitor 07/2022:   Patch wear time was 2 days and 18 hours   Predominant rhythm is NSR with HR 86   There was 1 run of nonsustained VT lasting 6 beats   Rare ectopy (<1% SVE and VE)   Patient symptoms correlated with NSR   No sustained arrhythmias or significant pauses     Patch Wear Time:  2 days and 18 hours (2024-02-05T23:45:56-0500 to 2024-02-08T18:00:45-0500)   Patient had a min HR of 58 bpm, max HR of 179 bpm, and avg HR of 86 bpm. Predominant underlying rhythm was  Sinus Rhythm. 1 run of Ventricular Tachycardia occurred lasting 6 beats with a max rate of 179 bpm (avg 167 bpm). Isolated SVEs were rare (<1.0%),  SVE Couplets were rare (<1.0%), and no SVE Triplets were present. Isolated VEs were rare (<1.0%), VE Couplets were rare (<1.0%), and no VE Triplets were present.  TTE 05/22/22: IMPRESSIONS     1. Left ventricular ejection fraction, by estimation, is 40 to 45%. Left  ventricular ejection fraction by 3D volume is 45 %. The left ventricle has  mildly decreased function. The left ventricle demonstrates global  hypokinesis. The left ventricular  internal cavity size was mildly dilated. Left ventricular diastolic  parameters were normal.   2. Right ventricular systolic function is normal. The right ventricular  size is normal. Tricuspid regurgitation signal is inadequate for assessing  PA pressure.   3. The mitral valve is normal in structure. Moderate to severe mitral  valve regurgitation.   4. The aortic valve is tricuspid. Aortic valve regurgitation is not  visualized. No aortic stenosis is present.   5. The inferior vena cava is normal in size with greater than 50%  respiratory variability, suggesting right atrial pressure of 3 mmHg.   Comparison(s): No significant change from prior  study. Prior images  reviewed side by side.   Conclusion(s)/Recommendation(s): Consider TEE to clarify mechanism and  severity of mitral insufficiency.    TTE 04/22/22: IMPRESSIONS     1. Left ventricular ejection fraction, by estimation, is 40%. Left  ventricular ejection fraction by 3D volume is 40 %. The left ventricle has  mild to moderately decreased function. The left ventricle demonstrates  global hypokinesis and regional wall  motion abnormalities (see scoring diagram/findings for description). The  left ventricular internal cavity size was mildly dilated. Left ventricular  diastolic parameters were normal.   2. Right ventricular systolic function  is normal. The right ventricular  size is normal. Tricuspid regurgitation signal is inadequate for assessing  PA pressure.   3. The mitral valve is grossly normal. Moderate to severe mitral valve  regurgitation. The mitral valve is mildly thickened for age, but no  definite post inflammatory change unless clinical relevance. Suspect  mechanism is secondary MR due to LV  dysfunction. No evidence of mitral stenosis.   4. The aortic valve was not well visualized. Aortic valve regurgitation  is not visualized. No aortic stenosis is present.   5. The inferior vena cava is normal in size with greater than 50%  respiratory variability, suggesting right atrial pressure of 3 mmHg.     Past Medical History:  Diagnosis Date   Bacterial infection    History of chicken pox    Systolic heart failure     Past Surgical History:  Procedure Laterality Date   CESAREAN SECTION     CESAREAN SECTION N/A 02/02/2015   Procedure: CESAREAN SECTION;  Surgeon: Geryl RankinsEvelyn Varnado, MD;  Location: [facility];  Service: Obstetrics;  Laterality: N/A;   CESAREAN SECTION N/A 09/11/2022   Procedure: CESAREAN SECTION;  Surgeon: Lazaro ArmsEure, Luther H, MD;  Location: [facility];  Service: Obstetrics;  Laterality: N/A;   DILATION AND CURETTAGE OF UTERUS     WISDOM TOOTH EXTRACTION        OB History     Gravida  5   Para  3   Term  1   Preterm  2   AB  2   Living  3      SAB  1   IAB  1   Ectopic      Multiple  0   Live Births  3               Current Medications: No outpatient medications have been marked as taking for the 10/02/22 encounter (Appointment) with Meriam SpraguePemberton, Karlissa Aron E, MD.     Allergies:   Other and Penicillins   Social History   Socioeconomic History   Marital status: Single    Spouse name: Not on file   Number of children: Not on file   Years of education: Not on file   Highest education level: Not on file  Occupational History   Not on file  Tobacco Use   Smoking status: Never    Smokeless tobacco: Never  Vaping Use   Vaping Use: Never used  Substance and Sexual Activity   Alcohol use: No   Drug use: No   Sexual activity: Not Currently    Birth control/protection: None  Other Topics Concern   Not on file  Social History Narrative   Not on file   Social Determinants of Health   Financial Resource Strain: Not on file  Food Insecurity: No Food Insecurity (09/14/2022)   Hunger Vital Sign    Worried About  Running Out of Food in the Last Year: Never true    Ran Out of Food in the Last Year: Never true  Transportation Needs: No Transportation Needs (09/14/2022)   PRAPARE - Administrator, Civil Service (Medical): No    Lack of Transportation (Non-Medical): No  Physical Activity: Not on file  Stress: Not on file  Social Connections: Not on file      Family History  Problem Relation Age of Onset   Hypertension Mother    Hypertension Father    Diabetes Father    Hypertension Paternal Aunt    Asthma Neg Hx    Cancer Neg Hx    Heart disease Neg Hx    Stroke Neg Hx       ROS:   Please see the history of present illness.    Review of Systems  Constitutional:  Negative for chills and fever.  HENT:  Negative for nosebleeds and tinnitus.   Eyes:  Negative for blurred vision.  Respiratory:  Positive for shortness of breath.   Cardiovascular:  Positive for palpitations. Negative for chest pain, orthopnea, claudication, leg swelling and PND.  Gastrointestinal:  Negative for blood in stool, melena, nausea and vomiting.  Genitourinary:  Negative for dysuria and hematuria.  Musculoskeletal:  Negative for falls.  Neurological:  Negative for dizziness, loss of consciousness and headaches.    All other systems reviewed and are negative.   Labs/EKG Reviewed:    EKG:  No new tracing   Recent Labs: 07/16/2022: Magnesium 1.8 09/04/2022: B Natriuretic Peptide 87.5 09/16/2022: Hemoglobin 12.2; Platelets 223 09/17/2022: ALT 182; BUN 11; Creatinine,  Ser 0.67; Potassium 3.6; Sodium 136   Recent Lipid Panel No results found for: "CHOL", "TRIG", "HDL", "CHOLHDL", "LDLCALC", "LDLDIRECT"  Physical Exam:    VS:  LMP 01/02/2022     Wt Readings from Last 3 Encounters:  09/24/22 134 lb (60.8 kg)  09/05/22 149 lb (67.6 kg)  09/12/22 145 lb 15.1 oz (66.2 kg)     GEN:  Comfortable, NAD HEENT: Normal NECK: No JVD; No carotid bruits CARDIAC: RRR, 3/6 systolic murmur heard throughout the precordium. No rubs or gallopse RESPIRATORY:  Clear bilaterally ABDOMEN: Gravid, nontender to palpation MUSCULOSKELETAL:  No edema, wamr SKIN: Warm and dry NEUROLOGIC:  Alert and oriented x 3 PSYCHIATRIC:  Normal affect    Risk Assessment/Risk Calculators:              ASSESSMENT & PLAN:    #Chronic Systolic HF: Patient admitted in 04/2022 for pyelonephritis with ECG revealing sinus tachycardia, possible LVH and inferior TWI. This abnormal ECG prompted TTE which revealed LVEF 40% with apical lateral hypokinesis, moderate-to-severe MR with mildly thickened valve for age. Had been stable throughout pregnancy, however, repeat TTE in 08/2022 with drop in EF to 35-40%, moderate to severe MR. Looked slightly worse at 30-35% at time of delivery. Fortunately, she is currently compensated and euvolemic. Will plan for CMR now that she is no longer pregnant. -Check CMR -Continue metoprolol 12.5mg  XL daily    #Moderate-to-severe MR: MV appears thickened with restricted leaflet motion (? Post-inflammatory vs rheumatic). Denies any history of rheumatic fever, IVDU, blood stream infections, severe childhood illness or family history of disease. Likely this has been chronic and ongoing and patient is compensated and relatively asymptomatic.  Will check CMR now that she is no longer pregnant. -Continue metop 12.5mg  XL daily  -Check CMR  #Palpitations: Improved. Zio monitor with rare ectopy, 1 six beat run of NSVT. -Continue  metop 12.5mg  XL    #Dizziness: Improved. BP stable. Will monitor  #History of Pre-Eclampsia: #High Risk Pregnancy: -Continue ASA  daily -BP controlled -Management per OBGYN -Followed by Cardio OB team   There are no Patient Instructions on file for this visit.    Medication Adjustments/Labs and Tests Ordered: Current medicines are reviewed at length with the patient today.  Concerns regarding medicines are outlined above.  Tests Ordered: No orders of the defined types were placed in this encounter.  Medication Changes: No orders of the defined types were placed in this encounter.    Laurance Flatten, MD

## 2022-09-24 NOTE — Telephone Encounter (Signed)
Left phone voicemail message.  Duffy Rhody, RN 09-24-2022 at 2:28pm

## 2022-09-26 ENCOUNTER — Inpatient Hospital Stay (HOSPITAL_COMMUNITY): Payer: Medicaid Other

## 2022-09-26 ENCOUNTER — Other Ambulatory Visit: Payer: Self-pay

## 2022-09-26 ENCOUNTER — Encounter: Payer: Self-pay | Admitting: Obstetrics & Gynecology

## 2022-09-26 ENCOUNTER — Encounter (HOSPITAL_COMMUNITY): Payer: Self-pay | Admitting: Family Medicine

## 2022-09-26 ENCOUNTER — Inpatient Hospital Stay (HOSPITAL_COMMUNITY)
Admission: AD | Admit: 2022-09-26 | Discharge: 2022-10-02 | DRG: 776 | Disposition: A | Discharge status: Home / Self Care | Payer: Medicaid Other | Attending: Obstetrics and Gynecology | Admitting: Obstetrics and Gynecology

## 2022-09-26 DIAGNOSIS — O85 Puerperal sepsis: Secondary | ICD-10-CM | POA: Diagnosis present

## 2022-09-26 DIAGNOSIS — K651 Peritoneal abscess: Principal | ICD-10-CM | POA: Diagnosis present

## 2022-09-26 DIAGNOSIS — O8603 Infection of obstetric surgical wound, organ and space site: Principal | ICD-10-CM | POA: Diagnosis present

## 2022-09-26 DIAGNOSIS — A419 Sepsis, unspecified organism: Secondary | ICD-10-CM

## 2022-09-26 DIAGNOSIS — O1093 Unspecified pre-existing hypertension complicating the puerperium: Secondary | ICD-10-CM | POA: Diagnosis present

## 2022-09-26 DIAGNOSIS — I97131 Postprocedural heart failure following other surgery: Secondary | ICD-10-CM | POA: Diagnosis present

## 2022-09-26 DIAGNOSIS — Z20822 Contact with and (suspected) exposure to covid-19: Secondary | ICD-10-CM | POA: Diagnosis present

## 2022-09-26 DIAGNOSIS — I5022 Chronic systolic (congestive) heart failure: Secondary | ICD-10-CM | POA: Diagnosis present

## 2022-09-26 HISTORY — DX: Peritoneal abscess: K65.1

## 2022-09-26 LAB — RESP PANEL BY RT-PCR (FLU A&B, COVID) ARPGX2
Influenza A by PCR: NEGATIVE
Influenza B by PCR: NEGATIVE
SARS Coronavirus 2 by RT PCR: NEGATIVE

## 2022-09-26 LAB — CBC WITH DIFFERENTIAL/PLATELET
Abs Immature Granulocytes: 0.07 10*3/uL (ref 0.00–0.07)
Basophils Absolute: 0 10*3/uL (ref 0.0–0.1)
Basophils Relative: 0 %
Eosinophils Absolute: 0 10*3/uL (ref 0.0–0.5)
Eosinophils Relative: 0 %
HCT: 47.4 % — ABNORMAL HIGH (ref 36.0–46.0)
Hemoglobin: 15.3 g/dL — ABNORMAL HIGH (ref 12.0–15.0)
Immature Granulocytes: 1 %
Lymphocytes Relative: 7 %
Lymphs Abs: 1 10*3/uL (ref 0.7–4.0)
MCH: 26.6 pg (ref 26.0–34.0)
MCHC: 32.3 g/dL (ref 30.0–36.0)
MCV: 82.4 fL (ref 80.0–100.0)
Monocytes Absolute: 0.5 10*3/uL (ref 0.1–1.0)
Monocytes Relative: 4 %
Neutro Abs: 11.6 10*3/uL — ABNORMAL HIGH (ref 1.7–7.7)
Neutrophils Relative %: 88 %
Platelets: 651 10*3/uL — ABNORMAL HIGH (ref 150–400)
RBC: 5.75 MIL/uL — ABNORMAL HIGH (ref 3.87–5.11)
RDW: 15.9 % — ABNORMAL HIGH (ref 11.5–15.5)
WBC: 13.2 10*3/uL — ABNORMAL HIGH (ref 4.0–10.5)
nRBC: 0 % (ref 0.0–0.2)

## 2022-09-26 LAB — COMPREHENSIVE METABOLIC PANEL
ALT: 29 U/L (ref 0–44)
AST: 22 U/L (ref 15–41)
Albumin: 3.3 g/dL — ABNORMAL LOW (ref 3.5–5.0)
Alkaline Phosphatase: 83 U/L (ref 38–126)
Anion gap: 12 (ref 5–15)
BUN: 11 mg/dL (ref 6–20)
CO2: 20 mmol/L — ABNORMAL LOW (ref 22–32)
Calcium: 8.7 mg/dL — ABNORMAL LOW (ref 8.9–10.3)
Chloride: 99 mmol/L (ref 98–111)
Creatinine, Ser: 0.82 mg/dL (ref 0.44–1.00)
GFR, Estimated: >60 mL/min (ref >60)
Glucose, Bld: 118 mg/dL — ABNORMAL HIGH (ref 70–99)
Potassium: 4.1 mmol/L (ref 3.5–5.1)
Sodium: 131 mmol/L — ABNORMAL LOW (ref 135–145)
Total Bilirubin: 1.7 mg/dL — ABNORMAL HIGH (ref 0.3–1.2)
Total Protein: 7.4 g/dL (ref 6.5–8.1)

## 2022-09-26 LAB — URINALYSIS, ROUTINE W REFLEX MICROSCOPIC
Bacteria, UA: NONE SEEN
Bilirubin Urine: NEGATIVE
Glucose, UA: NEGATIVE mg/dL
Ketones, ur: NEGATIVE mg/dL
Nitrite: NEGATIVE
Protein, ur: 30 mg/dL — AB
Specific Gravity, Urine: 1.02 (ref 1.005–1.030)
pH: 5 (ref 5.0–8.0)

## 2022-09-26 LAB — TYPE AND SCREEN
ABO/RH(D): B POS
Antibody Screen: NEGATIVE

## 2022-09-26 LAB — LACTIC ACID, PLASMA
Lactic Acid, Venous: 0.9 mmol/L (ref 0.5–1.9)
Lactic Acid, Venous: 0.8 mmol/L (ref 0.5–1.9)

## 2022-09-26 MED ORDER — ACETAMINOPHEN 325 MG PO TABS
650.0000 mg | ORAL_TABLET | ORAL | Status: DC | PRN
  Administered 2022-09-27 – 2022-10-02 (×14): 650 mg via ORAL
  Filled 2022-09-26 (×14): qty 2

## 2022-09-26 MED ORDER — IOHEXOL 350 MG/ML SOLN
75.0000 mL | Freq: Once | INTRAVENOUS | Status: AC | PRN
  Administered 2022-09-26: 75 mL via INTRAVENOUS

## 2022-09-26 MED ORDER — METRONIDAZOLE 500 MG/100ML IV SOLN
500.0000 mg | Freq: Three times a day (TID) | INTRAVENOUS | Status: AC
  Administered 2022-09-26 – 2022-10-01 (×13): 500 mg via INTRAVENOUS
  Filled 2022-09-26 (×13): qty 100

## 2022-09-26 MED ORDER — METOPROLOL SUCCINATE ER 25 MG PO TB24
25.0000 mg | ORAL_TABLET | Freq: Every day | ORAL | Status: DC
  Administered 2022-09-27 – 2022-10-02 (×6): 25 mg via ORAL
  Filled 2022-09-26 (×8): qty 1

## 2022-09-26 MED ORDER — LACTATED RINGERS IV SOLN: 125.0000 mL/h | INTRAVENOUS | Status: DC

## 2022-09-26 MED ORDER — METRONIDAZOLE 500 MG/100ML IV SOLN
500.0000 mg | Freq: Two times a day (BID) | INTRAVENOUS | Status: DC
  Administered 2022-09-26: 500 mg via INTRAVENOUS
  Filled 2022-09-26 (×2): qty 100

## 2022-09-26 MED ORDER — LACTATED RINGERS IV SOLN: INTRAVENOUS | Status: DC

## 2022-09-26 MED ORDER — DOCUSATE SODIUM 100 MG PO CAPS
100.0000 mg | ORAL_CAPSULE | Freq: Every day | ORAL | Status: DC
  Administered 2022-09-27 – 2022-10-02 (×6): 100 mg via ORAL
  Filled 2022-09-26 (×6): qty 1

## 2022-09-26 MED ORDER — HYDROMORPHONE HCL 1 MG/ML IJ SOLN
1.0000 mg | INTRAMUSCULAR | Status: DC | PRN
  Administered 2022-09-26 – 2022-09-27 (×9): 1 mg via INTRAVENOUS
  Filled 2022-09-26 (×9): qty 1

## 2022-09-26 MED ORDER — LEVOFLOXACIN IN D5W 750 MG/150ML IV SOLN: 750.0000 mg | Freq: Once | INTRAVENOUS | Status: DC

## 2022-09-26 MED ORDER — LACTATED RINGERS IV BOLUS (SEPSIS)
1000.0000 mL | Freq: Once | INTRAVENOUS | Status: AC
  Administered 2022-09-26: 1000 mL via INTRAVENOUS

## 2022-09-26 MED ORDER — LACTATED RINGERS IV SOLN: 150.0000 mL/h | INTRAVENOUS | Status: DC

## 2022-09-26 MED ORDER — NIFEDIPINE ER OSMOTIC RELEASE 60 MG PO TB24
60.0000 mg | ORAL_TABLET | Freq: Two times a day (BID) | ORAL | Status: DC
  Administered 2022-09-26 – 2022-09-27 (×2): 60 mg via ORAL
  Filled 2022-09-26 (×3): qty 1

## 2022-09-26 MED ORDER — IBUPROFEN 600 MG PO TABS
600.0000 mg | ORAL_TABLET | Freq: Once | ORAL | Status: AC
  Administered 2022-09-26: 600 mg via ORAL
  Filled 2022-09-26: qty 1

## 2022-09-26 MED ORDER — PRENATAL MULTIVITAMIN CH
1.0000 | ORAL_TABLET | Freq: Every day | ORAL | Status: DC
  Administered 2022-09-27 – 2022-09-29 (×3): 1 via ORAL
  Filled 2022-09-26 (×4): qty 1

## 2022-09-26 MED ORDER — CALCIUM CARBONATE ANTACID 500 MG PO CHEW
2.0000 | CHEWABLE_TABLET | ORAL | Status: DC | PRN
  Administered 2022-09-27 – 2022-09-28 (×2): 400 mg via ORAL
  Filled 2022-09-26 (×2): qty 2

## 2022-09-26 MED ORDER — SODIUM CHLORIDE 0.9 % IV SOLN
2.0000 g | Freq: Three times a day (TID) | INTRAVENOUS | Status: DC
  Administered 2022-09-26 – 2022-09-30 (×14): 2 g via INTRAVENOUS
  Filled 2022-09-26 (×16): qty 12.5

## 2022-09-26 NOTE — MAU Provider Note (Signed)
History     [id number]  Arrival date and time: 09/26/22 1106    No chief complaint on file.    HPI Indi Verran is a 37 y.o. s/p RCS x3 on 3/27 c/b hemorrhagic shock requiring pRBC transfusion, HFrEF managed in the ICU, and poorly controlled HTN, who presents with fever and incisional pain.   Reports she has been having R sided abdominal pain since last night that has been progressively worsening Has been taking all meds as prescribed, no unusual events yesterday Endorses a mild cough but no runny nose No chest pain or shortness of breath No burning or pain with urination No diarrhea Has been passing gas and having bowel movements   --/--/B POS (04/10 1256)  OB History     Gravida  5   Para  3   Term  1   Preterm  2   AB  2   Living  3      SAB  1   IAB  1   Ectopic      Multiple  0   Live Births  3           Past Medical History:  Diagnosis Date   Bacterial infection    History of chicken pox    Systolic heart failure     Past Surgical History:  Procedure Laterality Date   CESAREAN SECTION     CESAREAN SECTION N/A 02/02/2015   Procedure: CESAREAN SECTION;  Surgeon: Geryl Rankins, MD;  Location: [facility];  Service: Obstetrics;  Laterality: N/A;   CESAREAN SECTION N/A 09/11/2022   Procedure: CESAREAN SECTION;  Surgeon: Lazaro Arms, MD;  Location: [facility];  Service: Obstetrics;  Laterality: N/A;   DILATION AND CURETTAGE OF UTERUS     WISDOM TOOTH EXTRACTION      Family History  Problem Relation Age of Onset   Hypertension Mother    Hypertension Father    Diabetes Father    Hypertension Paternal Aunt    Asthma Neg Hx    Cancer Neg Hx    Heart disease Neg Hx    Stroke Neg Hx     Social History   Socioeconomic History   Marital status: Single    Spouse name: Not on file   Number of children: Not on file   Years of education: Not on file   Highest education level: Not on file  Occupational History   Not on file  Tobacco  Use   Smoking status: Never   Smokeless tobacco: Never  Vaping Use   Vaping Use: Never used  Substance and Sexual Activity   Alcohol use: No   Drug use: No   Sexual activity: Not Currently    Birth control/protection: None  Other Topics Concern   Not on file  Social History Narrative   Not on file   Social Determinants of Health   Financial Resource Strain: Not on file  Food Insecurity: No Food Insecurity (09/14/2022)   Hunger Vital Sign    Worried About Running Out of Food in the Last Year: Never true    Ran Out of Food in the Last Year: Never true  Transportation Needs: No Transportation Needs (09/14/2022)   PRAPARE - Administrator, Civil Service (Medical): No    Lack of Transportation (Non-Medical): No  Physical Activity: Not on file  Stress: Not on file  Social Connections: Not on file  Intimate Partner Violence: Not At Risk (09/14/2022)   Humiliation, Afraid,  Rape, and Kick questionnaire    Fear of Current or Ex-Partner: No    Emotionally Abused: No    Physically Abused: No    Sexually Abused: No    Allergies  Allergen Reactions   Other Other (See Comments)   Penicillins Rash    No current facility-administered medications on file prior to encounter.   Current Outpatient Medications on File Prior to Encounter  Medication Sig Dispense Refill   metoprolol succinate (TOPROL XL) 25 MG 24 hr tablet Take 1 tablet (25 mg total) by mouth daily. 30 tablet 3   NIFEdipine (ADALAT CC) 60 MG 24 hr tablet Take 1 tablet (60 mg total) by mouth 2 (two) times daily. 60 tablet 3   oxyCODONE (OXY IR/ROXICODONE) 5 MG immediate release tablet Take 1 tablet (5 mg total) by mouth every 4 (four) hours as needed for moderate pain. 20 tablet 0   aspirin (ASPIRIN CHILDRENS) 81 MG chewable tablet Chew 1 tablet (81 mg total) by mouth daily. (Patient not taking: Reported on 09/24/2022) 90 tablet 1   ferrous sulfate 325 (65 FE) MG EC tablet Take 1 tablet (325 mg total) by mouth every  other day. (Patient not taking: Reported on 09/05/2022) 60 tablet 1   furosemide (LASIX) 20 MG tablet Take 1 tablet (20 mg total) by mouth daily. 30 tablet 3   ibuprofen (ADVIL) 600 MG tablet Take 1 tablet (600 mg total) by mouth every 6 (six) hours as needed for moderate pain, cramping or fever. (Patient not taking: Reported on 09/24/2022) 30 tablet 0   Prenatal MV & Min w/FA-DHA ([employer] PRENATAL GUMMY) 0.18-25 MG CHEW Chew 1 tablet by mouth daily. (Patient not taking: Reported on 09/24/2022) 30 tablet 3     ROS Pertinent positives and negative per HPI, all others reviewed and negative  Physical Exam   BP 112/74   Pulse 88   Temp 99 F (37.2 C) (Oral)   Resp 16   Ht 5\' 4"  (1.626 m)   Wt 60.9 kg   SpO2 100%   BMI 23.05 kg/m   Patient Vitals for the past 24 hrs:  BP Temp Temp src Pulse Resp SpO2 Height Weight  09/26/22 1417 112/74 99 F (37.2 C) Oral 88 16 -- -- --  09/26/22 1416 -- -- -- -- -- 100 % -- --  09/26/22 1411 -- -- -- -- -- 99 % -- --  09/26/22 1406 -- -- -- -- -- 96 % -- --  09/26/22 1401 -- -- -- -- -- 99 % -- --  09/26/22 1356 -- -- -- -- -- 96 % -- --  09/26/22 1351 -- -- -- -- -- 96 % -- --  09/26/22 1346 -- -- -- -- -- 96 % -- --  09/26/22 1341 -- -- -- -- -- 98 % -- --  09/26/22 1336 -- -- -- -- -- 95 % -- --  09/26/22 1331 -- -- -- -- -- 94 % -- --  09/26/22 1326 -- -- -- -- -- 97 % -- --  09/26/22 1321 -- -- -- -- -- 95 % -- --  09/26/22 1148 106/72 (!) 102.7 F (39.3 C) Oral (!) 122 20 -- -- --  09/26/22 1142 -- -- -- -- -- -- 5\' 4"  (1.626 m) 60.9 kg    Physical Exam Constitutional:      General: She is not in acute distress.    Appearance: Normal appearance. She is not toxic-appearing or diaphoretic.  HENT:     Head: Normocephalic  and atraumatic.     Mouth/Throat:     Mouth: Mucous membranes are moist.  Eyes:     General: No scleral icterus. Cardiovascular:     Rate and Rhythm: Normal rate and regular rhythm.     Heart sounds: Murmur heard.      Comments: 2/6 systolic murmur Pulmonary:     Effort: Pulmonary effort is normal. No respiratory distress.     Breath sounds: Normal breath sounds. No wheezing or rales.  Abdominal:     General: Abdomen is flat.     Tenderness: There is abdominal tenderness. There is guarding. There is no rebound.     Comments: Mildly hypoactive bowel sounds. Tender to palpation throughout, increased throughout R side. Some guarding, no rebound.  Skin:    General: Skin is warm and dry.  Neurological:     General: No focal deficit present.     Mental Status: She is alert.  Psychiatric:        Mood and Affect: Mood normal.        Behavior: Behavior normal.      Cervical Exam    Bedside Ultrasound Not performed.  My interpretation: n/a  FHT N/a  Labs Results for orders placed or performed during the hospital encounter of 09/26/22 (from the past 24 hour(s))  Urinalysis, Routine w reflex microscopic -Urine, Clean Catch     Status: Abnormal   Collection Time: 09/26/22 12:45 PM  Result Value Ref Range   Color, Urine AMBER (A) YELLOW   APPearance CLEAR CLEAR   Specific Gravity, Urine 1.020 1.005 - 1.030   pH 5.0 5.0 - 8.0   Glucose, UA NEGATIVE NEGATIVE mg/dL   Hgb urine dipstick SMALL (A) NEGATIVE   Bilirubin Urine NEGATIVE NEGATIVE   Ketones, ur NEGATIVE NEGATIVE mg/dL   Protein, ur 30 (A) NEGATIVE mg/dL   Nitrite NEGATIVE NEGATIVE   Leukocytes,Ua SMALL (A) NEGATIVE   RBC / HPF 0-5 0 - 5 RBC/hpf   WBC, UA 11-20 0 - 5 WBC/hpf   Bacteria, UA NONE SEEN NONE SEEN   Squamous Epithelial / HPF 0-5 0 - 5 /HPF   Mucus PRESENT   CBC with Differential     Status: Abnormal   Collection Time: 09/26/22 12:56 PM  Result Value Ref Range   WBC 13.2 (H) 4.0 - 10.5 K/uL   RBC 5.75 (H) 3.87 - 5.11 MIL/uL   Hemoglobin 15.3 (H) 12.0 - 15.0 g/dL   HCT 16.1 (H) 09.6 - 04.5 %   MCV 82.4 80.0 - 100.0 fL   MCH 26.6 26.0 - 34.0 pg   MCHC 32.3 30.0 - 36.0 g/dL   RDW 40.9 (H) 81.1 - 91.4 %   Platelets 651  (H) 150 - 400 K/uL   nRBC 0.0 0.0 - 0.2 %   Neutrophils Relative % 88 %   Neutro Abs 11.6 (H) 1.7 - 7.7 K/uL   Lymphocytes Relative 7 %   Lymphs Abs 1.0 0.7 - 4.0 K/uL   Monocytes Relative 4 %   Monocytes Absolute 0.5 0.1 - 1.0 K/uL   Eosinophils Relative 0 %   Eosinophils Absolute 0.0 0.0 - 0.5 K/uL   Basophils Relative 0 %   Basophils Absolute 0.0 0.0 - 0.1 K/uL   Immature Granulocytes 1 %   Abs Immature Granulocytes 0.07 0.00 - 0.07 K/uL  Comprehensive metabolic panel     Status: Abnormal   Collection Time: 09/26/22 12:56 PM  Result Value Ref Range   Sodium 131 (L) 135 -  145 mmol/L   Potassium 4.1 3.5 - 5.1 mmol/L   Chloride 99 98 - 111 mmol/L   CO2 20 (L) 22 - 32 mmol/L   Glucose, Bld 118 (H) 70 - 99 mg/dL   BUN 11 6 - 20 mg/dL   Creatinine, Ser 4.780.82 0.44 - 1.00 mg/dL   Calcium 8.7 (L) 8.9 - 10.3 mg/dL   Total Protein 7.4 6.5 - 8.1 g/dL   Albumin 3.3 (L) 3.5 - 5.0 g/dL   AST 22 15 - 41 U/L   ALT 29 0 - 44 U/L   Alkaline Phosphatase 83 38 - 126 U/L   Total Bilirubin 1.7 (H) 0.3 - 1.2 mg/dL   GFR, Estimated >29>60 >56>60 mL/min   Anion gap 12 5 - 15  Lactic acid, plasma     Status: None   Collection Time: 09/26/22 12:56 PM  Result Value Ref Range   Lactic Acid, Venous 0.9 0.5 - 1.9 mmol/L  Type and screen [facility]     Status: None   Collection Time: 09/26/22 12:56 PM  Result Value Ref Range   ABO/RH(D) B POS    Antibody Screen NEG    Sample Expiration      09/29/2022,2359 Performed at [facility], [address] [place], [place] [address]   Resp Panel by RT-PCR (Flu A&B, Covid) Anterior Nasal Swab     Status: None   Collection Time: 09/26/22 12:56 PM   Specimen: Anterior Nasal Swab  Result Value Ref Range   SARS Coronavirus 2 by RT PCR NEGATIVE NEGATIVE   Influenza A by PCR NEGATIVE NEGATIVE   Influenza B by PCR NEGATIVE NEGATIVE  Lactic acid, plasma     Status: None   Collection Time: 09/26/22  3:17 PM  Result Value Ref Range   Lactic  Acid, Venous 0.8 0.5 - 1.9 mmol/L    Imaging CT ABDOMEN PELVIS W CONTRAST  Result Date: 09/26/2022 CLINICAL DATA:  Sepsis, postop, abdominal pain. Post cesarean section 09/11/2022. EXAM: CT ABDOMEN AND PELVIS WITH CONTRAST TECHNIQUE: Multidetector CT imaging of the abdomen and pelvis was performed using the standard protocol following bolus administration of intravenous contrast. RADIATION DOSE REDUCTION: This exam was performed according to the departmental dose-optimization program which includes automated exposure control, adjustment of the mA and/or kV according to patient size and/or use of iterative reconstruction technique. CONTRAST:  75mL OMNIPAQUE IOHEXOL 350 MG/ML SOLN COMPARISON:  09/12/2022 FINDINGS: Lower chest: Lung bases are clear. Hepatobiliary: Liver, gallbladder and biliary tree are normal. Pancreas: Normal. Spleen: Normal. Adrenals/Urinary Tract: Adrenal glands are normal. Kidneys are normal in size. Minimal prominence of the intrarenal collecting systems bilaterally. Ureters are normal. Mild bladder distention. Stomach/Bowel: Stomach and small bowel are within normal. Appendix not visualized. Colon is unremarkable. Vascular/Lymphatic: Abdominal aorta is normal in caliber. Remaining vascular structures are unremarkable. No adenopathy. Reproductive: Mild prominence of the uterus with interval decrease in size compatible patient's recent postpartum state. Small fleck of air over the endometrium and lower uterine segment which may be due to recent instrumentation versus infection/endometritis. Significant decrease in peritoneal fluid only a small amount of perihepatic fluid in the upper abdomen. Interval improvement in previously seen pelvic and peritoneal hemorrhage. No significant fluid/hemorrhage in the pelvis. There is a partially rim enhancing multiloculated fluid collection over the midline anterior mid to lower abdomen extending inferiorly to the pelvis anterior to the uterus and  bladder. This collection measures approximately 4.2 x 6.2 x 10.9 cm in AP, transverse and craniocaudal  dimensions. This may represent noninfected fluid versus evolving abscess/phlegmon. Other: None. Musculoskeletal: No focal abnormality. IMPRESSION: 1. Mild prominence of the uterus with interval decrease in size compatible with patient's recent postpartum state. Small fleck of air over the endometrium and lower uterine segment which may be due to recent instrumentation versus infection/endometritis. 2. Partially rim enhancing multiloculated fluid collection over the midline anterior mid to lower abdomen extending inferiorly to the pelvis anterior to the uterus and bladder. This collection measures approximately 4.2 x 6.2 x 10.9 cm in AP, transverse and craniocaudal dimensions. This is concerning for an evolving abscess/phlegmon and less likely noninfected postsurgical fluid. 3. Significant decrease in peritoneal fluid only a small amount of perihepatic fluid in the upper abdomen. Interval improvement/resolution in previously seen pelvic and peritoneal hemorrhage. Electronically Signed   By: Elberta Fortis M.D.   On: 09/26/2022 16:40   DG Chest Port 1 View  Result Date: 09/26/2022 CLINICAL DATA:  Sepsis EXAM: PORTABLE CHEST 1 VIEW COMPARISON:  04/22/2022 and prior studies FINDINGS: The cardiomediastinal silhouette is unremarkable. There is no evidence of focal airspace disease, pulmonary edema, suspicious pulmonary nodule/mass, pleural effusion, or pneumothorax. No acute bony abnormalities are identified. IMPRESSION: No active disease. Electronically Signed   By: Harmon Pier M.D.   On: 09/26/2022 13:57    MAU Course  Procedures Lab Orders         Culture, blood (x 2)         Resp Panel by RT-PCR (Flu A&B, Covid) Anterior Nasal Swab         CBC with Differential         Comprehensive metabolic panel         Lactic acid, plasma         Urinalysis, Routine w reflex microscopic -Urine, Clean Catch          CBC         Protime-INR         APTT     Meds ordered this encounter  Medications   DISCONTD: lactated ringers infusion   AND Linked Order Group    lactated ringers bolus 1,000 mL     Order Specific Question:   Total Body Weight basis for 30 mL/kg  bolus delivery     Answer:   60.9 kg    lactated ringers bolus 1,000 mL     Order Specific Question:   Total Body Weight basis for 30 mL/kg  bolus delivery     Answer:   60.9 kg   ibuprofen (ADVIL) tablet 600 mg   HYDROmorphone (DILAUDID) injection 1 mg   DISCONTD: levofloxacin (LEVAQUIN) IVPB 750 mg    Order Specific Question:   Antibiotic Indication:    Answer:   Intra-abdominal Infection   DISCONTD: metroNIDAZOLE (FLAGYL) IVPB 500 mg    Order Specific Question:   Antibiotic Indication:    Answer:   Intra-abdominal Infection   ceFEPIme (MAXIPIME) 2 g in sodium chloride 0.9 % 100 mL IVPB    Order Specific Question:   Antibiotic Indication:    Answer:   Other Indication (list below)    Order Specific Question:   Other Indication:    Answer:   intra-abdominal infection   iohexol (OMNIPAQUE) 350 MG/ML injection 75 mL   metroNIDAZOLE (FLAGYL) IVPB 500 mg    Order Specific Question:   Antibiotic Indication:    Answer:   Intra-abdominal Infection   metoprolol succinate (TOPROL-XL) 24 hr tablet 25 mg   NIFEdipine (PROCARDIA-XL/NIFEDICAL-XL) 24 hr tablet  60 mg   lactated ringers infusion   acetaminophen (TYLENOL) tablet 650 mg   docusate sodium (COLACE) capsule 100 mg   calcium carbonate (TUMS - dosed in mg elemental calcium) chewable tablet 400 mg of elemental calcium   prenatal multivitamin tablet 1 tablet   lactated ringers infusion   Imaging Orders         DG Chest Port 1 View         CT ABDOMEN PELVIS W CONTRAST      MDM High (Level 5)  Assessment and Plan  #Intra-abdominal abscess #Post-operative state #Sepsis Patient presenting with abdominal pain and fever, is found to have an intra-abdominal abscess as outlined above  on CT scan. On arrival code sepsis was activated and patient had already been started on empiric antibiotics which will be continued. Plan for admission and consultation with IR for possible drainage.     Dispo: admit to antepartum unit    Venora Maples, MD/MPH 09/26/22 5:12 PM

## 2022-09-26 NOTE — Sepsis Progress Note (Signed)
1315 Notified provider of need to order antibiotics. 1450 Checked with bedside RN, blood cultures were obtained. Not populating yet.

## 2022-09-26 NOTE — Consult Note (Signed)
Pharmacy Antibiotic Note  [name] is a 37 y.o. female admitted on 09/26/2022 with sepsis. She is s/p repeat LTCS on 09/12/22. Pharmacy has been consulted for levofloxacin dosing.  Plan: After discussion with Dr. Crissie Reese, antibiotics will be changed cefepime + flagyl for presumed intra-abdominal infecton.  Height: 5\' 4"  (162.6 cm) Weight: 60.9 kg (134 lb 4.8 oz) IBW/kg (Calculated) : 54.7  Temp (24hrs), Avg:102.7 F (39.3 C), Min:102.7 F (39.3 C), Max:102.7 F (39.3 C)  No results for input(s): "WBC", "CREATININE", "LATICACIDVEN", "VANCOTROUGH", "VANCOPEAK", "VANCORANDOM", "GENTTROUGH", "GENTPEAK", "GENTRANDOM", "TOBRATROUGH", "TOBRAPEAK", "TOBRARND", "AMIKACINPEAK", "AMIKACINTROU", "AMIKACIN" in the last 168 hours.  Estimated Creatinine Clearance: 83.9 mL/min (by C-G formula based on SCr of 0.67 mg/dL).    Allergies  Allergen Reactions   Other Other (See Comments)   Penicillins Rash    Antimicrobials this admission: cefepime 2g q8 (4/10>>  metronidazole 500 mg q12 (4/10>>  Dose adjustments this admission: N/A - Scr pending   Microbiology results: 3/27 Bcx: NG final  4/10 BCx: pending  4/10 UCx: pending    Thank you for allowing pharmacy to be a part of this patient's care.  Janey Greaser 09/26/2022 1:38 PM

## 2022-09-26 NOTE — H&P (Signed)
[facility] ANTEPARTUM ADMISSION HISTORY AND PHYSICAL NOTE   History of Present Illness: [name] is a 37 y.o. s/p RCS x3 on 3/27 c/b hemorrhagic shock requiring pRBC transfusion, HFrEF managed in the ICU, and poorly controlled HTN, who presents with fever and incisional pain.    Reports she has been having R sided abdominal pain since last night that has been progressively worsening Has been taking all meds as prescribed, no unusual events yesterday Endorses a mild cough but no runny nose No chest pain or shortness of breath No burning or pain with urination No diarrhea Has been passing gas and having bowel movements   Patient Active Problem List   Diagnosis Date Noted   Intra-abdominal abscess 09/26/2022   Postoperative hemorrhagic shock 09/13/2022   LV dysfunction 09/13/2022   Severe preeclampsia 09/11/2022   Anemia 07/28/2022   RED CHART ROUNDS 06/20/2022   Previous cesarean delivery affecting pregnancy 05/22/2022   Systolic heart failure 04/23/2022   Maternal mitral valve regurgitation affecting pregnancy in third trimester, antepartum 04/23/2022   Pyelonephritis affecting pregnancy in second trimester 04/22/2022   UTI in pregnancy 04/06/2022   Abnormal Pap smear of cervix 04/06/2022   History of gestational hypertension 03/24/2022   Multigravida of advanced maternal age in third trimester 03/24/2022   History of preterm delivery, currently pregnant 03/24/2022   Supervision of high risk pregnancy, antepartum 03/23/2022   Status post repeat low transverse cesarean section 02/03/2015    Past Medical History:  Diagnosis Date   Bacterial infection    History of chicken pox    Systolic heart failure     Past Surgical History:  Procedure Laterality Date   CESAREAN SECTION     CESAREAN SECTION N/A 02/02/2015   Procedure: CESAREAN SECTION;  Surgeon: Geryl RankinsEvelyn Varnado, MD;  Location: [facility];  Service: Obstetrics;  Laterality: N/A;   CESAREAN SECTION N/A 09/11/2022    Procedure: CESAREAN SECTION;  Surgeon: Lazaro ArmsEure, Luther H, MD;  Location: [facility];  Service: Obstetrics;  Laterality: N/A;   DILATION AND CURETTAGE OF UTERUS     WISDOM TOOTH EXTRACTION      OB History  Gravida Para Term Preterm AB Living  5 3 1 2 2 3   SAB IAB Ectopic Multiple Live Births  1 1   0 3    # Outcome Date GA Lbr Len/2nd Weight Sex Delivery Anes PTL Lv  5 Preterm 09/11/22 [id number]  2330 g F CS-LTranv Spinal  LIV  4 Preterm 02/02/15 [id number]  2520 g M CS-LVertical Spinal  LIV  3 SAB 2012          2 Term 2009    F CS-LTranv   LIV  1 IAB 2003            Social History   Socioeconomic History   Marital status: Single    Spouse name: Not on file   Number of children: Not on file   Years of education: Not on file   Highest education level: Not on file  Occupational History   Not on file  Tobacco Use   Smoking status: Never   Smokeless tobacco: Never  Vaping Use   Vaping Use: Never used  Substance and Sexual Activity   Alcohol use: No   Drug use: No   Sexual activity: Not Currently    Birth control/protection: None  Other Topics Concern   Not on file  Social History Narrative   Not on file   Social Determinants of Health  Financial Resource Strain: Not on file  Food Insecurity: No Food Insecurity (09/14/2022)   Hunger Vital Sign    Worried About Running Out of Food in the Last Year: Never true    Ran Out of Food in the Last Year: Never true  Transportation Needs: No Transportation Needs (09/14/2022)   PRAPARE - Administrator, Civil Service (Medical): No    Lack of Transportation (Non-Medical): No  Physical Activity: Not on file  Stress: Not on file  Social Connections: Not on file    Family History  Problem Relation Age of Onset   Hypertension Mother    Hypertension Father    Diabetes Father    Hypertension Paternal Aunt    Asthma Neg Hx    Cancer Neg Hx    Heart disease Neg Hx    Stroke Neg Hx     Allergies  Allergen Reactions    Other Other (See Comments)   Penicillins Rash    Medications Prior to Admission  Medication Sig Dispense Refill Last Dose   metoprolol succinate (TOPROL XL) 25 MG 24 hr tablet Take 1 tablet (25 mg total) by mouth daily. 30 tablet 3 09/25/2022   NIFEdipine (ADALAT CC) 60 MG 24 hr tablet Take 1 tablet (60 mg total) by mouth 2 (two) times daily. 60 tablet 3 09/25/2022   oxyCODONE (OXY IR/ROXICODONE) 5 MG immediate release tablet Take 1 tablet (5 mg total) by mouth every 4 (four) hours as needed for moderate pain. 20 tablet 0 09/26/2022   aspirin (ASPIRIN CHILDRENS) 81 MG chewable tablet Chew 1 tablet (81 mg total) by mouth daily. (Patient not taking: Reported on 09/24/2022) 90 tablet 1    ferrous sulfate 325 (65 FE) MG EC tablet Take 1 tablet (325 mg total) by mouth every other day. (Patient not taking: Reported on 09/05/2022) 60 tablet 1    furosemide (LASIX) 20 MG tablet Take 1 tablet (20 mg total) by mouth daily. 30 tablet 3    ibuprofen (ADVIL) 600 MG tablet Take 1 tablet (600 mg total) by mouth every 6 (six) hours as needed for moderate pain, cramping or fever. (Patient not taking: Reported on 09/24/2022) 30 tablet 0    Prenatal MV & Min w/FA-DHA ([employer] PRENATAL GUMMY) 0.18-25 MG CHEW Chew 1 tablet by mouth daily. (Patient not taking: Reported on 09/24/2022) 30 tablet 3     Review of Systems Pertinent positives and negative per HPI, all others reviewed and negative   Vitals:  BP 112/74   Pulse 88   Temp 99 F (37.2 C) (Oral)   Resp 16   Ht  (1.626 m)   Wt 60.9 kg   SpO2 100%   BMI 23.05 kg/m  Physical Examination: Constitutional:      General: She is not in acute distress.    Appearance: Normal appearance. She is not toxic-appearing or diaphoretic.  HENT:     Head: Normocephalic and atraumatic.     Mouth/Throat:     Mouth: Mucous membranes are moist.  Eyes:     General: No scleral icterus. Cardiovascular:     Rate and Rhythm: Normal rate and regular rhythm.     Heart sounds: Murmur  heard.     Comments: 2/6 systolic murmur Pulmonary:     Effort: Pulmonary effort is normal. No respiratory distress.     Breath sounds: Normal breath sounds. No wheezing or rales.  Abdominal:     General: Abdomen is flat.     Tenderness: There  is abdominal tenderness. There is guarding. There is no rebound.     Comments: Mildly hypoactive bowel sounds. Tender to palpation throughout, increased throughout R side. Some guarding, no rebound.  Skin:    General: Skin is warm and dry.  Neurological:     General: No focal deficit present.     Mental Status: She is alert.  Psychiatric:        Mood and Affect: Mood normal.        Behavior: Behavior normal.   Labs:  Results for orders placed or performed during the hospital encounter of 09/26/22 (from the past 24 hour(s))  Urinalysis, Routine w reflex microscopic -Urine, Clean Catch   Collection Time: 09/26/22 12:45 PM  Result Value Ref Range   Color, Urine AMBER (A) YELLOW   APPearance CLEAR CLEAR   Specific Gravity, Urine 1.020 1.005 - 1.030   pH 5.0 5.0 - 8.0   Glucose, UA NEGATIVE NEGATIVE mg/dL   Hgb urine dipstick SMALL (A) NEGATIVE   Bilirubin Urine NEGATIVE NEGATIVE   Ketones, ur NEGATIVE NEGATIVE mg/dL   Protein, ur 30 (A) NEGATIVE mg/dL   Nitrite NEGATIVE NEGATIVE   Leukocytes,Ua SMALL (A) NEGATIVE   RBC / HPF 0-5 0 - 5 RBC/hpf   WBC, UA 11-20 0 - 5 WBC/hpf   Bacteria, UA NONE SEEN NONE SEEN   Squamous Epithelial / HPF 0-5 0 - 5 /HPF   Mucus PRESENT   Resp Panel by RT-PCR (Flu A&B, Covid) Anterior Nasal Swab   Collection Time: 09/26/22 12:56 PM   Specimen: Anterior Nasal Swab  Result Value Ref Range   SARS Coronavirus 2 by RT PCR NEGATIVE NEGATIVE   Influenza A by PCR NEGATIVE NEGATIVE   Influenza B by PCR NEGATIVE NEGATIVE  CBC with Differential   Collection Time: 09/26/22 12:56 PM  Result Value Ref Range   WBC 13.2 (H) 4.0 - 10.5 K/uL   RBC 5.75 (H) 3.87 - 5.11 MIL/uL   Hemoglobin 15.3 (H) 12.0 - 15.0 g/dL   HCT  07.1 (H) 21.9 - 46.0 %   MCV 82.4 80.0 - 100.0 fL   MCH 26.6 26.0 - 34.0 pg   MCHC 32.3 30.0 - 36.0 g/dL   RDW 75.8 (H) 83.2 - 54.9 %   Platelets 651 (H) 150 - 400 K/uL   nRBC 0.0 0.0 - 0.2 %   Neutrophils Relative % 88 %   Neutro Abs 11.6 (H) 1.7 - 7.7 K/uL   Lymphocytes Relative 7 %   Lymphs Abs 1.0 0.7 - 4.0 K/uL   Monocytes Relative 4 %   Monocytes Absolute 0.5 0.1 - 1.0 K/uL   Eosinophils Relative 0 %   Eosinophils Absolute 0.0 0.0 - 0.5 K/uL   Basophils Relative 0 %   Basophils Absolute 0.0 0.0 - 0.1 K/uL   Immature Granulocytes 1 %   Abs Immature Granulocytes 0.07 0.00 - 0.07 K/uL  Comprehensive metabolic panel   Collection Time: 09/26/22 12:56 PM  Result Value Ref Range   Sodium 131 (L) 135 - 145 mmol/L   Potassium 4.1 3.5 - 5.1 mmol/L   Chloride 99 98 - 111 mmol/L   CO2 20 (L) 22 - 32 mmol/L   Glucose, Bld 118 (H) 70 - 99 mg/dL   BUN 11 6 - 20 mg/dL   Creatinine, Ser 8.26 0.44 - 1.00 mg/dL   Calcium 8.7 (L) 8.9 - 10.3 mg/dL   Total Protein 7.4 6.5 - 8.1 g/dL   Albumin 3.3 (L) 3.5 - 5.0  g/dL   AST 22 15 - 41 U/L   ALT 29 0 - 44 U/L   Alkaline Phosphatase 83 38 - 126 U/L   Total Bilirubin 1.7 (H) 0.3 - 1.2 mg/dL   GFR, Estimated >75 >91 mL/min   Anion gap 12 5 - 15  Lactic acid, plasma   Collection Time: 09/26/22 12:56 PM  Result Value Ref Range   Lactic Acid, Venous 0.9 0.5 - 1.9 mmol/L  Type and screen [name]   Collection Time: 09/26/22 12:56 PM  Result Value Ref Range   ABO/RH(D) B POS    Antibody Screen NEG    Sample Expiration      09/29/2022,2359 Performed at [facility], [address] [place], [place] [address]   Lactic acid, plasma   Collection Time: 09/26/22  3:17 PM  Result Value Ref Range   Lactic Acid, Venous 0.8 0.5 - 1.9 mmol/L    Imaging Studies: CT ABDOMEN PELVIS W CONTRAST  Result Date: 09/26/2022 CLINICAL DATA:  Sepsis, postop, abdominal pain. Post cesarean section 09/11/2022. EXAM: CT ABDOMEN AND  PELVIS WITH CONTRAST TECHNIQUE: Multidetector CT imaging of the abdomen and pelvis was performed using the standard protocol following bolus administration of intravenous contrast. RADIATION DOSE REDUCTION: This exam was performed according to the departmental dose-optimization program which includes automated exposure control, adjustment of the mA and/or kV according to patient size and/or use of iterative reconstruction technique. CONTRAST:  53mL OMNIPAQUE IOHEXOL 350 MG/ML SOLN COMPARISON:  09/12/2022 FINDINGS: Lower chest: Lung bases are clear. Hepatobiliary: Liver, gallbladder and biliary tree are normal. Pancreas: Normal. Spleen: Normal. Adrenals/Urinary Tract: Adrenal glands are normal. Kidneys are normal in size. Minimal prominence of the intrarenal collecting systems bilaterally. Ureters are normal. Mild bladder distention. Stomach/Bowel: Stomach and small bowel are within normal. Appendix not visualized. Colon is unremarkable. Vascular/Lymphatic: Abdominal aorta is normal in caliber. Remaining vascular structures are unremarkable. No adenopathy. Reproductive: Mild prominence of the uterus with interval decrease in size compatible patient's recent postpartum state. Small fleck of air over the endometrium and lower uterine segment which may be due to recent instrumentation versus infection/endometritis. Significant decrease in peritoneal fluid only a small amount of perihepatic fluid in the upper abdomen. Interval improvement in previously seen pelvic and peritoneal hemorrhage. No significant fluid/hemorrhage in the pelvis. There is a partially rim enhancing multiloculated fluid collection over the midline anterior mid to lower abdomen extending inferiorly to the pelvis anterior to the uterus and bladder. This collection measures approximately 4.2 x 6.2 x 10.9 cm in AP, transverse and craniocaudal dimensions. This may represent noninfected fluid versus evolving abscess/phlegmon. Other: None. Musculoskeletal:  No focal abnormality. IMPRESSION: 1. Mild prominence of the uterus with interval decrease in size compatible with patient's recent postpartum state. Small fleck of air over the endometrium and lower uterine segment which may be due to recent instrumentation versus infection/endometritis. 2. Partially rim enhancing multiloculated fluid collection over the midline anterior mid to lower abdomen extending inferiorly to the pelvis anterior to the uterus and bladder. This collection measures approximately 4.2 x 6.2 x 10.9 cm in AP, transverse and craniocaudal dimensions. This is concerning for an evolving abscess/phlegmon and less likely noninfected postsurgical fluid. 3. Significant decrease in peritoneal fluid only a small amount of perihepatic fluid in the upper abdomen. Interval improvement/resolution in previously seen pelvic and peritoneal hemorrhage. Electronically Signed   By: Elberta Fortis M.D.   On: 09/26/2022 16:40   DG Chest Port 1 View  Result Date:  09/26/2022 CLINICAL DATA:  Sepsis EXAM: PORTABLE CHEST 1 VIEW COMPARISON:  04/22/2022 and prior studies FINDINGS: The cardiomediastinal silhouette is unremarkable. There is no evidence of focal airspace disease, pulmonary edema, suspicious pulmonary nodule/mass, pleural effusion, or pneumothorax. No acute bony abnormalities are identified. IMPRESSION: No active disease. Electronically Signed   By: Harmon Pier M.D.   On: 09/26/2022 13:57     Assessment and Plan: Patient Active Problem List   Diagnosis Date Noted   Intra-abdominal abscess 09/26/2022   Postoperative hemorrhagic shock 09/13/2022   LV dysfunction 09/13/2022   Severe preeclampsia 09/11/2022   Anemia 07/28/2022   RED CHART ROUNDS 06/20/2022   Previous cesarean delivery affecting pregnancy 05/22/2022   Systolic heart failure 04/23/2022   Maternal mitral valve regurgitation affecting pregnancy in third trimester, antepartum 04/23/2022   Pyelonephritis affecting pregnancy in second  trimester 04/22/2022   UTI in pregnancy 04/06/2022   Abnormal Pap smear of cervix 04/06/2022   History of gestational hypertension 03/24/2022   Multigravida of advanced maternal age in third trimester 03/24/2022   History of preterm delivery, currently pregnant 03/24/2022   Supervision of high risk pregnancy, antepartum 03/23/2022   Status post repeat low transverse cesarean section 02/03/2015   #Intra-abdominal abscess #Post-operative state s/p cesarean section #Sepsis Patient presenting with abdominal pain and fever, is found to have an intra-abdominal abscess as outlined above on CT scan. On arrival code sepsis was activated and patient had already been started on empiric antibiotics (Cefepime/Flagyl) which will be continued. Plan for admission and consultation with IR for possible drainage. NPO at midnight.   #Chronic Hypertension #HFrEF BP well controlled since presentation on Nifedipine and Metoprolol, no signs of volume overload on exam. Has received a total of 2L LR, at this point will defer further IV fluids as I do not want to exacerbate her CHF.   Venora Maples, MD/MPH Attending Family Medicine Physician, [facility] for [facility], [facility]

## 2022-09-26 NOTE — Sepsis Progress Note (Signed)
Code Sepsis protocol being monitored by eLink. 

## 2022-09-27 ENCOUNTER — Inpatient Hospital Stay (HOSPITAL_COMMUNITY): Payer: Medicaid Other

## 2022-09-27 ENCOUNTER — Encounter (HOSPITAL_COMMUNITY): Payer: Self-pay | Admitting: Family Medicine

## 2022-09-27 LAB — CBC
HCT: 40.4 % (ref 36.0–46.0)
Hemoglobin: 13.6 g/dL (ref 12.0–15.0)
MCH: 27.1 pg (ref 26.0–34.0)
MCHC: 33.7 g/dL (ref 30.0–36.0)
MCV: 80.6 fL (ref 80.0–100.0)
Platelets: 553 10*3/uL — ABNORMAL HIGH (ref 150–400)
RBC: 5.01 MIL/uL (ref 3.87–5.11)
RDW: 15.6 % — ABNORMAL HIGH (ref 11.5–15.5)
WBC: 14.5 10*3/uL — ABNORMAL HIGH (ref 4.0–10.5)
nRBC: 0 % (ref 0.0–0.2)

## 2022-09-27 LAB — CBC WITH DIFFERENTIAL/PLATELET
Abs Immature Granulocytes: 0.02 10*3/uL (ref 0.00–0.07)
Basophils Absolute: 0 10*3/uL (ref 0.0–0.1)
Basophils Relative: 0 %
Eosinophils Absolute: 0 10*3/uL (ref 0.0–0.5)
Eosinophils Relative: 0 %
HCT: 44.6 % (ref 36.0–46.0)
Hemoglobin: 15 g/dL (ref 12.0–15.0)
Immature Granulocytes: 0 %
Lymphocytes Relative: 18 %
Lymphs Abs: 1.3 10*3/uL (ref 0.7–4.0)
MCH: 27.3 pg (ref 26.0–34.0)
MCHC: 33.6 g/dL (ref 30.0–36.0)
MCV: 81.1 fL (ref 80.0–100.0)
Monocytes Absolute: 0.4 10*3/uL (ref 0.1–1.0)
Monocytes Relative: 6 %
Neutro Abs: 5.6 10*3/uL (ref 1.7–7.7)
Neutrophils Relative %: 76 %
Platelets: 565 10*3/uL — ABNORMAL HIGH (ref 150–400)
RBC: 5.5 MIL/uL — ABNORMAL HIGH (ref 3.87–5.11)
RDW: 15.7 % — ABNORMAL HIGH (ref 11.5–15.5)
WBC: 7.4 10*3/uL (ref 4.0–10.5)
nRBC: 0 % (ref 0.0–0.2)

## 2022-09-27 LAB — FIBRINOGEN: Fibrinogen: 575 mg/dL — ABNORMAL HIGH (ref 210–475)

## 2022-09-27 LAB — CREATININE, SERUM
Creatinine, Ser: 0.7 mg/dL (ref 0.44–1.00)
GFR, Estimated: >60 mL/min (ref >60)

## 2022-09-27 LAB — AEROBIC/ANAEROBIC CULTURE W GRAM STAIN (SURGICAL/DEEP WOUND)

## 2022-09-27 LAB — PROTIME-INR
INR: 1.3 — ABNORMAL HIGH (ref 0.8–1.2)
INR: 1.5 — ABNORMAL HIGH (ref 0.8–1.2)
Prothrombin Time: 16.4 seconds — ABNORMAL HIGH (ref 11.4–15.2)
Prothrombin Time: 17.5 seconds — ABNORMAL HIGH (ref 11.4–15.2)

## 2022-09-27 LAB — APTT
aPTT: 41 seconds — ABNORMAL HIGH (ref 24–36)
aPTT: 40 seconds — ABNORMAL HIGH (ref 24–36)

## 2022-09-27 LAB — CULTURE, BLOOD (ROUTINE X 2)

## 2022-09-27 MED ORDER — MIDAZOLAM HCL 2 MG/2ML IJ SOLN
INTRAMUSCULAR | Status: AC | PRN
  Administered 2022-09-27: 1 mg via INTRAVENOUS

## 2022-09-27 MED ORDER — FENTANYL CITRATE (PF) 100 MCG/2ML IJ SOLN
INTRAMUSCULAR | Status: AC
  Filled 2022-09-27: qty 2

## 2022-09-27 MED ORDER — LACTATED RINGERS IV BOLUS
500.0000 mL | Freq: Once | INTRAVENOUS | Status: AC
  Administered 2022-09-27: 500 mL via INTRAVENOUS

## 2022-09-27 MED ORDER — OXYCODONE HCL 5 MG PO TABS
5.0000 mg | ORAL_TABLET | ORAL | Status: DC | PRN
  Administered 2022-09-27 – 2022-09-29 (×7): 5 mg via ORAL
  Filled 2022-09-27 (×8): qty 1

## 2022-09-27 MED ORDER — MIDAZOLAM HCL 2 MG/2ML IJ SOLN
INTRAMUSCULAR | Status: AC
  Filled 2022-09-27: qty 2

## 2022-09-27 MED ORDER — HYDROMORPHONE HCL 1 MG/ML IJ SOLN
0.5000 mg | INTRAMUSCULAR | Status: DC | PRN
  Administered 2022-09-30: 0.5 mg via INTRAVENOUS
  Filled 2022-09-27: qty 0.5

## 2022-09-27 MED ORDER — SODIUM CHLORIDE 0.9% FLUSH
5.0000 mL | Freq: Three times a day (TID) | INTRAVENOUS | Status: DC
  Administered 2022-09-27 – 2022-10-02 (×14): 5 mL

## 2022-09-27 MED ORDER — IOHEXOL 350 MG/ML SOLN
75.0000 mL | Freq: Once | INTRAVENOUS | Status: AC | PRN
  Administered 2022-09-27: 75 mL via INTRAVENOUS

## 2022-09-27 MED ORDER — FENTANYL CITRATE (PF) 100 MCG/2ML IJ SOLN
INTRAMUSCULAR | Status: AC | PRN
  Administered 2022-09-27 (×2): 25 ug via INTRAVENOUS

## 2022-09-27 MED ORDER — OXYCODONE HCL 5 MG PO TABS
10.0000 mg | ORAL_TABLET | ORAL | Status: DC | PRN
  Administered 2022-09-29 – 2022-10-02 (×18): 10 mg via ORAL
  Filled 2022-09-27 (×18): qty 2

## 2022-09-27 MED ORDER — VANCOMYCIN HCL IN DEXTROSE 1-5 GM/200ML-% IV SOLN
1000.0000 mg | Freq: Two times a day (BID) | INTRAVENOUS | Status: DC
  Administered 2022-09-28 – 2022-10-01 (×7): 1000 mg via INTRAVENOUS
  Filled 2022-09-27 (×11): qty 200

## 2022-09-27 MED ORDER — LIDOCAINE HCL 1 % IJ SOLN: 10.0000 mL | Freq: Once | INTRAMUSCULAR | Status: DC

## 2022-09-27 MED ORDER — ONDANSETRON 4 MG PO TBDP
4.0000 mg | ORAL_TABLET | Freq: Three times a day (TID) | ORAL | Status: DC | PRN
  Administered 2022-09-29 – 2022-09-30 (×2): 4 mg via ORAL
  Filled 2022-09-27 (×2): qty 1

## 2022-09-27 MED ORDER — VANCOMYCIN HCL 1250 MG/250ML IV SOLN
1250.0000 mg | Freq: Once | INTRAVENOUS | Status: AC
  Administered 2022-09-27: 1250 mg via INTRAVENOUS
  Filled 2022-09-27 (×2): qty 250

## 2022-09-27 NOTE — Procedures (Signed)
Vascular and Interventional Radiology Procedure Note  Patient: [name] DOB: 01-Aug-1985 Medical Record Number: [id number] Note Date/Time: 09/27/22 9:30 AM   Performing Physician: Roanna Banning, MD Assistant(s): None  Diagnosis: Post C-section with pelvic fluid collection  Procedure: DRAINAGE CATHETER PLACEMENT into PELVIC ABSCESS  Anesthesia: Conscious Sedation Complications: None Estimated Blood Loss: Minimal Specimens: Sent for Gram Stain, Aerobe Culture, and Anerobe Culture  Findings:  Successful CT-guided drain placement of 14 F catheter into pelvic fluid collection.  Plan:  - Flush drain with 5 mL Normal Saline every 8 hours. - Follow up drain evaluation / sinogram in 10 day(s).  See detailed procedure note with images in PACS. The patient tolerated the procedure well without incident or complication and was returned to Floor Bed in stable condition.    Roanna Banning, MD [facility] Inter[facility]   Pager. [phone] Clinic. [phone]

## 2022-09-27 NOTE — Progress Notes (Addendum)
ADDENDUM 09/26/21 2:49PM Re-evaluated the patient around 1pm. Pt under much better control, however pt had fever to 101.9 with HR 135bpm. She received tylenol and 500cc bolus. She had no evidence of fluid overload on exam and her abdomen was soft, mildly tender but distended.  I reviewed her labs and repeat imaging - Hgb stable 15.0, INR elevated at 1.5 from 1.3, PTT stable at 40, fibrinogen 575; overall reassuring at this time. I discussed her CTA with IR - no active extravasation, drain appropriately placed. 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 Would ideally avoid surgical intervention given complexity of her CS, but will need to see how she evolves clinically.  Will broaden abx to vanc/cefepime/flagyl given persistent fever. Narrow pending culture data.  Harvie Bridge, MD Obstetrician & Gynecologist, [facility] for [facility], [facility]   FACULTY PRACTICE COMPREHENSIVE PROGRESS NOTE  [name] is a 37 y.o. [phone] POD16 s/p RCS and history of HFrEF, HTN who is readmitted for pelvic abscess now s/p IR drainage on 4/11  Length of Stay:  1 Days. Admitted 09/26/2022  Subjective: Evaluated patient after drain placement. She reports severe abdominal pain and significant output into her drain. She denies CP/SOB/palpitations.   Vitals:  Blood pressure 115/74, pulse (!) 120, temperature 98.4 F (36.9 C), temperature source Oral, resp. rate 20, height 5\' 4"  (1.626 m), weight 60.9 kg, SpO2 96 %, unknown if currently breastfeeding. Physical Examination: CONSTITUTIONAL: Well-developed, well-nourished female in no acute distress.  NEUROLOGIC: Alert and oriented to person, place, and time. No cranial nerve deficit noted. PSYCHIATRIC: Normal mood and affect. Normal behavior. Normal judgment and thought content. CARDIOVASCULAR: Normal heart rate noted, regular rhythm RESPIRATORY: Effort and breath sounds normal, no problems with  respiration noted MUSCULOSKELETAL: Normal range of motion. No edema and no tenderness.  ABDOMEN: Firm, distended, diffusely tender with guarding. Drain output 150cc of blood in past hour  Results for orders placed or performed during the hospital encounter of 09/26/22 (from the past 48 hour(s))  Urinalysis, Routine w reflex microscopic -Urine, Clean Catch     Status: Abnormal   Collection Time: 09/26/22 12:45 PM  Result Value Ref Range   Color, Urine AMBER (A) YELLOW    Comment: BIOCHEMICALS MAY BE AFFECTED BY COLOR   APPearance CLEAR CLEAR   Specific Gravity, Urine 1.020 1.005 - 1.030   pH 5.0 5.0 - 8.0   Glucose, UA NEGATIVE NEGATIVE mg/dL   Hgb urine dipstick SMALL (A) NEGATIVE   Bilirubin Urine NEGATIVE NEGATIVE   Ketones, ur NEGATIVE NEGATIVE mg/dL   Protein, ur 30 (A) NEGATIVE mg/dL   Nitrite NEGATIVE NEGATIVE   Leukocytes,Ua SMALL (A) NEGATIVE   RBC / HPF 0-5 0 - 5 RBC/hpf   WBC, UA 11-20 0 - 5 WBC/hpf   Bacteria, UA NONE SEEN NONE SEEN   Squamous Epithelial / HPF 0-5 0 - 5 /HPF   Mucus PRESENT     Comment: Performed at [facility], [address], [place], [place] [address]  Culture, blood (x 2)     Status: None (Preliminary result)   Collection Time: 09/26/22 12:54 PM   Specimen: BLOOD  Result Value Ref Range   Specimen Description BLOOD SITE NOT SPECIFIED    Special Requests      BOTTLES DRAWN AEROBIC AND ANAEROBIC Blood Culture adequate volume   Culture      NO GROWTH < 24 HOURS Performed at [facility], [address],  [place], [place] [address]    Report Status PENDING   CBC with Differential     Status: Abnormal   Collection Time: 09/26/22 12:56 PM  Result Value Ref Range   WBC 13.2 (H) 4.0 - 10.5 K/uL   RBC 5.75 (H) 3.87 - 5.11 MIL/uL   Hemoglobin 15.3 (H) 12.0 - 15.0 g/dL   HCT 19.1 (H) 47.8 - 29.5 %   MCV 82.4 80.0 - 100.0 fL   MCH 26.6 26.0 - 34.0 pg   MCHC 32.3 30.0 - 36.0 g/dL   RDW 62.1 (H) 30.8 - 65.7 %   Platelets 651 (H) 150 - 400  K/uL   nRBC 0.0 0.0 - 0.2 %   Neutrophils Relative % 88 %   Neutro Abs 11.6 (H) 1.7 - 7.7 K/uL   Lymphocytes Relative 7 %   Lymphs Abs 1.0 0.7 - 4.0 K/uL   Monocytes Relative 4 %   Monocytes Absolute 0.5 0.1 - 1.0 K/uL   Eosinophils Relative 0 %   Eosinophils Absolute 0.0 0.0 - 0.5 K/uL   Basophils Relative 0 %   Basophils Absolute 0.0 0.0 - 0.1 K/uL   Immature Granulocytes 1 %   Abs Immature Granulocytes 0.07 0.00 - 0.07 K/uL    Comment: Performed at [facility], [address], [place], [place] [address]  Comprehensive metabolic panel     Status: Abnormal   Collection Time: 09/26/22 12:56 PM  Result Value Ref Range   Sodium 131 (L) 135 - 145 mmol/L   Potassium 4.1 3.5 - 5.1 mmol/L   Chloride 99 98 - 111 mmol/L   CO2 20 (L) 22 - 32 mmol/L   Glucose, Bld 118 (H) 70 - 99 mg/dL    Comment: Glucose reference range applies only to samples taken after fasting for at least 8 hours.   BUN 11 6 - 20 mg/dL   Creatinine, Ser 2.95 0.44 - 1.00 mg/dL   Calcium 8.7 (L) 8.9 - 10.3 mg/dL   Total Protein 7.4 6.5 - 8.1 g/dL   Albumin 3.3 (L) 3.5 - 5.0 g/dL   AST 22 15 - 41 U/L   ALT 29 0 - 44 U/L   Alkaline Phosphatase 83 38 - 126 U/L   Total Bilirubin 1.7 (H) 0.3 - 1.2 mg/dL   GFR, Estimated >28 >41 mL/min    Comment: (NOTE) Calculated using the CKD-EPI Creatinine Equation (2021)    Anion gap 12 5 - 15    Comment: Performed at [facility], [address] [place], [place] [address]  Lactic acid, plasma     Status: None   Collection Time: 09/26/22 12:56 PM  Result Value Ref Range   Lactic Acid, Venous 0.9 0.5 - 1.9 mmol/L    Comment: Performed at [facility], [address], [place], [place] [address]  Type and screen [name]     Status: None   Collection Time: 09/26/22 12:56 PM  Result Value Ref Range   ABO/RH(D) B POS    Antibody Screen NEG    Sample Expiration      09/29/2022,2359 Performed at [facility], [address]  [place], [place] [address]   Resp Panel by RT-PCR (Flu A&B, Covid) Anterior Nasal Swab     Status: None   Collection Time: 09/26/22 12:56 PM   Specimen: Anterior Nasal Swab  Result Value Ref Range   SARS Coronavirus 2 by RT PCR NEGATIVE NEGATIVE   Influenza A by  PCR NEGATIVE NEGATIVE   Influenza B by PCR NEGATIVE NEGATIVE    Comment: (NOTE) The Xpert Xpress SARS-CoV-2/FLU/RSV plus assay is intended as an aid in the diagnosis of influenza from Nasopharyngeal swab specimens and should not be used as a sole basis for treatment. Nasal washings and aspirates are unacceptable for Xpert Xpress SARS-CoV-2/FLU/RSV testing.  Fact Sheet for Patients: BloggerCourse.comhttps://www.fda.gov/media/152166/download  Fact Sheet for Healthcare Providers: SeriousBroker.ithttps://www.fda.gov/media/152162/download  This test is not yet approved or cleared by the [place] FDA and has been authorized for detection and/or diagnosis of SARS-CoV-2 by FDA under an Emergency Use Authorization (EUA). This EUA will remain in effect (meaning this test can be used) for the duration of the COVID-19 declaration under Section 564(b)(1) of the Act, 21 U.S.C. section 360bbb-3(b)(1), unless the authorization is terminated or revoked.  Performed at [facility], [address] [place], [place] [address]   Culture, blood (x 2)     Status: None (Preliminary result)   Collection Time: 09/26/22 12:58 PM   Specimen: BLOOD  Result Value Ref Range   Specimen Description BLOOD SITE NOT SPECIFIED    Special Requests      BOTTLES DRAWN AEROBIC AND ANAEROBIC Blood Culture adequate volume   Culture      NO GROWTH < 24 HOURS Performed at [facility], [address] [place], [place] [address]    Report Status PENDING   Lactic acid, plasma     Status: None   Collection Time: 09/26/22  3:17 PM  Result Value Ref Range   Lactic Acid, Venous 0.8 0.5 - 1.9 mmol/L    Comment: Performed at [facility], [address] [place], [place]  [address]  CBC     Status: Abnormal   Collection Time: 09/27/22  4:19 AM  Result Value Ref Range   WBC 14.5 (H) 4.0 - 10.5 K/uL   RBC 5.01 3.87 - 5.11 MIL/uL   Hemoglobin 13.6 12.0 - 15.0 g/dL   HCT 95.640.4 38.736.0 - 56.446.0 %   MCV 80.6 80.0 - 100.0 fL   MCH 27.1 26.0 - 34.0 pg   MCHC 33.7 30.0 - 36.0 g/dL   RDW 33.215.6 (H) 95.111.5 - 88.415.5 %   Platelets 553 (H) 150 - 400 K/uL   nRBC 0.0 0.0 - 0.2 %    Comment: Performed at [facility], [address] [place], [place] [address]  Protime-INR     Status: Abnormal   Collection Time: 09/27/22  4:19 AM  Result Value Ref Range   Prothrombin Time 16.4 (H) 11.4 - 15.2 seconds   INR 1.3 (H) 0.8 - 1.2    Comment: (NOTE) INR goal varies based on device and disease states. Performed at [facility], [address] [place], [place] [address]   APTT     Status: Abnormal   Collection Time: 09/27/22  4:19 AM  Result Value Ref Range   aPTT 41 (H) 24 - 36 seconds    Comment:        IF BASELINE aPTT IS ELEVATED, SUGGEST PATIENT RISK ASSESSMENT BE USED TO DETERMINE APPROPRIATE ANTICOAGULANT THERAPY. Performed at [facility], [address] [place], [place] [address]     CT ABDOMEN PELVIS W CONTRAST  Result Date: 09/26/2022 CLINICAL DATA:  Sepsis, postop, abdominal pain. Post cesarean section 09/11/2022. EXAM: CT ABDOMEN AND PELVIS WITH CONTRAST TECHNIQUE: Multidetector CT imaging of the abdomen and pelvis was performed using the standard protocol following bolus  administration of intravenous contrast. RADIATION DOSE REDUCTION: This exam was performed according to the departmental dose-optimization program which includes automated exposure control, adjustment of the mA and/or kV according to patient size and/or use of iterative reconstruction technique. CONTRAST:  13mL OMNIPAQUE IOHEXOL 350 MG/ML SOLN COMPARISON:  09/12/2022 FINDINGS: Lower chest: Lung bases are clear. Hepatobiliary: Liver, gallbladder and biliary tree are normal. Pancreas:  Normal. Spleen: Normal. Adrenals/Urinary Tract: Adrenal glands are normal. Kidneys are normal in size. Minimal prominence of the intrarenal collecting systems bilaterally. Ureters are normal. Mild bladder distention. Stomach/Bowel: Stomach and small bowel are within normal. Appendix not visualized. Colon is unremarkable. Vascular/Lymphatic: Abdominal aorta is normal in caliber. Remaining vascular structures are unremarkable. No adenopathy. Reproductive: Mild prominence of the uterus with interval decrease in size compatible patient's recent postpartum state. Small fleck of air over the endometrium and lower uterine segment which may be due to recent instrumentation versus infection/endometritis. Significant decrease in peritoneal fluid only a small amount of perihepatic fluid in the upper abdomen. Interval improvement in previously seen pelvic and peritoneal hemorrhage. No significant fluid/hemorrhage in the pelvis. There is a partially rim enhancing multiloculated fluid collection over the midline anterior mid to lower abdomen extending inferiorly to the pelvis anterior to the uterus and bladder. This collection measures approximately 4.2 x 6.2 x 10.9 cm in AP, transverse and craniocaudal dimensions. This may represent noninfected fluid versus evolving abscess/phlegmon. Other: None. Musculoskeletal: No focal abnormality. IMPRESSION: 1. Mild prominence of the uterus with interval decrease in size compatible with patient's recent postpartum state. Small fleck of air over the endometrium and lower uterine segment which may be due to recent instrumentation versus infection/endometritis. 2. Partially rim enhancing multiloculated fluid collection over the midline anterior mid to lower abdomen extending inferiorly to the pelvis anterior to the uterus and bladder. This collection measures approximately 4.2 x 6.2 x 10.9 cm in AP, transverse and craniocaudal dimensions. This is concerning for an evolving abscess/phlegmon and  less likely noninfected postsurgical fluid. 3. Significant decrease in peritoneal fluid only a small amount of perihepatic fluid in the upper abdomen. Interval improvement/resolution in previously seen pelvic and peritoneal hemorrhage. Electronically Signed   By: Elberta Fortis M.D.   On: 09/26/2022 16:40   DG Chest Port 1 View  Result Date: 09/26/2022 CLINICAL DATA:  Sepsis EXAM: PORTABLE CHEST 1 VIEW COMPARISON:  04/22/2022 and prior studies FINDINGS: The cardiomediastinal silhouette is unremarkable. There is no evidence of focal airspace disease, pulmonary edema, suspicious pulmonary nodule/mass, pleural effusion, or pneumothorax. No acute bony abnormalities are identified. IMPRESSION: No active disease. Electronically Signed   By: Harmon Pier M.D.   On: 09/26/2022 13:57    Current scheduled medications  docusate sodium  100 mg Oral Daily   lidocaine  10 mL Intradermal Once   metoprolol succinate  25 mg Oral Daily   NIFEdipine  60 mg Oral BID   prenatal multivitamin  1 tablet Oral Q1200    I have reviewed the patient's current medications.  ASSESSMENT: Principal Problem:   Intra-abdominal abscess  PLAN: Postoperative abscess vs hematoma, c/f sepsis - Concerned about intraabdominal bleeding given her abdominal exam and drain output. STAT CBC, INR, PTT, & fibrinogen ordered. Spoke with IR MD who recommended stat repeat CT. CT angio A/P ordered. Plan communicated with patient and RN - Tmax/last 4/11 0440 103.1, WC stable  - continue cefepime/flagyl  2. HFrEF, HTN - continue metoprolol, holding nifedipine iso low normal BP - strict I/Os, daily weights  Continue inpatient admission. Will  addend note with updates as they become available.  Harvie Bridge, MD Obstetrician & Gynecologist, [facility] for [employer], [facility]

## 2022-09-27 NOTE — Progress Notes (Signed)
Pharmacy Antibiotic Note  [name] is a 37 y.o. female admitted on 09/26/2022 with bacteremia.  Pharmacy has been consulted for vancomycin dosing.  Plan: Patient is still febrile on cefepime and metronidazole. Will add on vancomycin.  Initiate Vancomcyin 1250 mg IV once, then 1000 mg IV q12h. Goal trough 15-20 mcg/mL. Pharmacy will follow this patient and draw levels if continued for > 48 hours or change in patient's clinical picture/labs.  Patient will continue cefepime and metronidazole.  Height: 5\' 4"  (162.6 cm) Weight: 60.9 kg (134 lb 4.8 oz) IBW/kg (Calculated) : 54.7  Temp (24hrs), Avg:99.4 F (37.4 C), Min:97.8 F (36.6 C), Max:103.1 F (39.5 C)  Recent Labs  Lab 09/26/22 1256 09/26/22 1517 09/27/22 0419 09/27/22 1208  WBC 13.2*  --  14.5* 7.4  CREATININE 0.82  --   --   --   LATICACIDVEN 0.9 0.8  --   --     Estimated Creatinine Clearance: 81.9 mL/min (by C-G formula based on SCr of 0.82 mg/dL).    Allergies  Allergen Reactions   Other Other (See Comments)   Penicillins Rash    Antimicrobials this admission: 4/10 cefepime >>  4/10 metronidazole >>  4/11 vancomycin >>  Microbiology results: 4/10 BCx: in process   Thank you for allowing pharmacy to be a part of this patient's care.  Vonzell Schlatter Iylah Dworkin 09/27/2022 1:22 PM

## 2022-09-27 NOTE — Consult Note (Signed)
Chief Complaint: Patient was seen in consultation today for abdominal fluid collection  Referring Physician(s): Dr. Duane Lope  Supervising Physician: Roanna Banning  Patient Status: [facility] - In-pt  History of Present Illness: [name] is a 37 y.o. female with past medical history of C-section x3, mostly recently on 09/12/22 which was complicated by multiple adhesions.  Post-op her course was complicated by hemorrhage requiring blood transfusion as well as her preeclampsia.  She presented to [facility] overnight due to fever and mid abdominal pain.   CT Abomen Pelvis: 1. Mild prominence of the uterus with interval decrease in size compatible with patient's recent postpartum state. Small fleck of air over the endometrium and lower uterine segment which may be due to recent instrumentation versus infection/endometritis. 2. Partially rim enhancing multiloculated fluid collection over the midline anterior mid to lower abdomen extending inferiorly to the pelvis anterior to the uterus and bladder. This collection measures approximately 4.2 x 6.2 x 10.9 cm in AP, transverse and craniocaudal dimensions. This is concerning for an evolving abscess/phlegmon and less likely noninfected postsurgical fluid. 3. Significant decrease in peritoneal fluid only a small amount of perihepatic fluid in the upper abdomen. Interval improvement/resolution in previously seen pelvic and peritoneal hemorrhage.  IR consulted for aspiration and drainage.  Case reviewed by Dr. Milford Cage who approves patient for procedure.   Past Medical History:  Diagnosis Date   Bacterial infection    History of chicken pox    Systolic heart failure     Past Surgical History:  Procedure Laterality Date   CESAREAN SECTION     CESAREAN SECTION N/A 02/02/2015   Procedure: CESAREAN SECTION;  Surgeon: Geryl Rankins, MD;  Location: [facility];  Service: Obstetrics;  Laterality: N/A;   CESAREAN SECTION N/A 09/11/2022   Procedure: CESAREAN  SECTION;  Surgeon: Lazaro Arms, MD;  Location: [facility] ORS;  Service: Obstetrics;  Laterality: N/A;   DILATION AND CURETTAGE OF UTERUS     WISDOM TOOTH EXTRACTION      Allergies: Other and Penicillins  Medications: Prior to Admission medications   Medication Sig Start Date End Date Taking? Authorizing Provider  metoprolol succinate (TOPROL XL) 25 MG 24 hr tablet Take 1 tablet (25 mg total) by mouth daily. 09/17/22  Yes Constant, Peggy, MD  NIFEdipine (ADALAT CC) 60 MG 24 hr tablet Take 1 tablet (60 mg total) by mouth 2 (two) times daily. 09/17/22  Yes Constant, Peggy, MD  oxyCODONE (OXY IR/ROXICODONE) 5 MG immediate release tablet Take 1 tablet (5 mg total) by mouth every 4 (four) hours as needed for moderate pain. 09/17/22  Yes Constant, Peggy, MD  aspirin (ASPIRIN CHILDRENS) 81 MG chewable tablet Chew 1 tablet (81 mg total) by mouth daily. Patient not taking: Reported on 09/24/2022 03/24/22   Rasch, Victorino Dike I, NP  ferrous sulfate 325 (65 FE) MG EC tablet Take 1 tablet (325 mg total) by mouth every other day. Patient not taking: Reported on 09/05/2022 08/30/22   Milas Hock, MD  furosemide (LASIX) 20 MG tablet Take 1 tablet (20 mg total) by mouth daily. 09/17/22   Constant, Peggy, MD  ibuprofen (ADVIL) 600 MG tablet Take 1 tablet (600 mg total) by mouth every 6 (six) hours as needed for moderate pain, cramping or fever. Patient not taking: Reported on 09/24/2022 09/17/22   Constant, Peggy, MD  Prenatal MV & Min w/FA-DHA ([employer] PRENATAL GUMMY) 0.18-25 MG CHEW Chew 1 tablet by mouth daily. Patient not taking: Reported on 09/24/2022 03/24/22   Rasch, Victorino Dike  I, NP     Family History  Problem Relation Age of Onset   Hypertension Mother    Hypertension Father    Diabetes Father    Hypertension Paternal Aunt    Asthma Neg Hx    Cancer Neg Hx    Heart disease Neg Hx    Stroke Neg Hx     Social History   Socioeconomic History   Marital status: Single    Spouse name: Not on file   Number of  children: Not on file   Years of education: Not on file   Highest education level: Not on file  Occupational History   Not on file  Tobacco Use   Smoking status: Never   Smokeless tobacco: Never  Vaping Use   Vaping Use: Never used  Substance and Sexual Activity   Alcohol use: No   Drug use: No   Sexual activity: Not Currently    Birth control/protection: None  Other Topics Concern   Not on file  Social History Narrative   Not on file   Social Determinants of Health   Financial Resource Strain: Not on file  Food Insecurity: No Food Insecurity (09/26/2022)   Hunger Vital Sign    Worried About Running Out of Food in the Last Year: Never true    Ran Out of Food in the Last Year: Never true  Transportation Needs: No Transportation Needs (09/26/2022)   PRAPARE - Administrator, Civil Service (Medical): No    Lack of Transportation (Non-Medical): No  Physical Activity: Not on file  Stress: Not on file  Social Connections: Not on file     Review of Systems: A 12 point ROS discussed and pertinent positives are indicated in the HPI above.  All other systems are negative.  Review of Systems  Constitutional:  Positive for fatigue and fever.  Respiratory:  Negative for cough and shortness of breath.   Cardiovascular:  Negative for chest pain.  Gastrointestinal:  Positive for abdominal pain. Negative for nausea and vomiting.  Musculoskeletal:  Negative for back pain.  Psychiatric/Behavioral:  Negative for behavioral problems and confusion.     Vital Signs: BP 104/63 (BP Location: Left Arm)   Pulse (!) 114   Temp 98.1 F (36.7 C) (Oral)   Resp 18   Ht 5\' 4"  (1.626 m)   Wt 134 lb 4.8 oz (60.9 kg)   SpO2 98%   BMI 23.05 kg/m   Physical Exam Vitals and nursing note reviewed.  Constitutional:      General: She is not in acute distress.    Appearance: Normal appearance. She is not ill-appearing.  HENT:     Mouth/Throat:     Mouth: Mucous membranes are moist.      Pharynx: Oropharynx is clear.  Cardiovascular:     Rate and Rhythm: Normal rate and regular rhythm.  Pulmonary:     Effort: Pulmonary effort is normal. No respiratory distress.     Breath sounds: Normal breath sounds.  Abdominal:     General: Abdomen is flat.     Palpations: Abdomen is soft.  Skin:    General: Skin is warm and dry.  Neurological:     General: No focal deficit present.     Mental Status: She is alert and oriented to person, place, and time. Mental status is at baseline.  Psychiatric:        Mood and Affect: Mood normal.        Behavior: Behavior normal.  Judgment: Judgment normal.      MD Evaluation Airway: WNL Heart: WNL Abdomen: WNL Chest/ Lungs: WNL ASA  Classification: 3 Mallampati/Airway Score: Two   Imaging: CT ABDOMEN PELVIS W CONTRAST  Result Date: 09/26/2022 CLINICAL DATA:  Sepsis, postop, abdominal pain. Post cesarean section 09/11/2022. EXAM: CT ABDOMEN AND PELVIS WITH CONTRAST TECHNIQUE: Multidetector CT imaging of the abdomen and pelvis was performed using the standard protocol following bolus administration of intravenous contrast. RADIATION DOSE REDUCTION: This exam was performed according to the departmental dose-optimization program which includes automated exposure control, adjustment of the mA and/or kV according to patient size and/or use of iterative reconstruction technique. CONTRAST:  75mL OMNIPAQUE IOHEXOL 350 MG/ML SOLN COMPARISON:  09/12/2022 FINDINGS: Lower chest: Lung bases are clear. Hepatobiliary: Liver, gallbladder and biliary tree are normal. Pancreas: Normal. Spleen: Normal. Adrenals/Urinary Tract: Adrenal glands are normal. Kidneys are normal in size. Minimal prominence of the intrarenal collecting systems bilaterally. Ureters are normal. Mild bladder distention. Stomach/Bowel: Stomach and small bowel are within normal. Appendix not visualized. Colon is unremarkable. Vascular/Lymphatic: Abdominal aorta is normal in  caliber. Remaining vascular structures are unremarkable. No adenopathy. Reproductive: Mild prominence of the uterus with interval decrease in size compatible patient's recent postpartum state. Small fleck of air over the endometrium and lower uterine segment which may be due to recent instrumentation versus infection/endometritis. Significant decrease in peritoneal fluid only a small amount of perihepatic fluid in the upper abdomen. Interval improvement in previously seen pelvic and peritoneal hemorrhage. No significant fluid/hemorrhage in the pelvis. There is a partially rim enhancing multiloculated fluid collection over the midline anterior mid to lower abdomen extending inferiorly to the pelvis anterior to the uterus and bladder. This collection measures approximately 4.2 x 6.2 x 10.9 cm in AP, transverse and craniocaudal dimensions. This may represent noninfected fluid versus evolving abscess/phlegmon. Other: None. Musculoskeletal: No focal abnormality. IMPRESSION: 1. Mild prominence of the uterus with interval decrease in size compatible with patient's recent postpartum state. Small fleck of air over the endometrium and lower uterine segment which may be due to recent instrumentation versus infection/endometritis. 2. Partially rim enhancing multiloculated fluid collection over the midline anterior mid to lower abdomen extending inferiorly to the pelvis anterior to the uterus and bladder. This collection measures approximately 4.2 x 6.2 x 10.9 cm in AP, transverse and craniocaudal dimensions. This is concerning for an evolving abscess/phlegmon and less likely noninfected postsurgical fluid. 3. Significant decrease in peritoneal fluid only a small amount of perihepatic fluid in the upper abdomen. Interval improvement/resolution in previously seen pelvic and peritoneal hemorrhage. Electronically Signed   By: Elberta Fortis M.D.   On: 09/26/2022 16:40   DG Chest Port 1 View  Result Date: 09/26/2022 CLINICAL  DATA:  Sepsis EXAM: PORTABLE CHEST 1 VIEW COMPARISON:  04/22/2022 and prior studies FINDINGS: The cardiomediastinal silhouette is unremarkable. There is no evidence of focal airspace disease, pulmonary edema, suspicious pulmonary nodule/mass, pleural effusion, or pneumothorax. No acute bony abnormalities are identified. IMPRESSION: No active disease. Electronically Signed   By: Harmon Pier M.D.   On: 09/26/2022 13:57   CT ABDOMEN PELVIS WO CONTRAST  Result Date: 09/12/2022 CLINICAL DATA:  Sepsis. Status post low transverse cesarean section 09/11/2022 EXAM: CT ABDOMEN AND PELVIS WITHOUT CONTRAST TECHNIQUE: Multidetector CT imaging of the abdomen and pelvis was performed following the standard protocol without IV contrast. RADIATION DOSE REDUCTION: This exam was performed according to the departmental dose-optimization program which includes automated exposure control, adjustment of the mA and/or kV according to  patient size and/or use of iterative reconstruction technique. COMPARISON:  None Available. FINDINGS: Lower chest: There is some linear opacity at the bases likely scar or atelectasis. No pleural effusion. Patulous lower esophagus. Hepatobiliary: The top of the liver is clipped off the edge of the imaging field. On this non IV contrast exam the liver is grossly preserved. The gallbladder is mildly distended. Pancreas: Unremarkable. No pancreatic ductal dilatation or surrounding inflammatory changes. Spleen: Normal in size without focal abnormality. Adrenals/Urinary Tract: The adrenal glands are preserved. No abnormal calcifications are seen within either kidney nor along the expected course of either ureter. Bladder is contracted with a Foley catheter. Stomach/Bowel: Small amount of high density debris in the lumen of the stomach. The stomach is nondilated. Scattered colonic stool. The large bowel is nondilated. There is a normal caliber appendix in the right lower quadrant. Small bowel is nondilated. No  oral contrast. Vascular/Lymphatic: Grossly the aorta and IVC have normal course and caliber. The IVC is more collapsed than usually seen. Please correlate with patient's hematocrit. No discrete abnormal lymph node enlargement on this noncontrast examination in the abdomen and pelvis. Reproductive: Postpartum enlarged uterus measuring proximally 17.2 by 8.8 by 11.4 cm. There is some air noted along the lower anterior uterus consistent with the surgical change. Adjacent gas as well in the extraperitoneal space. Other: Complex areas of fluid are seen throughout the abdomen and pelvis, moderate. Areas extend upper along the liver and spleen as well as along the pericolic gutters and near the greater curve of the stomach. There is also areas of high density specifically in the extraperitoneal space. Components of hematoma. Musculoskeletal: Slight curvature of the spine. There is some air in the epidural space of the mid lumbar spine. This could be venous. Please correlate for any history of intervention. Anterior abdominal wall and pelvic wall soft tissue thickening, gas and skin thickening consistent with a postsurgical state. IMPRESSION: Enlarged postpartum uterus with changes noted of recent cesarean section. Moderate scattered free fluid with areas of high density consistent with areas of hemorrhage. This includes within the peritoneal space extending up along the liver, spleen and pericolic gutters as well as in the extraperitoneal space anteriorly and retroperitoneal. IVC appears collapsed. Few scattered areas of air also identified but these easily could be postsurgical. In principle infection is difficult to exclude on the basis of CT scan alone. No bowel obstruction.  Normal appendix. Critical Value/emergent results were called by telephone at the time of interpretation on 09/12/2022 at 3:20 pm to provider Dr. Cyndie Chime, who verbally acknowledged these results. Electronically Signed   By: Karen Kays M.D.   On:  09/12/2022 18:22   ECHOCARDIOGRAM LIMITED  Result Date: 09/12/2022    ECHOCARDIOGRAM LIMITED REPORT   Patient Name:   ARTHEA NOBEL Date of Exam: 09/12/2022 Medical Rec #:  [id number]        Height:       64.0 in Accession #:    [id number]       Weight:       145.9 lb Date of Birth:  [birth year]        BSA:          1.711 m Patient Age:    37 years         BP:           100/76 mmHg Patient Gender: F                HR:  104 bpm. Exam Location:  Inpatient Procedure: Limited Echo, Cardiac Doppler, Limited Color Doppler and Intracardiac            Opacification Agent Indications:    Abnormal ECG R94.31                 Cardiomyopathy-Unspecified I42.9  History:        Patient has prior history of Echocardiogram examinations, most                 recent 09/07/2022. CHF; Maternal mitral valve regurgitation                 affecting pregnancy in third trimester, antepartum.  Sonographer:    Aron Baba Referring Phys: [id number] Lazaro Arms  Sonographer Comments: Image acquisition challenging due to patient body habitus and Image acquisition challenging due to respiratory motion. IMPRESSIONS  1. Left ventricular ejection fraction, by estimation, is 30 to 35%. The left ventricle has moderately decreased function. The left ventricle demonstrates global hypokinesis. There is moderate asymmetric left ventricular hypertrophy of the inferior segment.  2. Right ventricular systolic function is normal. The right ventricular size is normal.  3. The mitral valve is normal in structure. Mild mitral valve regurgitation. No evidence of mitral stenosis.  4. The aortic valve is normal in structure. Aortic valve regurgitation is not visualized. No aortic stenosis is present.  5. The inferior vena cava is normal in size with greater than 50% respiratory variability, suggesting right atrial pressure of 3 mmHg. FINDINGS  Left Ventricle: Left ventricular ejection fraction, by estimation, is 30 to 35%. The left ventricle has moderately  decreased function. The left ventricle demonstrates global hypokinesis. Definity contrast agent was given IV to delineate the left ventricular endocardial borders. The left ventricular internal cavity size was normal in size. There is moderate asymmetric left ventricular hypertrophy of the inferior segment. Right Ventricle: The right ventricular size is normal. No increase in right ventricular wall thickness. Right ventricular systolic function is normal. Left Atrium: Left atrial size was normal in size. Right Atrium: Right atrial size was normal in size. Pericardium: There is no evidence of pericardial effusion. Mitral Valve: The mitral valve is normal in structure. Mild mitral valve regurgitation. No evidence of mitral valve stenosis. Tricuspid Valve: The tricuspid valve is normal in structure. Tricuspid valve regurgitation is not demonstrated. No evidence of tricuspid stenosis. Aortic Valve: The aortic valve is normal in structure. Aortic valve regurgitation is not visualized. No aortic stenosis is present. Pulmonic Valve: The pulmonic valve was normal in structure. Pulmonic valve regurgitation is not visualized. No evidence of pulmonic stenosis. Aorta: The aortic root is normal in size and structure. Venous: The inferior vena cava is normal in size with greater than 50% respiratory variability, suggesting right atrial pressure of 3 mmHg. IAS/Shunts: No atrial level shunt detected by color flow Doppler. Additional Comments: Spectral Doppler performed. Color Doppler performed.  LEFT VENTRICLE PLAX 2D LVIDd:         4.40 cm LVIDs:         4.30 cm LV PW:         1.70 cm LV IVS:        0.90 cm  LV Volumes (MOD) LV vol d, MOD A2C: 34.9 ml LV vol d, MOD A4C: 65.1 ml LV vol s, MOD A2C: 28.9 ml LV vol s, MOD A4C: 48.9 ml LV SV MOD A2C:     6.0 ml LV SV MOD A4C:     65.1 ml  LV SV MOD BP:      8.1 ml MITRAL VALVE MV Area (PHT): 3.89 cm MV Decel Time: 195 msec MR Peak grad: 77.4 mmHg MR Vmax:      440.00 cm/s MV E  velocity: 86.50 cm/s MV A velocity: 56.10 cm/s MV E/A ratio:  1.54 Kardie Tobb DO Electronically signed by Thomasene Ripple DO Signature Date/Time: 09/12/2022/5:25:06 PM    Final    ECHOCARDIOGRAM COMPLETE  Result Date: 09/08/2022    ECHOCARDIOGRAM REPORT   Patient Name:   NURY NEBERGALL Date of Exam: 09/07/2022 Medical Rec #:  [id number]        Height:       64.0 in Accession #:    [id number]       Weight:       151.0 lb Date of Birth:  October 10, 1985        BSA:          1.736 m Patient Age:    36 years         BP:           128/80 mmHg Patient Gender: F                HR:           93 bpm. Exam Location:  [address] Procedure: 2D Echo, 3D Echo, Cardiac Doppler, Color Doppler and Strain Analysis Indications:    I34.0 MR I50.20 CHF  History:        Patient has prior history of Echocardiogram examinations, most                 recent 08/31/2022. CHF, Mitral Valve Disease;                 Signs/Symptoms:Fatigue and dyspnea on exertion/palpitations.                 Patient is 35 weeks pregenant. Previous echo revealed LVEF 40%                 mild to moderate LVE with moderate to severe MR.  Sonographer:    Chanetta Marshall [facility], RDCS Referring Phys: [id number] HEATHER E [place] IMPRESSIONS  1. Left ventricular ejection fraction, by estimation, is 35 to 40%. The left ventricle has moderately decreased function. The left ventricle demonstrates global hypokinesis. Left ventricular diastolic parameters are consistent with Grade I diastolic dysfunction (impaired relaxation). The average left ventricular global longitudinal strain is -12.0 %. The global longitudinal strain is abnormal.  2. Right ventricular systolic function is normal. The right ventricular size is normal. There is normal pulmonary artery systolic pressure.  3. The mitral valve is normal in structure. Moderate mitral valve regurgitation. No evidence of mitral stenosis.  4. The aortic valve is normal in structure. Aortic valve regurgitation is not visualized. No  aortic stenosis is present.  5. The inferior vena cava is normal in size with greater than 50% respiratory variability, suggesting right atrial pressure of 3 mmHg. Comparison(s): A prior study was performed on 08/31/22. No significant change from prior study. FINDINGS  Left Ventricle: Left ventricular ejection fraction, by estimation, is 35 to 40%. The left ventricle has moderately decreased function. The left ventricle demonstrates global hypokinesis. The average left ventricular global longitudinal strain is -12.0 %. The global longitudinal strain is abnormal. The left ventricular internal cavity size was normal in size. There is no left ventricular hypertrophy. Left ventricular diastolic parameters are consistent with Grade I diastolic dysfunction (impaired relaxation). Right Ventricle: The right  ventricular size is normal. No increase in right ventricular wall thickness. Right ventricular systolic function is normal. There is normal pulmonary artery systolic pressure. The tricuspid regurgitant velocity is 2.11 m/s, and  with an assumed right atrial pressure of 3 mmHg, the estimated right ventricular systolic pressure is 20.8 mmHg. Left Atrium: Left atrial size was normal in size. Right Atrium: Right atrial size was normal in size. Pericardium: There is no evidence of pericardial effusion. Mitral Valve: The mitral valve is normal in structure. Moderate mitral valve regurgitation. No evidence of mitral valve stenosis. Tricuspid Valve: The tricuspid valve is normal in structure. Tricuspid valve regurgitation is trivial. No evidence of tricuspid stenosis. Aortic Valve: The aortic valve is normal in structure. Aortic valve regurgitation is not visualized. No aortic stenosis is present. Pulmonic Valve: The pulmonic valve was normal in structure. Pulmonic valve regurgitation is not visualized. No evidence of pulmonic stenosis. Aorta: The aortic root is normal in size and structure. Venous: The inferior vena cava is  normal in size with greater than 50% respiratory variability, suggesting right atrial pressure of 3 mmHg. IAS/Shunts: No atrial level shunt detected by color flow Doppler.  LEFT VENTRICLE PLAX 2D LVIDd:         5.30 cm   Diastology LVIDs:         5.00 cm   LV e' medial:    6.36 cm/s LV PW:         1.00 cm   LV E/e' medial:  17.3 LV IVS:        1.00 cm   LV e' lateral:   9.90 cm/s LVOT diam:     2.10 cm   LV E/e' lateral: 11.1 LV SV:         63 LV SV Index:   37        2D Longitudinal Strain LVOT Area:     3.46 cm  2D Strain GLS (A2C):   -11.6 %                          2D Strain GLS (A3C):   -12.2 %                          2D Strain GLS (A4C):   -12.3 %                          2D Strain GLS Avg:     -12.0 %                           3D Volume EF:                          3D EF:        38 %                          LV EDV:       200 ml                          LV ESV:       125 ml                          LV SV:  75 ml RIGHT VENTRICLE RV Basal diam:  3.00 cm RV Mid diam:    2.60 cm RV S prime:     13.40 cm/s TAPSE (M-mode): 3.1 cm RVSP:           20.8 mmHg LEFT ATRIUM             Index        RIGHT ATRIUM           Index LA diam:        4.10 cm 2.36 cm/m   RA Pressure: 3.00 mmHg LA Vol (A2C):   31.5 ml 18.14 ml/m  RA Area:     10.50 cm LA Vol (A4C):   30.7 ml 17.68 ml/m  RA Volume:   22.20 ml  12.79 ml/m LA Biplane Vol: 31.8 ml 18.32 ml/m  AORTIC VALVE LVOT Vmax:   108.00 cm/s LVOT Vmean:  68.700 cm/s LVOT VTI:    0.183 m  AORTA Ao Root diam: 2.70 cm Ao Asc diam:  3.00 cm MITRAL VALVE                  TRICUSPID VALVE MV Area (PHT): 4.21 cm       TR Peak grad:   17.8 mmHg MV Decel Time: 180 msec       TR Vmax:        211.00 cm/s MR Peak grad:    102.4 mmHg   Estimated RAP:  3.00 mmHg MR Mean grad:    69.0 mmHg    RVSP:           20.8 mmHg MR Vmax:         506.00 cm/s MR Vmean:        394.0 cm/s   SHUNTS MR PISA:         3.53 cm     Systemic VTI:  0.18 m MR PISA Eff ROA: 21 mm       Systemic Diam:  2.10 cm MR PISA Radius:  0.75 cm MV E velocity: 110.00 cm/s MV A velocity: 104.00 cm/s MV E/A ratio:  1.06 Chilton Si MD Electronically signed by Chilton Si MD Signature Date/Time: 09/08/2022/4:07:15 AM    Final    ECHOCARDIOGRAM COMPLETE  Result Date: 08/31/2022    ECHOCARDIOGRAM REPORT   Patient Name:   Katalyna Socarras Date of Exam: 08/31/2022 Medical Rec #:  [id number]        Height:       64.0 in Accession #:    [id number]       Weight:       150.0 lb Date of Birth:  06/27/1985        BSA:          1.731 m Patient Age:    36 years         BP:           128/80 mmHg Patient Gender: F                HR:           77 bpm. Exam Location:  [address] Procedure: 2D Echo, Cardiac Doppler, Color Doppler, 3D Echo and Strain Analysis Indications:    I34.0 Nonrheumatic mitral (valve) insufficiency  History:        Patient has prior history of Echocardiogram examinations, most                 recent 07/20/2022. CHF. Palpitations. Dizziness. Tachycardia. High  risk pregnancy in second trimester.  Sonographer:    Cathie BeamsAngela Gregory RCS Referring Phys: Laurance FlattenHeather Pemberton MD IMPRESSIONS  1. Left ventricular ejection fraction, by estimation, is 35 to 40%. The left ventricle has moderately decreased function. The left ventricle demonstrates global hypokinesis. The left ventricular internal cavity size was mildly to moderately dilated. There is mild left ventricular hypertrophy. Left ventricular diastolic parameters were normal. The average left ventricular global longitudinal strain is -14.0 %. The global longitudinal strain is abnormal.  2. Right ventricular systolic function is normal. The right ventricular size is normal. Tricuspid regurgitation signal is inadequate for assessing PA pressure.  3. Left atrial size was mildly dilated.  4. Moderate to severe mitral valve regurgitation.  5. The aortic valve is normal in structure. Aortic valve regurgitation is not visualized.  6. The inferior vena cava is  normal in size with greater than 50% respiratory variability, suggesting right atrial pressure of 3 mmHg. Conclusion(s)/Recommendation(s): Compared to prior echo 07/20/2022, EF has mildly declined. FINDINGS  Left Ventricle: Left ventricular ejection fraction, by estimation, is 35 to 40%. The left ventricle has moderately decreased function. The left ventricle demonstrates global hypokinesis. The average left ventricular global longitudinal strain is -14.0 %. The global longitudinal strain is abnormal. The left ventricular internal cavity size was mildly to moderately dilated. There is mild left ventricular hypertrophy. Left ventricular diastolic parameters were normal. Right Ventricle: The right ventricular size is normal. Right ventricular systolic function is normal. Tricuspid regurgitation signal is inadequate for assessing PA pressure. Left Atrium: Left atrial size was mildly dilated. Right Atrium: Right atrial size was normal in size. Pericardium: There is no evidence of pericardial effusion. Mitral Valve: Moderate to severe mitral valve regurgitation, with posteriorly-directed jet. Tricuspid Valve: Tricuspid valve regurgitation is not demonstrated. Aortic Valve: The aortic valve is normal in structure. Aortic valve regurgitation is not visualized. Pulmonic Valve: Pulmonic valve regurgitation is not visualized. Aorta: The aortic root and ascending aorta are structurally normal, with no evidence of dilitation. Venous: The inferior vena cava is normal in size with greater than 50% respiratory variability, suggesting right atrial pressure of 3 mmHg. IAS/Shunts: The interatrial septum was not well visualized.  LEFT VENTRICLE PLAX 2D LVIDd:         5.10 cm   Diastology LVIDs:         4.50 cm   LV e' medial:    17.20 cm/s LV PW:         1.50 cm   LV E/e' medial:  7.1 LV IVS:        0.90 cm   LV e' lateral:   13.60 cm/s LVOT diam:     2.00 cm   LV E/e' lateral: 9.0 LV SV:         50 LV SV Index:   29        2D  Longitudinal Strain LVOT Area:     3.14 cm  2D Strain GLS Avg:     -14.0 %                           3D Volume EF:                          3D EF:        37 %  LV EDV:       178 ml                          LV ESV:       113 ml                          LV SV:        65 ml RIGHT VENTRICLE RV Basal diam:  2.70 cm RV S prime:     12.20 cm/s TAPSE (M-mode): 2.6 cm LEFT ATRIUM             Index        RIGHT ATRIUM           Index LA diam:        3.50 cm 2.02 cm/m   RA Pressure: 3.00 mmHg LA Vol (A2C):   43.6 ml 25.18 ml/m  RA Area:     13.60 cm LA Vol (A4C):   52.6 ml 30.38 ml/m  RA Volume:   32.20 ml  18.60 ml/m LA Biplane Vol: 49.4 ml 28.53 ml/m  AORTIC VALVE LVOT Vmax:   87.00 cm/s LVOT Vmean:  58.500 cm/s LVOT VTI:    0.158 m  AORTA Ao Root diam: 2.60 cm Ao Asc diam:  2.90 cm MITRAL VALVE                  TRICUSPID VALVE MV Area (PHT): 4.26 cm       Estimated RAP:  3.00 mmHg MV Decel Time: 178 msec MR Peak grad:    119.2 mmHg   SHUNTS MR Mean grad:    70.0 mmHg    Systemic VTI:  0.16 m MR Vmax:         546.00 cm/s  Systemic Diam: 2.00 cm MR Vmean:        383.0 cm/s MR PISA:         6.28 cm MR PISA Eff ROA: 40 mm MR PISA Radius:  1.00 cm MV E velocity: 122.00 cm/s MV A velocity: 105.00 cm/s MV E/A ratio:  1.16 Photographer signed by Carolan Clines Signature Date/Time: 08/31/2022/10:22:18 AM    Final     Labs:  CBC: Recent Labs    09/15/22 0338 09/16/22 0042 09/26/22 1256 09/27/22 0419  WBC 10.2 7.7 13.2* 14.5*  HGB 9.9* 12.2 15.3* 13.6  HCT 29.3* 36.1 47.4* 40.4  PLT 148* 223 651* 553*    COAGS: Recent Labs    09/12/22 0637 09/12/22 2024 09/13/22 0253 09/27/22 0419  INR 1.1 1.1 1.0 1.3*  APTT 26  --  27 41*    BMP: Recent Labs    09/15/22 0338 09/16/22 0042 09/17/22 0427 09/26/22 1256  NA 136 136 136 131*  K 3.8 3.9 3.6 4.1  CL 103 99 98 99  CO2 24 28 27  20*  GLUCOSE 86 95 107* 118*  BUN 11 14 11 11   CALCIUM 8.5* 9.7 9.0 8.7*   CREATININE 0.59 0.71 0.67 0.82  GFRNONAA >60 >60 >60 >60    LIVER FUNCTION TESTS: Recent Labs    09/15/22 0338 09/16/22 0042 09/17/22 0427 09/26/22 1256  BILITOT 1.1 1.6* 1.8* 1.7*  AST 182* 419* 135* 22  ALT 102* 246* 182* 29  ALKPHOS 65 88 76 83  PROT 5.6* 6.4* 6.3* 7.4  ALBUMIN 2.5* 2.9* 3.0* 3.3*    TUMOR MARKERS: No results for input(s): "AFPTM", "CEA", "CA199", "CHROMGRNA"  in the last 8760 hours.  Assessment and Plan: Intra-abdominal fluid collection s/p recent C-section with adhesions.  Patient presents to [facility] with abdominal pain, fevers.  CT imaging shows multiloculated fluid collection in the mid anterior abdomen concerns for evolving abscess.  IR consulted for aspiration and drainage.  Met with patient and partner at bedside.   Discussed imaging and recommendation for aspiration and drainage.  She is agreeable to proceed.  NPO.  INR 1.3 WBC 14.5 On IV Flagyl.  She understands drain may be left in place at discharge pending clinical course and output trends.   Risks and benefits discussed with the patient including bleeding, infection, damage to adjacent structures, bowel perforation/fistula connection, and sepsis.  All of the patient's questions were answered, patient is agreeable to proceed. Consent signed and in chart.  Thank you for this interesting consult.  I greatly enjoyed meeting Davi Rotan and look forward to participating in their care.  A copy of this report was sent to the requesting provider on this date.  Electronically Signed: Hoyt Koch, PA 09/27/2022, 9:06 AM   I spent a total of 40 Minutes    in face to face in clinical consultation, greater than 50% of which was counseling/coordinating care for abdominal fluid collection.

## 2022-09-28 ENCOUNTER — Other Ambulatory Visit: Payer: Self-pay | Admitting: Radiology

## 2022-09-28 ENCOUNTER — Encounter: Payer: Self-pay | Admitting: Cardiology

## 2022-09-28 DIAGNOSIS — K651 Peritoneal abscess: Secondary | ICD-10-CM

## 2022-09-28 LAB — CBC
HCT: 38 % (ref 36.0–46.0)
Hemoglobin: 12.7 g/dL (ref 12.0–15.0)
MCH: 26.9 pg (ref 26.0–34.0)
MCHC: 33.4 g/dL (ref 30.0–36.0)
MCV: 80.5 fL (ref 80.0–100.0)
Platelets: 551 10*3/uL — ABNORMAL HIGH (ref 150–400)
RBC: 4.72 MIL/uL (ref 3.87–5.11)
RDW: 15.6 % — ABNORMAL HIGH (ref 11.5–15.5)
WBC: 10.3 10*3/uL (ref 4.0–10.5)
nRBC: 0 % (ref 0.0–0.2)

## 2022-09-28 LAB — CULTURE, BLOOD (ROUTINE X 2): Culture: NO GROWTH

## 2022-09-28 MED ORDER — NIFEDIPINE ER OSMOTIC RELEASE 30 MG PO TB24: 30.0000 mg | ORAL_TABLET | Freq: Every day | ORAL | Status: DC

## 2022-09-28 MED ORDER — PANTOPRAZOLE SODIUM 40 MG PO TBEC
40.0000 mg | DELAYED_RELEASE_TABLET | Freq: Every day | ORAL | Status: DC
  Administered 2022-09-28 – 2022-10-02 (×5): 40 mg via ORAL
  Filled 2022-09-28 (×5): qty 1

## 2022-09-28 NOTE — Discharge Instructions (Signed)
If patient is to be discharged, below are discharge instructions: - Flush each drain once daily with 5-10 cc NS flush (patient will need order for flushes upon discharge). - Record output from each drain once daily. - Change dressing every 3-5 days or sooner if needed - No showering or submerging (swimming, bathing) with drain in place. Ok to sponge bath around drain. - Follow-up at drain clinic 10-14 days after discharge for CT/possible drain injection (assess for possible drain removal)- order placed to facilitate this.  Please call IR with questions/concerns.   Call your doctor with signs and symptoms of worsening infection: -Temperature over 100.84F. -Increased redness or swelling of lower abdomen -Increased drainage of bright red blood -Increased drainage of pus -Drainage with a strong odor -Increased pain not controlled with pain medication

## 2022-09-28 NOTE — Progress Notes (Signed)
Pt complained of IV site being tender. RN assessed and no redness or swelling was noted but removal and a new IV was suggested to pt to prevent pain/further worsening. Pt stated she did not want a new IV at this time. RN educated on the importance of a good IV site as well as to let staff know if it gets worse.

## 2022-09-28 NOTE — Progress Notes (Signed)
FACULTY PRACTICE COMPREHENSIVE PROGRESS NOTE  [name] is a 37 y.o. [id number] POD17 s/p RCS and history of HFrEF, HTN who is readmitted for pelvic abscess now s/p IR drainage on 4/11  Length of Stay:  2 Days. Admitted 09/26/2022  Subjective: Feeling better this morning. Has had minimal drain output despite flushes from RN x 2 overnight and again this morning. Denies fevers/chills.   Physical Examination: CONSTITUTIONAL: Well-developed, well-nourished female in no acute distress.  NEUROLOGIC: Alert and oriented to person, place, and time.  PSYCHIATRIC: Normal mood and affect. Normal behavior. Normal judgment and thought content. CARDIOVASCULAR: Normal heart rate noted RESPIRATORY: Effort normal, no problems with respiration noted MUSCULOSKELETAL: Normal range of motion. No edema and no tenderness.  ABDOMEN: Firm, mildly distended with mild diffuse tenderness. RLQ drain in place with scant bloody output. Incision c/d/I.   Results for orders placed or performed during the hospital encounter of 09/26/22 (from the past 48 hour(s))  CBC     Status: Abnormal   Collection Time: 09/27/22  4:19 AM  Result Value Ref Range   WBC 14.5 (H) 4.0 - 10.5 K/uL   RBC 5.01 3.87 - 5.11 MIL/uL   Hemoglobin 13.6 12.0 - 15.0 g/dL   HCT 45.4 09.8 - 11.9 %   MCV 80.6 80.0 - 100.0 fL   MCH 27.1 26.0 - 34.0 pg   MCHC 33.7 30.0 - 36.0 g/dL   RDW 14.7 (H) 82.9 - 56.2 %   Platelets 553 (H) 150 - 400 K/uL   nRBC 0.0 0.0 - 0.2 %    Comment: Performed at [facility], [address] [place], [place] [address]  Protime-INR     Status: Abnormal   Collection Time: 09/27/22  4:19 AM  Result Value Ref Range   Prothrombin Time 16.4 (H) 11.4 - 15.2 seconds   INR 1.3 (H) 0.8 - 1.2    Comment: (NOTE) INR goal varies based on device and disease states. Performed at [facility], [address] [place], [place] [address]   APTT     Status: Abnormal   Collection Time: 09/27/22  4:19 AM  Result Value  Ref Range   aPTT 41 (H) 24 - 36 seconds    Comment:        IF BASELINE aPTT IS ELEVATED, SUGGEST PATIENT RISK ASSESSMENT BE USED TO DETERMINE APPROPRIATE ANTICOAGULANT THERAPY. Performed at [facility], [address] [place], [place] [address]   Aerobic/Anaerobic Culture w Gram Stain (surgical/deep wound)     Status: None (Preliminary result)   Collection Time: 09/27/22  9:30 AM   Specimen: Abscess  Result Value Ref Range   Specimen Description ABSCESS    Special Requests NONE    Gram Stain      RARE WBC PRESENT, PREDOMINANTLY PMN NO ORGANISMS SEEN    Culture      NO GROWTH < 24 HOURS Performed at [facility], [address], [place], [place] [address]    Report Status PENDING   CBC with Differential/Platelet     Status: Abnormal   Collection Time: 09/27/22 12:08 PM  Result Value Ref Range   WBC 7.4 4.0 - 10.5 K/uL   RBC 5.50 (H) 3.87 - 5.11 MIL/uL   Hemoglobin 15.0 12.0 - 15.0 g/dL   HCT 24.4 01.0 - 27.2 %   MCV 81.1 80.0 - 100.0 fL   MCH 27.3 26.0 - 34.0 pg   MCHC 33.6 30.0 - 36.0 g/dL   RDW  15.7 (H) 11.5 - 15.5 %   Platelets 565 (H) 150 - 400 K/uL   nRBC 0.0 0.0 - 0.2 %   Neutrophils Relative % 76 %   Neutro Abs 5.6 1.7 - 7.7 K/uL   Lymphocytes Relative 18 %   Lymphs Abs 1.3 0.7 - 4.0 K/uL   Monocytes Relative 6 %   Monocytes Absolute 0.4 0.1 - 1.0 K/uL   Eosinophils Relative 0 %   Eosinophils Absolute 0.0 0.0 - 0.5 K/uL   Basophils Relative 0 %   Basophils Absolute 0.0 0.0 - 0.1 K/uL   Immature Granulocytes 0 %   Abs Immature Granulocytes 0.02 0.00 - 0.07 K/uL    Comment: Performed at [facility], [address] [place], [place] [address]  Protime-INR     Status: Abnormal   Collection Time: 09/27/22 12:08 PM  Result Value Ref Range   Prothrombin Time 17.5 (H) 11.4 - 15.2 seconds   INR 1.5 (H) 0.8 - 1.2    Comment: (NOTE) INR goal varies based on device and disease states. Performed at [facility], [address],  [place], [place] [address]   APTT     Status: Abnormal   Collection Time: 09/27/22 12:08 PM  Result Value Ref Range   aPTT 40 (H) 24 - 36 seconds    Comment:        IF BASELINE aPTT IS ELEVATED, SUGGEST PATIENT RISK ASSESSMENT BE USED TO DETERMINE APPROPRIATE ANTICOAGULANT THERAPY. Performed at [facility], [address], [place], [place] [address]   Fibrinogen     Status: Abnormal   Collection Time: 09/27/22 12:08 PM  Result Value Ref Range   Fibrinogen 575 (H) 210 - 475 mg/dL    Comment: (NOTE) Fibrinogen results may be underestimated in patients receiving thrombolytic therapy. Performed at [facility], [address] [place], [place] [address]   Creatinine, serum     Status: None   Collection Time: 09/27/22 12:08 PM  Result Value Ref Range   Creatinine, Ser 0.70 0.44 - 1.00 mg/dL   GFR, Estimated >22 >44 mL/min    Comment: (NOTE) Calculated using the CKD-EPI Creatinine Equation (2021) Performed at [facility], [address] [place], [place] [address]   CBC     Status: Abnormal   Collection Time: 09/28/22  9:56 AM  Result Value Ref Range   WBC 10.3 4.0 - 10.5 K/uL   RBC 4.72 3.87 - 5.11 MIL/uL   Hemoglobin 12.7 12.0 - 15.0 g/dL   HCT 05.1 10.2 - 11.1 %   MCV 80.5 80.0 - 100.0 fL   MCH 26.9 26.0 - 34.0 pg   MCHC 33.4 30.0 - 36.0 g/dL   RDW 73.5 (H) 67.0 - 14.1 %   Platelets 551 (H) 150 - 400 K/uL   nRBC 0.0 0.0 - 0.2 %    Comment: Performed at [facility], [address], [place], [place] [address]    CT Angio Abd/Pel w/ and/or w/o  Result Date: 09/27/2022 CLINICAL DATA:  Postop hematoma or abscess suspected. Post IR drain placement. Increasing drain output. EXAM: CTA ABDOMEN AND PELVIS WITHOUT AND WITH CONTRAST TECHNIQUE: Multidetector CT imaging of the abdomen and pelvis was performed using the standard protocol during bolus administration of intravenous contrast. Multiplanar reconstructed images and MIPs were obtained and reviewed to  evaluate the vascular anatomy. RADIATION DOSE REDUCTION: This exam was performed according to the departmental dose-optimization program which includes automated exposure  control, adjustment of the mA and/or kV according to patient size and/or use of iterative reconstruction technique. CONTRAST:  36mL OMNIPAQUE IOHEXOL 350 MG/ML SOLN COMPARISON:  September 26, 2022 FINDINGS: VASCULAR Aorta: Normal caliber aorta without aneurysm, dissection, vasculitis or significant stenosis. Celiac: Patent without evidence of aneurysm, dissection, vasculitis or significant stenosis. SMA: Patent without evidence of aneurysm, dissection, vasculitis or significant stenosis. Renals: Both renal arteries are patent without evidence of aneurysm, dissection, vasculitis, fibromuscular dysplasia or significant stenosis. IMA: Patent without evidence of aneurysm, dissection, vasculitis or significant stenosis. Inflow: Patent without evidence of aneurysm, dissection, vasculitis or significant stenosis. Proximal Outflow: Bilateral common femoral and visualized portions of the superficial and profunda femoral arteries are patent without evidence of aneurysm, dissection, vasculitis or significant stenosis. Veins: No obvious venous abnormality within the limitations of this arterial phase study. Review of the MIP images confirms the above findings. NON-VASCULAR Lower chest: Bibasilar atelectasis. Hepatobiliary: No focal liver abnormality is seen. No gallstones, gallbladder wall thickening, or biliary dilatation. Pancreas: Unremarkable. No pancreatic ductal dilatation or surrounding inflammatory changes. Spleen: Normal in size without focal abnormality. Adrenals/Urinary Tract: Adrenal glands are unremarkable. Kidneys are normal, without renal calculi, focal lesion, or hydronephrosis. Bladder is unremarkable. Stomach/Bowel: Stomach is within normal limits. Appendix appears normal. No evidence of bowel wall thickening, distention, or inflammatory changes.  Lymphatic: No significant vascular findings are present. No enlarged abdominal or pelvic lymph nodes. Reproductive: The uterus is enlarged and heterogeneous with complex fluid collection anteriorly in the lower uterine segment measuring 5.1 by 1.8 by 3.0 cm. There is convex deformity of the anterior uterine wall at the site of this abnormality. Possible tract is seen extending to the retroperitoneum. This could be best appreciated on the sagittal images, 19/180, and coronal images 57/151. This may represent an area of thinning of the anterior wall of the uterus or a true tract to the peritoneum. Other: Complex thick-walled multiloculated rim enhancing collection in the anterior abdominal cavity and extending to the anterior abdominal wall has enlarged measuring 8.5 by 4.2 by 10.6 cm. Pigtail catheter in the posterior aspect of this collection. Musculoskeletal: No acute or significant osseous findings. IMPRESSION: 1. No evidence of abdominal aortic aneurysm or dissection. 2. Non-vascular. 3. Enlarged and heterogeneous uterus with complex fluid collection anteriorly in the lower uterine segment measuring 5.1 by 1.8 by 3.0 cm. There is convex deformity of the anterior uterine wall overlying this abnormality. Possible tract is seen extending to the retroperitoneum. This may represent an area of thinning of the anterior wall of the uterus or a true tract to the peritoneum. 4. Complex thick-walled multiloculated rim enhancing collection in the anterior abdominal cavity and extending to the anterior abdominal wall has enlarged measuring 8.5 by 4.2 by 10.6 cm. Electronically Signed   By: Ted Mcalpine M.D.   On: 09/27/2022 14:06   CT GUIDED VISCERAL FLUID DRAIN BY PERC CATH  Result Date: 09/27/2022 INDICATION: [id number] Postoperative abscess [id number] recent C-section with anterior pelvic fluid collection. EXAM: CT-GUIDED ANTERIOR PELVIC DRAIN PLACEMENT COMPARISON:  CT AP, 06/27/2022 MEDICATIONS: The patient is  currently admitted to the hospital and receiving intravenous antibiotics. The antibiotics were administered within an appropriate time frame prior to the initiation of the procedure. ANESTHESIA/SEDATION: Moderate (conscious) sedation was employed during this procedure. A total of Versed 1 mg and Fentanyl 50 mcg was administered intravenously. Moderate Sedation Time: 22 minutes. The patient's level of consciousness and vital signs were monitored continuously by radiology nursing throughout the procedure under my direct supervision.  CONTRAST:  None COMPLICATIONS: None immediate. PROCEDURE: RADIATION DOSE REDUCTION: This exam was performed according to the departmental dose-optimization program which includes automated exposure control, adjustment of the mA and/or kV according to patient size and/or use of iterative reconstruction technique. Informed written consent was obtained from the patient and/or patient's representative after a discussion of the risks, benefits and alternatives to treatment. The patient was placed supine on the CT gantry and a pre procedural CT was performed re-demonstrating the known abscess/fluid collection within the anterior pelvis. The procedure was planned. A timeout was performed prior to the initiation of the procedure. The anterior pelvis was prepped and draped in the usual sterile fashion. The overlying soft tissues were anesthetized with 1% lidocaine with epinephrine. Appropriate trajectory was planned with the use of a 22 gauge spinal needle. An 18 gauge trocar needle was advanced into the abscess/fluid collection and a short Amplatz super stiff wire was coiled within the collection. Appropriate positioning was confirmed with a limited CT scan. The tract was serially dilated allowing placement of a 14 Fr drainage catheter. Appropriate positioning was confirmed with a limited postprocedural CT scan. 5 ml of sanguinous fluid was aspirated. The tube was connected to a bulb suction and  sutured in place. A dressing was placed. The patient tolerated the procedure well without immediate post procedural complication. IMPRESSION: Successful CT guided placement of a 14 Fr drain catheter into the anterior pelvis fluid collection with aspiration of 5 mL of sanguinous fluid. Samples were sent to the laboratory as requested by the ordering clinical team. RECOMMENDATIONS: The patient will return to Vascular Interventional Radiology (VIR) for routine drainage catheter evaluation and exchange in 10-14 days. Roanna Banning, MD [facility] Radiology Electronically Signed   By: Roanna Banning M.D.   On: 09/27/2022 12:21    Current scheduled medications  docusate sodium  100 mg Oral Daily   lidocaine  10 mL Intradermal Once   metoprolol succinate  25 mg Oral Daily   NIFEdipine  60 mg Oral BID   pantoprazole  40 mg Oral Daily   prenatal multivitamin  1 tablet Oral Q1200   sodium chloride flush  5 mL Intracatheter Q8H    I have reviewed the patient's current medications.  ASSESSMENT: Principal Problem:   Intra-abdominal abscess  PLAN: Postoperative abscess vs hematoma, c/f sepsis - s/p RLQ drain placement 4/11, IR following - Tmax/last 4/11 0440 103.1, WC/Hgb stable. 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 Would ideally avoid surgical intervention given complexity of her CS, but will need to see how she evolves clinically.  2. HFrEF, HTN - continue metoprolol, nifedipine - strict I/Os, daily weights  Continue inpatient admission  Harvie Bridge, MD Obstetrician & Gynecologist, [facility] for [facility], [facility]

## 2022-09-28 NOTE — Progress Notes (Addendum)
Referring Physician(s): Dr.L Donavan Foil  Supervising Physician: Gilmer Mor  Patient Status:  [facility] [place] - In-pt  Chief Complaint:  Anterior pelvic abscess s/p abscess drain placement on 4.11.24 by Dr. Roanna Banning  Subjective:  Patient seen at bedside. States that the "pressure" in her pelvis is improving but present. She states that the surgical scar is "hard"  Allergies: Other and Penicillins  Medications: Prior to Admission medications   Medication Sig Start Date End Date Taking? Authorizing Provider  metoprolol succinate (TOPROL XL) 25 MG 24 hr tablet Take 1 tablet (25 mg total) by mouth daily. 09/17/22  Yes Constant, Peggy, MD  NIFEdipine (ADALAT CC) 60 MG 24 hr tablet Take 1 tablet (60 mg total) by mouth 2 (two) times daily. 09/17/22  Yes Constant, Peggy, MD  oxyCODONE (OXY IR/ROXICODONE) 5 MG immediate release tablet Take 1 tablet (5 mg total) by mouth every 4 (four) hours as needed for moderate pain. 09/17/22  Yes Constant, Peggy, MD  aspirin (ASPIRIN CHILDRENS) 81 MG chewable tablet Chew 1 tablet (81 mg total) by mouth daily. Patient not taking: Reported on 09/24/2022 03/24/22   Rasch, Victorino Dike I, NP  ferrous sulfate 325 (65 FE) MG EC tablet Take 1 tablet (325 mg total) by mouth every other day. Patient not taking: Reported on 09/05/2022 08/30/22   Milas Hock, MD  furosemide (LASIX) 20 MG tablet Take 1 tablet (20 mg total) by mouth daily. 09/17/22   Constant, Peggy, MD  ibuprofen (ADVIL) 600 MG tablet Take 1 tablet (600 mg total) by mouth every 6 (six) hours as needed for moderate pain, cramping or fever. Patient not taking: Reported on 09/24/2022 09/17/22   Constant, Peggy, MD  Prenatal MV & Min w/FA-DHA ([employer] PRENATAL GUMMY) 0.18-25 MG CHEW Chew 1 tablet by mouth daily. Patient not taking: Reported on 09/24/2022 03/24/22   Rasch, Victorino Dike I, NP     Vital Signs: BP 100/70 (BP Location: Right Arm)   Pulse (!) 102   Temp 98 F (36.7 C) (Oral)   Resp 17   Ht  (1.626 m)   Wt 143 lb 3.2  oz (65 kg)   SpO2 97%   BMI 24.58 kg/m   Physical Exam Vitals and nursing note reviewed.  Constitutional:      Appearance: She is well-developed.  HENT:     Head: Normocephalic and atraumatic.  Eyes:     Conjunctiva/sclera: Conjunctivae normal.  Pulmonary:     Effort: Pulmonary effort is normal.  Abdominal:     Comments: Positive pelvic abscess  drain to suction. Site is unremarkable with no erythema, edema, tenderness, bleeding or drainage noted at exit site. Suture and stat lock in place. Dressing is clean dry and intact. < 5  ml of  serosanguinous colored fluid noted in  bulb suction device. Drain is able to be flushed easily. No leakage or pain with flushing.  Insertion site clean and dry.     Musculoskeletal:        General: Normal range of motion.     Cervical back: Normal range of motion.  Skin:    General: Skin is warm.  Neurological:     Mental Status: She is alert and oriented to person, place, and time.     Imaging: CT Angio Abd/Pel w/ and/or w/o  Result Date: 09/27/2022 CLINICAL DATA:  Postop hematoma or abscess suspected. Post IR drain placement. Increasing drain output. EXAM: CTA ABDOMEN AND PELVIS WITHOUT AND WITH CONTRAST TECHNIQUE: Multidetector CT imaging of the abdomen and  pelvis was performed using the standard protocol during bolus administration of intravenous contrast. Multiplanar reconstructed images and MIPs were obtained and reviewed to evaluate the vascular anatomy. RADIATION DOSE REDUCTION: This exam was performed according to the departmental dose-optimization program which includes automated exposure control, adjustment of the mA and/or kV according to patient size and/or use of iterative reconstruction technique. CONTRAST:  75mL OMNIPAQUE IOHEXOL 350 MG/ML SOLN COMPARISON:  September 26, 2022 FINDINGS: VASCULAR Aorta: Normal caliber aorta without aneurysm, dissection, vasculitis or significant stenosis. Celiac: Patent without evidence of aneurysm,  dissection, vasculitis or significant stenosis. SMA: Patent without evidence of aneurysm, dissection, vasculitis or significant stenosis. Renals: Both renal arteries are patent without evidence of aneurysm, dissection, vasculitis, fibromuscular dysplasia or significant stenosis. IMA: Patent without evidence of aneurysm, dissection, vasculitis or significant stenosis. Inflow: Patent without evidence of aneurysm, dissection, vasculitis or significant stenosis. Proximal Outflow: Bilateral common femoral and visualized portions of the superficial and profunda femoral arteries are patent without evidence of aneurysm, dissection, vasculitis or significant stenosis. Veins: No obvious venous abnormality within the limitations of this arterial phase study. Review of the MIP images confirms the above findings. NON-VASCULAR Lower chest: Bibasilar atelectasis. Hepatobiliary: No focal liver abnormality is seen. No gallstones, gallbladder wall thickening, or biliary dilatation. Pancreas: Unremarkable. No pancreatic ductal dilatation or surrounding inflammatory changes. Spleen: Normal in size without focal abnormality. Adrenals/Urinary Tract: Adrenal glands are unremarkable. Kidneys are normal, without renal calculi, focal lesion, or hydronephrosis. Bladder is unremarkable. Stomach/Bowel: Stomach is within normal limits. Appendix appears normal. No evidence of bowel wall thickening, distention, or inflammatory changes. Lymphatic: No significant vascular findings are present. No enlarged abdominal or pelvic lymph nodes. Reproductive: The uterus is enlarged and heterogeneous with complex fluid collection anteriorly in the lower uterine segment measuring 5.1 by 1.8 by 3.0 cm. There is convex deformity of the anterior uterine wall at the site of this abnormality. Possible tract is seen extending to the retroperitoneum. This could be best appreciated on the sagittal images, 19/180, and coronal images 57/151. This may represent an area  of thinning of the anterior wall of the uterus or a true tract to the peritoneum. Other: Complex thick-walled multiloculated rim enhancing collection in the anterior abdominal cavity and extending to the anterior abdominal wall has enlarged measuring 8.5 by 4.2 by 10.6 cm. Pigtail catheter in the posterior aspect of this collection. Musculoskeletal: No acute or significant osseous findings. IMPRESSION: 1. No evidence of abdominal aortic aneurysm or dissection. 2. Non-vascular. 3. Enlarged and heterogeneous uterus with complex fluid collection anteriorly in the lower uterine segment measuring 5.1 by 1.8 by 3.0 cm. There is convex deformity of the anterior uterine wall overlying this abnormality. Possible tract is seen extending to the retroperitoneum. This may represent an area of thinning of the anterior wall of the uterus or a true tract to the peritoneum. 4. Complex thick-walled multiloculated rim enhancing collection in the anterior abdominal cavity and extending to the anterior abdominal wall has enlarged measuring 8.5 by 4.2 by 10.6 cm. Electronically Signed   By: Ted Mcalpine M.D.   On: 09/27/2022 14:06   CT GUIDED VISCERAL FLUID DRAIN BY PERC CATH  Result Date: 09/27/2022 INDICATION: [id number] Postoperative abscess [id number] recent C-section with anterior pelvic fluid collection. EXAM: CT-GUIDED ANTERIOR PELVIC DRAIN PLACEMENT COMPARISON:  CT AP, 06/27/2022 MEDICATIONS: The patient is currently admitted to the hospital and receiving intravenous antibiotics. The antibiotics were administered within an appropriate time frame prior to the initiation of the procedure. ANESTHESIA/SEDATION: Moderate (conscious) sedation  was employed during this procedure. A total of Versed 1 mg and Fentanyl 50 mcg was administered intravenously. Moderate Sedation Time: 22 minutes. The patient's level of consciousness and vital signs were monitored continuously by radiology nursing throughout the procedure under my direct  supervision. CONTRAST:  None COMPLICATIONS: None immediate. PROCEDURE: RADIATION DOSE REDUCTION: This exam was performed according to the departmental dose-optimization program which includes automated exposure control, adjustment of the mA and/or kV according to patient size and/or use of iterative reconstruction technique. Informed written consent was obtained from the patient and/or patient's representative after a discussion of the risks, benefits and alternatives to treatment. The patient was placed supine on the CT gantry and a pre procedural CT was performed re-demonstrating the known abscess/fluid collection within the anterior pelvis. The procedure was planned. A timeout was performed prior to the initiation of the procedure. The anterior pelvis was prepped and draped in the usual sterile fashion. The overlying soft tissues were anesthetized with 1% lidocaine with epinephrine. Appropriate trajectory was planned with the use of a 22 gauge spinal needle. An 18 gauge trocar needle was advanced into the abscess/fluid collection and a short Amplatz super stiff wire was coiled within the collection. Appropriate positioning was confirmed with a limited CT scan. The tract was serially dilated allowing placement of a 14 Fr drainage catheter. Appropriate positioning was confirmed with a limited postprocedural CT scan. 5 ml of sanguinous fluid was aspirated. The tube was connected to a bulb suction and sutured in place. A dressing was placed. The patient tolerated the procedure well without immediate post procedural complication. IMPRESSION: Successful CT guided placement of a 14 Fr drain catheter into the anterior pelvis fluid collection with aspiration of 5 mL of sanguinous fluid. Samples were sent to the laboratory as requested by the ordering clinical team. RECOMMENDATIONS: The patient will return to [facility] ([facility]IR) for routine drainage catheter evaluation and exchange in 10-14 days. Roanna Banning, MD [facility] Electronically Signed   By: Roanna Banning M.D.   On: 09/27/2022 12:21   CT ABDOMEN PELVIS W CONTRAST  Result Date: 09/26/2022 CLINICAL DATA:  Sepsis, postop, abdominal pain. Post cesarean section 09/11/2022. EXAM: CT ABDOMEN AND PELVIS WITH CONTRAST TECHNIQUE: Multidetector CT imaging of the abdomen and pelvis was performed using the standard protocol following bolus administration of intravenous contrast. RADIATION DOSE REDUCTION: This exam was performed according to the departmental dose-optimization program which includes automated exposure control, adjustment of the mA and/or kV according to patient size and/or use of iterative reconstruction technique. CONTRAST:  75mL OMNIPAQUE IOHEXOL 350 MG/ML SOLN COMPARISON:  09/12/2022 FINDINGS: Lower chest: Lung bases are clear. Hepatobiliary: Liver, gallbladder and biliary tree are normal. Pancreas: Normal. Spleen: Normal. Adrenals/Urinary Tract: Adrenal glands are normal. Kidneys are normal in size. Minimal prominence of the intrarenal collecting systems bilaterally. Ureters are normal. Mild bladder distention. Stomach/Bowel: Stomach and small bowel are within normal. Appendix not visualized. Colon is unremarkable. Vascular/Lymphatic: Abdominal aorta is normal in caliber. Remaining vascular structures are unremarkable. No adenopathy. Reproductive: Mild prominence of the uterus with interval decrease in size compatible patient's recent postpartum state. Small fleck of air over the endometrium and lower uterine segment which may be due to recent instrumentation versus infection/endometritis. Significant decrease in peritoneal fluid only a small amount of perihepatic fluid in the upper abdomen. Interval improvement in previously seen pelvic and peritoneal hemorrhage. No significant fluid/hemorrhage in the pelvis. There is a partially rim enhancing multiloculated fluid collection over  the  midline anterior mid to lower abdomen extending inferiorly to the pelvis anterior to the uterus and bladder. This collection measures approximately 4.2 x 6.2 x 10.9 cm in AP, transverse and craniocaudal dimensions. This may represent noninfected fluid versus evolving abscess/phlegmon. Other: None. Musculoskeletal: No focal abnormality. IMPRESSION: 1. Mild prominence of the uterus with interval decrease in size compatible with patient's recent postpartum state. Small fleck of air over the endometrium and lower uterine segment which may be due to recent instrumentation versus infection/endometritis. 2. Partially rim enhancing multiloculated fluid collection over the midline anterior mid to lower abdomen extending inferiorly to the pelvis anterior to the uterus and bladder. This collection measures approximately 4.2 x 6.2 x 10.9 cm in AP, transverse and craniocaudal dimensions. This is concerning for an evolving abscess/phlegmon and less likely noninfected postsurgical fluid. 3. Significant decrease in peritoneal fluid only a small amount of perihepatic fluid in the upper abdomen. Interval improvement/resolution in previously seen pelvic and peritoneal hemorrhage. Electronically Signed   By: Elberta Fortis M.D.   On: 09/26/2022 16:40   DG Chest Port 1 View  Result Date: 09/26/2022 CLINICAL DATA:  Sepsis EXAM: PORTABLE CHEST 1 VIEW COMPARISON:  04/22/2022 and prior studies FINDINGS: The cardiomediastinal silhouette is unremarkable. There is no evidence of focal airspace disease, pulmonary edema, suspicious pulmonary nodule/mass, pleural effusion, or pneumothorax. No acute bony abnormalities are identified. IMPRESSION: No active disease. Electronically Signed   By: Harmon Pier M.D.   On: 09/26/2022 13:57    Labs:  CBC: Recent Labs    09/26/22 1256 09/27/22 0419 09/27/22 1208 09/28/22 0956  WBC 13.2* 14.5* 7.4 10.3  HGB 15.3* 13.6 15.0 12.7  HCT 47.4* 40.4 44.6 38.0  PLT 651* 553* 565* 551*     COAGS: Recent Labs    09/12/22 0637 09/12/22 2024 09/13/22 0253 09/27/22 0419 09/27/22 1208  INR 1.1 1.1 1.0 1.3* 1.5*  APTT 26  --  27 41* 40*    BMP: Recent Labs    09/15/22 0338 09/16/22 0042 09/17/22 0427 09/26/22 1256 09/27/22 1208  NA 136 136 136 131*  --   K 3.8 3.9 3.6 4.1  --   CL 103 99 98 99  --   CO2 24 28 27  20*  --   GLUCOSE 86 95 107* 118*  --   BUN 11 14 11 11   --   CALCIUM 8.5* 9.7 9.0 8.7*  --   CREATININE 0.59 0.71 0.67 0.82 0.70  GFRNONAA >60 >60 >60 >60 >60    LIVER FUNCTION TESTS: Recent Labs    09/15/22 0338 09/16/22 0042 09/17/22 0427 09/26/22 1256  BILITOT 1.1 1.6* 1.8* 1.7*  AST 182* 419* 135* 22  ALT 102* 246* 182* 29  ALKPHOS 65 88 76 83  PROT 5.6* 6.4* 6.3* 7.4  ALBUMIN 2.5* 2.9* 3.0* 3.3*    Assessment and Plan:  37 y.o. female inpatient. History of c-section X 3. Most recently on 3.27.24. complicated by adhesions. Hospital stay complicated by hemorrhage requiring blood transfusion and pre-eclampsia. Found to have intra abdominal abscess. IR placed to the anterior fluid collection.   Drain Location: anterior pelvic abscess Size: Fr size: 14 Fr Date of placement: 4.11.24  Currently to: Drain collection device: suction bulb 24 hour output:  Output by Drain (mL) 09/26/22 0701 - 09/26/22 1900 09/26/22 1901 - 09/27/22 0700 09/27/22 0701 - 09/27/22 1900 09/27/22 1901 - 09/28/22 0700 09/28/22 0701 - 09/28/22 1627  Closed System Drain 1 Right;Anterior RLQ Bulb (JP) 14 Fr.  175 34 5   WBC is 10.3. Cultures show no growth. Afebrile last temp.   Interval imaging/drain manipulation:  CT abd pelvis from 4.11.24 shows the drain in good position.  Current examination: Flushes/aspirates easily.  Insertion site unremarkable. Suture and stat lock in place. Dressed appropriately.   Plan: Continue TID flushes with 5 cc NS. Record output Q shift. Dressing changes QD or PRN if soiled.  Call IR APP or on call IR MD if difficulty  flushing or sudden change in drain output.  Repeat imaging/possible drain injection once output < 10 mL/QD (excluding flush material). Consideration for drain removal if output is < 10 mL/QD (excluding flush material), pending discussion with the providing surgical service.  Discharge planning: Please contact IR APP or on call IR MD prior to patient d/c to ensure appropriate follow up plans are in place. Typically patient will follow up with IR clinic 10-14 days post d/c for repeat imaging/possible drain injection. IR scheduler will contact patient with date/time of appointment. Patient will need to flush drain QD with 5 cc NS, record output QD, dressing changes every 2-3 days or earlier if soiled.   IR will continue to follow - please call with questions or concerns.   Electronically Signed: Alene Mires, NP 09/28/2022, 4:25 PM   I spent a total of 15 Minutes at the patient's bedside AND on the patient's hospital floor or unit, greater than 50% of which was counseling/coordinating care for anterior pelvic abscess drain placement.

## 2022-09-29 LAB — CBC
HCT: 37.4 % (ref 36.0–46.0)
Hemoglobin: 12.1 g/dL (ref 12.0–15.0)
MCH: 26.7 pg (ref 26.0–34.0)
MCHC: 32.4 g/dL (ref 30.0–36.0)
MCV: 82.6 fL (ref 80.0–100.0)
Platelets: 525 10*3/uL — ABNORMAL HIGH (ref 150–400)
RBC: 4.53 MIL/uL (ref 3.87–5.11)
RDW: 16 % — ABNORMAL HIGH (ref 11.5–15.5)
WBC: 9.6 10*3/uL (ref 4.0–10.5)
nRBC: 0 % (ref 0.0–0.2)

## 2022-09-29 LAB — AEROBIC/ANAEROBIC CULTURE W GRAM STAIN (SURGICAL/DEEP WOUND)

## 2022-09-29 NOTE — Progress Notes (Signed)
FACULTY PRACTICE COMPREHENSIVE PROGRESS NOTE  [name] is a 37 y.o. [id number] POD18 s/p RCS c/b PPH and history of HFrEF, HTN who is readmitted for pelvic abscess now s/p IR drainage on 4/11  Length of Stay:  3 Days. Admitted 09/26/2022  Subjective: Febrile to 100.6 overnight, repeat temp 50 minutes later normal. Drain output 20cc in pas 24h  Feels about the same, maybe a little bit better than yesterday. Pain currently 4/10 after taking tylenol & oxy . Tolerating regular diet. Ambulating.   BP 109/76 (BP Location: Right Arm)   Pulse 86   Temp 97.9 F (36.6 C) (Oral)   Resp 20   Ht  (1.626 m)   Wt 61.6 kg   SpO2 98%   BMI 23.31 kg/m  Physical Examination: CONSTITUTIONAL: Well-developed, well-nourished female in no acute distress. A&Ox3 CARDIOVASCULAR: Normal heart rate noted RESPIRATORY: Effort normal, no problems with respiration noted MUSCULOSKELETAL: Normal range of motion. No edema and no tenderness.  ABDOMEN: Upper abdomen soft and non-tender. Abdomen below the umbilicus is firm and mildly tender, improved from prior exams. Induration around CS incision but intact with no overlying erythema or warmth. RLQ drain in place with small amount of bloody & purulent appearing fluid.   Results for orders placed or performed during the hospital encounter of 09/26/22 (from the past 48 hour(s))  CBC with Differential/Platelet     Status: Abnormal   Collection Time: 09/27/22 12:08 PM  Result Value Ref Range   WBC 7.4 4.0 - 10.5 K/uL   RBC 5.50 (H) 3.87 - 5.11 MIL/uL   Hemoglobin 15.0 12.0 - 15.0 g/dL   HCT 14.7 82.9 - 56.2 %   MCV 81.1 80.0 - 100.0 fL   MCH 27.3 26.0 - 34.0 pg   MCHC 33.6 30.0 - 36.0 g/dL   RDW 13.0 (H) 86.5 - 78.4 %   Platelets 565 (H) 150 - 400 K/uL   nRBC 0.0 0.0 - 0.2 %   Neutrophils Relative % 76 %   Neutro Abs 5.6 1.7 - 7.7 K/uL   Lymphocytes Relative 18 %   Lymphs Abs 1.3 0.7 - 4.0 K/uL   Monocytes Relative 6 %   Monocytes Absolute 0.4 0.1  - 1.0 K/uL   Eosinophils Relative 0 %   Eosinophils Absolute 0.0 0.0 - 0.5 K/uL   Basophils Relative 0 %   Basophils Absolute 0.0 0.0 - 0.1 K/uL   Immature Granulocytes 0 %   Abs Immature Granulocytes 0.02 0.00 - 0.07 K/uL    Comment: Performed at [facility], [address] [place], [place] [address]  Protime-INR     Status: Abnormal   Collection Time: 09/27/22 12:08 PM  Result Value Ref Range   Prothrombin Time 17.5 (H) 11.4 - 15.2 seconds   INR 1.5 (H) 0.8 - 1.2    Comment: (NOTE) INR goal varies based on device and disease states. Performed at [facility], [address] [place], [place] [address]   APTT     Status: Abnormal   Collection Time: 09/27/22 12:08 PM  Result Value Ref Range   aPTT 40 (H) 24 - 36 seconds    Comment:        IF BASELINE aPTT IS ELEVATED, SUGGEST PATIENT RISK ASSESSMENT BE USED TO DETERMINE APPROPRIATE ANTICOAGULANT THERAPY. Performed at [facility], [address] [place], [place] [address]   Fibrinogen     Status: Abnormal   Collection Time: 09/27/22 12:08 PM  Result Value  Ref Range   Fibrinogen 575 (H) 210 - 475 mg/dL    Comment: (NOTE) Fibrinogen results may be underestimated in patients receiving thrombolytic therapy. Performed at [facility], [address], [place], [place] [address]   Creatinine, serum     Status: None   Collection Time: 09/27/22 12:08 PM  Result Value Ref Range   Creatinine, Ser 0.70 0.44 - 1.00 mg/dL   GFR, Estimated >28 >76 mL/min    Comment: (NOTE) Calculated using the CKD-EPI Creatinine Equation (2021) Performed at [facility], [address] [place], [place] [address]   CBC     Status: Abnormal   Collection Time: 09/28/22  9:56 AM  Result Value Ref Range   WBC 10.3 4.0 - 10.5 K/uL   RBC 4.72 3.87 - 5.11 MIL/uL   Hemoglobin 12.7 12.0 - 15.0 g/dL   HCT 26.2 03.5 - 59.7 %   MCV 80.5 80.0 - 100.0 fL   MCH 26.9 26.0 - 34.0 pg   MCHC 33.4 30.0 - 36.0 g/dL   RDW 41.6  (H) 38.4 - 15.5 %   Platelets 551 (H) 150 - 400 K/uL   nRBC 0.0 0.0 - 0.2 %    Comment: Performed at [facility], [address] [place], [place] [address]  CBC     Status: Abnormal   Collection Time: 09/29/22  9:03 AM  Result Value Ref Range   WBC 9.6 4.0 - 10.5 K/uL   RBC 4.53 3.87 - 5.11 MIL/uL   Hemoglobin 12.1 12.0 - 15.0 g/dL   HCT 80.3 21.2 - 24.8 %   MCV 82.6 80.0 - 100.0 fL   MCH 26.7 26.0 - 34.0 pg   MCHC 32.4 30.0 - 36.0 g/dL   RDW 25.0 (H) 03.7 - 04.8 %   Platelets 525 (H) 150 - 400 K/uL   nRBC 0.0 0.0 - 0.2 %    Comment: Performed at [facility], [address], [place], [place] [address]    CT Angio Abd/Pel w/ and/or w/o  Result Date: 09/27/2022 CLINICAL DATA:  Postop hematoma or abscess suspected. Post IR drain placement. Increasing drain output. EXAM: CTA ABDOMEN AND PELVIS WITHOUT AND WITH CONTRAST TECHNIQUE: Multidetector CT imaging of the abdomen and pelvis was performed using the standard protocol during bolus administration of intravenous contrast. Multiplanar reconstructed images and MIPs were obtained and reviewed to evaluate the vascular anatomy. RADIATION DOSE REDUCTION: This exam was performed according to the departmental dose-optimization program which includes automated exposure control, adjustment of the mA and/or kV according to patient size and/or use of iterative reconstruction technique. CONTRAST:  75mL OMNIPAQUE IOHEXOL 350 MG/ML SOLN COMPARISON:  September 26, 2022 FINDINGS: VASCULAR Aorta: Normal caliber aorta without aneurysm, dissection, vasculitis or significant stenosis. Celiac: Patent without evidence of aneurysm, dissection, vasculitis or significant stenosis. SMA: Patent without evidence of aneurysm, dissection, vasculitis or significant stenosis. Renals: Both renal arteries are patent without evidence of aneurysm, dissection, vasculitis, fibromuscular dysplasia or significant stenosis. IMA: Patent without evidence of aneurysm, dissection,  vasculitis or significant stenosis. Inflow: Patent without evidence of aneurysm, dissection, vasculitis or significant stenosis. Proximal Outflow: Bilateral common femoral and visualized portions of the superficial and profunda femoral arteries are patent without evidence of aneurysm, dissection, vasculitis or significant stenosis. Veins: No obvious venous abnormality within the limitations of this arterial phase study. Review of the MIP images confirms the above findings. NON-VASCULAR Lower chest: Bibasilar atelectasis. Hepatobiliary: No focal liver abnormality is seen. No gallstones,  gallbladder wall thickening, or biliary dilatation. Pancreas: Unremarkable. No pancreatic ductal dilatation or surrounding inflammatory changes. Spleen: Normal in size without focal abnormality. Adrenals/Urinary Tract: Adrenal glands are unremarkable. Kidneys are normal, without renal calculi, focal lesion, or hydronephrosis. Bladder is unremarkable. Stomach/Bowel: Stomach is within normal limits. Appendix appears normal. No evidence of bowel wall thickening, distention, or inflammatory changes. Lymphatic: No significant vascular findings are present. No enlarged abdominal or pelvic lymph nodes. Reproductive: The uterus is enlarged and heterogeneous with complex fluid collection anteriorly in the lower uterine segment measuring 5.1 by 1.8 by 3.0 cm. There is convex deformity of the anterior uterine wall at the site of this abnormality. Possible tract is seen extending to the retroperitoneum. This could be best appreciated on the sagittal images, 19/180, and coronal images 57/151. This may represent an area of thinning of the anterior wall of the uterus or a true tract to the peritoneum. Other: Complex thick-walled multiloculated rim enhancing collection in the anterior abdominal cavity and extending to the anterior abdominal wall has enlarged measuring 8.5 by 4.2 by 10.6 cm. Pigtail catheter in the posterior aspect of this collection.  Musculoskeletal: No acute or significant osseous findings. IMPRESSION: 1. No evidence of abdominal aortic aneurysm or dissection. 2. Non-vascular. 3. Enlarged and heterogeneous uterus with complex fluid collection anteriorly in the lower uterine segment measuring 5.1 by 1.8 by 3.0 cm. There is convex deformity of the anterior uterine wall overlying this abnormality. Possible tract is seen extending to the retroperitoneum. This may represent an area of thinning of the anterior wall of the uterus or a true tract to the peritoneum. 4. Complex thick-walled multiloculated rim enhancing collection in the anterior abdominal cavity and extending to the anterior abdominal wall has enlarged measuring 8.5 by 4.2 by 10.6 cm. Electronically Signed   By: Ted Mcalpine M.D.   On: 09/27/2022 14:06    Current scheduled medications  docusate sodium  100 mg Oral Daily   lidocaine  10 mL Intradermal Once   metoprolol succinate  25 mg Oral Daily   NIFEdipine  30 mg Oral Daily   pantoprazole  40 mg Oral Daily   prenatal multivitamin  1 tablet Oral Q1200   sodium chloride flush  5 mL Intracatheter Q8H    I have reviewed the patient's current medications.  ASSESSMENT: Principal Problem:   Intra-abdominal abscess  PLAN: Postoperative abscess vs hematoma, c/f sepsis - improving - s/p RLQ drain placement 4/11, IR following.   -- plan to repeat imaging when daily output is < 49mL/d vs outpatient IR follow up in 10-14 days post discharge - Tlast 4/12 2059 100.6. WC & Hgb remain stable - continue cefepime/flagyl & vanc (added 4/11)  -- plan to transition to po abx when afebrile x 24-48h (hopefully tomorrow AM)  -- has PCN allergy of rash and swelling. Reviewed abx recommendations with pharmacy given her drain & blood cultures have been no growth. Recommend test dose of Augmentin 500mg  then watch for 1 hour. If she tolertaes PO regimen would be augmentin/doxy - It appears that there may be a dehiscence of her  hysterotomy. Would ideally avoid surgical intervention given complexity of her CS, but will need to see how she evolves clinically.  2. HFrEF, HTN - continue metoprolol - discontinued nifedipine iso low normal Bps. Will resume at 30mg  daily if needed - strict I/Os, daily weights; has gained 1kg since admission no e/o fluid overload on exam  Ppx: SCDs, ambulation. If Hgb stable tomorrow, plan to resume pLov  Continue inpatient admission  Harvie Bridge, MD Obstetrician & Gynecologist, [facility] for [facility], [facility]

## 2022-09-30 ENCOUNTER — Encounter (HOSPITAL_COMMUNITY): Payer: Self-pay | Admitting: Family Medicine

## 2022-09-30 LAB — CBC
HCT: 39.5 % (ref 36.0–46.0)
Hemoglobin: 12.8 g/dL (ref 12.0–15.0)
MCH: 26.6 pg (ref 26.0–34.0)
MCHC: 32.4 g/dL (ref 30.0–36.0)
MCV: 82.1 fL (ref 80.0–100.0)
Platelets: 560 10*3/uL — ABNORMAL HIGH (ref 150–400)
RBC: 4.81 MIL/uL (ref 3.87–5.11)
RDW: 16.2 % — ABNORMAL HIGH (ref 11.5–15.5)
WBC: 10.2 10*3/uL (ref 4.0–10.5)
nRBC: 0 % (ref 0.0–0.2)

## 2022-09-30 LAB — CULTURE, BLOOD (ROUTINE X 2): Culture: NO GROWTH

## 2022-09-30 LAB — AEROBIC/ANAEROBIC CULTURE W GRAM STAIN (SURGICAL/DEEP WOUND)

## 2022-09-30 MED ORDER — COMPLETENATE 29-1 MG PO CHEW
1.0000 | CHEWABLE_TABLET | Freq: Every day | ORAL | Status: DC
  Administered 2022-09-30 – 2022-10-01 (×2): 1 via ORAL
  Filled 2022-09-30 (×2): qty 1

## 2022-09-30 MED ORDER — ACYCLOVIR 5 % EX OINT
TOPICAL_OINTMENT | Freq: Every day | CUTANEOUS | Status: DC
  Administered 2022-09-30 – 2022-10-01 (×2): 1 via TOPICAL
  Filled 2022-09-30: qty 5
  Filled 2022-09-30: qty 15

## 2022-09-30 NOTE — Progress Notes (Signed)
Patient ID: Derion Malach, female   DOB: [birth year], 37 y.o.   MRN: [id number]   * No surgery found *  Zuhra Lingley is a 37 y.o. [id number] POD#18 repeat C section with postoperative intraperitoneal hemorrhage and subsequent hematoma/abscess    Past Medical History:  Diagnosis Date   Bacterial infection    History of chicken pox    Systolic heart failure     No past surgical history pertinent negatives on file.  Scheduled Meds:  docusate sodium  100 mg Oral Daily   lidocaine  10 mL Intradermal Once   metoprolol succinate  25 mg Oral Daily   pantoprazole  40 mg Oral Daily   prenatal multivitamin  1 tablet Oral Q1200   sodium chloride flush  5 mL Intracatheter Q8H    Continuous Infusions:  ceFEPime (MAXIPIME) IV 2 g (09/30/22 0618)   metronidazole 500 mg (09/30/22 0031)   vancomycin 1,000 mg (09/30/22 0236)    PRN Meds:acetaminophen, calcium carbonate, HYDROmorphone, ondansetron, oxyCODONE, oxyCODONE  Allergies  Allergen Reactions   Other Other (See Comments)   Penicillins Rash    Principal Problem:   Intra-abdominal abscess   Subjective   Pt feels better today, first day she can really say that No emesis minimal nausea No BM yesterday  Objective   Vitals:   09/29/22 1654 09/29/22 1954 09/29/22 2319 09/30/22 0530  BP: 116/79 (!) 129/90 (!) 126/91 (!) 131/90  Pulse: 82 87 90 97  Resp: 18 16 16 16   Temp: 98 F (36.7 C) 98.8 F (37.1 C) 98.5 F (36.9 C) 99.9 F (37.7 C)  TempSrc: Oral Oral Oral Oral  SpO2: 99% 100% 100% 100%  Weight:      Height:       Vitals:   09/28/22 0806 09/28/22 1237 09/28/22 1711 09/28/22 1949  BP: 97/60 100/70 113/76 119/73   09/28/22 2354 09/29/22 0427 09/29/22 0823 09/29/22 1227  BP: 106/75 101/64 109/76 103/70   09/29/22 1654 09/29/22 1954 09/29/22 2319 09/30/22 0530  BP: 116/79 (!) 129/90 (!) 126/91 (!) 131/90    Drain output 20cc in 24 hours milky bloody  Subjective Objective: Vital signs (most recent): Blood  pressure (!) 131/90, pulse 97, temperature 99.9 F (37.7 C), temperature source Oral, resp. rate 16, height 5\' 4"  (1.626 m), weight 61.6 kg, SpO2 100 %, not currently breastfeeding.   Gen  WDWN NAD Abdomen  dressing for drain in place, mild tenderness, pt states it is improved Incision  clean dry intact     Latest Ref Rng & Units 09/30/2022    5:16 AM 09/29/2022    9:03 AM 09/28/2022    9:56 AM  CBC  WBC 4.0 - 10.5 K/uL 10.2  9.6  10.3   Hemoglobin 12.0 - 15.0 g/dL 62.2  63.3  35.4   Hematocrit 36.0 - 46.0 % 39.5  37.4  38.0   Platelets 150 - 400 K/uL 560  525  551        Latest Ref Rng & Units 09/27/2022   12:08 PM 09/26/2022   12:56 PM 09/17/2022    4:27 AM  CMP  Glucose 70 - 99 mg/dL  562  563   BUN 6 - 20 mg/dL  11  11   Creatinine 8.93 - 1.00 mg/dL 7.34  2.87  6.81   Sodium 135 - 145 mmol/L  131  136   Potassium 3.5 - 5.1 mmol/L  4.1  3.6   Chloride 98 - 111 mmol/L  99  98  CO2 22 - 32 mmol/L  20  27   Calcium 8.9 - 10.3 mg/dL  8.7  9.0   Total Protein 6.5 - 8.1 g/dL  7.4  6.3   Total Bilirubin 0.3 - 1.2 mg/dL  1.7  1.8   Alkaline Phos 38 - 126 U/L  83  76   AST 15 - 41 U/L  22  135   ALT 0 - 44 U/L  29  182      Assessment & Plan Principal Problem:   Intra-abdominal abscess   PLAN: Postoperative abscess vs hematoma, c/f sepsis - improving WBC stable  Sensitivities still pending Discussed with patient if she needs to have surgical management would recommend supracervical hysterectomy, she understands  - s/p RLQ drain placement 4/11, IR following.              -- plan to repeat imaging when daily output is < 57mL/d vs outpatient IR follow up in 10-14 days post discharge - Tlast 4/12 2059 100.6. WC & Hgb remain stable - continue cefepime/flagyl & vanc (added 4/11)             -- plan to transition to po abx when afebrile x 24-48h (hopefully tomorrow AM)             -- has PCN allergy of rash and swelling. Reviewed abx recommendations with pharmacy given her drain &  blood cultures have been no growth. Recommend test dose of Augmentin 500mg  then watch for 1 hour. If she tolertaes PO regimen would be augmentin/doxy - It appears that there may be a dehiscence of her hysterotomy. Would ideally avoid surgical intervention given complexity of her CS, but will need to see how she evolves clinically.   2. HFrEF, HTN - continue metoprolol - discontinued nifedipine iso low normal Bps. Will resume at 30mg  daily if needed - strict I/Os, daily weights; has gained 1kg since admission no e/o fluid overload on exam   Ppx: SCDs, ambulation. If Hgb stable tomorrow, plan to resume pLov   Continue inpatient admission  Lazaro Arms, MD 09/30/2022, 7:33 AM

## 2022-09-30 NOTE — Progress Notes (Signed)
Referring Physician(s): Dr.L Donavan Foil  Supervising Physician: Gilmer Mor  Patient Status:  [facility] - In-pt  Chief Complaint:  Anterior pelvic abscess s/p abscess drain placement on 4.11.24 by Dr. Roanna Banning  Subjective:  Feeling better today  Allergies: Other and Penicillins  Medications:  Current Facility-Administered Medications:    acetaminophen (TYLENOL) tablet 650 mg, 650 mg, Oral, Q4H PRN, Venora Maples, MD, 650 mg at 09/30/22 0818   acyclovir ointment (ZOVIRAX) 5 %, , Topical, 5 X Daily, Harvie Bridge C, MD   calcium carbonate (TUMS - dosed in mg elemental calcium) chewable tablet 400 mg of elemental calcium, 2 tablet, Oral, Q4H PRN, Venora Maples, MD, 400 mg of elemental calcium at 09/28/22 1313   ceFEPIme (MAXIPIME) 2 g in sodium chloride 0.9 % 100 mL IVPB, 2 g, Intravenous, Q8H, Venora Maples, MD, Last Rate: 200 mL/hr at 09/30/22 0618, 2 g at 09/30/22 0618   docusate sodium (COLACE) capsule 100 mg, 100 mg, Oral, Daily, Venora Maples, MD, 100 mg at 09/30/22 1610   HYDROmorphone (DILAUDID) injection 0.5 mg, 0.5 mg, Intravenous, Q2H PRN, Lennart Pall, MD   lidocaine (XYLOCAINE) 1 % (with pres) injection 10 mL, 10 mL, Intradermal, Once, Mugweru, Jon, MD   metoprolol succinate (TOPROL-XL) 24 hr tablet 25 mg, 25 mg, Oral, Daily, Venora Maples, MD, 25 mg at 09/30/22 9604   metroNIDAZOLE (FLAGYL) IVPB 500 mg, 500 mg, Intravenous, Q8H, Venora Maples, MD, Last Rate: 100 mL/hr at 09/30/22 0802, 500 mg at 09/30/22 0802   ondansetron (ZOFRAN-ODT) disintegrating tablet 4 mg, 4 mg, Oral, Q8H PRN, Constant, Peggy, MD, 4 mg at 09/29/22 1637   oxyCODONE (Oxy IR/ROXICODONE) immediate release tablet 10 mg, 10 mg, Oral, Q4H PRN, Harvie Bridge C, MD, 10 mg at 09/30/22 0818   oxyCODONE (Oxy IR/ROXICODONE) immediate release tablet 5 mg, 5 mg, Oral, Q4H PRN, Harvie Bridge C, MD, 5 mg at 09/29/22 0426   pantoprazole (PROTONIX) EC tablet 40 mg, 40  mg, Oral, Daily, Constant, Peggy, MD, 40 mg at 09/30/22 5409   prenatal multivitamin tablet 1 tablet, 1 tablet, Oral, Q1200, Venora Maples, MD, 1 tablet at 09/29/22 1231   sodium chloride flush (NS) 0.9 % injection 5 mL, 5 mL, Intracatheter, Q8H, Mugweru, Jon, MD, 5 mL at 09/30/22 0646   vancomycin (VANCOCIN) IVPB 1000 mg/200 mL premix, 1,000 mg, Intravenous, Q12H, Harvie Bridge C, MD, Last Rate: 200 mL/hr at 09/30/22 0236, 1,000 mg at 09/30/22 0236    Vital Signs: BP 121/86 (BP Location: Right Arm)   Pulse 80   Temp 98.4 F (36.9 C) (Oral)   Resp 18   Ht 5\' 4"  (1.626 m)   Wt 135 lb 12.8 oz (61.6 kg)   SpO2 99%   BMI 23.31 kg/m   Physical Exam Vitals and nursing note reviewed.  Constitutional:      Appearance: She is well-developed.  HENT:     Head: Normocephalic and atraumatic.  Eyes:     Conjunctiva/sclera: Conjunctivae normal.  Pulmonary:     Effort: Pulmonary effort is normal.  Abdominal:     Comments: Drain intact, site clean. Serosanguinous output    Musculoskeletal:        General: Normal range of motion.     Cervical back: Normal range of motion.  Skin:    General: Skin is warm.  Neurological:     Mental Status: She is alert and oriented to person, place, and time.     Imaging: CT  Angio Abd/Pel w/ and/or w/o  Result Date: 09/27/2022 CLINICAL DATA:  Postop hematoma or abscess suspected. Post IR drain placement. Increasing drain output. EXAM: CTA ABDOMEN AND PELVIS WITHOUT AND WITH CONTRAST TECHNIQUE: Multidetector CT imaging of the abdomen and pelvis was performed using the standard protocol during bolus administration of intravenous contrast. Multiplanar reconstructed images and MIPs were obtained and reviewed to evaluate the vascular anatomy. RADIATION DOSE REDUCTION: This exam was performed according to the departmental dose-optimization program which includes automated exposure control, adjustment of the mA and/or kV according to patient size and/or  use of iterative reconstruction technique. CONTRAST:  75mL OMNIPAQUE IOHEXOL 350 MG/ML SOLN COMPARISON:  September 26, 2022 FINDINGS: VASCULAR Aorta: Normal caliber aorta without aneurysm, dissection, vasculitis or significant stenosis. Celiac: Patent without evidence of aneurysm, dissection, vasculitis or significant stenosis. SMA: Patent without evidence of aneurysm, dissection, vasculitis or significant stenosis. Renals: Both renal arteries are patent without evidence of aneurysm, dissection, vasculitis, fibromuscular dysplasia or significant stenosis. IMA: Patent without evidence of aneurysm, dissection, vasculitis or significant stenosis. Inflow: Patent without evidence of aneurysm, dissection, vasculitis or significant stenosis. Proximal Outflow: Bilateral common femoral and visualized portions of the superficial and profunda femoral arteries are patent without evidence of aneurysm, dissection, vasculitis or significant stenosis. Veins: No obvious venous abnormality within the limitations of this arterial phase study. Review of the MIP images confirms the above findings. NON-VASCULAR Lower chest: Bibasilar atelectasis. Hepatobiliary: No focal liver abnormality is seen. No gallstones, gallbladder wall thickening, or biliary dilatation. Pancreas: Unremarkable. No pancreatic ductal dilatation or surrounding inflammatory changes. Spleen: Normal in size without focal abnormality. Adrenals/Urinary Tract: Adrenal glands are unremarkable. Kidneys are normal, without renal calculi, focal lesion, or hydronephrosis. Bladder is unremarkable. Stomach/Bowel: Stomach is within normal limits. Appendix appears normal. No evidence of bowel wall thickening, distention, or inflammatory changes. Lymphatic: No significant vascular findings are present. No enlarged abdominal or pelvic lymph nodes. Reproductive: The uterus is enlarged and heterogeneous with complex fluid collection anteriorly in the lower uterine segment measuring 5.1 by  1.8 by 3.0 cm. There is convex deformity of the anterior uterine wall at the site of this abnormality. Possible tract is seen extending to the retroperitoneum. This could be best appreciated on the sagittal images, 19/180, and coronal images 57/151. This may represent an area of thinning of the anterior wall of the uterus or a true tract to the peritoneum. Other: Complex thick-walled multiloculated rim enhancing collection in the anterior abdominal cavity and extending to the anterior abdominal wall has enlarged measuring 8.5 by 4.2 by 10.6 cm. Pigtail catheter in the posterior aspect of this collection. Musculoskeletal: No acute or significant osseous findings. IMPRESSION: 1. No evidence of abdominal aortic aneurysm or dissection. 2. Non-vascular. 3. Enlarged and heterogeneous uterus with complex fluid collection anteriorly in the lower uterine segment measuring 5.1 by 1.8 by 3.0 cm. There is convex deformity of the anterior uterine wall overlying this abnormality. Possible tract is seen extending to the retroperitoneum. This may represent an area of thinning of the anterior wall of the uterus or a true tract to the peritoneum. 4. Complex thick-walled multiloculated rim enhancing collection in the anterior abdominal cavity and extending to the anterior abdominal wall has enlarged measuring 8.5 by 4.2 by 10.6 cm. Electronically Signed   By: Ted Mcalpine M.D.   On: 09/27/2022 14:06   CT GUIDED VISCERAL FLUID DRAIN BY PERC CATH  Result Date: 09/27/2022 INDICATION: [id number] Postoperative abscess [id number] recent C-section with anterior pelvic fluid collection. EXAM: CT-GUIDED ANTERIOR  PELVIC DRAIN PLACEMENT COMPARISON:  CT AP, 06/27/2022 MEDICATIONS: The patient is currently admitted to the hospital and receiving intravenous antibiotics. The antibiotics were administered within an appropriate time frame prior to the initiation of the procedure. ANESTHESIA/SEDATION: Moderate (conscious) sedation was employed  during this procedure. A total of Versed 1 mg and Fentanyl 50 mcg was administered intravenously. Moderate Sedation Time: 22 minutes. The patient's level of consciousness and vital signs were monitored continuously by radiology nursing throughout the procedure under my direct supervision. CONTRAST:  None COMPLICATIONS: None immediate. PROCEDURE: RADIATION DOSE REDUCTION: This exam was performed according to the departmental dose-optimization program which includes automated exposure control, adjustment of the mA and/or kV according to patient size and/or use of iterative reconstruction technique. Informed written consent was obtained from the patient and/or patient's representative after a discussion of the risks, benefits and alternatives to treatment. The patient was placed supine on the CT gantry and a pre procedural CT was performed re-demonstrating the known abscess/fluid collection within the anterior pelvis. The procedure was planned. A timeout was performed prior to the initiation of the procedure. The anterior pelvis was prepped and draped in the usual sterile fashion. The overlying soft tissues were anesthetized with 1% lidocaine with epinephrine. Appropriate trajectory was planned with the use of a 22 gauge spinal needle. An 18 gauge trocar needle was advanced into the abscess/fluid collection and a short Amplatz super stiff wire was coiled within the collection. Appropriate positioning was confirmed with a limited CT scan. The tract was serially dilated allowing placement of a 14 Fr drainage catheter. Appropriate positioning was confirmed with a limited postprocedural CT scan. 5 ml of sanguinous fluid was aspirated. The tube was connected to a bulb suction and sutured in place. A dressing was placed. The patient tolerated the procedure well without immediate post procedural complication. IMPRESSION: Successful CT guided placement of a 14 Fr drain catheter into the anterior pelvis fluid collection with  aspiration of 5 mL of sanguinous fluid. Samples were sent to the laboratory as requested by the ordering clinical team. RECOMMENDATIONS: The patient will return to Vascular Interventional Radiology (VIR) for routine drainage catheter evaluation and exchange in 10-14 days. Roanna Banning, MD [facility] Electronically Signed   By: Roanna Banning M.D.   On: 09/27/2022 12:21   CT ABDOMEN PELVIS W CONTRAST  Result Date: 09/26/2022 CLINICAL DATA:  Sepsis, postop, abdominal pain. Post cesarean section 09/11/2022. EXAM: CT ABDOMEN AND PELVIS WITH CONTRAST TECHNIQUE: Multidetector CT imaging of the abdomen and pelvis was performed using the standard protocol following bolus administration of intravenous contrast. RADIATION DOSE REDUCTION: This exam was performed according to the departmental dose-optimization program which includes automated exposure control, adjustment of the mA and/or kV according to patient size and/or use of iterative reconstruction technique. CONTRAST:  54mL OMNIPAQUE IOHEXOL 350 MG/ML SOLN COMPARISON:  09/12/2022 FINDINGS: Lower chest: Lung bases are clear. Hepatobiliary: Liver, gallbladder and biliary tree are normal. Pancreas: Normal. Spleen: Normal. Adrenals/Urinary Tract: Adrenal glands are normal. Kidneys are normal in size. Minimal prominence of the intrarenal collecting systems bilaterally. Ureters are normal. Mild bladder distention. Stomach/Bowel: Stomach and small bowel are within normal. Appendix not visualized. Colon is unremarkable. Vascular/Lymphatic: Abdominal aorta is normal in caliber. Remaining vascular structures are unremarkable. No adenopathy. Reproductive: Mild prominence of the uterus with interval decrease in size compatible patient's recent postpartum state. Small fleck of air over the endometrium and lower uterine segment which may be due to recent instrumentation versus infection/endometritis.  Significant decrease in  peritoneal fluid only a small amount of perihepatic fluid in the upper abdomen. Interval improvement in previously seen pelvic and peritoneal hemorrhage. No significant fluid/hemorrhage in the pelvis. There is a partially rim enhancing multiloculated fluid collection over the midline anterior mid to lower abdomen extending inferiorly to the pelvis anterior to the uterus and bladder. This collection measures approximately 4.2 x 6.2 x 10.9 cm in AP, transverse and craniocaudal dimensions. This may represent noninfected fluid versus evolving abscess/phlegmon. Other: None. Musculoskeletal: No focal abnormality. IMPRESSION: 1. Mild prominence of the uterus with interval decrease in size compatible with patient's recent postpartum state. Small fleck of air over the endometrium and lower uterine segment which may be due to recent instrumentation versus infection/endometritis. 2. Partially rim enhancing multiloculated fluid collection over the midline anterior mid to lower abdomen extending inferiorly to the pelvis anterior to the uterus and bladder. This collection measures approximately 4.2 x 6.2 x 10.9 cm in AP, transverse and craniocaudal dimensions. This is concerning for an evolving abscess/phlegmon and less likely noninfected postsurgical fluid. 3. Significant decrease in peritoneal fluid only a small amount of perihepatic fluid in the upper abdomen. Interval improvement/resolution in previously seen pelvic and peritoneal hemorrhage. Electronically Signed   By: Elberta Fortis M.D.   On: 09/26/2022 16:40   DG Chest Port 1 View  Result Date: 09/26/2022 CLINICAL DATA:  Sepsis EXAM: PORTABLE CHEST 1 VIEW COMPARISON:  04/22/2022 and prior studies FINDINGS: The cardiomediastinal silhouette is unremarkable. There is no evidence of focal airspace disease, pulmonary edema, suspicious pulmonary nodule/mass, pleural effusion, or pneumothorax. No acute bony abnormalities are identified. IMPRESSION: No active disease.  Electronically Signed   By: Harmon Pier M.D.   On: 09/26/2022 13:57    Labs:  CBC: Recent Labs    09/27/22 1208 09/28/22 0956 09/29/22 0903 09/30/22 0516  WBC 7.4 10.3 9.6 10.2  HGB 15.0 12.7 12.1 12.8  HCT 44.6 38.0 37.4 39.5  PLT 565* 551* 525* 560*     COAGS: Recent Labs    09/12/22 0637 09/12/22 2024 09/13/22 0253 09/27/22 0419 09/27/22 1208  INR 1.1 1.1 1.0 1.3* 1.5*  APTT 26  --  27 41* 40*     BMP: Recent Labs    09/15/22 0338 09/16/22 0042 09/17/22 0427 09/26/22 1256 09/27/22 1208  NA 136 136 136 131*  --   K 3.8 3.9 3.6 4.1  --   CL 103 99 98 99  --   CO2 20*  --   GLUCOSE 86 95 107* 118*  --   BUN --   CALCIUM 8.5* 9.7 9.0 8.7*  --   CREATININE 0.59 0.71 0.67 0.82 0.70  GFRNONAA >60 >60 >60 >60 >60     LIVER FUNCTION TESTS: Recent Labs    09/15/22 0338 09/16/22 0042 09/17/22 0427 09/26/22 1256  BILITOT 1.1 1.6* 1.8* 1.7*  AST 182* 419* 135* 22  ALT 102* 246* 182* 29  ALKPHOS 65 88 76 83  PROT 5.6* 6.4* 6.3* 7.4  ALBUMIN 2.5* 2.9* 3.0* 3.3*     Assessment and Plan:  37 y.o. female inpatient. History of c-section X 3. Most recently on 3.27.24. complicated by adhesions. Hospital stay complicated by hemorrhage requiring blood transfusion and pre-eclampsia. Found to have intra abdominal abscess. IR placed to the anterior fluid collection.   Drain Location: anterior pelvic abscess Size: Fr size: 14 Fr Date of placement: 4.11.24  Currently to: Drain collection device: suction bulb  24 hour output:  Output by Drain (mL) 09/28/22 0701 - 09/28/22 1900 09/28/22 1901 - 09/29/22 0700 09/29/22 0701 - 09/29/22 1900 09/29/22 1901 - 09/30/22 0700 09/30/22 0701 - 09/30/22 1136  Closed System Drain 1 Right;Anterior RLQ Bulb (JP) 14 Fr. 5 15 10  7.5 75   WBC is 10.3. Cultures show no growth. Afebrile last temp.   Interval imaging/drain manipulation:  CT abd pelvis from 4.11.24 shows the drain in good position.  Current  examination: Flushes/aspirates easily.  Insertion site unremarkable. Suture and stat lock in place. Dressed appropriately.   Plan: Continue TID flushes with 5 cc NS. Record output Q shift. Dressing changes QD or PRN if soiled.  Call IR APP or on call IR MD if difficulty flushing or sudden change in drain output.  Repeat imaging/possible drain injection once output < 10 mL/QD (excluding flush material). Consideration for drain removal if output is < 10 mL/QD (excluding flush material), pending discussion with the providing surgical service.  Discharge planning: Please contact IR APP or on call IR MD prior to patient d/c to ensure appropriate follow up plans are in place. Typically patient will follow up with IR clinic 10-14 days post d/c for repeat imaging/possible drain injection. IR scheduler will contact patient with date/time of appointment. Patient will need to flush drain QD with 5 cc NS, record output QD, dressing changes every 2-3 days or earlier if soiled.   IR will continue to follow - please call with questions or concerns.   Electronically Signed: Brayton El, PA-C 09/30/2022, 11:36 AM   I spent a total of 15 Minutes at the patient's bedside AND on the patient's hospital floor or unit, greater than 50% of which was counseling/coordinating care for anterior pelvic abscess drain placement.

## 2022-10-01 LAB — CBC
HCT: 38.5 % (ref 36.0–46.0)
Hemoglobin: 12.9 g/dL (ref 12.0–15.0)
MCH: 26.9 pg (ref 26.0–34.0)
MCHC: 33.5 g/dL (ref 30.0–36.0)
MCV: 80.4 fL (ref 80.0–100.0)
Platelets: 540 10*3/uL — ABNORMAL HIGH (ref 150–400)
RBC: 4.79 MIL/uL (ref 3.87–5.11)
RDW: 15.9 % — ABNORMAL HIGH (ref 11.5–15.5)
WBC: 10.2 10*3/uL (ref 4.0–10.5)
nRBC: 0 % (ref 0.0–0.2)

## 2022-10-01 LAB — CULTURE, BLOOD (ROUTINE X 2)
Special Requests: ADEQUATE
Special Requests: ADEQUATE

## 2022-10-01 MED ORDER — SULFAMETHOXAZOLE-TRIMETHOPRIM 800-160 MG PO TABS
1.0000 | ORAL_TABLET | Freq: Two times a day (BID) | ORAL | Status: DC
  Administered 2022-10-01 – 2022-10-02 (×3): 1 via ORAL
  Filled 2022-10-01 (×5): qty 1

## 2022-10-01 NOTE — Progress Notes (Signed)
Patient ID: [name], female   DOB: [birth year], 37 y.o.   MRN: [id number]     [name] is a 37 y.o. [id number] POD#20 repeat C section with postoperative intraperitoneal hemorrhage and subsequent hematoma/abscess, IR drain placed 4/12-->approx 10 cc output in the last 24 hours    Past Medical History:  Diagnosis Date   Bacterial infection    History of chicken pox    Systolic heart failure     Past Surgical History:  Procedure Laterality Date   CESAREAN SECTION     CESAREAN SECTION N/A 02/02/2015   Procedure: CESAREAN SECTION;  Surgeon: Geryl Rankins, MD;  Location: [facility];  Service: Obstetrics;  Laterality: N/A;   CESAREAN SECTION N/A 09/11/2022   Procedure: CESAREAN SECTION;  Surgeon: Lazaro Arms, MD;  Location: [facility];  Service: Obstetrics;  Laterality: N/A;   DILATION AND CURETTAGE OF UTERUS     WISDOM TOOTH EXTRACTION       Scheduled Meds:  acyclovir ointment   Topical 5 X Daily   docusate sodium  100 mg Oral Daily   lidocaine  10 mL Intradermal Once   metoprolol succinate  25 mg Oral Daily   pantoprazole  40 mg Oral Daily   prenatal vitamin w/FE, FA  1 tablet Oral Q1200   sodium chloride flush  5 mL Intracatheter Q8H   sulfamethoxazole-trimethoprim  1 tablet Oral Q12H    Continuous Infusions:    PRN Meds:acetaminophen, calcium carbonate, HYDROmorphone, ondansetron, oxyCODONE, oxyCODONE  Allergies  Allergen Reactions   Other Other (See Comments)   Penicillins Rash    Principal Problem:   Intra-abdominal abscess hematoma  Subjective   Pt continues to feel better Still having abdominal pain, stable compared to the previous day Having some thin vaginal bleeding which is more than she has had  Objective   Vitals:   10/01/22 0005 10/01/22 0410 10/01/22 0430 10/01/22 0813  BP: 119/65 134/82  132/89  Pulse: 79 82  84  Resp: 18 18  18   Temp: 98.9 F (37.2 C) 98.9 F (37.2 C)  98.8 F (37.1 C)  TempSrc: Oral Oral  Oral  SpO2:    100%   Weight:   63.1 kg   Height:       Vitals:   09/29/22 1227 09/29/22 1654 09/29/22 1954 09/29/22 2319  BP: 103/70 116/79 (!) 129/90 (!) 126/91   09/30/22 0530 09/30/22 0753 09/30/22 1119 09/30/22 1456  BP: (!) 131/90 122/80 121/86 125/85   09/30/22 2010 10/01/22 0005 10/01/22 0410 10/01/22 0813  BP: 139/86 119/65 134/82 132/89    Drain output 20cc in 24 hours milky bloody  Subjective Objective: Vital signs (most recent): Blood pressure 132/89, pulse 84, temperature 98.8 F (37.1 C), temperature source Oral, resp. rate 18, height 5\' 4"  (1.626 m), weight 63.1 kg, SpO2 100 %, not currently breastfeeding.   Gen  WDWN NAD Abdomen  dressing for drain in place, mild tenderness, pt states it is improved Incision  clean dry intact     Latest Ref Rng & Units 10/01/2022    5:13 AM 09/30/2022    5:16 AM 09/29/2022    9:03 AM  CBC  WBC 4.0 - 10.5 K/uL 10.2  10.2  9.6   Hemoglobin 12.0 - 15.0 g/dL 74.9  44.9  67.5   Hematocrit 36.0 - 46.0 % 38.5  39.5  37.4   Platelets 150 - 400 K/uL 540  560  525        Latest Ref Rng &  Units 09/27/2022   12:08 PM 09/26/2022   12:56 PM 09/17/2022    4:27 AM  CMP  Glucose 70 - 99 mg/dL  939  030   BUN 6 - 20 mg/dL  11  11   Creatinine 0.92 - 1.00 mg/dL 3.30  0.76  2.26   Sodium 135 - 145 mmol/L  131  136   Potassium 3.5 - 5.1 mmol/L  4.1  3.6   Chloride 98 - 111 mmol/L  99  98   CO2 22 - 32 mmol/L  20  27   Calcium 8.9 - 10.3 mg/dL  8.7  9.0   Total Protein 6.5 - 8.1 g/dL  7.4  6.3   Total Bilirubin 0.3 - 1.2 mg/dL  1.7  1.8   Alkaline Phos 38 - 126 U/L  83  76   AST 15 - 41 U/L  22  135   ALT 0 - 44 U/L  29  182      Assessment & Plan Principal Problem:   Intra-abdominal abscess   PLAN: Postoperative abscess vs hematoma, c/f sepsis - improving WBC stable  Sensitivities serratia(GNR), blood cultures are negative Discussed with patient if she needs to have surgical management would recommend supracervical hysterectomy, she understands  -  s/p RLQ drain placement 4/11, IR following.              -- plan to repeat imaging when daily output is < 68mL/d vs outpatient IR follow up in 10-14 days post discharge - [facility]last 4/12 2059 100.6. WC & Hgb remain stable                         -- micro with Serratia marcescens-->sensitive to bactrim which is ok for breastfeeding, IV infiltrated and pt declined restart, but we were waiting for the sensitivities so timing is fine, stopped vanc and cefipime Keep for minimum 24 hours on oral and then evaluate disposition   2. HFrEF, HTN - continue metoprolol - discontinued nifedipine iso low normal Bps. Will resume at 30mg  daily if needed - strict I/Os, daily weights; has gained 1kg since admission no e/o fluid overload on exam   Ppx: SCDs, ambulation. If Hgb stable tomorrow, plan to resume pLov   Continue inpatient admission  Lazaro Arms, MD 10/01/2022, 8:16 AM   Patient ID: [name], female   DOB: 16-Oct-1985, 37 y.o.   MRN: [id number]

## 2022-10-01 NOTE — Progress Notes (Addendum)
Referring Physician(s): Dr. Hazle Coca  Supervising Physician: Richarda Overlie  Patient Status:  [facility] - In-pt  Chief Complaint:  Anterior pelvic abscess s/p abscess drain placement on 4.14.24 by IR Attending Dr. Donne Hazel  Subjective:  Patient states that she is feeling better. No pain with palpation.   Allergies: Other and Penicillins  Medications: Prior to Admission medications   Medication Sig Start Date End Date Taking? Authorizing Provider  metoprolol succinate (TOPROL XL) 25 MG 24 hr tablet Take 1 tablet (25 mg total) by mouth daily. 09/17/22  Yes Constant, Peggy, MD  NIFEdipine (ADALAT CC) 60 MG 24 hr tablet Take 1 tablet (60 mg total) by mouth 2 (two) times daily. 09/17/22  Yes Constant, Peggy, MD  oxyCODONE (OXY IR/ROXICODONE) 5 MG immediate release tablet Take 1 tablet (5 mg total) by mouth every 4 (four) hours as needed for moderate pain. 09/17/22  Yes Constant, Peggy, MD  aspirin (ASPIRIN CHILDRENS) 81 MG chewable tablet Chew 1 tablet (81 mg total) by mouth daily. Patient not taking: Reported on 09/24/2022 03/24/22   Rasch, Victorino Dike I, NP  ferrous sulfate 325 (65 FE) MG EC tablet Take 1 tablet (325 mg total) by mouth every other day. Patient not taking: Reported on 09/05/2022 08/30/22   Milas Hock, MD  furosemide (LASIX) 20 MG tablet Take 1 tablet (20 mg total) by mouth daily. 09/17/22   Constant, Peggy, MD  ibuprofen (ADVIL) 600 MG tablet Take 1 tablet (600 mg total) by mouth every 6 (six) hours as needed for moderate pain, cramping or fever. Patient not taking: Reported on 09/24/2022 09/17/22   Constant, Peggy, MD  Prenatal MV & Min w/FA-DHA ([employer]S PRENATAL GUMMY) 0.18-25 MG CHEW Chew 1 tablet by mouth daily. Patient not taking: Reported on 09/24/2022 03/24/22   Rasch, Victorino Dike I, NP   No leukocytosis. Cultures negative X 5 days. Patient is afebrile.  Vital Signs: BP (!) 134/95 (BP Location: Right Arm)   Pulse 78   Temp 98.1 F (36.7 C) (Oral)   Resp 17   Ht 5\' 4"  (1.626 m)   Wt  139 lb 3.2 oz (63.1 kg)   SpO2 98%   BMI 23.89 kg/m   Physical Exam Vitals and nursing note reviewed.  Constitutional:      Appearance: She is well-developed.  HENT:     Head: Normocephalic and atraumatic.  Eyes:     Conjunctiva/sclera: Conjunctivae normal.  Pulmonary:     Effort: Pulmonary effort is normal.  Abdominal:     Comments: Positive anterior pelvic  drain to  suction.Site is unremarkable with no erythema, edema, tenderness, bleeding or drainage noted at exit site. Suture and stat lock in place. Dressing is clean dry and intact. < 5 ml ml of  serosanguinous colored fluid noted in bulb suction device. Drain is able to be flushed easily. No leakage or pain with flushing.  Insertion site clean and dry.     Musculoskeletal:     Cervical back: Normal range of motion.  Skin:    General: Skin is warm and dry.  Neurological:     General: No focal deficit present.     Mental Status: She is alert and oriented to person, place, and time.  Psychiatric:        Mood and Affect: Mood normal.        Behavior: Behavior normal.        Thought Content: Thought content normal.        Judgment: Judgment normal.  Imaging: No results found.  Labs:  CBC: Recent Labs    09/28/22 0956 09/29/22 0903 09/30/22 0516 10/01/22 0513  WBC 10.3 9.6 10.2 10.2  HGB 12.7 12.1 12.8 12.9  HCT 38.0 37.4 39.5 38.5  PLT 551* 525* 560* 540*    COAGS: Recent Labs    09/12/22 0637 09/12/22 2024 09/13/22 0253 09/27/22 0419 09/27/22 1208  INR 1.1 1.1 1.0 1.3* 1.5*  APTT 26  --  27 41* 40*    BMP: Recent Labs    09/15/22 0338 09/16/22 0042 09/17/22 0427 09/26/22 1256 09/27/22 1208  NA 136 136 136 131*  --   K 3.8 3.9 3.6 4.1  --   CL 103 99 98 99  --   CO2 24 28 27  20*  --   GLUCOSE 86 95 107* 118*  --   BUN 11 14 11 11   --   CALCIUM 8.5* 9.7 9.0 8.7*  --   CREATININE 0.59 0.71 0.67 0.82 0.70  GFRNONAA >60 >60 >60 >60 >60    LIVER FUNCTION TESTS: Recent Labs     09/15/22 0338 09/16/22 0042 09/17/22 0427 09/26/22 1256  BILITOT 1.1 1.6* 1.8* 1.7*  AST 182* 419* 135* 22  ALT 102* 246* 182* 29  ALKPHOS 65 88 76 83  PROT 5.6* 6.4* 6.3* 7.4  ALBUMIN 2.5* 2.9* 3.0* 3.3*    Assessment and Plan:  37 y.o. female inpatient. History of c-section X 3. Most recently on 3.27.24. complicated by adhesions. Hospital stay complicated by hemorrhage requiring blood transfusion and pre-eclampsia. Found to have intra abdominal abscess. IR placed to the anterior fluid collection on 4.11.24  Drain Location: Anterior pelvic abscess drain  Size: Fr size: 14 Fr Date of placement: 4.11.24  Currently to: Drain collection device: suction bulb 24 hour output:  Output by Drain (mL) 09/29/22 0701 - 09/29/22 1900 09/29/22 1901 - 09/30/22 0700 09/30/22 0701 - 09/30/22 1900 09/30/22 1901 - 10/01/22 0700 10/01/22 0701 - 10/01/22 1452  Closed System Drain 1 Right;Anterior RLQ Bulb (JP) 14 Fr. 10 7.5 78    Patient is afebrile. No leukocytosis. Cultures negative X 5 days.   Interval imaging/drain manipulation:  4.11.24 CT angio Abd /Pel  Current examination: Flushes/aspirates easily.  Insertion site unremarkable. Suture and stat lock in place. Dressed appropriately.   Plan: Continue TID flushes with 5 cc NS. Record output Q shift. Dressing changes QD or PRN if soiled.  Call IR APP or on call IR MD if difficulty flushing or sudden change in drain output.  Repeat imaging/possible drain injection once output < 10 mL/QD (excluding flush material). Consideration for drain removal if output is < 10 mL/QD (excluding flush material), pending discussion with the providing surgical service.  Discharge planning: Follow up plans are in place. Typically patient will follow up with IR clinic 10-14 days post d/c for repeat imaging/possible drain injection. IR scheduler will contact patient with date/time of appointment. Patient will need to flush drain QD with 5 cc NS, record output QD,  dressing changes every 2-3 days or earlier if soiled.   IR will continue to follow - please call with questions or concerns.   Electronically Signed: Alene Mires, NP 10/01/2022, 2:50 PM   I spent a total of 15 Minutes at the patient's bedside AND on the patient's hospital floor or unit, greater than 50% of which was counseling/coordinating care for anterior pelvic abscess drain placement

## 2022-10-01 NOTE — Progress Notes (Signed)
Pt was getting to the end of her I.v Vanc therapy when her i.v got infiltrated again. I.V team was unable to get a PIV. Pt refused due to length of iv cath. Dr Despina Hidden was made aware and said it was ok to leave pt without an access  and will reevaluate later in the a.m.

## 2022-10-01 NOTE — Progress Notes (Signed)
IVT consult:  Assessed for PIV using usn. While in process of using usn pt stated to stop and she refused PIV because of iv cath length , explained need to use specific size and depth for successful uspiv but stated she will not ,but allowed for 1 inch on superficial vein,, able to get blood return back but pt is very tense,unable to thread catheter. Removed I inch piv and RN spoke w/ MD.

## 2022-10-02 ENCOUNTER — Other Ambulatory Visit (HOSPITAL_COMMUNITY): Payer: Self-pay

## 2022-10-02 ENCOUNTER — Ambulatory Visit: Payer: Medicaid Other | Admitting: Cardiology

## 2022-10-02 LAB — CBC
HCT: 39.9 % (ref 36.0–46.0)
Hemoglobin: 13.2 g/dL (ref 12.0–15.0)
MCH: 26.7 pg (ref 26.0–34.0)
MCHC: 33.1 g/dL (ref 30.0–36.0)
MCV: 80.6 fL (ref 80.0–100.0)
Platelets: 575 K/uL — ABNORMAL HIGH (ref 150–400)
RBC: 4.95 MIL/uL (ref 3.87–5.11)
RDW: 16.1 % — ABNORMAL HIGH (ref 11.5–15.5)
WBC: 11 K/uL — ABNORMAL HIGH (ref 4.0–10.5)
nRBC: 0 % (ref 0.0–0.2)

## 2022-10-02 LAB — AEROBIC/ANAEROBIC CULTURE W GRAM STAIN (SURGICAL/DEEP WOUND)

## 2022-10-02 MED ORDER — IBUPROFEN 600 MG PO TABS
600.0000 mg | ORAL_TABLET | Freq: Four times a day (QID) | ORAL | 3 refills | Status: DC | PRN
  Filled 2022-10-02: qty 60, 15d supply, fill #0

## 2022-10-02 MED ORDER — SULFAMETHOXAZOLE-TRIMETHOPRIM 800-160 MG PO TABS
1.0000 | ORAL_TABLET | Freq: Two times a day (BID) | ORAL | 0 refills | Status: DC
  Filled 2022-10-02: qty 28, 14d supply, fill #0

## 2022-10-02 MED ORDER — OXYCODONE-ACETAMINOPHEN 5-325 MG PO TABS
1.0000 | ORAL_TABLET | Freq: Four times a day (QID) | ORAL | 0 refills | Status: DC | PRN
  Filled 2022-10-02: qty 20, 5d supply, fill #0

## 2022-10-02 NOTE — Progress Notes (Signed)
Chaplain conducted chart review and attempted to visit pt due to length of stay, but pt had been discharged when chaplain arrived at pt's room.  Chaplain support remains available to pt and family in or outpatient as needed.  Please page as further needs arise.  Maryanna Shape. Carley Hammed, M.Div. [facility] Chaplain Pager [phone] Office [phone]

## 2022-10-02 NOTE — Discharge Summary (Signed)
Physician Discharge Summary  Patient ID: [name] MRN: [id number] DOB/AGE: 36-Jan-1987 36 y.o.  Admit date: 09/26/2022 Discharge date: 10/02/2022  Admission Diagnoses: postpartum wound infection  Discharge Diagnoses:  Principal Problem:   Intra-abdominal abscess   Discharged Condition: good  Hospital Course: Patient admitted with sepsis and intra-abdominal abscess. She received IV antibiotics and had IR drainage of abscess. She remained hemodynamically stable and was transitioned to oral antibiotics. She remained afebrile for over 48 hours and was found stable for discharge home. She remained stable from a CV standpoint. Discharge instructions were reviewed with the patient. Follow up appointments are being scheduled with IR and for wound check  Consults: Interventional radiology   Discharge Exam: Blood pressure (!) 129/91, pulse 72, temperature 97.8 F (36.6 C), temperature source Oral, resp. rate 16, height 5\' 4"  (1.626 m), weight 60.3 kg, SpO2 97 %, not currently breastfeeding. GENERAL: Well-developed, well-nourished female in no acute distress.  LUNGS: Clear to auscultation bilaterally.  HEART: Regular rate and rhythm. ABDOMEN: Soft, appropriately tender, non distended. Drain site intact EXTREMITIES: No cyanosis, clubbing, or edema, 2+ distal pulses.   Disposition:  There are no questions and answers to display.         Allergies as of 10/02/2022       Reactions   Other Other (See Comments)   Penicillins Rash        Medication List     STOP taking these medications    aspirin 81 MG chewable tablet Commonly known as: Aspirin Childrens   CVS Prenatal Gummy 0.18-25 MG Chew   ferrous sulfate 325 (65 FE) MG EC tablet   oxyCODONE 5 MG immediate release tablet Commonly known as: Oxy IR/ROXICODONE       TAKE these medications    furosemide 20 MG tablet Commonly known as: LASIX Take 1 tablet (20 mg total) by mouth daily.   ibuprofen 600 MG  tablet Commonly known as: ADVIL Take 1 tablet (600 mg total) by mouth every 6 (six) hours as needed. What changed: reasons to take this   metoprolol succinate 25 MG 24 hr tablet Commonly known as: Toprol XL Take 1 tablet (25 mg total) by mouth daily.   NIFEdipine 60 MG 24 hr tablet Commonly known as: ADALAT CC Take 1 tablet (60 mg total) by mouth 2 (two) times daily.   oxyCODONE-acetaminophen 5-325 MG tablet Commonly known as: PERCOCET/ROXICET Take 1 tablet by mouth every 6 (six) hours as needed.   sulfamethoxazole-trimethoprim 800-160 MG tablet Commonly known as: BACTRIM DS Take 1 tablet by mouth every 12 (twelve) hours.        Follow-up Information     [facility] Follow up in 3 week(s).   Why: IR scheduler will call with appoinment date and time. Please call [phone] with any questions or concerns Contact information: [address] [place] [address] [phone]         [facility] Follow up in 1 week(s).   Specialty: Obstetrics and Gynecology Why: An appointment will be scheduled for you next week Contact information: [address] [place] [address] [phone]                Signed: Catalina Antigua 10/02/2022, 9:08 AM

## 2022-10-02 NOTE — Progress Notes (Signed)
Pt discharged after discharge instructions given and prescribed medications delivered. All questions answered. Pt in stable condition at discharge. Pt sent home with all belongings.

## 2022-10-03 ENCOUNTER — Other Ambulatory Visit: Payer: Self-pay | Admitting: Obstetrics and Gynecology

## 2022-10-03 ENCOUNTER — Encounter: Payer: Self-pay | Admitting: Nephrology

## 2022-10-03 DIAGNOSIS — K651 Peritoneal abscess: Secondary | ICD-10-CM

## 2022-10-11 ENCOUNTER — Ambulatory Visit (INDEPENDENT_AMBULATORY_CARE_PROVIDER_SITE_OTHER): Payer: Medicaid Other | Admitting: Obstetrics and Gynecology

## 2022-10-11 ENCOUNTER — Encounter: Payer: Self-pay | Admitting: Obstetrics and Gynecology

## 2022-10-11 VITALS — BP 107/72 | HR 77 | Temp 97.9°F | Ht 64.0 in | Wt 128.0 lb

## 2022-10-11 DIAGNOSIS — O909 Complication of the puerperium, unspecified: Secondary | ICD-10-CM

## 2022-10-11 DIAGNOSIS — L929 Granulomatous disorder of the skin and subcutaneous tissue, unspecified: Secondary | ICD-10-CM

## 2022-10-11 NOTE — Progress Notes (Signed)
   POSTPARTUM PROBLEM VISIT  Subjective:  [name] is a 37 y.o. [id number] who is 4 weeks s/p rCS c/b postoperative hematoma with readmission 4/10-16 with sepsis & intraabdominal abscess. She had a RLQ drain placed by IR during that admission. She presents today for wound check.   Reports that she is feeling better. Continues on PO abx. No fevers. Pain controlled with ibuprofen, just taking percocet at night but she is now out of pills. Minimal output from drain. Has appt w/ IR next week for removal.   Noticed some drainage and an abnormal appearance of her CS incision recently.   Objective:   Vitals:   10/11/22 1459  BP: 107/72  Pulse: 77  Temp: 97.9 F (36.6 C)  Weight: 128 lb (58.1 kg)  Height:  (1.626 m)   General:  Alert, oriented and cooperative. Patient is in no acute distress.  Skin: Skin is warm and dry. No rash noted.   Cardiovascular: Normal heart rate noted  Respiratory: Normal respiratory effort, no problems with respiration noted  Abdomen: Soft, non-tender, non-distended. Interval resolution of erythema, edema & induration around her incision. Incision with 4cm length of granulation tissue. Probed with qtip and intact with no deeper tracking or weakness. No purulent drainage noted. Silver nitrate applied.  RLQ drain in place with scant serous output.   Assessment and Plan:  [name] is a 37 y.o. with complicated post op course after cesarean section presenting for incision check  CS wound complication - Silver nitrate applied to granulation tissue - Continue PO abx - Continue ibuprofen & tylenol prn. Discouraged use of percocet unless pain becomes severe with just tylenol/ibuprofen. Will defer refill for now.  - Follow up with IR as scheduled, anticipate drain removal at that appointment - Follow up wound in 1-2 weeks  Return in about 1 week (around 10/18/2022) for incision check.  Future Appointments  Date Time Provider Department Center   10/16/2022  1:20 PM GI-315 CT 2 GI-315CT GI-315 W. [facility]  10/16/2022  2:00 PM GI-315 DG C-ARM RM 3 GI-315DG GI-315 W. [facility]  10/16/2022  2:00 PM GI-315 DG IR [id number] XB-147 W. WE  10/25/2022  1:50 PM Lennart Pall, MD [facility]   Lennart Pall, MD

## 2022-10-12 ENCOUNTER — Encounter: Payer: Self-pay | Admitting: Obstetrics and Gynecology

## 2022-10-14 ENCOUNTER — Encounter: Payer: Self-pay | Admitting: Obstetrics and Gynecology

## 2022-10-15 ENCOUNTER — Encounter: Payer: Self-pay | Admitting: *Deleted

## 2022-10-15 NOTE — Progress Notes (Signed)
Referring Physician(s): Quinterious Walraven C  Chief Complaint: The patient is seen in follow up today s/p ***  History of present illness:  ***  Past Medical History:  Diagnosis Date   Bacterial infection    History of chicken pox    Systolic heart failure (HCC)     Past Surgical History:  Procedure Laterality Date   CESAREAN SECTION     CESAREAN SECTION N/A 02/02/2015   Procedure: CESAREAN SECTION;  Surgeon: Geryl Rankins, MD;  Location: [facility] ORS;  Service: Obstetrics;  Laterality: N/A;   CESAREAN SECTION N/A 09/11/2022   Procedure: CESAREAN SECTION;  Surgeon: Lazaro Arms, MD;  Location: [facility] LD ORS;  Service: Obstetrics;  Laterality: N/A;   DILATION AND CURETTAGE OF UTERUS     WISDOM TOOTH EXTRACTION      Allergies: Other and Penicillins  Medications: Prior to Admission medications   Medication Sig Start Date End Date Taking? Authorizing Provider  furosemide (LASIX) 20 MG tablet Take 1 tablet (20 mg total) by mouth daily. 09/17/22   Constant, Peggy, MD  ibuprofen (ADVIL) 600 MG tablet Take 1 tablet (600 mg total) by mouth every 6 (six) hours as needed. Patient not taking: Reported on 10/11/2022 10/02/22   Constant, Peggy, MD  metoprolol succinate (TOPROL XL) 25 MG 24 hr tablet Take 1 tablet (25 mg total) by mouth daily. 09/17/22   Constant, Peggy, MD  NIFEdipine (ADALAT CC) 60 MG 24 hr tablet Take 1 tablet (60 mg total) by mouth 2 (two) times daily. Patient not taking: Reported on 10/11/2022 09/17/22   Constant, Peggy, MD  oxyCODONE-acetaminophen (PERCOCET/ROXICET) 5-325 MG tablet Take 1 tablet by mouth every 6 (six) hours as needed. Patient not taking: Reported on 10/11/2022 10/02/22   Constant, Peggy, MD  sulfamethoxazole-trimethoprim (BACTRIM DS) 800-160 MG tablet Take 1 tablet by mouth every 12 (twelve) hours. 10/02/22   Constant, Peggy, MD     Family History  Problem Relation Age of Onset   Hypertension Mother    Hypertension Father    Diabetes Father    Hypertension  Paternal Aunt    Asthma Neg Hx    Cancer Neg Hx    Heart disease Neg Hx    Stroke Neg Hx     Social History   Socioeconomic History   Marital status: Single    Spouse name: Not on file   Number of children: Not on file   Years of education: Not on file   Highest education level: Not on file  Occupational History   Not on file  Tobacco Use   Smoking status: Never   Smokeless tobacco: Never  Vaping Use   Vaping Use: Never used  Substance and Sexual Activity   Alcohol use: No   Drug use: No   Sexual activity: Not Currently    Birth control/protection: None  Other Topics Concern   Not on file  Social History Narrative   Not on file   Social Determinants of Health   Financial Resource Strain: Not on file  Food Insecurity: No Food Insecurity (09/26/2022)   Hunger Vital Sign    Worried About Running Out of Food in the Last Year: Never true    Ran Out of Food in the Last Year: Never true  Transportation Needs: No Transportation Needs (09/26/2022)   PRAPARE - Administrator, Civil Service (Medical): No    Lack of Transportation (Non-Medical): No  Physical Activity: Not on file  Stress: Not on file  Social Connections: Not  on file     Vital Signs: There were no vitals taken for this visit.  Physical Exam  Imaging: No results found.  Labs:  CBC: Recent Labs    09/29/22 0903 09/30/22 0516 10/01/22 0513 10/02/22 0517  WBC 9.6 10.2 10.2 11.0*  HGB 12.1 12.8 12.9 13.2  HCT 37.4 39.5 38.5 39.9  PLT 525* 560* 540* 575*    COAGS: Recent Labs    09/12/22 0637 09/12/22 2024 09/13/22 0253 09/27/22 0419 09/27/22 1208  INR 1.1 1.1 1.0 1.3* 1.5*  APTT 26  --  27 41* 40*    BMP: Recent Labs    09/15/22 0338 09/16/22 0042 09/17/22 0427 09/26/22 1256 09/27/22 1208  NA 136 136 136 131*  --   K 3.8 3.9 3.6 4.1  --   CL 103 99 98 99  --   CO2 24 28 27  20*  --   GLUCOSE 86 95 107* 118*  --   BUN 11 14 11 11   --   CALCIUM 8.5* 9.7 9.0 8.7*   --   CREATININE 0.59 0.71 0.67 0.82 0.70  GFRNONAA >60 >60 >60 >60 >60    LIVER FUNCTION TESTS: Recent Labs    09/15/22 0338 09/16/22 0042 09/17/22 0427 09/26/22 1256  BILITOT 1.1 1.6* 1.8* 1.7*  AST 182* 419* 135* 22  ALT 102* 246* 182* 29  ALKPHOS 65 88 Cheryl 83  PROT 5.6* 6.4* 6.3* 7.4  ALBUMIN 2.5* 2.9* 3.0* 3.3*    Assessment: 37 y.o. Hartman inpatient. History of c-section X 3. Most recently on 3.27.24. complicated by adhesions. Hospital stay complicated by hemorrhage requiring blood transfusion and pre-eclampsia. Found to have intra abdominal abscess. IR placed to the anterior fluid collection on 4.11.24. Cultures grew serrate marcesnens and few bactericides fragilis. Patient discharged on 4.16.24  bactrim. She presents today for follow up. CT - scan and drain evaluation. She is currently the flushing the drain once daily with 5 ml of saline. Output has been minimal averaging described as scant and serosanginous in nature. She currently denies any fevers, headache, chest pain, dyspnea, cough, abdominal pain, nausea vomiting or bleeding.     Signed: Alene Mires, NP 10/15/2022, 7:18 PM   Please refer to Dr. Marland Kitchen attestation of this note for management and plan.

## 2022-10-16 ENCOUNTER — Ambulatory Visit
Admission: RE | Admit: 2022-10-16 | Discharge: 2022-10-16 | Disposition: A | Discharge status: Home / Self Care | Payer: Medicaid Other | Source: Ambulatory Visit | Attending: Radiology | Admitting: Radiology

## 2022-10-16 ENCOUNTER — Ambulatory Visit
Admission: RE | Admit: 2022-10-16 | Discharge: 2022-10-16 | Disposition: A | Discharge status: Home / Self Care | Payer: Medicaid Other | Source: Ambulatory Visit | Attending: Obstetrics and Gynecology | Admitting: Obstetrics and Gynecology

## 2022-10-16 DIAGNOSIS — K651 Peritoneal abscess: Secondary | ICD-10-CM

## 2022-10-16 DIAGNOSIS — Z4803 Encounter for change or removal of drains: Secondary | ICD-10-CM | POA: Diagnosis not present

## 2022-10-16 MED ORDER — IOPAMIDOL (ISOVUE-300) INJECTION 61%
100.0000 mL | Freq: Once | INTRAVENOUS | Status: AC | PRN
  Administered 2022-10-16: 100 mL via INTRAVENOUS

## 2022-10-18 NOTE — Progress Notes (Signed)
Cardio-Obstetrics Clinic  Follow Up Note   Date:  10/19/2022   ID:  [name], DOB 09-17-1985, MRN [id number]  PCP:  Patient, No Pcp Per   [facility] Providers Cardiologist:  [name], MD  Electrophysiologist:  None       Referring MD: No ref. provider found   Chief Complaint: Moderate to severe MR  History of Present Illness:    [name] is a 37 y.o. female [[id number]] who returns for follow up of moderate to severe MR.  Patient was hospitalized in 04/2022 for pyelonephritis.  On admission, she was noted to by tachycardic and ECG was performed which revealed sinus tachycardia with borderline LVH and inferolateral TWI. Cardiology was called overnight and TTE was recommended which revealed  LVEF 40% with apical lateral hypokinesis, normal RV, mildly thickened and calcified mitral valve leaflet (abnormal for age) but no definitive post-inflammatory changes with moderate to severe MR. Trops negative x2. BNP 109. She appeared euvolemic on exam with no chest pain or SOB. We started metoprolol 12.5mg  XL daily.  Notably, had pre-eclampsia with prior pregnancies at time of delivery. Resolved with delivery and did not require BP meds post-pregnancy.  Was seen in clinic on 05/07/22 where she was doing well from a CV standpoint. No HF symptoms. TTE 05/22/22 stable with LVEF 40-45%, normal RV, moderate to severe MR.   Cardiac monitor 07/2022 with NSR, rare ectopy, 1 six beat run of NSVT. She has been continued on metoprolol.  TTE 07/2022 with stable EF 45-50%, moderate MR, normal RV  Was last seen in clinic on 08/24/22. Was stable from a CV standpoint. Repeat TTE 08/31/22 with interval decrease in EF to 37% by 3D, moderate to severe MR. Given concern that she was developing worsening systolic HF, discussion was held with OB/MFM/anesthesia. Goal is to get to 37w. Repeat TTE 09/11/22 stable with EF 35-40%, moderate MR.  Admitted 09/11/22 with severe pre-eclampsia  and active labor. Underwent c-section which she tolerated well. Repeat TTE 09/12/22 with LVEF 30-35%, normal RV, mild MR, no other significant valve disease.   Readmitted from 04/10-04/16/24 for sepsis from an intra-abdominal abscess. She was placed on IV ABX and underwent IR drainage of the abscess.  Today, the patient overall feels okay. She is recovering from her recent hospitalization. No chest pain, SOB, orthopnea, or LE edema. She is currently taking metoprolol 25mg  daily and tolerating well. She is currently taking lasix 20mg  daily and she is wondering if she needs to continue.    Prior CV Studies Reviewed: The following studies were reviewed today: TTE 2022-09-11: IMPRESSIONS     1. Left ventricular ejection fraction, by estimation, is 35 to 40%. The  left ventricle has moderately decreased function. The left ventricle  demonstrates global hypokinesis. Left ventricular diastolic parameters are  consistent with Grade I diastolic  dysfunction (impaired relaxation). The average left ventricular global  longitudinal strain is -12.0 %. The global longitudinal strain is  abnormal.   2. Right ventricular systolic function is normal. The right ventricular  size is normal. There is normal pulmonary artery systolic pressure.   3. The mitral valve is normal in structure. Moderate mitral valve  regurgitation. No evidence of mitral stenosis.   4. The aortic valve is normal in structure. Aortic valve regurgitation is  not visualized. No aortic stenosis is present.   5. The inferior vena cava is normal in size with greater than 50%  respiratory variability, suggesting right atrial pressure of 3 mmHg.   Comparison(s):  A prior study was performed on 08/31/22. No significant  change from prior study.   TTE 08/31/22: IMPRESSIONS     1. Left ventricular ejection fraction, by estimation, is 35 to 40%. The  left ventricle has moderately decreased function. The left ventricle  demonstrates  global hypokinesis. The left ventricular internal cavity size  was mildly to moderately dilated.  There is mild left ventricular hypertrophy. Left ventricular diastolic  parameters were normal. The average left ventricular global longitudinal  strain is -14.0 %. The global longitudinal strain is abnormal.   2. Right ventricular systolic function is normal. The right ventricular  size is normal. Tricuspid regurgitation signal is inadequate for assessing  PA pressure.   3. Left atrial size was mildly dilated.   4. Moderate to severe mitral valve regurgitation.   5. The aortic valve is normal in structure. Aortic valve regurgitation is  not visualized.   6. The inferior vena cava is normal in size with greater than 50%  respiratory variability, suggesting right atrial pressure of 3 mmHg.   Conclusion(s)/Recommendation(s): Compared to prior echo 07/20/2022, EF has  mildly declined.  TTE 07/2022: IMPRESSIONS     1. Mild global reduction in LV function; moderate MR (may be  underestimated; 2 jets noted; suggest TEE to further assess).   2. Left ventricular ejection fraction, by estimation, is 45 to 50%. The  left ventricle has mildly decreased function. The left ventricle  demonstrates global hypokinesis. Left ventricular diastolic parameters  were normal. The average left ventricular  global longitudinal strain is -19.9 %. The global longitudinal strain is  normal.   3. Right ventricular systolic function is normal. The right ventricular  size is normal.   4. The mitral valve is normal in structure. Moderate mitral valve  regurgitation. No evidence of mitral stenosis.   5. The aortic valve is tricuspid. Aortic valve regurgitation is not  visualized. No aortic stenosis is present.   6. The inferior vena cava is normal in size with greater than 50%  respiratory variability, suggesting right atrial pressure of 3 mmHg.   Cardiac Monitor 07/2022:   Patch wear time was 2 days and 18 hours    Predominant rhythm is NSR with HR 86   There was 1 run of nonsustained VT lasting 6 beats   Rare ectopy (<1% SVE and VE)   Patient symptoms correlated with NSR   No sustained arrhythmias or significant pauses     Patch Wear Time:  2 days and 18 hours (2024-02-05T23:45:56-0500 to 2024-02-08T18:00:45-0500)   Patient had a min HR of 58 bpm, max HR of 179 bpm, and avg HR of 86 bpm. Predominant underlying rhythm was Sinus Rhythm. 1 run of Ventricular Tachycardia occurred lasting 6 beats with a max rate of 179 bpm (avg 167 bpm). Isolated SVEs were rare (<1.0%),  SVE Couplets were rare (<1.0%), and no SVE Triplets were present. Isolated VEs were rare (<1.0%), VE Couplets were rare (<1.0%), and no VE Triplets were present.  TTE 05/22/22: IMPRESSIONS     1. Left ventricular ejection fraction, by estimation, is 40 to 45%. Left  ventricular ejection fraction by 3D volume is 45 %. The left ventricle has  mildly decreased function. The left ventricle demonstrates global  hypokinesis. The left ventricular  internal cavity size was mildly dilated. Left ventricular diastolic  parameters were normal.   2. Right ventricular systolic function is normal. The right ventricular  size is normal. Tricuspid regurgitation signal is inadequate for assessing  PA pressure.  3. The mitral valve is normal in structure. Moderate to severe mitral  valve regurgitation.   4. The aortic valve is tricuspid. Aortic valve regurgitation is not  visualized. No aortic stenosis is present.   5. The inferior vena cava is normal in size with greater than 50%  respiratory variability, suggesting right atrial pressure of 3 mmHg.   Comparison(s): No significant change from prior study. Prior images  reviewed side by side.   Conclusion(s)/Recommendation(s): Consider TEE to clarify mechanism and  severity of mitral insufficiency.    TTE 04/22/22: IMPRESSIONS     1. Left ventricular ejection fraction, by estimation, is 40%.  Left  ventricular ejection fraction by 3D volume is 40 %. The left ventricle has  mild to moderately decreased function. The left ventricle demonstrates  global hypokinesis and regional wall  motion abnormalities (see scoring diagram/findings for description). The  left ventricular internal cavity size was mildly dilated. Left ventricular  diastolic parameters were normal.   2. Right ventricular systolic function is normal. The right ventricular  size is normal. Tricuspid regurgitation signal is inadequate for assessing  PA pressure.   3. The mitral valve is grossly normal. Moderate to severe mitral valve  regurgitation. The mitral valve is mildly thickened for age, but no  definite post inflammatory change unless clinical relevance. Suspect  mechanism is secondary MR due to LV  dysfunction. No evidence of mitral stenosis.   4. The aortic valve was not well visualized. Aortic valve regurgitation  is not visualized. No aortic stenosis is present.   5. The inferior vena cava is normal in size with greater than 50%  respiratory variability, suggesting right atrial pressure of 3 mmHg.     Past Medical History:  Diagnosis Date   Bacterial infection    History of chicken pox    Systolic heart failure [facility])     Past Surgical History:  Procedure Laterality Date   CESAREAN SECTION     CESAREAN SECTION N/A 02/02/2015   Procedure: CESAREAN SECTION;  Surgeon: Geryl Rankins, MD;  Location: [facility];  Service: Obstetrics;  Laterality: N/A;   CESAREAN SECTION N/A 09/11/2022   Procedure: CESAREAN SECTION;  Surgeon: Lazaro Arms, MD;  Location: [facility] ORS;  Service: Obstetrics;  Laterality: N/A;   DILATION AND CURETTAGE OF UTERUS     WISDOM TOOTH EXTRACTION        OB History     Gravida  5   Para  3   Term  1   Preterm  2   AB  2   Living  3      SAB  1   IAB  1   Ectopic      Multiple  0   Live Births  3               Current Medications: Current Meds   Medication Sig   furosemide (LASIX) 20 MG tablet Take 1 tablet (20 mg total) by mouth daily as needed (lower extremity swelling.).   ibuprofen (ADVIL) 600 MG tablet Take 1 tablet (600 mg total) by mouth every 6 (six) hours as needed.   LORazepam (ATIVAN) 1 MG tablet Take 1 tablet (1 mg total) by mouth 1 hour prior to Cardiac MRI (claustrophobia).   metoprolol succinate (TOPROL XL) 25 MG 24 hr tablet Take 1 tablet (25 mg total) by mouth daily.   NIFEdipine (ADALAT CC) 60 MG 24 hr tablet Take 1 tablet (60 mg total) by mouth 2 (two) times  daily.   [DISCONTINUED] furosemide (LASIX) 20 MG tablet Take 1 tablet (20 mg total) by mouth daily.     Allergies:   Other and Penicillins   Social History   Socioeconomic History   Marital status: Single    Spouse name: Not on file   Number of children: Not on file   Years of education: Not on file   Highest education level: Not on file  Occupational History   Not on file  Tobacco Use   Smoking status: Never   Smokeless tobacco: Never  Vaping Use   Vaping Use: Never used  Substance and Sexual Activity   Alcohol use: No   Drug use: No   Sexual activity: Not Currently    Birth control/protection: None  Other Topics Concern   Not on file  Social History Narrative   Not on file   Social Determinants of Health   Financial Resource Strain: Not on file  Food Insecurity: No Food Insecurity (09/26/2022)   Hunger Vital Sign    Worried About Running Out of Food in the Last Year: Never true    Ran Out of Food in the Last Year: Never true  Transportation Needs: No Transportation Needs (09/26/2022)   PRAPARE - Administrator, Civil Service (Medical): No    Lack of Transportation (Non-Medical): No  Physical Activity: Not on file  Stress: Not on file  Social Connections: Not on file      Family History  Problem Relation Age of Onset   Hypertension Mother    Hypertension Father    Diabetes Father    Hypertension Paternal Aunt     Asthma Neg Hx    Cancer Neg Hx    Heart disease Neg Hx    Stroke Neg Hx       ROS:   Please see the history of present illness.    Review of Systems  Constitutional:  Negative for chills and fever.  HENT:  Negative for nosebleeds and tinnitus.   Respiratory:  Negative for shortness of breath.   Cardiovascular:  Negative for chest pain, palpitations, orthopnea, claudication, leg swelling and PND.  Gastrointestinal:  Positive for abdominal pain. Negative for blood in stool, melena, nausea and vomiting.  Genitourinary:  Negative for dysuria and hematuria.  Musculoskeletal:  Negative for falls.  Neurological:  Negative for dizziness, loss of consciousness and headaches.    All other systems reviewed and are negative.   Labs/EKG Reviewed:    EKG:  No new tracing   Recent Labs: 07/16/2022: Magnesium 1.8 09/04/2022: B Natriuretic Peptide 87.5 09/26/2022: ALT 29; BUN 11; Potassium 4.1; Sodium 131 09/27/2022: Creatinine, Ser 0.70 10/02/2022: Hemoglobin 13.2; Platelets 575   Recent Lipid Panel No results found for: "CHOL", "TRIG", "HDL", "CHOLHDL", "LDLCALC", "LDLDIRECT"  Physical Exam:    VS:  BP 106/74   Pulse 96   Ht 5\' 4"  (1.626 m)   Wt 128 lb 8 oz (58.3 kg)   SpO2 97%   BMI 22.06 kg/m     Wt Readings from Last 3 Encounters:  10/19/22 128 lb 8 oz (58.3 kg)  10/11/22 128 lb (58.1 kg)  10/02/22 133 lb (60.3 kg)     GEN:  Comfortable, NAD HEENT: Normal NECK: No JVD; No carotid bruits CARDIAC: RRR, 3/6 systolic murmur heard throughout the precordium. No rubs or gallops RESPIRATORY:  Clear bilaterally ABDOMEN: Soft, mildly tender MUSCULOSKELETAL:  No edema, warm SKIN: Warm and dry NEUROLOGIC:  Alert and oriented x 3 PSYCHIATRIC:  Normal affect    Risk Assessment/Risk Calculators:              ASSESSMENT & PLAN:    #Chronic Systolic HF: Patient admitted in 04/2022 for pyelonephritis with ECG revealing sinus tachycardia, possible LVH and inferior TWI. This  abnormal ECG prompted TTE which revealed LVEF 40% with apical lateral hypokinesis, moderate-to-severe MR with mildly thickened valve for age. Had been stable throughout pregnancy, however, repeat TTE in 08/2022 with drop in EF to 35-40%, moderate to severe MR. Looked slightly worse at 30-35% at time of delivery. Fortunately, she is currently compensated and euvolemic. Will plan for CMR now that she is no longer pregnant. -Check CMR -Continue metoprolol 25mg  XL daily  -Change lasix to 20mg  PO daily prn for LE edema   #Moderate-to-severe MR: MV appears thickened with restricted leaflet motion (? Post-inflammatory vs rheumatic). Denies any history of rheumatic fever, IVDU, blood stream infections, severe childhood illness or family history of disease. Likely this has been chronic and ongoing and patient is compensated and relatively asymptomatic.  Will check CMR now that she is no longer pregnant. -Continue metop 25mg  XL daily  -Check CMR  #Palpitations: Improved. Zio monitor with rare ectopy, 1 six beat run of NSVT. -Continue metop 25mg  XL     Patient Instructions  Medication Instructions:   DECREASE YOUR LASIX TO 20 MG BY MOUTH DAILY AS NEEDED FOR LOWER EXTREMITY SWELLING  *If you need a refill on your cardiac medications before your next appointment, please call your pharmacy*    Testing/Procedures:    You are scheduled for Cardiac MRI on ______________. Please arrive for your appointment at ______________ ( arrive 30-45 minutes prior to test start time). ?  [facility] [address] [place], [place] [address] [phone] Please take advantage of the free valet parking available at the MAIN entrance (A entrance).  Proceed to the [facility] Radiology Department (First Floor) for check-in.    Magnetic resonance imaging (MRI) is a painless test that produces images of the inside of the body without using Xrays.  During an MRI, strong magnets and radio waves work  together in a Data processing manager to form detailed images.   MRI images may provide more details about a medical condition than X-rays, CT scans, and ultrasounds can provide.  You may be given earphones to listen for instructions.  You may eat a light breakfast and take medications as ordered with the exception of furosemide, hydrochlorothiazide, or spironolactone(fluid pill, other). Please avoid stimulants for 12 hr prior to test. (Ie. Caffeine, nicotine, chocolate, or antihistamine medications)   PLEASE TAKE ATIVAN 1 MG BY MOUTH 1 HOUR PRIOR TO YOUR CARDIAC MRI FOR CLAUSTROPHOBIA--YOU WILL HAVE TO HAVE SOMEONE DRIVE YOU HOME FROM THIS TEST  If a contrast material will be used, an IV will be inserted into one of your veins. Contrast material will be injected into your IV. It will leave your body through your urine within a day. You may be told to drink plenty of fluids to help flush the contrast material out of your system.  You will be asked to remove all metal, including: Watch, jewelry, and other metal objects including hearing aids, hair pieces and dentures. Also wearable glucose monitoring systems (ie. Freestyle Libre and Omnipods) (Braces and fillings normally are not a problem.)   TEST WILL TAKE APPROXIMATELY 1 HOUR  PLEASE NOTIFY SCHEDULING AT LEAST 24 HOURS IN ADVANCE IF YOU ARE UNABLE TO KEEP YOUR APPOINTMENT. [phone]  Please call  Rockwell Alexandria, cardiac imaging nurse navigator with any questions/concerns. Rockwell Alexandria RN Navigator Cardiac Imaging Larey Brick RN Navigator Cardiac Imaging [facility] [phone] Office     Follow-Up:  3 MONTHS WITH DR. Shari Prows HERE AT [facility]     Medication Adjustments/Labs and Tests Ordered: Current medicines are reviewed at length with the patient today.  Concerns regarding medicines are outlined above.  Tests Ordered: Orders Placed This Encounter  Procedures   MR CARDIAC MORPHOLOGY W WO  CONTRAST   Hemoglobin and hematocrit, blood   Medication Changes: Meds ordered this encounter  Medications   furosemide (LASIX) 20 MG tablet    Sig: Take 1 tablet (20 mg total) by mouth daily as needed (lower extremity swelling.).    Dispense:  30 tablet    Refill:  4    Dose decrease   LORazepam (ATIVAN) 1 MG tablet    Sig: Take 1 tablet (1 mg total) by mouth 1 hour prior to Cardiac MRI (claustrophobia).    Dispense:  1 tablet    Refill:  0     Laurance Flatten, MD

## 2022-10-19 ENCOUNTER — Ambulatory Visit: Payer: Medicaid Other | Admitting: Cardiology

## 2022-10-19 ENCOUNTER — Ambulatory Visit (INDEPENDENT_AMBULATORY_CARE_PROVIDER_SITE_OTHER): Payer: Medicaid Other | Admitting: Cardiology

## 2022-10-19 ENCOUNTER — Encounter: Payer: Self-pay | Admitting: Cardiology

## 2022-10-19 VITALS — BP 106/74 | HR 96 | Ht 64.0 in | Wt 128.5 lb

## 2022-10-19 DIAGNOSIS — Z8759 Personal history of other complications of pregnancy, childbirth and the puerperium: Secondary | ICD-10-CM | POA: Diagnosis not present

## 2022-10-19 DIAGNOSIS — I340 Nonrheumatic mitral (valve) insufficiency: Secondary | ICD-10-CM

## 2022-10-19 DIAGNOSIS — I5022 Chronic systolic (congestive) heart failure: Secondary | ICD-10-CM | POA: Diagnosis not present

## 2022-10-19 DIAGNOSIS — I5020 Unspecified systolic (congestive) heart failure: Secondary | ICD-10-CM | POA: Diagnosis not present

## 2022-10-19 DIAGNOSIS — O99412 Diseases of the circulatory system complicating pregnancy, second trimester: Secondary | ICD-10-CM

## 2022-10-19 DIAGNOSIS — R002 Palpitations: Secondary | ICD-10-CM

## 2022-10-19 MED ORDER — FUROSEMIDE 20 MG PO TABS: 20.0000 mg | ORAL_TABLET | Freq: Every day | ORAL | 4 refills | Status: DC | PRN

## 2022-10-19 MED ORDER — LORAZEPAM 1 MG PO TABS: ORAL_TABLET | ORAL | 0 refills | Status: DC

## 2022-10-19 NOTE — Patient Instructions (Addendum)
Medication Instructions:   DECREASE YOUR LASIX TO 20 MG BY MOUTH DAILY AS NEEDED FOR LOWER EXTREMITY SWELLING  *If you need a refill on your cardiac medications before your next appointment, please call your pharmacy*    Testing/Procedures:    You are scheduled for Cardiac MRI on ______________. Please arrive for your appointment at ______________ ( arrive 30-45 minutes prior to test start time). ?  [facility] [address] [place], [place] [address] [phone] Please take advantage of the free valet parking available at the MAIN entrance (A entrance).  Proceed to the [facility] Radiology Department (First Floor) for check-in.    Magnetic resonance imaging (MRI) is a painless test that produces images of the inside of the body without using Xrays.  During an MRI, strong magnets and radio waves work together in a Data processing manager to form detailed images.   MRI images may provide more details about a medical condition than X-rays, CT scans, and ultrasounds can provide.  You may be given earphones to listen for instructions.  You may eat a light breakfast and take medications as ordered with the exception of furosemide, hydrochlorothiazide, or spironolactone(fluid pill, other). Please avoid stimulants for 12 hr prior to test. (Ie. Caffeine, nicotine, chocolate, or antihistamine medications)   PLEASE TAKE ATIVAN 1 MG BY MOUTH 1 HOUR PRIOR TO YOUR CARDIAC MRI FOR CLAUSTROPHOBIA--YOU WILL HAVE TO HAVE SOMEONE DRIVE YOU HOME FROM THIS TEST  If a contrast material will be used, an IV will be inserted into one of your veins. Contrast material will be injected into your IV. It will leave your body through your urine within a day. You may be told to drink plenty of fluids to help flush the contrast material out of your system.  You will be asked to remove all metal, including: Watch, jewelry, and other metal objects including hearing aids, hair pieces and dentures. Also  wearable glucose monitoring systems (ie. Freestyle Libre and Omnipods) (Braces and fillings normally are not a problem.)   TEST WILL TAKE APPROXIMATELY 1 HOUR  PLEASE NOTIFY SCHEDULING AT LEAST 24 HOURS IN ADVANCE IF YOU ARE UNABLE TO KEEP YOUR APPOINTMENT. [phone]  Please call Rockwell Alexandria, cardiac imaging nurse navigator with any questions/concerns. Rockwell Alexandria RN Navigator Cardiac Imaging Larey Brick RN Navigator Cardiac Imaging [facility] [phone] Office     Follow-Up:  3 MONTHS WITH DR. Shari Prows HERE AT [facility]

## 2022-10-22 ENCOUNTER — Telehealth: Payer: Self-pay | Admitting: *Deleted

## 2022-10-22 NOTE — Telephone Encounter (Signed)
-----   Message from Marylynn Pearson sent at 10/22/2022  8:06 AM EDT ----- Regarding: RE: CARDIAC MRI PER DR. Shari Prows Scheduled 12/19/22 @ 3pm  ----- Message ----- From: Loa Socks, LPN Sent: 06/23/1094   2:24 PM EDT To: Florina Ou; Earnie Larsson; Loa Socks, LPN; # Subject: CARDIAC MRI PER DR. Shari Prows                  Dr. Shari Prows ordered for this pt to get a CMR done to determine severity of MR.   She recently had CBC done in hospital on 4/16 but also pended lab orders just incase it's needed.  Will be pre-medicating with ativan for claustrophobia.  Phone this into her pharmacy already.   Can you please arrange and shoot me the date thereafter?  Thanks [employer]

## 2022-10-25 ENCOUNTER — Encounter: Payer: Self-pay | Admitting: Obstetrics and Gynecology

## 2022-10-25 ENCOUNTER — Ambulatory Visit (INDEPENDENT_AMBULATORY_CARE_PROVIDER_SITE_OTHER): Payer: Medicaid Other | Admitting: Obstetrics and Gynecology

## 2022-10-25 DIAGNOSIS — Z98891 History of uterine scar from previous surgery: Secondary | ICD-10-CM

## 2022-10-25 DIAGNOSIS — M96841 Postprocedural hematoma of a musculoskeletal structure following other procedure: Secondary | ICD-10-CM | POA: Diagnosis not present

## 2022-10-25 DIAGNOSIS — I5022 Chronic systolic (congestive) heart failure: Secondary | ICD-10-CM

## 2022-10-25 DIAGNOSIS — I340 Nonrheumatic mitral (valve) insufficiency: Secondary | ICD-10-CM | POA: Diagnosis not present

## 2022-10-25 NOTE — Progress Notes (Signed)
Post Partum Visit Note  [name] is a 37 y.o. [phone] female who presents for a postpartum visit. She is 6 weeks postpartum following a repeat cesarean section.  I have fully reviewed the prenatal and intrapartum course. The delivery was at 36 gestational weeks.  Anesthesia: epidural. Complicated postpartum course- difficult CS w/ immediate post op course c/b intraabdominal hematoma She was readmitted postpartum for 7 days with sepsis and large intraabdominal abscess/hematoma that required prolonged IV antibiotics and IR drainage of the fluid collection. She then had a superficial separation of her incision that I treated with silver nitrate on 4/25.  Baby is doing well. Baby is feeding by bottle - Similac Neosure. Bleeding no bleeding. Bowel function is normal. Bladder function is normal. Patient is not sexually active. Contraception method is none. Postpartum depression screening: negative.  She reports she is doing better today. Drain removed, interval CT with resolution of anterior pelvic fluid collection. She continues to struggle with pain around her belly button and superior to her incision. This is different from CS pain that she has had in the past. Is taking ibuprofen 600mg  q6h but doesn't think it's making a difference at this point. Wakes up and feels good, but as she is up and moving around she starts to have more pain. Progressively worsens throughout the day and feels pretty severe by nigh time. Exacerbated by walking. No fevers, chills, nausea or vomiting. Tolerating a regular diet. No issues with bowel or bladder function.    Upstream - 10/25/22 1434       Pregnancy Intention Screening   Does the patient want to become pregnant in the next year? No    Does the patient's partner want to become pregnant in the next year? No    Would the patient like to discuss contraceptive options today? No      Contraception Wrap Up   Current Method Abstinence    End Method Abstinence     Contraception Counseling Provided No    How was the end contraceptive method provided? N/A            The pregnancy intention screening data noted above was reviewed. Potential methods of contraception were discussed. The patient elected to proceed with Abstinence.   Edinburgh Postnatal Depression Scale - 10/25/22 1406       Edinburgh Postnatal Depression Scale:  In the Past 7 Days   I have been able to laugh and see the funny side of things. 0    I have looked forward with enjoyment to things. 0    I have blamed myself unnecessarily when things went wrong. 2    I have been anxious or worried for no good reason. 2    I have felt scared or panicky for no good reason. 0    Things have been getting on top of me. 1    I have been so unhappy that I have had difficulty sleeping. 1    I have felt sad or miserable. 1    I have been so unhappy that I have been crying. 0    The thought of harming myself has occurred to me. 0    Edinburgh Postnatal Depression Scale Total 7            Health Maintenance Due  Topic Date Due   COVID-19 Vaccine (1) Never done    The following portions of the patient's history were reviewed and updated as appropriate: allergies, current medications, past family history,  past medical history, past social history, past surgical history, and problem list.  Review of Systems Pertinent items are noted in HPI.  Objective:  BP 123/85   Pulse 85   Temp 98.4 F (36.9 C)   Ht 5\' 4"  (1.626 m)   Wt 133 lb (60.3 kg)   BMI 22.83 kg/m    General:  alert, cooperative, and no distress   Breasts:  not indicated  Lungs: Normal respiratory effort  Heart:  Normal peripheral perfusion  Abdomen: Soft, tender to deep palpation around umbilicus, no rebound or guarding  Wound Incision well healed. Previously treated area of granulation tissue has involuted and resolved. No erythema, warmth or tenderness.   GU exam:  Deferred - pt to return for colposcopy        Assessment:   Postpartum exam Status post repeat low transverse cesarean section Postoperative hematoma, resolving Mitral valve regurgitation/insufficiency Chronic systolic heart failure  Plan:   Essential components of care per ACOG recommendations:  1.  Mood and well being: Patient with negative depression screening today. Reviewed local resources for support.  - Patient tobacco use? No.   - hx of drug use? No.    2. Infant care and feeding:  -Patient currently breastmilk feeding? No.  -Social determinants of health (SDOH) reviewed in EPIC. No concerns  3. Sexuality, contraception and birth spacing - Patient does not want a pregnancy in the next year.  Desired family size is 3 children. Previously discussed significant medical & surgical risk if she were to become pregnant again. She is not planning for any future pregnancies. - Reviewed reproductive life planning. Reviewed contraceptive methods based on pt preferences and effectiveness.  Patient desired Abstinence today.   - Discussed birth spacing of 18 months  4. Sleep and fatigue -Encouraged family/partner/community support of 4 hrs of uninterrupted sleep to help with mood and fatigue  5. Physical Recovery  - Discussed patients delivery and complications. She describes her labor as mixed. - Patient has urinary incontinence? No. - We discussed continued lifting restrictions and avoidance of sexual activity for an additional 2-4 weeks to allow for continued healing after complicated post op course. Discussed that this is likely related to ongoing healing. Reviewed ED precautions for n/v/f/c, severe acute pain. - She is going to stop ibuprofen and see if her pain remains stable  6.  Health Maintenance - HM due items addressed Yes - Last pap smear  Diagnosis  Date Value Ref Range Status  03/23/2022 - Low grade squamous intraepithelial lesion (LSIL) (A)  Corrected   She will return for colposcopy -Breast Cancer screening  indicated? No.   7. Chronic Disease/Pregnancy Condition follow up:  - Mitral valve regurgitation/insufficiency & chronic systolic heart failure  -- Last saw her cardiologist on 5/3. Doing well from functional standpoint  -- Will continue metop XL 25mg  daily with lasix 20mg  daily prn for LE edema  -- Cardiac MR scheduled 7/3  -- Next cardiology appt scheduled 7/26  Follow up: 2-4 weeks for colposcopy & reassessment of symptoms  Lennart Pall, MD [facility] [employer], [facility]

## 2022-11-19 ENCOUNTER — Encounter: Payer: Self-pay | Admitting: Obstetrics and Gynecology

## 2022-11-19 ENCOUNTER — Other Ambulatory Visit (HOSPITAL_COMMUNITY)
Admission: RE | Admit: 2022-11-19 | Discharge: 2022-11-19 | Disposition: A | Discharge status: Home / Self Care | Payer: Medicaid Other | Source: Ambulatory Visit | Attending: Obstetrics and Gynecology | Admitting: Obstetrics and Gynecology

## 2022-11-19 ENCOUNTER — Ambulatory Visit (INDEPENDENT_AMBULATORY_CARE_PROVIDER_SITE_OTHER): Payer: Medicaid Other | Admitting: Obstetrics and Gynecology

## 2022-11-19 VITALS — BP 122/85 | HR 90 | Ht 64.0 in | Wt 127.0 lb

## 2022-11-19 DIAGNOSIS — Z01812 Encounter for preprocedural laboratory examination: Secondary | ICD-10-CM

## 2022-11-19 DIAGNOSIS — R87612 Low grade squamous intraepithelial lesion on cytologic smear of cervix (LGSIL): Secondary | ICD-10-CM | POA: Insufficient documentation

## 2022-11-19 LAB — POCT URINE PREGNANCY: Preg Test, Ur: NEGATIVE

## 2022-11-19 NOTE — Progress Notes (Signed)
37 yo here for colposcopy. Patient reports feeling well and is without any complaints  Past Medical History:  Diagnosis Date   Bacterial infection    History of chicken pox    Intra-abdominal abscess (HCC) 09/26/2022   Mitral valve insufficiency    Pyelonephritis affecting pregnancy in second trimester 04/22/2022   Admitted 11/5   Systolic heart failure [facility])    Past Surgical History:  Procedure Laterality Date   CESAREAN SECTION     CESAREAN SECTION N/A 02/02/2015   Procedure: CESAREAN SECTION;  Surgeon: Geryl Rankins, MD;  Location: [facility] ORS;  Service: Obstetrics;  Laterality: N/A;   CESAREAN SECTION N/A 09/11/2022   Procedure: CESAREAN SECTION;  Surgeon: Lazaro Arms, MD;  Location: [facility] LD ORS;  Service: Obstetrics;  Laterality: N/A;   DILATION AND CURETTAGE OF UTERUS     WISDOM TOOTH EXTRACTION     Family History  Problem Relation Age of Onset   Hypertension Mother    Hypertension Father    Diabetes Father    Hypertension Paternal Aunt    Asthma Neg Hx    Cancer Neg Hx    Heart disease Neg Hx    Stroke Neg Hx    Social History   Tobacco Use   Smoking status: Never   Smokeless tobacco: Never  Vaping Use   Vaping Use: Never used  Substance Use Topics   Alcohol use: No   Drug use: No   ROS See pertinent in HPI. All other systems reviewed and non contributory Today's Vitals   11/19/22 1034  BP: 122/85  Pulse: 90  Weight: 127 lb (57.6 kg)  Height: 5\' 4"  (1.626 m)   Body mass index is 21.8 kg/m.;  GENERAL: Well-developed, well-nourished female in no acute distress.  ABDOMEN: Soft, nontender, nondistended. No organomegaly. PELVIC: Normal external female genitalia. Vagina is pink and rugated.  Normal discharge. Normal appearing cervix. Chaperone present during the pelvic exam EXTREMITIES: No cyanosis, clubbing, or edema, 2+ distal pulses.  A/P 37 yo P3 with LGSIL 10/23 here for colposcopy Patient given informed consent, signed copy in the chart, time out was  performed.  Placed in lithotomy position. Cervix viewed with speculum and colposcope after application of acetic acid.   Colposcopy adequate?  yes Acetowhite lesions?12 o'clock Punctation?no Mosaicism?  no Abnormal vasculature?  no Biopsies? 12 o'clock ECC?yes  COMMENTS: Patient was given post procedure instructions.  She will return in 2 weeks for results.  Catalina Antigua, MD

## 2022-11-21 ENCOUNTER — Encounter: Payer: Self-pay | Admitting: Obstetrics and Gynecology

## 2022-11-22 LAB — SURGICAL PATHOLOGY

## 2022-12-13 ENCOUNTER — Encounter: Payer: Self-pay | Admitting: Cardiology

## 2022-12-17 ENCOUNTER — Encounter (HOSPITAL_COMMUNITY): Payer: Self-pay

## 2022-12-19 ENCOUNTER — Ambulatory Visit (HOSPITAL_COMMUNITY)
Admission: RE | Admit: 2022-12-19 | Discharge: 2022-12-19 | Disposition: A | Discharge status: Home / Self Care | Payer: Medicaid Other | Source: Ambulatory Visit | Attending: Cardiology | Admitting: Cardiology

## 2022-12-19 ENCOUNTER — Other Ambulatory Visit: Payer: Self-pay | Admitting: Cardiology

## 2022-12-19 DIAGNOSIS — O99412 Diseases of the circulatory system complicating pregnancy, second trimester: Secondary | ICD-10-CM | POA: Insufficient documentation

## 2022-12-19 DIAGNOSIS — I5022 Chronic systolic (congestive) heart failure: Secondary | ICD-10-CM | POA: Diagnosis not present

## 2022-12-19 DIAGNOSIS — I5020 Unspecified systolic (congestive) heart failure: Secondary | ICD-10-CM

## 2022-12-19 DIAGNOSIS — R002 Palpitations: Secondary | ICD-10-CM

## 2022-12-19 DIAGNOSIS — I340 Nonrheumatic mitral (valve) insufficiency: Secondary | ICD-10-CM

## 2022-12-19 MED ORDER — GADOBUTROL 1 MMOL/ML IV SOLN
6.0000 mL | Freq: Once | INTRAVENOUS | Status: AC | PRN
  Administered 2022-12-19: 6 mL via INTRAVENOUS

## 2022-12-21 ENCOUNTER — Encounter: Payer: Self-pay | Admitting: Cardiology

## 2022-12-21 DIAGNOSIS — I340 Nonrheumatic mitral (valve) insufficiency: Secondary | ICD-10-CM

## 2022-12-21 DIAGNOSIS — R0602 Shortness of breath: Secondary | ICD-10-CM

## 2022-12-21 DIAGNOSIS — I5022 Chronic systolic (congestive) heart failure: Secondary | ICD-10-CM

## 2022-12-21 DIAGNOSIS — I5020 Unspecified systolic (congestive) heart failure: Secondary | ICD-10-CM

## 2022-12-24 ENCOUNTER — Telehealth (HOSPITAL_COMMUNITY): Payer: Self-pay | Admitting: Vascular Surgery

## 2022-12-24 ENCOUNTER — Ambulatory Visit: Payer: Medicaid Other | Attending: Cardiology

## 2022-12-24 ENCOUNTER — Telehealth: Payer: Self-pay | Admitting: *Deleted

## 2022-12-24 DIAGNOSIS — I340 Nonrheumatic mitral (valve) insufficiency: Secondary | ICD-10-CM

## 2022-12-24 DIAGNOSIS — I5020 Unspecified systolic (congestive) heart failure: Secondary | ICD-10-CM

## 2022-12-24 DIAGNOSIS — I5022 Chronic systolic (congestive) heart failure: Secondary | ICD-10-CM

## 2022-12-24 DIAGNOSIS — R0602 Shortness of breath: Secondary | ICD-10-CM

## 2022-12-24 NOTE — Telephone Encounter (Signed)
-----   Message from Loa Socks, LPN sent at 06/23/1094  8:02 AM EDT -----  ----- Message ----- From: [name], MD Sent: 12/21/2022   9:25 AM EDT To: [address] Triage  Called the patient and left a message as well as sent a [employer]. Will arrange for HF clinic follow-up for further management. Just FYI in case she calls.

## 2022-12-24 NOTE — Telephone Encounter (Signed)
Lvm giving new chf appt

## 2022-12-24 NOTE — Telephone Encounter (Signed)
BMET and PRO-BNP ordered and scheduled for the pt to come in today to get this done.   Pt aware and agrees with this plan.

## 2022-12-24 NOTE — Telephone Encounter (Signed)
Referral to CHF clinic will be placed and the pt is aware she will receive a call back to arrange this appt.   CHF clinic referral is to further manage and establish with, being Dr. Shari Prows is leaving.

## 2022-12-25 LAB — BASIC METABOLIC PANEL
BUN/Creatinine Ratio: 18 (ref 9–23)
BUN: 12 mg/dL (ref 6–20)
CO2: 23 mmol/L (ref 20–29)
Calcium: 8.4 mg/dL — ABNORMAL LOW (ref 8.7–10.2)
Creatinine, Ser: 0.65 mg/dL (ref 0.57–1.00)
Glucose: 90 mg/dL (ref 70–99)

## 2022-12-26 LAB — BASIC METABOLIC PANEL
Chloride: 108 mmol/L — ABNORMAL HIGH (ref 96–106)
Potassium: 3.8 mmol/L (ref 3.5–5.2)
Sodium: 141 mmol/L (ref 134–144)
eGFR: 117 mL/min/{1.73_m2} (ref >59)

## 2022-12-26 LAB — PRO B NATRIURETIC PEPTIDE: NT-Pro BNP: 1114 pg/mL — ABNORMAL HIGH (ref 0–130)

## 2022-12-27 ENCOUNTER — Telehealth: Payer: Self-pay | Admitting: *Deleted

## 2022-12-27 ENCOUNTER — Encounter: Payer: Self-pay | Admitting: Obstetrics and Gynecology

## 2022-12-27 ENCOUNTER — Ambulatory Visit: Payer: Medicaid Other | Admitting: Obstetrics and Gynecology

## 2022-12-27 VITALS — BP 115/79 | HR 99 | Resp 16 | Ht 64.0 in | Wt 126.0 lb

## 2022-12-27 DIAGNOSIS — R1030 Lower abdominal pain, unspecified: Secondary | ICD-10-CM | POA: Diagnosis not present

## 2022-12-27 DIAGNOSIS — Z3009 Encounter for other general counseling and advice on contraception: Secondary | ICD-10-CM

## 2022-12-27 DIAGNOSIS — I5022 Chronic systolic (congestive) heart failure: Secondary | ICD-10-CM

## 2022-12-27 DIAGNOSIS — R0602 Shortness of breath: Secondary | ICD-10-CM

## 2022-12-27 DIAGNOSIS — I5020 Unspecified systolic (congestive) heart failure: Secondary | ICD-10-CM

## 2022-12-27 DIAGNOSIS — R309 Painful micturition, unspecified: Secondary | ICD-10-CM

## 2022-12-27 DIAGNOSIS — R7989 Other specified abnormal findings of blood chemistry: Secondary | ICD-10-CM

## 2022-12-27 DIAGNOSIS — N3090 Cystitis, unspecified without hematuria: Secondary | ICD-10-CM | POA: Diagnosis not present

## 2022-12-27 DIAGNOSIS — Z79899 Other long term (current) drug therapy: Secondary | ICD-10-CM

## 2022-12-27 LAB — POCT URINALYSIS DIPSTICK
Bilirubin, UA: NEGATIVE
Blood, UA: NEGATIVE
Glucose, UA: NEGATIVE
Ketones, UA: NEGATIVE
Nitrite, UA: POSITIVE
Protein, UA: NEGATIVE
Spec Grav, UA: 1.02 (ref 1.010–1.025)
Urobilinogen, UA: NEGATIVE E.U./dL — AB
pH, UA: 6 (ref 5.0–8.0)

## 2022-12-27 MED ORDER — SLYND 4 MG PO TABS: 1.0000 | ORAL_TABLET | Freq: Every day | ORAL | 3 refills | Status: DC

## 2022-12-27 MED ORDER — POTASSIUM CHLORIDE CRYS ER 20 MEQ PO TBCR
EXTENDED_RELEASE_TABLET | ORAL | 0 refills | Status: DC
Start: 2022-12-27 — End: 2023-01-02

## 2022-12-27 MED ORDER — FUROSEMIDE 20 MG PO TABS
ORAL_TABLET | ORAL | 0 refills | Status: DC
Start: 2022-12-27 — End: 2023-01-02

## 2022-12-27 MED ORDER — NITROFURANTOIN MONOHYD MACRO 100 MG PO CAPS: 100.0000 mg | ORAL_CAPSULE | Freq: Two times a day (BID) | ORAL | 0 refills | Status: AC

## 2022-12-27 NOTE — Progress Notes (Signed)
   RETURN GYNECOLOGY VISIT  Subjective:  [name] is a 37 y.o. [id number] w/ LMP 11/28/22 who is 15wk s/p rCS c/b c/b postoperative hematoma with readmission 4/10-16 with sepsis & intraabdominal abscess who is presenting for lower abdominal pain  Reports lower abdominal pain x 1 week. Is worried it's related to her CS. No n/v/f/c. Some dysuria. No frequency/urgency. Feels tender over LLQ but no constipation.   Desires oral contraceptives for birth control.  Hx mitral valve regurgitation and new HFrEF. Most recent EF 30-35% on 12/19/22.  I personally reviewed her UA from 12/27/22 and cardiac MRI from 12/19/22.  Objective:   Vitals:   12/27/22 0831  BP: 115/79  Pulse: 99  Resp: 16  Weight: 126 lb (57.2 kg)  Height: 5\' 4"  (1.626 m)   General:  Alert, oriented and cooperative. Patient is in no acute distress.  Skin: Skin is warm and dry. No rash noted.   Cardiovascular: Normal heart rate noted  Respiratory: Normal respiratory effort, no problems with respiration noted  Abdomen: Soft, non-distended, mild LLQ tenderness without rebound or guarding    Assessment and Plan:  Brytni Dray is a 37 y.o. with abdominal pain likely due to acute cystitis and desire for contraception  Cystitis Lower abdominal pain UA +LE & nitrite Discussed pain this far out is very unlikely to be related to her CS. Will treat for cystitis & follow up w/ imaging if pain does not resolve.  -     POCT Urinalysis Dipstick -     Urine Culture -     nitrofurantoin, macrocrystal-monohydrate, (MACROBID) 100 MG capsule; Take 1 capsule (100 mg total) by mouth 2 (two) times daily for 5 days.  Encounter for general counseling and advice on contraceptive management Systolic heart failure, unspecified HF chronicity (HCC) Pt desires pills for contraception. Would avoid estrogen-containing methods given reduced EF and ?PP cardiomyopathy. Pt agreeable to POPs, reviewed that all pills are active pills and need to take  at same time daily.  Recommended back up method x 1 week -     Drospirenone (SLYND) 4 MG TABS; Take 1 tablet (4 mg total) by mouth daily.  Follow up in 1 year for annual or sooner prn  Future Appointments  Date Time Provider Department Center  01/02/2023 11:40 AM Dorthula Nettles, DO [facility] None  01/11/2023  1:40 PM Shari Prows, Kathlynn Grate, MD [facility] None   Lennart Pall, MD

## 2022-12-27 NOTE — Telephone Encounter (Signed)
-----   Message from [name] sent at 12/27/2022  8:48 AM EDT ----- Her fluid levels are elevated which is likely why she is not feeling well. Can we have her starting taking the lasix 20mg  daily for 5 days with potassium daily and repeat BMET in 1 week. If she is not noticing improvement in her symptoms, let us know and we can increase her lasix to 40mg  daily. Would take the lasix 20mg  daily for at least 5 days or until she feels back to baseline in terms of her breathing. Please keep [place] posted on how she is feeling.

## 2022-12-27 NOTE — Telephone Encounter (Signed)
The patient has been notified of the result and verbalized understanding.  All questions (if any) were answered.  Pt is aware that we will send her in lasix 20 mg po daily for 5 days as well as KDUR 20 mEq po daily for 5 days.  Advised the pt to let us know if symptoms don't improve on this, for we can always increase her lasix to 40 mg po daily thereafter.  Advised the pt that Dr. Shari Prows said the lasix 20 mg daily should be taken for at least 5 days (with the potassium) or until she feels back to baseline.  Pt aware I will send in a few extra pills of each incase she needs them.  Did inform her that we will need her to come back in for repeat BMET in one week since we are starting her on these medications.   Confirmed the pharmacy of choice with the pt.   Scheduled her repeat BMET in one week on 01/03/23, here in the office.  Pt verbalized understanding and agrees with this plan.

## 2023-01-01 LAB — URINE CULTURE

## 2023-01-02 ENCOUNTER — Ambulatory Visit
Admission: RE | Admit: 2023-01-02 | Discharge: 2023-01-02 | Disposition: A | Discharge status: Home / Self Care | Payer: Medicaid Other | Source: Ambulatory Visit | Attending: Cardiology | Admitting: Cardiology

## 2023-01-02 ENCOUNTER — Encounter (HOSPITAL_COMMUNITY): Payer: Self-pay | Admitting: Cardiology

## 2023-01-02 ENCOUNTER — Other Ambulatory Visit (HOSPITAL_COMMUNITY): Payer: Self-pay

## 2023-01-02 VITALS — BP 102/68 | HR 95 | Wt 126.0 lb

## 2023-01-02 DIAGNOSIS — R002 Palpitations: Secondary | ICD-10-CM | POA: Insufficient documentation

## 2023-01-02 DIAGNOSIS — O1490 Unspecified pre-eclampsia, unspecified trimester: Secondary | ICD-10-CM | POA: Insufficient documentation

## 2023-01-02 DIAGNOSIS — I5022 Chronic systolic (congestive) heart failure: Secondary | ICD-10-CM

## 2023-01-02 DIAGNOSIS — Z79899 Other long term (current) drug therapy: Secondary | ICD-10-CM | POA: Diagnosis not present

## 2023-01-02 DIAGNOSIS — I340 Nonrheumatic mitral (valve) insufficiency: Secondary | ICD-10-CM | POA: Insufficient documentation

## 2023-01-02 DIAGNOSIS — I428 Other cardiomyopathies: Secondary | ICD-10-CM | POA: Insufficient documentation

## 2023-01-02 DIAGNOSIS — I5020 Unspecified systolic (congestive) heart failure: Secondary | ICD-10-CM | POA: Diagnosis present

## 2023-01-02 LAB — COMPREHENSIVE METABOLIC PANEL
ALT: 35 U/L (ref 0–44)
AST: 27 U/L (ref 15–41)
Albumin: 4 g/dL (ref 3.5–5.0)
Alkaline Phosphatase: 41 U/L (ref 38–126)
Anion gap: 7 (ref 5–15)
BUN: 19 mg/dL (ref 6–20)
CO2: 27 mmol/L (ref 22–32)
Calcium: 8.9 mg/dL (ref 8.9–10.3)
Chloride: 101 mmol/L (ref 98–111)
Creatinine, Ser: 0.8 mg/dL (ref 0.44–1.00)
GFR, Estimated: >60 mL/min (ref >60)
Glucose, Bld: 76 mg/dL (ref 70–99)
Potassium: 4 mmol/L (ref 3.5–5.1)
Sodium: 135 mmol/L (ref 135–145)
Total Bilirubin: 1.4 mg/dL — ABNORMAL HIGH (ref 0.3–1.2)
Total Protein: 7.5 g/dL (ref 6.5–8.1)

## 2023-01-02 LAB — TSH: TSH: 0.788 u[IU]/mL (ref 0.350–4.500)

## 2023-01-02 LAB — BRAIN NATRIURETIC PEPTIDE: B Natriuretic Peptide: 57.9 pg/mL (ref 0.0–100.0)

## 2023-01-02 MED ORDER — FUROSEMIDE 20 MG PO TABS: 20.0000 mg | ORAL_TABLET | ORAL | 2 refills | Status: DC

## 2023-01-02 MED ORDER — DIGOXIN 125 MCG PO TABS: 0.1250 mg | ORAL_TABLET | Freq: Every day | ORAL | 3 refills | Status: DC

## 2023-01-02 MED ORDER — METOPROLOL SUCCINATE ER 25 MG PO TB24: 12.5000 mg | ORAL_TABLET | Freq: Every day | ORAL | Status: DC

## 2023-01-02 MED ORDER — DAPAGLIFLOZIN PROPANEDIOL 10 MG PO TABS: 10.0000 mg | ORAL_TABLET | Freq: Every day | ORAL | 11 refills | Status: DC

## 2023-01-02 MED ORDER — SPIRONOLACTONE 25 MG PO TABS: 12.5000 mg | ORAL_TABLET | Freq: Every day | ORAL | 3 refills | Status: DC

## 2023-01-02 NOTE — Progress Notes (Signed)
ReDS Vest / Clip - 01/02/23 1200       ReDS Vest / Clip   Station Marker A    Ruler Value 25    ReDS Value Range Low volume    ReDS Actual Value 26

## 2023-01-02 NOTE — Patient Instructions (Addendum)
START Digoxin 125 mcg daily.  START Spironolactone 12.5 mg ( 1/2 Tab) daily   START Metoprolol 12.5 mg ( 1/2 tab) daily.  CHANGE Lasix to 20 mg every other day.  STOP Potassium.  START Farxiga 10 mg daily.  Labs done today, your results will be available in MyChart, we will contact you for abnormal readings.  Repeat blood work in 10 days.  Your physician recommends that you schedule a follow-up appointment in: 3 weeks  If you have any questions or concerns before your next appointment please send [place] a message through [place] or call our office at [phone].    TO LEAVE A MESSAGE FOR THE NURSE SELECT OPTION 2, PLEASE LEAVE A MESSAGE INCLUDING: YOUR NAME DATE OF BIRTH CALL BACK NUMBER REASON FOR CALL**this is important as we prioritize the call backs  YOU WILL RECEIVE A CALL BACK THE SAME DAY AS LONG AS YOU CALL BEFORE 4:00 PM  At the [facility], you and your health needs are our priority. As part of our continuing mission to provide you with exceptional heart care, we have created designated Provider Care Teams. These Care Teams include your primary Cardiologist (physician) and Advanced Practice Providers (APPs- Physician Assistants and Nurse Practitioners) who all work together to provide you with the care you need, when you need it.   You may see any of the following providers on your designated Care Team at your next follow up: Dr Arvilla Meres Dr Marca Ancona Dr. Marcos Eke, NP Robbie Lis, [place] [facility] [place], [place] Brynda Peon, NP Karle Plumber, PharmD   Please be sure to bring in all your medications bottles to every appointment.    Thank you for choosing [facility]

## 2023-01-02 NOTE — Progress Notes (Signed)
[facility] NOTE  Referring Physician: No ref. provider found  Primary Care: Patient, No Pcp Per Primary Cardiologist: Laurance Flatten, MD HF: Dorthula Nettles, DO  HPI: [name] is a 37 y.o. female with heart failure with reduced ejection fraction presenting today to establish care.  Her cardiac history dates back to November 2023 when she was admitted with pyelonephritis.  At that time she was noted to be tachycardic with inferolateral T wave inversions on ECG.  She had a follow-up echocardiogram demonstrating EF of 40%, apical lateral hypokinesis and moderate to severe mitral regurgitation.  She was started on Toprol 12.5 mg daily and since that time has been followed by Laurance Flatten.  She had a repeat echocardiogram in 2/24 with a EF of 45 to 50% and moderate MR.  In March 2024 she was admitted with severe preeclampsia and active labor.  She underwent C-section at that time with echocardiogram demonstrating EF of 30 to 35%.  Unfortunately once more readmitted in April 2024 for sepsis secondary to intra-abdominal abscess requiring IV antibiotics and IR drainage.  Most recently underwent cardiac MRI in July 24 with a EF of 31%, no LGE and moderate mitral regurgitation.  Interval hx:  She continues to become quickly fatigued. She can perform ADLs without difficulty, however, with any moderate exertion (going to grocery store, walking up 1 flight of stairs) she becomes SOB requiring a short break.   Activity level/exercise tolerance:  NYHA III Orthopnea:  Sleeps on 2 pillows Paroxysmal noctural dyspnea:  No Chest pain/pressure:  No Orthostatic lightheadedness:  No Palpitations:  No Lower extremity edema:  No Presyncope/syncope:  No Cough:  yes  Past Medical History:  Diagnosis Date   Bacterial infection    History of chicken pox    Intra-abdominal abscess (HCC) 09/26/2022   Mitral valve insufficiency    Pyelonephritis affecting pregnancy in second  trimester 04/22/2022   Admitted 11/5   Systolic heart failure (HCC)     Current Outpatient Medications  Medication Sig Dispense Refill   Drospirenone (SLYND) 4 MG TABS Take 1 tablet (4 mg total) by mouth daily. 90 tablet 3   furosemide (LASIX) 20 MG tablet Take 1 tablet (20 mg total) by mouth daily for 5 days, or until symptoms improve. 15 tablet 0   potassium chloride SA (KLOR-CON M) 20 MEQ tablet Take 1 tablet (20 mEq total) by mouth daily for 5 days or until symptoms improve. 15 tablet 0   No current facility-administered medications for this visit.    Allergies  Allergen Reactions   Other Other (See Comments)   Penicillins Rash      Social History   Socioeconomic History   Marital status: Single    Spouse name: Not on file   Number of children: Not on file   Years of education: Not on file   Highest education level: Not on file  Occupational History   Not on file  Tobacco Use   Smoking status: Never   Smokeless tobacco: Never  Vaping Use   Vaping status: Never Used  Substance and Sexual Activity   Alcohol use: No   Drug use: No   Sexual activity: Not Currently    Birth control/protection: None  Other Topics Concern   Not on file  Social History Narrative   Not on file   Social Determinants of Health   Financial Resource Strain: Not on file  Food Insecurity: No Food Insecurity (09/26/2022)   Hunger Vital Sign  Worried About Programme researcher, broadcasting/film/video in the Last Year: Never true    Ran Out of Food in the Last Year: Never true  Transportation Needs: No Transportation Needs (09/26/2022)   PRAPARE - Administrator, Civil Service (Medical): No    Lack of Transportation (Non-Medical): No  Physical Activity: Not on file  Stress: Not on file  Social Connections: Not on file  Intimate Partner Violence: Not At Risk (09/26/2022)   Humiliation, Afraid, Rape, and Kick questionnaire    Fear of Current or Ex-Partner: No    Emotionally Abused: No    Physically  Abused: No    Sexually Abused: No      Family History  Problem Relation Age of Onset   Hypertension Mother    Hypertension Father    Diabetes Father    Hypertension Paternal Aunt    Asthma Neg Hx    Cancer Neg Hx    Heart disease Neg Hx    Stroke Neg Hx     PHYSICAL EXAM: There were no vitals filed for this visit. GENERAL: Well nourished, well developed, and in no apparent distress at rest.  HEENT: Negative for arcus senilis or xanthelasma. There is no scleral icterus.  The mucous membranes are pink and moist.   NECK: Supple, No masses. Normal carotid upstrokes without bruits. No masses or thyromegaly.    CHEST: There are no chest wall deformities. There is no chest wall tenderness. Respirations are unlabored.  Lungs- mild crackles at bases CARDIAC:  JVP: 9 cm H2O         Normal S1, S2  Normal rate with regular rhythm. No murmurs, rubs or gallops.  Pulses are 2+ and symmetrical in upper and lower extremities. 1+ edema.  ABDOMEN: Soft, non-tender, non-distended. There are no masses or hepatomegaly. There are normal bowel sounds.  EXTREMITIES: Warm and well perfused with no cyanosis, clubbing.  LYMPHATIC: No axillary or supraclavicular lymphadenopathy.  NEUROLOGIC: Patient is oriented x3 with no focal or lateralizing neurologic deficits.  PSYCH: Patients affect is appropriate, there is no evidence of anxiety or depression.  SKIN: Warm and dry; no lesions or wounds.   DATA REVIEW  ECG: 09/12/22: sinus tachycardia  as per my interpretation  ECHO: 09/12/22: LVEF 30-35%, normal RV function as per my  interpretation 09/07/22: LVEF 35%, severe MR.  07/20/22: LVEF 45-50%, moderate MR.   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  NYHA class / AHA Stage:III Volume status & Diuretics: lasix 20mg  daily; will switch to every other day.   Vasodilators:will start at follow up Beta-Blocker:start digoxin daily. Repeat labs in 10 days.  NFA:[id number] 12.5mg  daily Cardiometabolic: farxiga 10mg   Devices therapies & Valvulopathies:not currently indicated Advanced therapies:Plan on CPX in the near future.   2.  Moderate mitral regurgitation -Previously severe, improving with GDMT.  Cardiac MRI in July 2024 with moderate MR (regurgitant fraction 24%)  3. Palpitations - Improving;    [name] Advanced [facility] Mechanical Circulatory Support

## 2023-01-03 ENCOUNTER — Other Ambulatory Visit (HOSPITAL_COMMUNITY): Payer: Self-pay

## 2023-01-03 ENCOUNTER — Ambulatory Visit: Payer: Medicaid Other

## 2023-01-07 ENCOUNTER — Telehealth (HOSPITAL_COMMUNITY): Payer: Self-pay

## 2023-01-07 ENCOUNTER — Encounter (HOSPITAL_COMMUNITY): Payer: Self-pay | Admitting: Cardiology

## 2023-01-07 ENCOUNTER — Other Ambulatory Visit (HOSPITAL_COMMUNITY): Payer: Self-pay

## 2023-01-07 NOTE — Telephone Encounter (Signed)
Advanced Heart Failure Patient Advocate Encounter  Discussed insurance with the patient, she has stopped being covered by the primary coverage, and needs to have Medicaid updated to solo coverage. She will reach out to have this process started. Providing samples in the meantime.  Medication Samples have been left at registration desk for patient pick up. Drug name: [name] 10 MG Qty: 2x 14 ct packages LOT: [id number] Exp.: 07/2025 SIG: Take 1 tablet by mouth once daily   The patient has been instructed regarding the correct time, dose, and frequency of taking this medication, including desired effects and most common side effects.   Burnell Blanks, CPhT Rx Patient Advocate Phone: [phone]

## 2023-01-07 NOTE — Progress Notes (Signed)
Cardio-Obstetrics Clinic  Follow Up Note   Date:  01/11/2023   ID:  [name], DOB [birth year], MRN [id number]  PCP:  Patient, No Pcp Per   [facility] Providers Cardiologist:  [name], MD  Electrophysiologist:  None       Referring MD: No ref. provider found   Chief Complaint: Moderate to severe MR  History of Present Illness:    [name] is a 37 y.o. female [[id number]] who returns for follow up of moderate to severe MR.  Patient was hospitalized in 04/2022 for pyelonephritis.  On admission, she was noted to by tachycardic and ECG was performed which revealed sinus tachycardia with borderline LVH and inferolateral TWI. Cardiology was called overnight and TTE was recommended which revealed  LVEF 40% with apical lateral hypokinesis, normal RV, mildly thickened and calcified mitral valve leaflet (abnormal for age) but no definitive post-inflammatory changes with moderate to severe MR. Trops negative x2. BNP 109. She appeared euvolemic on exam with no chest pain or SOB. We started metoprolol 12.5mg  XL daily.  Notably, had pre-eclampsia with prior pregnancies at time of delivery. Resolved with delivery and did not require BP meds post-pregnancy.  Was seen in clinic on 05/07/22 where she was doing well from a CV standpoint. No HF symptoms. TTE 05/22/22 stable with LVEF 40-45%, normal RV, moderate to severe MR.   Cardiac monitor 07/2022 with NSR, rare ectopy, 1 six beat run of NSVT. She has been continued on metoprolol.  TTE 07/2022 with stable EF 45-50%, moderate MR, normal RV  Was last seen in clinic on 08/24/22. Was stable from a CV standpoint. Repeat TTE 08/31/22 with interval decrease in EF to 37% by 3D, moderate to severe MR. Given concern that she was developing worsening systolic HF, discussion was held with OB/MFM/anesthesia. Goal is to get to 37w. Repeat TTE 03/22 stable with EF 35-40%, moderate MR.  Admitted 09/11/22 with severe pre-eclampsia  and active labor. Underwent c-section which she tolerated well. Repeat TTE 09/12/22 with LVEF 30-35%, normal RV, mild MR, no other significant valve disease.   Readmitted from 04/10-04/16/24 for sepsis from an intra-abdominal abscess. She was placed on IV ABX and underwent IR drainage of the abscess.  CMR 12/2022 with EF 31%, normal RV, no LGE, moderate MR with RF 24%. Was referred to HF clinic where she continued to have dyspnea on exertion with moderate activity. Was started on dig , sprio 12.5mg , farxiga 10mg  daily  Today, the patient overall feels okay. Continues to feel fatigued. Has some lightheadedness this week and has been holding her lasix.  Has occasional orthopnea but no PND. Blood pressure has been controlled with no lows. Compliant with medications.    Prior CV Studies Reviewed: The following studies were reviewed today: Cardiac Studies & Procedures       ECHOCARDIOGRAM  ECHOCARDIOGRAM LIMITED 09/12/2022  Narrative ECHOCARDIOGRAM LIMITED REPORT    Patient Name:   [name] Date of Exam: 09/12/2022 Medical Rec #:  [id number]        Height:       64.0 in Accession #:    [id number]       Weight:       145.9 lb Date of Birth:  Jul 27, 1985        BSA:          1.711 m Patient Age:    37 years         BP:           100/76  mmHg Patient Gender: F                HR:           104 bpm. Exam Location:  Inpatient  Procedure: Limited Echo, Cardiac Doppler, Limited Color Doppler and Intracardiac Opacification Agent  Indications:    Abnormal ECG R94.31 Cardiomyopathy-Unspecified I42.9  History:        Patient has prior history of Echocardiogram examinations, most recent 09/07/2022. CHF; Maternal mitral valve regurgitation affecting pregnancy in third trimester, antepartum.  Sonographer:    Aron Baba Referring Phys: [id number] Lazaro Arms   Sonographer Comments: Image acquisition challenging due to patient body habitus and Image acquisition challenging due to  respiratory motion. IMPRESSIONS   1. Left ventricular ejection fraction, by estimation, is 30 to 35%. The left ventricle has moderately decreased function. The left ventricle demonstrates global hypokinesis. There is moderate asymmetric left ventricular hypertrophy of the inferior segment. 2. Right ventricular systolic function is normal. The right ventricular size is normal. 3. The mitral valve is normal in structure. Mild mitral valve regurgitation. No evidence of mitral stenosis. 4. The aortic valve is normal in structure. Aortic valve regurgitation is not visualized. No aortic stenosis is present. 5. The inferior vena cava is normal in size with greater than 50% respiratory variability, suggesting right atrial pressure of 3 mmHg.  FINDINGS Left Ventricle: Left ventricular ejection fraction, by estimation, is 30 to 35%. The left ventricle has moderately decreased function. The left ventricle demonstrates global hypokinesis. Definity contrast agent was given IV to delineate the left ventricular endocardial borders. The left ventricular internal cavity size was normal in size. There is moderate asymmetric left ventricular hypertrophy of the inferior segment.  Right Ventricle: The right ventricular size is normal. No increase in right ventricular wall thickness. Right ventricular systolic function is normal.  Left Atrium: Left atrial size was normal in size.  Right Atrium: Right atrial size was normal in size.  Pericardium: There is no evidence of pericardial effusion.  Mitral Valve: The mitral valve is normal in structure. Mild mitral valve regurgitation. No evidence of mitral valve stenosis.  Tricuspid Valve: The tricuspid valve is normal in structure. Tricuspid valve regurgitation is not demonstrated. No evidence of tricuspid stenosis.  Aortic Valve: The aortic valve is normal in structure. Aortic valve regurgitation is not visualized. No aortic stenosis is present.  Pulmonic Valve:  The pulmonic valve was normal in structure. Pulmonic valve regurgitation is not visualized. No evidence of pulmonic stenosis.  Aorta: The aortic root is normal in size and structure.  Venous: The inferior vena cava is normal in size with greater than 50% respiratory variability, suggesting right atrial pressure of 3 mmHg.  IAS/Shunts: No atrial level shunt detected by color flow Doppler.  Additional Comments: Spectral Doppler performed. Color Doppler performed.  LEFT VENTRICLE PLAX 2D LVIDd:         4.40 cm LVIDs:         4.30 cm LV PW:         1.70 cm LV IVS:        0.90 cm  LV Volumes (MOD) LV vol d, MOD A2C: 34.9 ml LV vol d, MOD A4C: 65.1 ml LV vol s, MOD A2C: 28.9 ml LV vol s, MOD A4C: 48.9 ml LV SV MOD A2C:     6.0 ml LV SV MOD A4C:     65.1 ml LV SV MOD BP:      8.1 ml  MITRAL VALVE MV Area (  PHT): 3.89 cm MV Decel Time: 195 msec MR Peak grad: 77.4 mmHg MR Vmax:      440.00 cm/s MV E velocity: 86.50 cm/s MV A velocity: 56.10 cm/s MV E/A ratio:  1.54  Kardie Tobb DO Electronically signed by Thomasene Ripple DO Signature Date/Time: 09/12/2022/5:25:06 PM    Final    MONITORS  LONG TERM MONITOR (3-14 DAYS) 08/06/2022  Narrative   Patch wear time was 2 days and 18 hours   Predominant rhythm is NSR with HR 86   There was 1 run of nonsustained VT lasting 6 beats   Rare ectopy (<1% SVE and VE)   Patient symptoms correlated with NSR   No sustained arrhythmias or significant pauses   Patch Wear Time:  2 days and 18 hours (2024-02-05T23:45:56-0500 to 2024-02-08T18:00:45-0500)  Patient had a min HR of 58 bpm, max HR of 179 bpm, and avg HR of 86 bpm. Predominant underlying rhythm was Sinus Rhythm. 1 run of Ventricular Tachycardia occurred lasting 6 beats with a max rate of 179 bpm (avg 167 bpm). Isolated SVEs were rare (<1.0%), SVE Couplets were rare (<1.0%), and no SVE Triplets were present. Isolated VEs were rare (<1.0%), VE Couplets were rare (<1.0%), and no VE  Triplets were present.    CARDIAC MRI  MR CARDIAC MORPHOLOGY W WO CONTRAST 12/19/2022  Narrative CLINICAL DATA:  61F with HFrEF. EF 30-35% on echo. Moderate to severe MR  EXAM: CARDIAC MRI  TECHNIQUE: The patient was scanned on a 1.5 Tesla Siemens magnet. A dedicated cardiac coil was used. Functional imaging was done using Fiesta sequences. 2,3, and 4 chamber views were done to assess for RWMA's. Modified Simpson's rule using a short axis stack was used to calculate an ejection fraction on a dedicated work [employer]. The patient received 6 cc of Gadavist. After 10 minutes inversion recovery sequences were used to assess for infiltration and scar tissue. Phase contrast velocity mapping was performed above the aortic and pulmonic valves  CONTRAST:  6 cc  of Gadavist  FINDINGS: Left ventricle:  -Severe dilatation  -Moderate systolic dysfunction  -Normal ECV (28%)  -No LGE  LV EF:  31% (Normal 52-79%)  Absolute volumes:  LV EDV: (Normal 78-167 mL)  LV ESV: (Normal 21-64 mL)  LV SV: 82mL (Normal 52-114 mL)  CO: 7.1L/min (Normal 2.7-6.3 L/min)  Indexed volumes:  LV EDV: 167mL/sq-m (Normal 50-96 mL/sq-m)  LV ESV: 155mL/sq-m (Normal 10-40 mL/sq-m)  LV SV: 52mL/sq-m (Normal 33-64 mL/sq-m)  CI: 4.4L/min/sq-m (Normal 1.9-3.9 L/min/sq-m)  Right ventricle: Normal size and systolic function  RV EF: 54% (Normal 52-80%)  Absolute volumes:  RV EDV: (Normal 79-175 mL)  RV ESV: 55mL (Normal 13-75 mL)  RV SV: 64mL (Normal 56-110 mL)  CO: 5.6L/min (Normal 2.7-6 L/min)  Indexed volumes:  RV EDV: 18mL/sq-m (Normal 51-97 mL/sq-m)  RV ESV: 51mL/sq-m (Normal 9-42 mL/sq-m)  RV SV: 76mL/sq-m (Normal 35-61 mL/sq-m)  CI: 3.5L/min/sq-m (Normal 1.8-3.8 L/min/sq-m)  Left atrium: Normal size  Right atrium: Normal size  Mitral valve: Moderate regurgitation (regurgitant fraction 24%)  Aortic valve: Tricuspid.  Trivial  regurgitation  Tricuspid valve: Trivial regurgitation  Pulmonic valve: No regurgitation  Aorta: Normal proximal ascending aorta  Pericardium: Normal  IMPRESSION: 1.  Severe LV dilatation with moderate systolic dysfunction (EF 31%)  2.  Normal RV size and systolic function (EF 54%)  3.  No late gadolinium enhancement to suggest myocardial scar  4.  Moderate mitral regurgitation (regurgitant fraction 24%)   Electronically  Signed By: Epifanio Lesches M.D. On: 12/20/2022 14:30             Past Medical History:  Diagnosis Date   Bacterial infection    History of chicken pox    Intra-abdominal abscess (HCC) 09/26/2022   Mitral valve insufficiency    Pyelonephritis affecting pregnancy in second trimester 04/22/2022   Admitted 11/5   Systolic heart failure [facility])     Past Surgical History:  Procedure Laterality Date   CESAREAN SECTION     CESAREAN SECTION N/A 02/02/2015   Procedure: CESAREAN SECTION;  Surgeon: Geryl Rankins, MD;  Location: [facility] ORS;  Service: Obstetrics;  Laterality: N/A;   CESAREAN SECTION N/A 09/11/2022   Procedure: CESAREAN SECTION;  Surgeon: Lazaro Arms, MD;  Location: [facility] LD ORS;  Service: Obstetrics;  Laterality: N/A;   DILATION AND CURETTAGE OF UTERUS     WISDOM TOOTH EXTRACTION        OB History     Gravida  5   Para  3   Term  1   Preterm  2   AB  2   Living  3      SAB  1   IAB  1   Ectopic      Multiple  0   Live Births  3               Current Medications: Current Meds  Medication Sig   dapagliflozin propanediol (FARXIGA) 10 MG TABS tablet Take 1 tablet (10 mg total) by mouth daily before breakfast.   digoxin (LANOXIN) 0.125 MG tablet Take 1 tablet (0.125 mg total) by mouth daily.   furosemide (LASIX) 20 MG tablet Take 1 tablet (20 mg total) by mouth every other day.   metoprolol succinate (TOPROL XL) 25 MG 24 hr tablet Take 0.5 tablets (12.5 mg total) by mouth daily.   spironolactone (ALDACTONE) 25 MG  tablet Take 0.5 tablets (12.5 mg total) by mouth daily.     Allergies:   Other and Penicillins   Social History   Socioeconomic History   Marital status: Single    Spouse name: Not on file   Number of children: Not on file   Years of education: Not on file   Highest education level: Not on file  Occupational History   Not on file  Tobacco Use   Smoking status: Never   Smokeless tobacco: Never  Vaping Use   Vaping status: Never Used  Substance and Sexual Activity   Alcohol use: No   Drug use: No   Sexual activity: Not Currently    Birth control/protection: None  Other Topics Concern   Not on file  Social History Narrative   Not on file   Social Determinants of Health   Financial Resource Strain: Not on file  Food Insecurity: No Food Insecurity (09/26/2022)   Hunger Vital Sign    Worried About Running Out of Food in the Last Year: Never true    Ran Out of Food in the Last Year: Never true  Transportation Needs: No Transportation Needs (09/26/2022)   PRAPARE - Administrator, Civil Service (Medical): No    Lack of Transportation (Non-Medical): No  Physical Activity: Not on file  Stress: Not on file  Social Connections: Not on file      Family History  Problem Relation Age of Onset   Hypertension Mother    Hypertension Father    Diabetes Father    Hypertension Paternal Aunt  Asthma Neg Hx    Cancer Neg Hx    Heart disease Neg Hx    Stroke Neg Hx       ROS:   Please see the history of present illness.      All other systems reviewed and are negative.   Labs/EKG Reviewed:    EKG:  No new tracing   Recent Labs: 07/16/2022: Magnesium 1.8 10/02/2022: Hemoglobin 13.2; Platelets 575 12/24/2022: NT-Pro BNP 1,114 01/02/2023: ALT 35; B Natriuretic Peptide 57.9; BUN 19; Creatinine, Ser 0.80; Potassium 4.0; Sodium 135; TSH 0.788   Recent Lipid Panel No results found for: "CHOL", "TRIG", "HDL", "CHOLHDL", "LDLCALC", "LDLDIRECT"  Physical Exam:     VS:  BP 112/80   Pulse 77   Ht 5\' 4"  (1.626 m)   Wt 126 lb (57.2 kg)   LMP 11/28/2022   SpO2 98%   BMI 21.63 kg/m     Wt Readings from Last 3 Encounters:  01/11/23 126 lb (57.2 kg)  01/02/23 126 lb (57.2 kg)  12/27/22 126 lb (57.2 kg)     GEN:  Comfortable, NAD HEENT: Normal NECK: No JVD; No carotid bruits CARDIAC: RRR, 3/6 systolic murmur heard throughout the precordium. No rubs or gallops RESPIRATORY:  Clear bilaterally ABDOMEN: Soft, mildly tender MUSCULOSKELETAL:  No edema, warm SKIN: Warm and dry NEUROLOGIC:  Alert and oriented x 3 PSYCHIATRIC:  Normal affect    Risk Assessment/Risk Calculators:              ASSESSMENT & PLAN:    #Chronic Systolic HF: #NICM: Patient admitted in 04/2022 for pyelonephritis with ECG revealing sinus tachycardia, possible LVH and inferior TWI. This abnormal ECG prompted TTE which revealed LVEF 40% with apical lateral hypokinesis, moderate-to-severe MR with mildly thickened valve for age. Repeat TTE in 08/2022 with drop in EF to 35-40%, moderate to severe MR. Looked slightly worse at 30-35% at time of delivery. Post-partum CMR with EF 31%, no LGE, moderate MR. Currently, euvolemic and compensated on exam. Continues to have NYHA class III symptoms. -Continue metop 12.5mg  XL daily -Continue spiro 12.5mg  daily -Continue dig daily -Continue farxiga 10mg  daily -Decrease lasix to 20mg  PO M, W, F given recent start of farxgia and current lightheadedness -Follow-up in HF clinic as scheduled -Advised against future pregnancies; patient plans to start OCP  #Moderate-to-severe MR: MV appears thickened with restricted leaflet motion (? Post-inflammatory vs rheumatic). Denies any history of rheumatic fever, IVDU, blood stream infections, severe childhood illness or family history of disease. Likely this has been chronic and ongoing and patient was compensated at time of diagnosis. Post-partum CMR with moderate MR. Will managed HFrEF as  above and follow MR serially going forward. -CMR with moderate MR -Manage HFrEF as above -Continue monitoring with serial echoes  #Palpitations: Improved. Zio monitor with rare ectopy, 1 six beat run of NSVT. -Continue metop 12.5mg  XL     There are no Patient Instructions on file for this visit.    Medication Adjustments/Labs and Tests Ordered: Current medicines are reviewed at length with the patient today.  Concerns regarding medicines are outlined above.  Tests Ordered: No orders of the defined types were placed in this encounter.  Medication Changes: No orders of the defined types were placed in this encounter.    Laurance Flatten, MD

## 2023-01-08 ENCOUNTER — Encounter (HOSPITAL_COMMUNITY): Payer: Self-pay | Admitting: Cardiology

## 2023-01-11 ENCOUNTER — Ambulatory Visit (INDEPENDENT_AMBULATORY_CARE_PROVIDER_SITE_OTHER): Payer: Medicaid Other | Admitting: Cardiology

## 2023-01-11 ENCOUNTER — Other Ambulatory Visit (HOSPITAL_COMMUNITY): Payer: Medicaid Other

## 2023-01-11 ENCOUNTER — Encounter: Payer: Self-pay | Admitting: Cardiology

## 2023-01-11 VITALS — BP 112/80 | HR 77 | Ht 64.0 in | Wt 126.0 lb

## 2023-01-11 DIAGNOSIS — I340 Nonrheumatic mitral (valve) insufficiency: Secondary | ICD-10-CM

## 2023-01-11 DIAGNOSIS — I428 Other cardiomyopathies: Secondary | ICD-10-CM | POA: Diagnosis not present

## 2023-01-11 DIAGNOSIS — I5022 Chronic systolic (congestive) heart failure: Secondary | ICD-10-CM

## 2023-01-11 DIAGNOSIS — R002 Palpitations: Secondary | ICD-10-CM | POA: Diagnosis not present

## 2023-01-11 MED ORDER — FUROSEMIDE 20 MG PO TABS: ORAL_TABLET | ORAL | 2 refills | Status: DC

## 2023-01-11 NOTE — Patient Instructions (Addendum)
Medication Instructions:   DECREASE YOUR LASIX TO TAKING 20 MG BY MOUTH ONLY ON MONDAY, WEDNESDAY, AND FRIDAY.  *If you need a refill on your cardiac medications before your next appointment, please call your pharmacy*     FOLLOW-UP:  AS SCHEDULED WITH DR. Gasper Lloyd AT [facility] ON 01/24/23

## 2023-01-12 ENCOUNTER — Encounter (HOSPITAL_COMMUNITY): Payer: Self-pay | Admitting: Cardiology

## 2023-01-13 ENCOUNTER — Encounter: Payer: Self-pay | Admitting: Obstetrics and Gynecology

## 2023-01-14 ENCOUNTER — Encounter: Payer: Self-pay | Admitting: *Deleted

## 2023-01-14 NOTE — Progress Notes (Signed)
Copy of last 2 office visits from [facility] faxed and mailed to [employer] per their request.

## 2023-01-16 ENCOUNTER — Encounter: Payer: Self-pay | Admitting: Cardiology

## 2023-01-16 ENCOUNTER — Encounter: Payer: Self-pay | Admitting: Obstetrics and Gynecology

## 2023-01-17 ENCOUNTER — Ambulatory Visit: Payer: Medicaid Other

## 2023-01-17 VITALS — BP 110/77 | HR 90 | Temp 98.2°F | Wt 122.0 lb

## 2023-01-17 DIAGNOSIS — R300 Dysuria: Secondary | ICD-10-CM | POA: Diagnosis not present

## 2023-01-17 LAB — POCT URINALYSIS DIPSTICK
Bilirubin, UA: NEGATIVE
Glucose, UA: POSITIVE — AB
Ketones, UA: NEGATIVE
Nitrite, UA: POSITIVE
Protein, UA: NEGATIVE
Spec Grav, UA: 1.025 (ref 1.010–1.025)
Urobilinogen, UA: 0.2 E.U./dL
pH, UA: 5.5 (ref 5.0–8.0)

## 2023-01-17 MED ORDER — NITROFURANTOIN MONOHYD MACRO 100 MG PO CAPS
100.0000 mg | ORAL_CAPSULE | Freq: Two times a day (BID) | ORAL | 0 refills | Status: DC
Start: 2023-01-17 — End: 2023-01-24

## 2023-01-17 NOTE — Progress Notes (Signed)
Pt c/o frequent urination and dysuria. Pt denies vaginal itching/irritation. Urine dip is consistent with UTI. Macrobid 100mg  BID for 5 days sent per protocol. Urine culture sent.

## 2023-01-23 NOTE — Progress Notes (Incomplete)
ADVANCED HEART FAILURE CLINIC NOTE  Referring Physician: No ref. provider found  Primary Care: Patient, No Pcp Per Primary Cardiologist: Laurance Flatten, MD HF: Dorthula Nettles, DO  HPI: [name] is a 37 y.o. female with heart failure with reduced ejection fraction presenting today to establish care.  Her cardiac history dates back to November 2023 when she was admitted with pyelonephritis.  At that time she was noted to be tachycardic with inferolateral T wave inversions on ECG.  She had a follow-up echocardiogram demonstrating EF of 40%, apical lateral hypokinesis and moderate to severe mitral regurgitation.  She was started on Toprol 12.5 mg daily and since that time has been followed by Laurance Flatten.  She had a repeat echocardiogram in 2/24 with a EF of 45 to 50% and moderate MR.  In March 2024 she was admitted with severe preeclampsia and active labor.  She underwent C-section at that time with echocardiogram demonstrating EF of 30 to 35%.  Unfortunately once more readmitted in April 2024 for sepsis secondary to intra-abdominal abscess requiring IV antibiotics and IR drainage.  Most recently underwent cardiac MRI in July 24 with a EF of 31%, no LGE and moderate mitral regurgitation.  Interval hx:  She continues to become quickly fatigued. She can perform ADLs without difficulty, however, with any moderate exertion (going to grocery store, walking up 1 flight of stairs) she becomes SOB requiring a short break.   Activity level/exercise tolerance:  NYHA III Orthopnea:  Sleeps on 2 pillows Paroxysmal noctural dyspnea:  No Chest pain/pressure:  No Orthostatic lightheadedness:  No Palpitations:  No Lower extremity edema:  No Presyncope/syncope:  No Cough:  yes  Past Medical History:  Diagnosis Date   Bacterial infection    History of chicken pox    Intra-abdominal abscess (HCC) 09/26/2022   Mitral valve insufficiency    Pyelonephritis affecting pregnancy in second  trimester 04/22/2022   Admitted 11/5   Systolic heart failure (HCC)     Current Outpatient Medications  Medication Sig Dispense Refill   dapagliflozin propanediol (FARXIGA) 10 MG TABS tablet Take 1 tablet (10 mg total) by mouth daily before breakfast. 30 tablet 11   digoxin (LANOXIN) 0.125 MG tablet Take 1 tablet (0.125 mg total) by mouth daily. 90 tablet 3   Drospirenone (SLYND) 4 MG TABS Take 1 tablet (4 mg total) by mouth daily. (Patient not taking: Reported on 01/02/2023) 90 tablet 3   furosemide (LASIX) 20 MG tablet Take 1 tablet (20 mg total) by mouth only on Monday, Wednesday, and Friday. 45 tablet 2   metoprolol succinate (TOPROL XL) 25 MG 24 hr tablet Take 0.5 tablets (12.5 mg total) by mouth daily.     nitrofurantoin, macrocrystal-monohydrate, (MACROBID) 100 MG capsule Take 1 capsule (100 mg total) by mouth 2 (two) times daily. 10 capsule 0   spironolactone (ALDACTONE) 25 MG tablet Take 0.5 tablets (12.5 mg total) by mouth daily. 45 tablet 3   No current facility-administered medications for this visit.    Allergies  Allergen Reactions   Other Other (See Comments)   Penicillins Rash      Social History   Socioeconomic History   Marital status: Single    Spouse name: Not on file   Number of children: Not on file   Years of education: Not on file   Highest education level: Not on file  Occupational History   Not on file  Tobacco Use   Smoking status: Never   Smokeless tobacco: Never  Vaping  Use   Vaping status: Never Used  Substance and Sexual Activity   Alcohol use: No   Drug use: No   Sexual activity: Not Currently    Birth control/protection: None  Other Topics Concern   Not on file  Social History Narrative   Not on file   Social Determinants of Health   Financial Resource Strain: Not on file  Food Insecurity: No Food Insecurity (09/26/2022)   Hunger Vital Sign    Worried About Running Out of Food in the Last Year: Never true    Ran Out of Food in the  Last Year: Never true  Transportation Needs: No Transportation Needs (09/26/2022)   PRAPARE - Administrator, Civil Service (Medical): No    Lack of Transportation (Non-Medical): No  Physical Activity: Not on file  Stress: Not on file  Social Connections: Not on file  Intimate Partner Violence: Not At Risk (09/26/2022)   Humiliation, Afraid, Rape, and Kick questionnaire    Fear of Current or Ex-Partner: No    Emotionally Abused: No    Physically Abused: No    Sexually Abused: No      Family History  Problem Relation Age of Onset   Hypertension Mother    Hypertension Father    Diabetes Father    Hypertension Paternal Aunt    Asthma Neg Hx    Cancer Neg Hx    Heart disease Neg Hx    Stroke Neg Hx     PHYSICAL EXAM: There were no vitals filed for this visit. GENERAL: Well nourished, well developed, and in no apparent distress at rest.  HEENT: Negative for arcus senilis or xanthelasma. There is no scleral icterus.  The mucous membranes are pink and moist.   NECK: Supple, No masses. Normal carotid upstrokes without bruits. No masses or thyromegaly.    CHEST: There are no chest wall deformities. There is no chest wall tenderness. Respirations are unlabored.  Lungs- *** CARDIAC:  JVP: *** cm          Normal rate with regular rhythm. No murmurs, rubs or gallops.  Pulses are 2+ and symmetrical in upper and lower extremities. *** edema.  ABDOMEN: Soft, non-tender, non-distended. There are no masses or hepatomegaly. There are normal bowel sounds.  EXTREMITIES: Warm and well perfused with no cyanosis, clubbing.  LYMPHATIC: No axillary or supraclavicular lymphadenopathy.  NEUROLOGIC: Patient is oriented x3 with no focal or lateralizing neurologic deficits.  PSYCH: Patients affect is appropriate, there is no evidence of anxiety or depression.  SKIN: Warm and dry; no lesions or wounds.    DATA REVIEW  ECG: 09/12/22: sinus tachycardia  as per my  interpretation  ECHO: 09/12/22: LVEF 30-35%, normal RV function as per my  interpretation 09/07/22: LVEF 35%, severe MR.  07/20/22: LVEF 45-50%, moderate MR.   CMR 12/19/22:  .  Severe LV dilatation with moderate systolic dysfunction (EF 31%)  2.  Normal RV size and systolic function (EF 54%)  3.  No late gadolinium enhancement to suggest myocardial scar  4.  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  NYHA class / AHA Stage:III Volume status & Diuretics: lasix 20mg  daily; will switch to every other day.  Vasodilators:will start at follow up Beta-Blocker:start digoxin daily. Repeat labs in 10 days.  XBJ:[id number] 12.5mg  daily Cardiometabolic: farxiga 10mg   Devices therapies & Valvulopathies:not currently  indicated Advanced therapies:Plan on CPX in the near future.   2.  Moderate mitral regurgitation -Previously severe, improving with GDMT.  Cardiac MRI in July 2024 with moderate MR (regurgitant fraction 24%)  3. Palpitations - Improving;    Mikeisha Lemonds Advanced Heart Failure Mechanical Circulatory Support

## 2023-01-24 ENCOUNTER — Ambulatory Visit (HOSPITAL_COMMUNITY)
Admission: RE | Admit: 2023-01-24 | Discharge: 2023-01-24 | Disposition: A | Discharge status: Home / Self Care | Payer: Medicaid Other | Source: Ambulatory Visit | Attending: Cardiology | Admitting: Cardiology

## 2023-01-24 ENCOUNTER — Encounter (HOSPITAL_COMMUNITY): Payer: Self-pay | Admitting: Cardiology

## 2023-01-24 VITALS — BP 114/72 | HR 70 | Wt 126.0 lb

## 2023-01-24 DIAGNOSIS — Z8759 Personal history of other complications of pregnancy, childbirth and the puerperium: Secondary | ICD-10-CM

## 2023-01-24 DIAGNOSIS — I340 Nonrheumatic mitral (valve) insufficiency: Secondary | ICD-10-CM

## 2023-01-24 DIAGNOSIS — I429 Cardiomyopathy, unspecified: Secondary | ICD-10-CM | POA: Diagnosis not present

## 2023-01-24 DIAGNOSIS — R002 Palpitations: Secondary | ICD-10-CM | POA: Insufficient documentation

## 2023-01-24 DIAGNOSIS — I5020 Unspecified systolic (congestive) heart failure: Secondary | ICD-10-CM | POA: Insufficient documentation

## 2023-01-24 DIAGNOSIS — Z87440 Personal history of urinary (tract) infections: Secondary | ICD-10-CM | POA: Insufficient documentation

## 2023-01-24 DIAGNOSIS — I5022 Chronic systolic (congestive) heart failure: Secondary | ICD-10-CM | POA: Diagnosis not present

## 2023-01-24 DIAGNOSIS — I428 Other cardiomyopathies: Secondary | ICD-10-CM | POA: Diagnosis not present

## 2023-01-24 DIAGNOSIS — O9943 Diseases of the circulatory system complicating the puerperium: Secondary | ICD-10-CM | POA: Insufficient documentation

## 2023-01-24 DIAGNOSIS — O1490 Unspecified pre-eclampsia, unspecified trimester: Secondary | ICD-10-CM | POA: Diagnosis not present

## 2023-01-24 DIAGNOSIS — Z79899 Other long term (current) drug therapy: Secondary | ICD-10-CM | POA: Insufficient documentation

## 2023-01-24 DIAGNOSIS — N390 Urinary tract infection, site not specified: Secondary | ICD-10-CM | POA: Insufficient documentation

## 2023-01-24 LAB — BASIC METABOLIC PANEL
Anion gap: 7 (ref 5–15)
BUN: 13 mg/dL (ref 6–20)
CO2: 25 mmol/L (ref 22–32)
Calcium: 9.1 mg/dL (ref 8.9–10.3)
Chloride: 104 mmol/L (ref 98–111)
Creatinine, Ser: 0.71 mg/dL (ref 0.44–1.00)
GFR, Estimated: >60 mL/min (ref >60)
Glucose, Bld: 96 mg/dL (ref 70–99)
Potassium: 4.1 mmol/L (ref 3.5–5.1)
Sodium: 136 mmol/L (ref 135–145)

## 2023-01-24 LAB — BRAIN NATRIURETIC PEPTIDE: B Natriuretic Peptide: 37.8 pg/mL (ref 0.0–100.0)

## 2023-01-24 LAB — DIGOXIN LEVEL: Digoxin Level: 0.6 ng/mL — ABNORMAL LOW (ref 0.8–2.0)

## 2023-01-24 MED ORDER — FUROSEMIDE 20 MG PO TABS: 20.0000 mg | ORAL_TABLET | Freq: Every day | ORAL | Status: DC | PRN

## 2023-01-24 MED ORDER — LOSARTAN POTASSIUM 25 MG PO TABS: 12.5000 mg | ORAL_TABLET | Freq: Every day | ORAL | 3 refills | Status: DC

## 2023-01-24 MED ORDER — NITROFURANTOIN MONOHYD MACRO 100 MG PO CAPS: 100.0000 mg | ORAL_CAPSULE | Freq: Two times a day (BID) | ORAL | 1 refills | Status: DC

## 2023-01-24 NOTE — Patient Instructions (Addendum)
Medication Changes:  HOLD FARXIGA   START: LOSARTAN 12.5MG  ONCE DAILY   CAN TAKE YOUR LASIX (FUROSEMIDE) 20MG  ONLY IF YOU NEED IT FOR WEIGHT GAIN (3 POUNDS OVER NIGHT- OR 5 POUND IN 1 WEEK) OR FOR SHORTNESS OF BREATH OR SWELLING  Lab Work:  Labs done today, your results will be available in MyChart, we will contact you for abnormal readings.  Testing/Procedures:  Expect to be in the lab for 2 hours. Please plan to arrive 30 minutes prior to your appointment. You may be asked to reschedule your test if you arrive 20 minutes or more after your scheduled appointment time.  Main Campus address: [address] [place], [place] [address] You may arrive to the Main Entrance A or Entrance C (free valet parking is available at both). -Main Entrance A (on [address]) :proceed to admitting for check in -Entrance C (on [facility]): proceed to [employer] parking or under hospital deck parking using this code _________  Check In: [facility] waiting room (1st floor)   General Instructions for the day of the test (Please follow all instructions from your physician): Refrain from ingesting a heavy meal, alcohol, or caffeine or using tobacco products within 2 hours of the test (DO NOT FAST for mare than 8 hours). You may have all other non-alcoholic, non -caffeinated beverage,a light snack (crackers,a piece of fruit, carrot sticks, toast bagel,etc) up to your appointment. Avoid significant exertion or exercise within 24 hours of your test. Be prepared to exercise and sweat. Your clothing should permit freedom of movement and include walking or running shoes. Women bring loose fitting short sleeved blouse.  This evaluation may be fatiguing and you may wish ti have someone accompany you to the assessment to drive you home afterward. Bring a list of your medications with you, including dosage and frequency you take the medications (  I.e.,once per day, twice per day, etc). Take all medications  as prescribed, unless noted below or instructed to do so by your physician.  Please do not take the following medications prior to your CPX:  _________________________________________________  _________________________________________________  Brief description of the test: A brief lung test will be performed. This will involve you taking deep breaths and blowing hard and fast through your mouth. During these , a clip will be on your nose and you will be breathing through a breathing device.   For the exercise portion of the test you will be walking on a treadmill, or riding a stationary bike, to your maximal effor or until symptoms such as chest pain, shortness of breath, leg pain or dizziness limit your exercise. You will be breathing in and out of a breathing device through your mouth (a clip will be on your nose again). Your heart rate, ECG, blood pressure, oxygen saturations, breathing rate and depth, amount of oxygen you consume and amount of carbon dioxide you produce will be measured and monitored throughout the exercise test.  If you need to cancel or reschedule your appointment please call [phone] If you have further questions please call your physician or Philip Aspen, MS, ACSM-RCEP at [phone]   Follow-Up in: 1 month as scheduled with Dr. Gasper Lloyd   At the [facility], you and your health needs are our priority. We have a designated team specialized in the treatment of Heart Failure. This Care Team includes your primary Heart Failure Specialized Cardiologist (physician), Advanced Practice Providers (APPs- Physician Assistants and Nurse Practitioners), and Pharmacist who all work  together to provide you with the care you need, when you need it.   You may see any of the following providers on your designated Care Team at your next follow up:  Dr. Arvilla Meres Dr. Marca Ancona Dr. Marcos Eke, NP Robbie Lis, [place] [facility] [place], [place] Brynda Peon, NP Karle Plumber, PharmD   Please be sure to bring in all your medications bottles to every appointment.   Need to Contact us:  If you have any questions or concerns before your next appointment please send [place] a message through [place] or call our office at [phone].    TO LEAVE A MESSAGE FOR THE NURSE SELECT OPTION 2, PLEASE LEAVE A MESSAGE INCLUDING: YOUR NAME DATE OF BIRTH CALL BACK NUMBER REASON FOR CALL**this is important as we prioritize the call backs  YOU WILL RECEIVE A CALL BACK THE SAME DAY AS LONG AS YOU CALL BEFORE 4:00 PM

## 2023-01-24 NOTE — Addendum Note (Signed)
Addended by: Catalina Antigua on: 01/24/2023 12:24 PM   Modules accepted: Orders

## 2023-02-07 ENCOUNTER — Telehealth (HOSPITAL_COMMUNITY): Payer: Self-pay | Admitting: *Deleted

## 2023-02-07 NOTE — Telephone Encounter (Signed)
Left voicemail to remind patient of CPX appointment on Wednesday, 8/28 at 9am. Provided patient with call back [phone] if cancellation/reschedule is necessary.       Reggy Eye, MS, ACSM, [facility] Clinical Exercise Physiologist/ Health and Wellness Coach

## 2023-02-13 ENCOUNTER — Other Ambulatory Visit (HOSPITAL_COMMUNITY): Payer: Self-pay | Admitting: *Deleted

## 2023-02-13 ENCOUNTER — Ambulatory Visit (HOSPITAL_COMMUNITY): Payer: Medicaid Other | Attending: Internal Medicine

## 2023-02-13 DIAGNOSIS — I340 Nonrheumatic mitral (valve) insufficiency: Secondary | ICD-10-CM | POA: Insufficient documentation

## 2023-02-13 DIAGNOSIS — R0609 Other forms of dyspnea: Secondary | ICD-10-CM | POA: Diagnosis not present

## 2023-02-13 DIAGNOSIS — M25571 Pain in right ankle and joints of right foot: Secondary | ICD-10-CM | POA: Insufficient documentation

## 2023-02-13 DIAGNOSIS — I5022 Chronic systolic (congestive) heart failure: Secondary | ICD-10-CM | POA: Diagnosis not present

## 2023-02-13 DIAGNOSIS — O2300 Infections of kidney in pregnancy, unspecified trimester: Secondary | ICD-10-CM | POA: Diagnosis present

## 2023-02-13 DIAGNOSIS — M25561 Pain in right knee: Secondary | ICD-10-CM | POA: Diagnosis not present

## 2023-02-13 DIAGNOSIS — I5020 Unspecified systolic (congestive) heart failure: Secondary | ICD-10-CM

## 2023-02-13 DIAGNOSIS — K651 Peritoneal abscess: Secondary | ICD-10-CM | POA: Diagnosis present

## 2023-02-13 DIAGNOSIS — R42 Dizziness and giddiness: Secondary | ICD-10-CM | POA: Insufficient documentation

## 2023-02-27 ENCOUNTER — Encounter: Payer: Self-pay | Admitting: Obstetrics and Gynecology

## 2023-03-02 ENCOUNTER — Encounter (HOSPITAL_COMMUNITY): Payer: Self-pay | Admitting: Cardiology

## 2023-03-03 NOTE — Progress Notes (Signed)
ADVANCED HEART FAILURE CLINIC NOTE  Referring Physician: No ref. provider found  Primary Care: Patient, No Pcp Per Primary Cardiologist: Laurance Flatten, MD HF: Dorthula Nettles, DO  HPI: [name] is a 37 y.o. female with heart failure with reduced ejection fraction presenting today to establish care.  Her cardiac history dates back to November 2023 when she was admitted with pyelonephritis.  At that time she was noted to be tachycardic with inferolateral T wave inversions on ECG.  She had a follow-up echocardiogram demonstrating EF of 40%, apical lateral hypokinesis and moderate to severe mitral regurgitation.  She was started on Toprol 12.5 mg daily and since that time has been followed by Laurance Flatten.  She had a repeat echocardiogram in 2/24 with a EF of 45 to 50% and moderate MR.  In March 2024 she was admitted with severe preeclampsia and active labor.  She underwent C-section at that time with echocardiogram demonstrating EF of 30 to 35%.  Unfortunately once more readmitted in April 2024 for sepsis secondary to intra-abdominal abscess requiring IV antibiotics and IR drainage.  Most recently underwent cardiac MRI in July 24 with a EF of 31%, no LGE and moderate mitral regurgitation.  Interval hx:  Continues to become quickly fatigued; unable to complete trips to the grocery store due to fatigue. No LE edema, orthopnea or PND.   Activity level/exercise tolerance:  NYHA III Orthopnea:  Sleeps on 2 pillows Paroxysmal noctural dyspnea:  No Chest pain/pressure:  No Orthostatic lightheadedness:  Yes, infrequent Palpitations:  No Lower extremity edema:  No Presyncope/syncope:  No Cough:  yes  Past Medical History:  Diagnosis Date   Bacterial infection    History of chicken pox    Intra-abdominal abscess (HCC) 09/26/2022   Mitral valve insufficiency    Pyelonephritis affecting pregnancy in second trimester 04/22/2022   Admitted 11/5   Systolic heart failure (HCC)      Current Outpatient Medications  Medication Sig Dispense Refill   digoxin (LANOXIN) 0.125 MG tablet Take 1 tablet (0.125 mg total) by mouth daily. 90 tablet 3   Drospirenone (SLYND) 4 MG TABS Take 1 tablet (4 mg total) by mouth daily. 90 tablet 3   furosemide (LASIX) 20 MG tablet Take 1 tablet (20 mg total) by mouth daily as needed for fluid or edema.     losartan (COZAAR) 25 MG tablet Take 0.5 tablets (12.5 mg total) by mouth daily. 45 tablet 3   metoprolol succinate (TOPROL XL) 25 MG 24 hr tablet Take 0.5 tablets (12.5 mg total) by mouth daily.     nitrofurantoin, macrocrystal-monohydrate, (MACROBID) 100 MG capsule Take 1 capsule (100 mg total) by mouth 2 (two) times daily. 14 capsule 1   spironolactone (ALDACTONE) 25 MG tablet Take 0.5 tablets (12.5 mg total) by mouth daily. 45 tablet 3   No current facility-administered medications for this visit.    Allergies  Allergen Reactions   Other Other (See Comments)   Penicillins Rash      Social History   Socioeconomic History   Marital status: Single    Spouse name: Not on file   Number of children: Not on file   Years of education: Not on file   Highest education level: Not on file  Occupational History   Not on file  Tobacco Use   Smoking status: Never   Smokeless tobacco: Never  Vaping Use   Vaping status: Never Used  Substance and Sexual Activity   Alcohol use: No   Drug use:  No   Sexual activity: Not Currently    Birth control/protection: None  Other Topics Concern   Not on file  Social History Narrative   Not on file   Social Determinants of Health   Financial Resource Strain: Not on file  Food Insecurity: No Food Insecurity (09/26/2022)   Hunger Vital Sign    Worried About Running Out of Food in the Last Year: Never true    Ran Out of Food in the Last Year: Never true  Transportation Needs: No Transportation Needs (09/26/2022)   PRAPARE - Administrator, Civil Service (Medical): No    Lack of  Transportation (Non-Medical): No  Physical Activity: Not on file  Stress: Not on file  Social Connections: Not on file  Intimate Partner Violence: Not At Risk (09/26/2022)   Humiliation, Afraid, Rape, and Kick questionnaire    Fear of Current or Ex-Partner: No    Emotionally Abused: No    Physically Abused: No    Sexually Abused: No      Family History  Problem Relation Age of Onset   Hypertension Mother    Hypertension Father    Diabetes Father    Hypertension Paternal Aunt    Asthma Neg Hx    Cancer Neg Hx    Heart disease Neg Hx    Stroke Neg Hx     PHYSICAL EXAM: There were no vitals filed for this visit. GENERAL: Well nourished, well developed, and in no apparent distress at rest.  HEENT: Negative for arcus senilis or xanthelasma. There is no scleral icterus.  The mucous membranes are pink and moist.   NECK: Supple, No masses. Normal carotid upstrokes without bruits. No masses or thyromegaly.    CHEST: There are no chest wall deformities. There is no chest wall tenderness. Respirations are unlabored.  Lungs- *** CARDIAC:  JVP: *** cm          Normal rate with regular rhythm. No murmurs, rubs or gallops.  Pulses are 2+ and symmetrical in upper and lower extremities. *** edema.  ABDOMEN: Soft, non-tender, non-distended. There are no masses or hepatomegaly. There are normal bowel sounds.  EXTREMITIES: Warm and well perfused with no cyanosis, clubbing.  LYMPHATIC: No axillary or supraclavicular lymphadenopathy.  NEUROLOGIC: Patient is oriented x3 with no focal or lateralizing neurologic deficits.  PSYCH: Patients affect is appropriate, there is no evidence of anxiety or depression.  SKIN: Warm and dry; no lesions or wounds.      DATA REVIEW  ECG: 09/12/22: sinus tachycardia  as per my interpretation  ECHO: 09/12/22: LVEF 30-35%, normal RV function as per my  interpretation 09/07/22: LVEF 35%, severe MR.  07/20/22: LVEF 45-50%, moderate MR.   CMR 12/19/22:  .  Severe LV  dilatation with moderate systolic dysfunction (EF 31%)  2.  Normal RV size and systolic function (EF 54%)  3.  No late gadolinium enhancement to suggest myocardial scar  4.  Moderate mitral regurgitation (regurgitant fraction 24%)  CPX 02/20/23:  Exercise testing with gas exchange demonstrates a severely reduced peak VO2 of 17.6 ml/kg/min (54% of the age/gender/weight matched sedentary norms). The RER of 1.18 indicates a maximal effort. The VE/VCO2 slope is mildly elevated and indicates increased dead space ventilation. The oxygen uptake efficiency slope (OUES) is normal. The VO2 at the ventilatory threshold was normal at 44% of the predicted peak VO2. At peak exercise, the ventilation reached 51% of the measured MVV indicating ventilatory reserve remained. The O2pulse (a surrogate for stroke  volume) increased with initial exercise, reaching early flattened and blunted peak at 7 ml/beat (70% predicted).   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  NYHA class / AHA Stage:III Volume status & Diuretics: lasix 20mg  M & F; will decrease to PRN Vasodilators:start losartan 12.5mg  at bedtime; I have told her to stop the medication and notify me if she has any symptomatic hypotension. Beta-Blocker:start digoxin daily. Repeat digoxin level today.  WUJ:[id number] 12.5mg  daily Cardiometabolic: currently holding farxiga due to recurrent UTI Devices therapies & Valvulopathies:not currently indicated Advanced therapies: CPX with moderate to severe HF limitation with VE/VCO2 slope of 31, Peak VO2 of 17.6 (54% of predicted). Will schedule RHC.   2.  Moderate mitral regurgitation -Previously severe, improving with GDMT.  Cardiac MRI in July 2024 with moderate MR (regurgitant fraction 24%)  3. Palpitations - Improving;   4. 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 Vito Berger Advanced Heart Failure Mechanical Circulatory Support

## 2023-03-04 ENCOUNTER — Ambulatory Visit (HOSPITAL_COMMUNITY)
Admission: RE | Admit: 2023-03-04 | Discharge: 2023-03-04 | Disposition: A | Discharge status: Home / Self Care | Payer: Medicaid Other | Source: Ambulatory Visit | Attending: Cardiology | Admitting: Cardiology

## 2023-03-04 ENCOUNTER — Other Ambulatory Visit (HOSPITAL_COMMUNITY): Payer: Self-pay

## 2023-03-04 ENCOUNTER — Encounter (HOSPITAL_COMMUNITY): Payer: Self-pay

## 2023-03-04 ENCOUNTER — Encounter (HOSPITAL_COMMUNITY): Payer: Self-pay | Admitting: Cardiology

## 2023-03-04 VITALS — BP 120/80 | HR 81 | Wt 128.4 lb

## 2023-03-04 DIAGNOSIS — Z79899 Other long term (current) drug therapy: Secondary | ICD-10-CM | POA: Diagnosis not present

## 2023-03-04 DIAGNOSIS — O1490 Unspecified pre-eclampsia, unspecified trimester: Secondary | ICD-10-CM | POA: Diagnosis not present

## 2023-03-04 DIAGNOSIS — R002 Palpitations: Secondary | ICD-10-CM | POA: Insufficient documentation

## 2023-03-04 DIAGNOSIS — I5020 Unspecified systolic (congestive) heart failure: Secondary | ICD-10-CM | POA: Diagnosis present

## 2023-03-04 DIAGNOSIS — Z87440 Personal history of urinary (tract) infections: Secondary | ICD-10-CM | POA: Diagnosis not present

## 2023-03-04 DIAGNOSIS — I340 Nonrheumatic mitral (valve) insufficiency: Secondary | ICD-10-CM | POA: Diagnosis not present

## 2023-03-04 DIAGNOSIS — I428 Other cardiomyopathies: Secondary | ICD-10-CM | POA: Diagnosis not present

## 2023-03-04 DIAGNOSIS — I5022 Chronic systolic (congestive) heart failure: Secondary | ICD-10-CM | POA: Diagnosis not present

## 2023-03-04 DIAGNOSIS — O903 Peripartum cardiomyopathy: Secondary | ICD-10-CM | POA: Diagnosis not present

## 2023-03-04 LAB — BASIC METABOLIC PANEL
Anion gap: 6 (ref 5–15)
BUN: 11 mg/dL (ref 6–20)
CO2: 26 mmol/L (ref 22–32)
Calcium: 8.5 mg/dL — ABNORMAL LOW (ref 8.9–10.3)
Chloride: 104 mmol/L (ref 98–111)
Creatinine, Ser: 0.63 mg/dL (ref 0.44–1.00)
GFR, Estimated: >60 mL/min (ref >60)
Glucose, Bld: 93 mg/dL (ref 70–99)
Potassium: 3.9 mmol/L (ref 3.5–5.1)
Sodium: 136 mmol/L (ref 135–145)

## 2023-03-04 LAB — DIGOXIN LEVEL: Digoxin Level: 0.4 ng/mL — ABNORMAL LOW (ref 0.8–2.0)

## 2023-03-04 LAB — BRAIN NATRIURETIC PEPTIDE: B Natriuretic Peptide: 57.3 pg/mL (ref 0.0–100.0)

## 2023-03-04 MED ORDER — DAPAGLIFLOZIN PROPANEDIOL 10 MG PO TABS: 10.0000 mg | ORAL_TABLET | Freq: Every day | ORAL | 5 refills | Status: DC

## 2023-03-04 MED ORDER — LOSARTAN POTASSIUM 25 MG PO TABS: 25.0000 mg | ORAL_TABLET | Freq: Every day | ORAL | 3 refills | Status: DC

## 2023-03-04 NOTE — Patient Instructions (Addendum)
Medication Changes:  INCREASE LOSARTAN TO 25MG  ONCE DAILY   START: FARXIGA 10MG  ONCE DAILY- PLEASE STOP IF YOU HAVE ISSUES WITH URINARY TRACT INFECTIONS  PLEASE CALL MEDICAID AND MAKE THEM AWARE THAT YOU DO NOT HAVE OTHER COVERAGE - [phone] IN ORDER FOR THEM TO CONTINUE FILLING YOUR MEDICATIONS  Lab Work:  Labs done today, your results will be available in MyChart, we will contact you for abnormal readings.  Follow-Up in: 3 WEEKS WITH DR. Gasper Lloyd OR PHARMACY AS SCHEDULED   At the [facility], you and your health needs are our priority. We have a designated team specialized in the treatment of Heart Failure. This Care Team includes your primary Heart Failure Specialized Cardiologist (physician), Advanced Practice Providers (APPs- Physician Assistants and Nurse Practitioners), and Pharmacist who all work together to provide you with the care you need, when you need it.   You may see any of the following providers on your designated Care Team at your next follow up:  Dr. Arvilla Meres Dr. Marca Ancona Dr. Marcos Eke, NP Robbie Lis, [place] [facility] [place], [place] Brynda Peon, NP Karle Plumber, PharmD   Please be sure to bring in all your medications bottles to every appointment.   Need to Contact us:  If you have any questions or concerns before your next appointment please send [place] a message through [place] or call our office at [phone].    TO LEAVE A MESSAGE FOR THE NURSE SELECT OPTION 2, PLEASE LEAVE A MESSAGE INCLUDING: YOUR NAME DATE OF BIRTH CALL BACK NUMBER REASON FOR CALL**this is important as we prioritize the call backs  YOU WILL RECEIVE A CALL BACK THE SAME DAY AS LONG AS YOU CALL BEFORE 4:00 PM

## 2023-03-13 ENCOUNTER — Other Ambulatory Visit (HOSPITAL_COMMUNITY): Payer: Self-pay

## 2023-03-13 ENCOUNTER — Encounter (HOSPITAL_COMMUNITY): Payer: Self-pay

## 2023-03-13 NOTE — Telephone Encounter (Signed)
Advanced Heart Failure Patient Advocate Encounter  Received notification that prior [name] is needed. Review of the patient chart shows that Medicaid is still rejecting under 'bill to primary insurance'. Spoke to patient by phone, she did call on 9/24 to request system update. Will continue to follow up and provide samples for patient to get through until this issue can be resolved.

## 2023-03-14 NOTE — Telephone Encounter (Signed)
Triage walk in Pt requests farxiga samples  Medication Samples have been provided to the patient.  Drug name: farixga       Strength: 10 ,g        Qty: 28  LOT: [id number]  Exp.Date: 09/15/25  Dosing instructions: one tab daily   The patient has been instructed regarding the correct time, dose, and frequency of taking this medication, including desired effects and most common side effects.   [name] 4:51 PM 03/14/2023   [name] co pay/savings done

## 2023-03-18 ENCOUNTER — Other Ambulatory Visit (HOSPITAL_COMMUNITY): Payer: Self-pay

## 2023-03-18 ENCOUNTER — Telehealth (HOSPITAL_COMMUNITY): Payer: Self-pay

## 2023-03-18 NOTE — Telephone Encounter (Signed)
Advanced Heart Failure Patient Advocate Encounter  Prior authorization for [name] has been submitted and approved. Test billing returns $0 for 90 day supply.  Key: [id number] Effective: 03/18/2023 to 03/17/2024  Burnell Blanks, CPhT Rx Patient Advocate Phone: [phone]

## 2023-03-18 NOTE — Telephone Encounter (Signed)
Prior authorization has been approved, test billing returns $0 copay. Called patient to inform, advised her to contact me if there are future issues or concerns with medications.

## 2023-03-19 ENCOUNTER — Other Ambulatory Visit (HOSPITAL_COMMUNITY): Payer: Self-pay

## 2023-03-20 ENCOUNTER — Other Ambulatory Visit (HOSPITAL_COMMUNITY): Payer: Self-pay | Admitting: Cardiology

## 2023-03-20 ENCOUNTER — Other Ambulatory Visit (HOSPITAL_COMMUNITY): Payer: Self-pay

## 2023-03-20 ENCOUNTER — Other Ambulatory Visit: Payer: Self-pay

## 2023-03-20 MED ORDER — METOPROLOL SUCCINATE ER 25 MG PO TB24
12.5000 mg | ORAL_TABLET | Freq: Every day | ORAL | 6 refills | Status: DC
  Filled 2023-03-20 (×3): qty 15, 30d supply, fill #0

## 2023-03-22 NOTE — Progress Notes (Incomplete)
***In Progress***    Advanced Heart Failure Clinic Note  Referring Physician: No ref. provider found  Primary Care: Patient, No Pcp Per Primary Cardiologist: Laurance Flatten, MD HF Cardiologist: Dorthula Nettles, DO  HPI:  [name] is a 37 y.o. female with heart failure with reduced ejection fraction. Her cardiac history dates back to November 2023 when she was admitted with pyelonephritis. At that time, she was noted to be tachycardic with inferolateral T wave inversions on ECG. She had a follow-up echocardiogram demonstrating EF of 40%, apical lateral hypokinesis and moderate to severe mitral regurgitation.  She was started on Toprol 12.5 mg daily and since that time has been followed by Laurance Flatten. She had a repeat echocardiogram in 07/2022 with a EF of 45 to 50% and moderate MR. In March 2024 she was admitted with severe preeclampsia and active labor. She underwent C-section at that time with echocardiogram demonstrating EF of 30 to 35%. Unfortunately once more readmitted in April 2024 for sepsis secondary to intra-abdominal abscess requiring IV antibiotics and IR drainage.  Most recently underwent cardiac MRI in July 2024 with a EF of 31%, no LGE and moderate mitral regurgitation.   Interval hx:  - Since our last appointment, she has had some improvement in functional class. She is able to walk slightly further and has noticed improvement in her sleep.    Activity level/exercise tolerance:  NYHA III Orthopnea:  Sleeps on 2 pillows Paroxysmal noctural dyspnea:  No Chest pain/pressure:  No Orthostatic lightheadedness:  Yes, infrequent Palpitations:  No Lower extremity edema:  No Presyncope/syncope:  No Cough:  yes   Today he returns to HF clinic for pharmacist medication titration. At last visit with MD ***.   Shortness of breath/dyspnea on exertion? {YES NO:22349}  Orthopnea/PND? {YES NO:22349} Edema? {YES NO:22349} Lightheadedness/dizziness? {YES NO:22349} Daily  weights at home? {YES NO:22349} Blood pressure/heart rate monitoring at home? {YES [id number] Following low-sodium/fluid-restricted diet? {YES NO:22349}  HF Medications: Metoprolol succinate 12.5 mg daily Losartan 25 mg daily Spironolactone 12.5 mg daily Farxiga 10 mg daily Lasix 20 mg daily Digoxin 125 mcg daily  Has the patient been experiencing any side effects to the medications prescribed?  {YES NO:22349}  Does the patient have any problems obtaining medications due to transportation or finances?   {YES NO:22349}  Understanding of regimen: {excellent/good/fair/poor:19665} Understanding of indications: {excellent/good/fair/poor:19665} Potential of compliance: {excellent/good/fair/poor:19665} Patient understands to avoid NSAIDs. Patient understands to avoid decongestants.    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  NYHA class / AHA Stage:III Beta-Blocker: continue metoprolol 12.5mg  daily & digoxin ; repeat levels today.  Vasodilators: increase losartan to 25mg  daily.  NWG:[id number] 12.5mg  daily Cardiometabolic: she has recurrent UTIs; however, we will try [place] once more.  Volume status & Diuretics: lasix 20mg  as needed. Devices therapies & Valvulopathies:not currently indicated Advanced therapies: CPX with moderate to severe HF limitation with VE/VCO2 slope of 31, Peak VO2 of 17.6 (54% of predicted). Will uptitrate GDMT with low threshold for CPX. She has had mild improvement in functional status and sleep over the past 2 weeks.    2.  Moderate mitral regurgitation -Previously severe, improving with GDMT.  Cardiac  MRI in July 2024 with moderate MR (regurgitant fraction  24%)   3. Palpitations - Improving;    4. Peripartum cardiomyopathy  - Now on SLYND; followed by Dr. Berton Lan   Follow up ***   Karle Plumber, PharmD, BCPS, BCCP, CPP Heart Failure Clinic Pharmacist [phone]

## 2023-03-25 ENCOUNTER — Telehealth: Payer: Self-pay | Admitting: Cardiology

## 2023-03-25 ENCOUNTER — Ambulatory Visit (HOSPITAL_COMMUNITY)
Admission: RE | Admit: 2023-03-25 | Discharge: 2023-03-25 | Disposition: A | Discharge status: Home / Self Care | Payer: Medicaid Other | Source: Ambulatory Visit | Attending: Cardiology | Admitting: Cardiology

## 2023-03-25 VITALS — BP 116/80 | HR 73 | Wt 125.8 lb

## 2023-03-25 DIAGNOSIS — I5020 Unspecified systolic (congestive) heart failure: Secondary | ICD-10-CM | POA: Diagnosis present

## 2023-03-25 DIAGNOSIS — I5022 Chronic systolic (congestive) heart failure: Secondary | ICD-10-CM | POA: Diagnosis not present

## 2023-03-25 DIAGNOSIS — Z79899 Other long term (current) drug therapy: Secondary | ICD-10-CM | POA: Diagnosis not present

## 2023-03-25 DIAGNOSIS — I340 Nonrheumatic mitral (valve) insufficiency: Secondary | ICD-10-CM | POA: Insufficient documentation

## 2023-03-25 DIAGNOSIS — R002 Palpitations: Secondary | ICD-10-CM | POA: Diagnosis not present

## 2023-03-25 DIAGNOSIS — I428 Other cardiomyopathies: Secondary | ICD-10-CM | POA: Insufficient documentation

## 2023-03-25 MED ORDER — SPIRONOLACTONE 25 MG PO TABS: 25.0000 mg | ORAL_TABLET | Freq: Every day | ORAL | 3 refills | Status: DC

## 2023-03-25 NOTE — Telephone Encounter (Signed)
[facility]  [facility] denial dept called in about pt's MRI from July. She states they never received auth for it and would like a c/b to discuss. She also stated the insurance company faxed a document over about it.    Fax: [phone]

## 2023-03-25 NOTE — Patient Instructions (Signed)
It was a pleasure seeing you today!  MEDICATIONS: -We are changing your medications today -Increase spironolactone to 25 mg (1 tablet) daily. -Call if you have questions about your medications.   NEXT APPOINTMENT: Return to clinic in 3 weeks with Pharmacy Clinic.  In general, to take care of your heart failure: -Limit your fluid intake to 2 Liters (half-gallon) per day.   -Limit your salt intake to ideally 2-3 grams (2000-3000 mg) per day. -Weigh yourself daily and record, and bring that "weight diary" to your next appointment.  (Weight gain of 2-3 pounds in 1 day typically means fluid weight.) -The medications for your heart are to help your heart and help you live longer.   -Please contact us before stopping any of your heart medications.  Call the clinic at [phone] with questions or to reschedule future appointments.

## 2023-03-25 NOTE — Progress Notes (Signed)
Advanced Heart Failure Clinic Note   Referring Physician: No ref. provider found  Primary Care: Patient, No Pcp Per Primary Cardiologist: Laurance Flatten, MD HF Cardiologist: Dorthula Nettles, DO  HPI:  [name] is a 37 y.o. female with heart failure with reduced ejection fraction. Her cardiac history dates back to November 2023 when she was admitted with pyelonephritis. At that time, she was noted to be tachycardic with inferolateral T wave inversions on ECG. She had a follow-up echocardiogram demonstrating EF of 40%, apical lateral hypokinesis and moderate to severe mitral regurgitation.  She had a repeat echocardiogram in 07/2022 with a EF of 45 to 50% and moderate MR. In March 2024, she was admitted with severe preeclampsia and active labor. She underwent C-section at that time with echocardiogram demonstrating EF of 30 to 35%. Unfortunately, she was once more readmitted in April 2024 for sepsis secondary to intra-abdominal abscess requiring IV antibiotics and IR drainage.  Most recently underwent cardiac MRI in July 2024 with a EF of 31%, no LGE and moderate mitral regurgitation.   Recently presented to AHF clinic on 03/04/23 and noted she had some improvement in functional class. She was able to walk slightly further and has noticed improvement in her sleep. Noted she had lightheadedness but these events were infrequent.   Today, she returns to HF clinic for pharmacist medication titration. At last visit with MD, losartan was increased to 25 mg daily and Farxiga 10 mg daily was reinitiated. Overall feeling okay with ups and downs. Some mornings she feels sluggish and lightheaded and believes it's related to how well she sleeps. Since restarting Farxiga, she has not been sleeping well, leading to more sluggishness and lightheadedness, and complains of urinary urgency and burning sensation when she urinates. She decided to discontinue [place] three days ago and feels a bit better now.  Has less lightheadedness and urinary symptoms have resolved. She reports a decrease in appetite that has worsened since last visit. Has occasional episodes of SOB where she will have difficulty catching her breath. This occurs randomly (even sometimes with sitting) and only lasts a few seconds.  Often feels fatigue due to busy schedule with kids. She does not weigh herself daily at home. She reports feeling bloated after drinking water and takes Lasix 20 mg PRN at night when she feels abdominal bloating, usually ~1-2 times per week. She took a dose last night. No LEE, PND or orthopnea.  She has been trying to follow a low salt diet. She mainly takes BP at home if she does not feel well. Her SBP during episodes of lightheadedness is ~112. Her SBP is 118-120 most days.    HF Medications: Metoprolol succinate 12.5 mg daily Losartan 25 mg daily Spironolactone 12.5 mg daily Farxiga 10 mg daily Digoxin 0.125 mg daily Lasix 20 mg PRN  Has the patient been experiencing any side effects to the medications prescribed?  Yes - [name] causes her to not sleep well, feel sluggish/lightheaded, and have UTI symptoms (urgency, burning sensation)  Does the patient have any problems obtaining medications due to transportation or finances?  No - [place] Medicaid  Understanding of regimen: good Understanding of indications: good Potential of compliance: good Patient understands to avoid NSAIDs. Patient understands to avoid decongestants.    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  NYHA class / AHA Stage:III Volume status & Diuretics: Volume status  stable, continue Lasix 20 mg as needed. Encouraged her to take in the morning to avoid waking up at night to urinate.  Beta-Blocker: continue metoprolol succinate 12.5 mg daily Vasodilators: continue losartan 25 mg daily.  MRA: increase spironolactone to 25 mg daily Cardiometabolic: Did not tolerate Farxiga due to feelings of sluggishness, lightheadedness and UTI symptoms.  Digitalis glycosides: continue digoxin 0.125 mg daily  Advanced therapies: CPX with moderate to severe HF limitation with VE/VCO2 slope of 31, Peak VO2 of 17.6 (54% of predicted).    2.  Moderate mitral regurgitation -Previously severe, improving with GDMT. Cardiac MRI in July 2024 with moderate MR (regurgitant fraction 24%)   3. Palpitations - Improving; HR 73   4. Peripartum cardiomyopathy  - Prescribed SLYND but has not started yet; followed by Dr. Berton Lan  Follow up with pharmacist at HF clinic in 3 weeks.   Karle Plumber, PharmD, BCPS, BCCP, CPP Heart Failure Clinic Pharmacist [phone]

## 2023-04-01 ENCOUNTER — Ambulatory Visit: Payer: Medicaid Other | Admitting: Obstetrics & Gynecology

## 2023-04-01 VITALS — BP 109/75 | HR 83 | Wt 122.0 lb

## 2023-04-01 DIAGNOSIS — R1084 Generalized abdominal pain: Secondary | ICD-10-CM

## 2023-04-01 NOTE — Progress Notes (Addendum)
GYNECOLOGY OFFICE VISIT NOTE  History:   [name] is a 37 y.o. [phone] with history heart failure with reduced ejection fraction here today for evaluation of generalized abdominal and pelvic pain.  She underwent a repeat cesarean section on 09/11/22 complicated by readmission in April 2024 for sepsis secondary to intra-abdominal abscess requiring IV antibiotics and IR drainage.   She has a lot of pain, most of the time and feels that something is "not right".  She said she felt this way after when she had the infection, and was initially told nothing was wrong.  She desires imaging of her abdomen and pelvis. Denies any other general symptoms except for pain.    Past Medical History:  Diagnosis Date   Bacterial infection    History of chicken pox    Intra-abdominal abscess (HCC) 09/26/2022   Mitral valve insufficiency    Pyelonephritis affecting pregnancy in second trimester 04/22/2022   Admitted 11/5   Systolic heart failure [facility])     Past Surgical History:  Procedure Laterality Date   CESAREAN SECTION     CESAREAN SECTION N/A 02/02/2015   Procedure: CESAREAN SECTION;  Surgeon: Geryl Rankins, MD;  Location: [facility] ORS;  Service: Obstetrics;  Laterality: N/A;   CESAREAN SECTION N/A 09/11/2022   Procedure: CESAREAN SECTION;  Surgeon: Lazaro Arms, MD;  Location: [facility] ORS;  Service: Obstetrics;  Laterality: N/A;   DILATION AND CURETTAGE OF UTERUS     WISDOM TOOTH EXTRACTION      The following portions of the patient's history were reviewed and updated as appropriate: allergies, current medications, past family history, past medical history, past social history, past surgical history and problem list.   Health Maintenance:  Normal pap and negative HRHPV on 11/19/2022.  Review of Systems:  Pertinent items noted in HPI and remainder of comprehensive ROS otherwise negative.  Physical Exam:  BP 109/75   Pulse 83   Wt 122 lb (55.3 kg)   BMI 20.94 kg/m  CONSTITUTIONAL:  Well-developed, well-nourished female in no acute distress.  HEENT:  Normocephalic, atraumatic. External right and left ear normal. No scleral icterus.  NECK: Normal range of motion, supple, no masses noted on observation SKIN: No rash noted. Not diaphoretic. No erythema. No pallor. MUSCULOSKELETAL: Normal range of motion. No edema noted. NEUROLOGIC: Alert and oriented to person, place, and time. Normal muscle tone coordination. No cranial nerve deficit noted. PSYCHIATRIC: Normal mood and affect. Normal behavior. Normal judgment and thought content. CARDIOVASCULAR: Normal heart rate noted RESPIRATORY: Effort and breath sounds normal, no problems with respiration noted ABDOMEN: Mild-moderate tenderness to palpation diffusely in all quadrants. No rebound or guarding.  No masses noted. No other overt distention noted.   PELVIC: Deferred     Assessment and Plan:     1. Generalized abdominal pain Pain could be musculoskeletal or nerve-related, also worried about adhesive disease.  Patient was told that these etiologies may not be able to be visualized on imaging.  CT scan ordered, will follow up results and manage accordingly.  Continue pain medications as needed.  - CT ABDOMEN PELVIS W CONTRAST; Future  Routine preventative health maintenance measures emphasized. Please refer to After Visit Summary for other counseling recommendations.   Return for any gynecologic concerns.    I spent 25 minutes dedicated to the care of this patient including pre-visit review of records, face to face time with the patient discussing her conditions and treatments and post visit orders.    Jaynie Collins, MD,  FACOG Obstetrician & Gynecologist, Biochemist, clinical for [employer], [facility]

## 2023-04-11 ENCOUNTER — Other Ambulatory Visit: Payer: Self-pay

## 2023-04-11 ENCOUNTER — Emergency Department (HOSPITAL_BASED_OUTPATIENT_CLINIC_OR_DEPARTMENT_OTHER)
Admission: EM | Admit: 2023-04-11 | Discharge: 2023-04-11 | Disposition: A | Discharge status: Home / Self Care | Payer: Medicaid Other | Attending: Emergency Medicine | Admitting: Emergency Medicine

## 2023-04-11 ENCOUNTER — Emergency Department (HOSPITAL_BASED_OUTPATIENT_CLINIC_OR_DEPARTMENT_OTHER): Payer: Medicaid Other

## 2023-04-11 ENCOUNTER — Encounter (HOSPITAL_BASED_OUTPATIENT_CLINIC_OR_DEPARTMENT_OTHER): Payer: Self-pay | Admitting: Emergency Medicine

## 2023-04-11 ENCOUNTER — Encounter (HOSPITAL_COMMUNITY): Payer: Self-pay

## 2023-04-11 DIAGNOSIS — M545 Low back pain, unspecified: Secondary | ICD-10-CM | POA: Diagnosis present

## 2023-04-11 DIAGNOSIS — D72829 Elevated white blood cell count, unspecified: Secondary | ICD-10-CM | POA: Insufficient documentation

## 2023-04-11 LAB — COMPREHENSIVE METABOLIC PANEL
ALT: 39 U/L (ref 0–44)
AST: 27 U/L (ref 15–41)
Albumin: 4.5 g/dL (ref 3.5–5.0)
Alkaline Phosphatase: 41 U/L (ref 38–126)
Anion gap: 9 (ref 5–15)
BUN: 13 mg/dL (ref 6–20)
CO2: 24 mmol/L (ref 22–32)
Calcium: 8.9 mg/dL (ref 8.9–10.3)
Chloride: 102 mmol/L (ref 98–111)
Creatinine, Ser: 0.6 mg/dL (ref 0.44–1.00)
GFR, Estimated: >60 mL/min (ref >60)
Glucose, Bld: 136 mg/dL — ABNORMAL HIGH (ref 70–99)
Potassium: 4.1 mmol/L (ref 3.5–5.1)
Sodium: 135 mmol/L (ref 135–145)
Total Bilirubin: 1.3 mg/dL — ABNORMAL HIGH (ref 0.3–1.2)
Total Protein: 8.2 g/dL — ABNORMAL HIGH (ref 6.5–8.1)

## 2023-04-11 LAB — URINALYSIS, MICROSCOPIC (REFLEX): RBC / HPF: NONE SEEN RBC/hpf (ref 0–5)

## 2023-04-11 LAB — CBC
HCT: 38.3 % (ref 36.0–46.0)
Hemoglobin: 12.8 g/dL (ref 12.0–15.0)
MCH: 26.1 pg (ref 26.0–34.0)
MCHC: 33.4 g/dL (ref 30.0–36.0)
MCV: 78.2 fL — ABNORMAL LOW (ref 80.0–100.0)
Platelets: 458 10*3/uL — ABNORMAL HIGH (ref 150–400)
RBC: 4.9 MIL/uL (ref 3.87–5.11)
RDW: 14.9 % (ref 11.5–15.5)
WBC: 11.4 10*3/uL — ABNORMAL HIGH (ref 4.0–10.5)
nRBC: 0 % (ref 0.0–0.2)

## 2023-04-11 LAB — URINALYSIS, ROUTINE W REFLEX MICROSCOPIC
Bilirubin Urine: NEGATIVE
Glucose, UA: NEGATIVE mg/dL
Hgb urine dipstick: NEGATIVE
Ketones, ur: NEGATIVE mg/dL
Nitrite: NEGATIVE
Protein, ur: NEGATIVE mg/dL
Specific Gravity, Urine: 1.015 (ref 1.005–1.030)
pH: 5.5 (ref 5.0–8.0)

## 2023-04-11 LAB — PREGNANCY, URINE: Preg Test, Ur: NEGATIVE

## 2023-04-11 MED ORDER — NAPROXEN 500 MG PO TABS: 500.0000 mg | ORAL_TABLET | Freq: Two times a day (BID) | ORAL | 0 refills | Status: DC

## 2023-04-11 MED ORDER — IOHEXOL 300 MG/ML  SOLN
100.0000 mL | Freq: Once | INTRAMUSCULAR | Status: AC | PRN
Start: 2023-04-11 — End: 2023-04-11
  Administered 2023-04-11: 100 mL via INTRAVENOUS

## 2023-04-11 MED ORDER — KETOROLAC TROMETHAMINE 15 MG/ML IJ SOLN
15.0000 mg | Freq: Once | INTRAMUSCULAR | Status: AC
  Administered 2023-04-11: 15 mg via INTRAVENOUS
  Filled 2023-04-11: qty 1

## 2023-04-11 MED ORDER — OXYCODONE-ACETAMINOPHEN 5-325 MG PO TABS: 1.0000 | ORAL_TABLET | Freq: Four times a day (QID) | ORAL | 0 refills | Status: DC | PRN

## 2023-04-11 NOTE — Discharge Instructions (Addendum)
Please take tylenol/ibuprofen/naproxen or Percocet as needed for pain. I recommend close follow-up with PCP and OBGYN for reevaluation.  Please do not hesitate to return to emergency department if worrisome signs symptoms we discussed become apparent.

## 2023-04-11 NOTE — ED Triage Notes (Signed)
Lower back pain radiates down left leg.  No known injury.

## 2023-04-11 NOTE — ED Provider Notes (Signed)
Patient care taken over at shift handoff from outgoing provider.  For full HPI, please refer to previous providers note.  Plan is to reassess after CT abdomen pelvis.  Physical Exam  BP 121/82   Pulse 68   Temp 98.7 F (37.1 C) (Oral)   Resp 19   Ht 5\' 4"  (1.626 m)   Wt 56.7 kg   LMP 04/03/2023   SpO2 100%   Breastfeeding No   BMI 21.46 kg/m   Physical Exam Vitals and nursing note reviewed.  Constitutional:      Appearance: Normal appearance.  HENT:     Head: Normocephalic and atraumatic.     Mouth/Throat:     Mouth: Mucous membranes are moist.  Eyes:     General: No scleral icterus. Cardiovascular:     Rate and Rhythm: Normal rate and regular rhythm.     Pulses: Normal pulses.     Heart sounds: Normal heart sounds.  Pulmonary:     Effort: Pulmonary effort is normal.     Breath sounds: Normal breath sounds.  Abdominal:     General: Abdomen is flat.     Palpations: Abdomen is soft.     Tenderness: There is no abdominal tenderness.  Musculoskeletal:        General: No deformity.     Comments: Tenderness to palpation to paraspinal muscles of the lumbar spine on the left side.  No midline tenderness.  Skin:    General: Skin is warm.     Findings: No rash.  Neurological:     General: No focal deficit present.     Mental Status: She is alert.  Psychiatric:        Mood and Affect: Mood normal.     Procedures  Procedures  ED Course / MDM    Medical Decision Making Amount and/or Complexity of Data Reviewed Labs: ordered. Radiology: ordered.  Risk Prescription drug management.   37 year old female presents with a chief complaint of left lower back pain.  On physical examination, patient is afebrile and appeared in no acute distress.  There was tenderness to palpation to paraspinal muscles of the lumbar spine on the left side.  No midline tenderness.  CBC with leukocytosis of 11.4, no anemia.  CMP with no acute electrolyte abnormalities or AKI.  CT abdomen pelvis  showed prominent vascularity around the uterus.  Otherwise unremarkable.  Given Toradol for pain.  Patient reports improvement after this medication.  Patient is awake, alert, is able to tolerate p.o. here.  Advised patient to follow-up with OB/GYN for further evaluation and management.  Strict ED return precaution discussed.  Patient is agreeable to the plan.  I sent a short course of oxycodone for pain control at home.  Disposition Continued outpatient therapy. Follow-up with PCP recommended for reevaluation of symptoms. Treatment plan discussed with patient.  Pt acknowledged understanding was agreeable to the plan. Worrisome signs and symptoms were discussed with patient, and patient acknowledged understanding to return to the ED if they noticed these signs and symptoms. Patient was stable upon discharge.   This chart was dictated using voice recognition software.  Despite best efforts to proofread,  errors can occur which can change the documentation meaning.         Jeanelle Malling, PA 04/11/23 2051    Rexford Maus, DO 04/11/23 2319

## 2023-04-11 NOTE — Progress Notes (Incomplete)
***In Progress***    Advanced Heart Failure Clinic Note  Referring Physician: No ref. provider found  Primary Care: Patient, No Pcp Per Primary Cardiologist: Laurance Flatten, MD HF Cardiologist: Dorthula Nettles, DO  HPI:  [name] is a 37 y.o. female with heart failure with reduced ejection fraction. Her cardiac history dates back to November 2023 when she was admitted with pyelonephritis. At that time, she was noted to be tachycardic with inferolateral T wave inversions on ECG. She had a follow-up echocardiogram demonstrating EF of 40%, apical lateral hypokinesis and moderate to severe mitral regurgitation. She had a repeat echocardiogram in 07/2022 with a EF of 45 to 50% and moderate MR. In March 2024, she was admitted with severe preeclampsia and active labor. She underwent C-section at that time with echocardiogram demonstrating EF of 30 to 35%. Unfortunately, she was once more readmitted in April 2024 for sepsis secondary to intra-abdominal abscess requiring IV antibiotics and IR drainage.  Most recently underwent cardiac MRI in July 2024 with a EF of 31%, no LGE and moderate mitral regurgitation.   At last visit 03/25/23 - Today, she returns to HF clinic for pharmacist medication titration. At last visit with MD, losartan was increased to 25 mg daily and Farxiga 10 mg daily was reinitiated. Overall feeling okay with ups and downs. Some mornings she feels sluggish and lightheaded and believes it's related to how well she sleeps. Since restarting Farxiga, she has not been sleeping well, leading to more sluggishness and lightheadedness, and complains of urinary urgency and burning sensation when she urinates. She decided to discontinue [place] three days ago and feels a bit better now. Has less lightheadedness and urinary symptoms have resolved. She reports a decrease in appetite that has worsened since last visit. Has occasional episodes of SOB where she will have difficulty catching her  breath. This occurs randomly (even sometimes with sitting) and only lasts a few seconds.  Often feels fatigue due to busy schedule with kids. She does not weigh herself daily at home. She reports feeling bloated after drinking water and takes Lasix 20 mg PRN at night when she feels abdominal bloating, usually ~1-2 times per week. She took a dose last night. No LEE, PND or orthopnea.  She has been trying to follow a low salt diet. She mainly takes BP at home if she does not feel well. Her SBP during episodes of lightheadedness is ~112. Her SBP is 118-120 most days.    Today she returns to HF clinic for pharmacist medication titration. At last visit, spironolactone was increased to 25 mg daily.   Overall feeling ***. Dizziness, lightheadedness, fatigue:  Chest pain or palpitations:  How is your breathing?: *** SOB: Able to complete all ADLs. Activity level ***  Weight at home pounds. Takes furosemide/torsemide/bumex *** mg *** daily.  LEE PND/Orthopnea  Appetite *** Low-salt diet:   Physical Exam Cost/affordability of meds   HF Medications: Metoprolol succinate 12.5 mg daily Losartan 25 mg daily Spironolactone 25 mg daily Farxiga 10 mg daily Digoxin 0.125 mg daily Lasix 20 mg PRN  Has the patient been experiencing any side effects to the medications prescribed?  {YES NO:22349}  Does the patient have any problems obtaining medications due to transportation or finances? No - [place] Medicaid   Understanding of regimen: {excellent/good/fair/poor:19665} Understanding of indications: {excellent/good/fair/poor:19665} Potential of compliance: {excellent/good/fair/poor:19665} Patient understands to avoid NSAIDs. Patient understands to avoid decongestants.    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  NYHA class / AHA Stage:III Volume status & Diuretics: Volume status stable, continue Lasix 20 mg as needed. Encouraged her to take in the morning to avoid waking up at night to urinate.  Beta-Blocker: continue metoprolol succinate 12.5 mg daily Vasodilators: continue losartan 25 mg daily.  MRA: increase spironolactone to 25 mg daily Cardiometabolic: Did not tolerate Farxiga due to feelings of sluggishness, lightheadedness and UTI symptoms.  Digitalis glycosides: continue digoxin 0.125 mg daily  Advanced therapies: CPX with moderate to severe HF limitation with VE/VCO2 slope of 31, Peak VO2 of 17.6 (54% of predicted).    2.  Moderate mitral regurgitation -Previously severe, improving with GDMT. Cardiac MRI in July 2024 with moderate MR (regurgitant fraction 24%)   3. Palpitations - Improving; HR 73   4. Peripartum cardiomyopathy  - Prescribed SLYND but has not started yet; followed by Dr. Berton Lan   Follow up with pharmacist at HF clinic in 3 weeks.    Follow up ***   Karle Plumber, PharmD, BCPS, BCCP, CPP Heart Failure Clinic Pharmacist [phone]

## 2023-04-11 NOTE — ED Provider Notes (Signed)
[facility] Provider Note   CSN: [id number] Arrival date & time: 04/11/23  1507     History  Chief Complaint  Patient presents with   Back Pain    [name] is a 37 y.o. female.   Back Pain   37 year old female presents emergency department with complaints of left low back pain.  Patient states that back pain began this morning after awakening.  Denies any excessive lifting/straining.  Reports left low back pain without radiation.  Reports nausea with no emesis.  Denies any dysuria, hematuria, urinary frequency/urgency.  Denies any saddle anesthesia, bowel/bladder dysfunction, weakness/sensory deficits in lower extremities, prolonged corticosteroid use, no malignancy.  Denies any fever, chills, chest pain, shortness of breath, vaginal symptoms, change in bowel habits.  Patient also states that she has been having intermittent stabbing abdominal pain ever since she had cesarean section in March.  States that she developed "infection" after initial C-section and had to be on prolonged antibiotics.  States that she has upcoming CT scan by her OB/GYN for assessment of potential etiologies.  Past medical history significant for intra-abdominal abscess, pyelonephritis, systolic heart failure, mitral valve insufficiency  Home Medications Prior to Admission medications   Medication Sig Start Date End Date Taking? Authorizing Provider  digoxin (LANOXIN) 0.125 MG tablet Take 1 tablet (0.125 mg total) by mouth daily. 01/02/23   Sabharwal, Aditya, DO  Drospirenone (SLYND) 4 MG TABS Take 1 tablet (4 mg total) by mouth daily. Patient not taking: Reported on 03/04/2023 12/27/22   Lennart Pall, MD  furosemide (LASIX) 20 MG tablet Take 1 tablet (20 mg total) by mouth daily as needed for fluid or edema. 01/24/23   Sabharwal, Aditya, DO  losartan (COZAAR) 25 MG tablet Take 1 tablet (25 mg total) by mouth daily. 03/04/23   Sabharwal, Aditya, DO   metoprolol succinate (TOPROL XL) 25 MG 24 hr tablet Take 1/2 tablet (12.5 mg total) by mouth daily. 03/20/23   Bensimhon, Bevelyn Buckles, MD  spironolactone (ALDACTONE) 25 MG tablet Take 1 tablet (25 mg total) by mouth daily. 03/25/23   Sabharwal, Eliezer Lofts, DO      Allergies    Penicillins    Review of Systems   Review of Systems  Musculoskeletal:  Positive for back pain.  All other systems reviewed and are negative.   Physical Exam Updated Vital Signs BP 111/80 (BP Location: Left Arm)   Pulse 72   Temp 98.5 F (36.9 C) (Oral)   Resp 20   Ht 5\' 4"  (1.626 m)   Wt 56.7 kg   LMP 04/03/2023   SpO2 100%   Breastfeeding No   BMI 21.46 kg/m  Physical Exam Vitals and nursing note reviewed.  Constitutional:      General: She is not in acute distress.    Appearance: She is well-developed.  HENT:     Head: Normocephalic and atraumatic.  Eyes:     Conjunctiva/sclera: Conjunctivae normal.  Cardiovascular:     Rate and Rhythm: Normal rate and regular rhythm.     Heart sounds: Murmur heard.  Pulmonary:     Effort: Pulmonary effort is normal. No respiratory distress.     Breath sounds: Normal breath sounds. No wheezing, rhonchi or rales.  Abdominal:     Palpations: Abdomen is soft.     Tenderness: There is no abdominal tenderness. There is left CVA tenderness. There is no guarding.     Comments: Left-sided CVA versus paraspinal tenderness in lumbar region.  Musculoskeletal:        General: No swelling.     Cervical back: Neck supple.     Comments: No midline tenderness of lumbar spine without step-off or deformity.  Muscular strength 5 out of 5 for bilateral lower extremities at hip flexion/extension, knee flexion/extension, ankle dorsi/plantarflexion.  No sensory deficits along major nerve distributions of lower extremities.  DTR symmetric at patella.  Pedal pulses 2+ bilaterally.  Straight leg raise negative bilaterally.  Skin:    General: Skin is warm and dry.     Capillary Refill:  Capillary refill takes less than 2 seconds.  Neurological:     Mental Status: She is alert.  Psychiatric:        Mood and Affect: Mood normal.     ED Results / Procedures / Treatments   Labs (all labs ordered are listed, but only abnormal results are displayed) Labs Reviewed  URINALYSIS, ROUTINE W REFLEX MICROSCOPIC - Abnormal; Notable for the following components:      Result Value   Leukocytes,Ua SMALL (*)    All other components within normal limits  URINALYSIS, MICROSCOPIC (REFLEX) - Abnormal; Notable for the following components:   Bacteria, UA RARE (*)    All other components within normal limits  COMPREHENSIVE METABOLIC PANEL - Abnormal; Notable for the following components:   Glucose, Bld 136 (*)    Total Protein 8.2 (*)    Total Bilirubin 1.3 (*)    All other components within normal limits  CBC - Abnormal; Notable for the following components:   WBC 11.4 (*)    MCV 78.2 (*)    Platelets 458 (*)    All other components within normal limits  PREGNANCY, URINE    EKG None  Radiology No results found.  Procedures Procedures    Medications Ordered in ED Medications  ketorolac (TORADOL) 15 MG/ML injection 15 mg (15 mg Intravenous Given 04/11/23 1810)  iohexol (OMNIPAQUE) 300 MG/ML solution 100 mL (100 mLs Intravenous Contrast Given 04/11/23 1904)    ED Course/ Medical Decision Making/ A&P                                 Medical Decision Making Amount and/or Complexity of Data Reviewed Labs: ordered. Radiology: ordered.  Risk Prescription drug management.   This patient presents to the ED for concern of abdominal pain, this involves an extensive number of treatment options, and is a complaint that carries with it a high risk of complications and morbidity.  The differential diagnosis includes gastritis, PUD, cholecystitis, CBD pathology, SBO/LBO, volvulus,    Co morbidities that complicate the patient evaluation  See HPI   Additional history  obtained:  Additional history obtained from EMR External records from outside source obtained and reviewed including hospital records   Lab Tests:  I Ordered, and personally interpreted labs.  The pertinent results include: Leukocytosis of 11.4.  No evidence of anemia.  Thrombocytosis of 458.  No electrolyte abnormalities.  No transaminitis.  Nonspecific elevation of total bilirubin of 1.3.  No renal dysfunction.  UA significant for contaminated sample with 3 bacteria, 6-10 WBCs with small leukocytes.   Imaging Studies ordered:  I ordered imaging studies including CT abdomen pelvis which is pending   Cardiac Monitoring: / EKG:  The patient was maintained on a cardiac monitor.  I personally viewed and interpreted the cardiac monitored which showed an underlying rhythm of: Sinus rhythm   Consultations Obtained:  N/a   Problem List / ED Course / Critical interventions / Medication management  Flank pain I ordered medication including Toradol   Reevaluation of the patient after these medicines showed that the patient improved I have reviewed the patients home medicines and have made adjustments as needed   Social Determinants of Health:  Denies tobacco, licit drug use   Test / Admission - Considered:  Flank pain Vitals signs within normal range and stable throughout visit. Laboratory/imaging studies significant for: See above 37 year old female presents emergency department with complaints of left-sided low back pain.  On exam, patient with left-sided CVA tenderness.  Labs significant for mild leukocytosis 11.4.  Awaiting CT imaging of patient's abdomen/pelvis for assessment of left-sided flank pain.  At time of shift change, patient care have to PA-C Le.  Patient stable upon shift change.        Final Clinical Impression(s) / ED Diagnoses Final diagnoses:  None    Rx / DC Orders ED Discharge Orders     None         Peter Garter, [place] 04/11/23  1917    Rexford Maus, DO 04/11/23 1936

## 2023-04-15 ENCOUNTER — Ambulatory Visit (HOSPITAL_COMMUNITY)
Admission: RE | Admit: 2023-04-15 | Discharge: 2023-04-15 | Disposition: A | Discharge status: Home / Self Care | Payer: Medicaid Other | Source: Ambulatory Visit | Attending: Cardiology

## 2023-04-15 VITALS — BP 112/78 | HR 78 | Wt 125.8 lb

## 2023-04-15 DIAGNOSIS — M549 Dorsalgia, unspecified: Secondary | ICD-10-CM | POA: Diagnosis not present

## 2023-04-15 DIAGNOSIS — I429 Cardiomyopathy, unspecified: Secondary | ICD-10-CM | POA: Diagnosis not present

## 2023-04-15 DIAGNOSIS — I110 Hypertensive heart disease with heart failure: Secondary | ICD-10-CM | POA: Diagnosis not present

## 2023-04-15 DIAGNOSIS — I5020 Unspecified systolic (congestive) heart failure: Secondary | ICD-10-CM | POA: Diagnosis present

## 2023-04-15 DIAGNOSIS — O9943 Diseases of the circulatory system complicating the puerperium: Secondary | ICD-10-CM | POA: Diagnosis not present

## 2023-04-15 DIAGNOSIS — R002 Palpitations: Secondary | ICD-10-CM | POA: Insufficient documentation

## 2023-04-15 DIAGNOSIS — I340 Nonrheumatic mitral (valve) insufficiency: Secondary | ICD-10-CM | POA: Insufficient documentation

## 2023-04-15 DIAGNOSIS — I5022 Chronic systolic (congestive) heart failure: Secondary | ICD-10-CM

## 2023-04-15 LAB — URINALYSIS, ROUTINE W REFLEX MICROSCOPIC
Bilirubin Urine: NEGATIVE
Glucose, UA: NEGATIVE mg/dL
Hgb urine dipstick: NEGATIVE
Ketones, ur: NEGATIVE mg/dL
Nitrite: NEGATIVE
Protein, ur: NEGATIVE mg/dL
Specific Gravity, Urine: 1.018 (ref 1.005–1.030)
WBC, UA: >50 WBC/hpf (ref 0–5)
pH: 6 (ref 5.0–8.0)

## 2023-04-15 LAB — COMPREHENSIVE METABOLIC PANEL
ALT: 36 U/L (ref 0–44)
AST: 29 U/L (ref 15–41)
Albumin: 3.6 g/dL (ref 3.5–5.0)
Alkaline Phosphatase: 43 U/L (ref 38–126)
Anion gap: 4 — ABNORMAL LOW (ref 5–15)
BUN: 10 mg/dL (ref 6–20)
CO2: 26 mmol/L (ref 22–32)
Calcium: 8.5 mg/dL — ABNORMAL LOW (ref 8.9–10.3)
Chloride: 108 mmol/L (ref 98–111)
Creatinine, Ser: 0.64 mg/dL (ref 0.44–1.00)
GFR, Estimated: >60 mL/min (ref >60)
Glucose, Bld: 76 mg/dL (ref 70–99)
Potassium: 4 mmol/L (ref 3.5–5.1)
Sodium: 138 mmol/L (ref 135–145)
Total Bilirubin: 1 mg/dL (ref 0.3–1.2)
Total Protein: 7 g/dL (ref 6.5–8.1)

## 2023-04-15 LAB — CBC
HCT: 35.8 % — ABNORMAL LOW (ref 36.0–46.0)
Hemoglobin: 11.6 g/dL — ABNORMAL LOW (ref 12.0–15.0)
MCH: 25.3 pg — ABNORMAL LOW (ref 26.0–34.0)
MCHC: 32.4 g/dL (ref 30.0–36.0)
MCV: 78.2 fL — ABNORMAL LOW (ref 80.0–100.0)
Platelets: 444 10*3/uL — ABNORMAL HIGH (ref 150–400)
RBC: 4.58 MIL/uL (ref 3.87–5.11)
RDW: 14.7 % (ref 11.5–15.5)
WBC: 6.6 10*3/uL (ref 4.0–10.5)
nRBC: 0 % (ref 0.0–0.2)

## 2023-04-15 LAB — BRAIN NATRIURETIC PEPTIDE: B Natriuretic Peptide: 80.1 pg/mL (ref 0.0–100.0)

## 2023-04-15 NOTE — Patient Instructions (Signed)
It was a pleasure seeing you today!  MEDICATIONS: -No medication changes today -Call if you have questions about your medications.  LABS: -We will call you if your labs need attention.  NEXT APPOINTMENT: Return to clinic in 3 weeks with [facility].  In general, to take care of your heart failure: -Limit your fluid intake to 2 Liters (half-gallon) per day.   -Limit your salt intake to ideally 2-3 grams (2000-3000 mg) per day. -Weigh yourself daily and record, and bring that "weight diary" to your next appointment.  (Weight gain of 2-3 pounds in 1 day typically means fluid weight.) -The medications for your heart are to help your heart and help you live longer.   -Please contact us before stopping any of your heart medications.  Call the clinic at [phone] with questions or to reschedule future appointments.

## 2023-04-15 NOTE — Progress Notes (Signed)
Advanced Heart Failure Clinic Note   Primary Care: Patient, No Pcp Per Primary Cardiologist: Laurance Flatten, MD HF Cardiologist: Dorthula Nettles, DO  HPI:  [name] is a 37 y.o. female with heart failure with reduced ejection fraction. Her cardiac history dates back to November 2023 when she was admitted with pyelonephritis. At that time, she was noted to be tachycardic with inferolateral T wave inversions on ECG. She had a follow-up echocardiogram demonstrating EF of 40%, apical lateral hypokinesis and moderate to severe mitral regurgitation. She had a repeat echocardiogram in 07/2022 with a EF of 45 to 50% and moderate MR. In March 2024, she was admitted with severe preeclampsia and active labor. She underwent C-section at that time with echocardiogram demonstrating EF of 30 to 35%. Unfortunately, she was once more readmitted in April 2024 for sepsis secondary to intra-abdominal abscess requiring IV antibiotics and IR drainage. Most recently underwent cardiac MRI in July 2024 with a EF of 31%, no LGE and moderate mitral regurgitation.  At last visit on 03/25/23, she reported feeling sluggish and lightheaded. She discontinued [place] after experiencing UTI symptoms. Since stopping the medication, her lightheadedness had decreased, and her urinary symptoms had resolved. She occasionally experienced SOB, finding it difficult to catch her breath. These episodes happened randomly (even while sitting) and lasted only a few seconds.  Today she returns to HF clinic for pharmacist medication titration. At last visit, spironolactone was increased to 25 mg daily. Overall feeling better and has seen improvements in her sleep since the last visit. However, her main concern is left lower back pain, for which she went to the ED last week. UA negative for UTI at that time.  CT abdomen pelvis showed prominent vascularity around the uterus.  Otherwise unremarkable. It was recommended that she follow up  with her gynecologist and she was given medication for pain relief.   She experiences occasional dizziness that occurs randomly while walking, which worsens when transitioning from sitting to standing. This dizziness appears to have worsened slightly since the increase in losartan on 03/04/23. She experienced a one-time episode of sharp chest pain while bathing her child, which lasted about two minutes before subsiding and has not recurred. Notes she has palpitations that occur at least once daily, each lasting around two minutes, even when she is sitting and relaxed. Still experiences SOB with mild/moderate activity, notes she gets SOB walking across the [employer] parking lot. SOB did get a little worse recently with back pain, which she attributes to breathing differently to try and manage the pain. She does not weigh herself at home. In clinic, weight stable around 125 lbs. On Lasix 20 mg PRN but has not needed any doses. No LEE, PND or orthopnea. SBP at home between 107-110 mmHg. In clinic, BP 112/78 mmHg. She reports experiencing symptoms of a UTI, including burning and urgency, last night. She took a leftover dose of nitrofurantoin from a previous UTI, which provided some relief.    HF Medications: Metoprolol succinate 12.5 mg daily Losartan 25 mg daily Spironolactone 25 mg daily Digoxin 0.125 mg daily Lasix 20 mg PRN   Does the patient have any problems obtaining medications due to transportation or finances? No - [place] Medicaid   Understanding of regimen: fair Understanding of indications: fair Potential of compliance: fair Patient understands to avoid NSAIDs. Patient understands to avoid decongestants.    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  NYHA class / AHA Stage:III Volume status & Diuretics: Volume status stable, continue Lasix 20 mg as needed. Beta-Blocker: continue metoprolol succinate 12.5 mg daily Vasodilators: continue losartan 25 mg daily.  MRA: continue spironolactone 25 mg daily Cardiometabolic: Did not tolerate Farxiga due to feelings of sluggishness, lightheadedness and UTI symptoms.  Digitalis glycosides: continue digoxin 0.125 mg daily  Advanced therapies: CPX with moderate to severe HF limitation with VE/VCO2 slope of 31, Peak VO2 of 17.6 (54% of predicted).    2.  Moderate mitral regurgitation -Previously severe, improving with GDMT. Cardiac MRI in July 2024 with moderate MR (regurgitant fraction 24%)   3. Palpitations - Improving; HR 78   4. Peripartum cardiomyopathy  - Prescribed SLYND but has not started yet; followed by Dr. Berton Lan  5. Back pain -Discussed patient with Dr. Gasper Lloyd. She has left lower back pain that feels similar to previous UTIs, but ED visit 04/11/23 negative for UTI and all other workup largely unremarkable. He recommended obtaining an UA, CMET, BNP, and CBC today. Also recommended she reach out to her gynecologist for further workup as current symptoms are not likely to be cardiac in nature.     Follow up in 3 weeks with APP clinic. Hopefully can resume HF GDMT titration if she is feeling better.    Karle Plumber, PharmD, BCPS, BCCP, CPP Heart Failure Clinic Pharmacist [phone]

## 2023-04-16 NOTE — Telephone Encounter (Signed)
Forms printed

## 2023-05-10 ENCOUNTER — Encounter (HOSPITAL_COMMUNITY): Payer: Medicaid Other

## 2023-05-10 NOTE — Progress Notes (Signed)
ADVANCED HEART FAILURE CLINIC NOTE  Referring Physician: No ref. provider found  Primary Care: Patient, No Pcp Per Primary Cardiologist: Laurance Flatten, MD HF: Dorthula Nettles, DO  HPI: [name] is a 37 y.o. female with heart failure with reduced ejection fraction presenting today to establish care.  Her cardiac history dates back to November 2023 when she was admitted with pyelonephritis.  At that time she was noted to be tachycardic with inferolateral T wave inversions on ECG.  She had a follow-up echocardiogram demonstrating EF of 40%, apical lateral hypokinesis and moderate to severe mitral regurgitation.  She was started on Toprol 12.5 mg daily and since that time has been followed by Laurance Flatten.  She had a repeat echocardiogram in 2/24 with a EF of 45 to 50% and moderate MR.  In March 2024 she was admitted with severe preeclampsia and active labor.  She underwent C-section at that time with echocardiogram demonstrating EF of 30 to 35%.  Unfortunately once more readmitted in April 2024 for sepsis secondary to intra-abdominal abscess requiring IV antibiotics and IR drainage.  Most recently underwent cardiac MRI in July 24 with a EF of 31%, no LGE and moderate mitral regurgitation.  Interval hx:  - Since our last appointment, she has had some improvement in functional class. She is able to walk slightly further and has noticed improvement in her sleep.   Activity level/exercise tolerance:  NYHA III Orthopnea:  Sleeps on 2 pillows Paroxysmal noctural dyspnea:  No Chest pain/pressure:  No Orthostatic lightheadedness:  Yes, infrequent Palpitations:  No Lower extremity edema:  No Presyncope/syncope:  No Cough:  yes  Past Medical History:  Diagnosis Date   Bacterial infection    History of chicken pox    Intra-abdominal abscess (HCC) 09/26/2022   Mitral valve insufficiency    Pyelonephritis affecting pregnancy in second trimester 04/22/2022   Admitted 11/5    Systolic heart failure (HCC)     Current Outpatient Medications  Medication Sig Dispense Refill   digoxin (LANOXIN) 0.125 MG tablet Take 1 tablet (0.125 mg total) by mouth daily. 90 tablet 3   Drospirenone (SLYND) 4 MG TABS Take 1 tablet (4 mg total) by mouth daily. (Patient not taking: Reported on 03/04/2023) 90 tablet 3   furosemide (LASIX) 20 MG tablet Take 1 tablet (20 mg total) by mouth daily as needed for fluid or edema.     losartan (COZAAR) 25 MG tablet Take 1 tablet (25 mg total) by mouth daily. 90 tablet 3   metoprolol succinate (TOPROL XL) 25 MG 24 hr tablet Take 1/2 tablet (12.5 mg total) by mouth daily. 15 tablet 6   naproxen (NAPROSYN) 500 MG tablet Take 1 tablet (500 mg total) by mouth 2 (two) times daily. (Patient not taking: Reported on 04/15/2023) 30 tablet 0   oxyCODONE-acetaminophen (PERCOCET/ROXICET) 5-325 MG tablet Take 1 tablet by mouth every 6 (six) hours as needed. 8 tablet 0   spironolactone (ALDACTONE) 25 MG tablet Take 1 tablet (25 mg total) by mouth daily. 90 tablet 3   No current facility-administered medications for this visit.    Allergies  Allergen Reactions   Penicillins Rash      Social History   Socioeconomic History   Marital status: Single    Spouse name: Not on file   Number of children: Not on file   Years of education: Not on file   Highest education level: Not on file  Occupational History   Not on file  Tobacco Use  Smoking status: Never   Smokeless tobacco: Never  Vaping Use   Vaping status: Never Used  Substance and Sexual Activity   Alcohol use: No   Drug use: No   Sexual activity: Not Currently    Birth control/protection: None  Other Topics Concern   Not on file  Social History Narrative   Not on file   Social Determinants of Health   Financial Resource Strain: Not on file  Food Insecurity: No Food Insecurity (09/26/2022)   Hunger Vital Sign    Worried About Running Out of Food in the Last Year: Never true    Ran Out  of Food in the Last Year: Never true  Transportation Needs: No Transportation Needs (09/26/2022)   PRAPARE - Administrator, Civil Service (Medical): No    Lack of Transportation (Non-Medical): No  Physical Activity: Not on file  Stress: Not on file  Social Connections: Not on file  Intimate Partner Violence: Not At Risk (09/26/2022)   Humiliation, Afraid, Rape, and Kick questionnaire    Fear of Current or Ex-Partner: No    Emotionally Abused: No    Physically Abused: No    Sexually Abused: No      Family History  Problem Relation Age of Onset   Hypertension Mother    Hypertension Father    Diabetes Father    Hypertension Paternal Aunt    Asthma Neg Hx    Cancer Neg Hx    Heart disease Neg Hx    Stroke Neg Hx     PHYSICAL EXAM: There were no vitals filed for this visit. GENERAL: Well nourished, well developed, and in no apparent distress at rest.  HEENT: Negative for arcus senilis or xanthelasma. There is no scleral icterus.  The mucous membranes are pink and moist.   NECK: Supple, No masses. Normal carotid upstrokes without bruits. No masses or thyromegaly.    CHEST: There are no chest wall deformities. There is no chest wall tenderness. Respirations are unlabored.  Lungs- CTA B/L CARDIAC:  JVP: 5 cm          Normal rate with regular rhythm. No murmurs, rubs or gallops.  Pulses are 2+ and symmetrical in upper and lower extremities. No edema.  ABDOMEN: Soft, non-tender, non-distended. There are no masses or hepatomegaly. There are normal bowel sounds.  EXTREMITIES: Warm and well perfused with no cyanosis, clubbing.  LYMPHATIC: No axillary or supraclavicular lymphadenopathy.  NEUROLOGIC: Patient is oriented x3 with no focal or lateralizing neurologic deficits.  PSYCH: Patients affect is appropriate, there is no evidence of anxiety or depression.  SKIN: Warm and dry; no lesions or wounds.      DATA REVIEW  ECG: 09/12/22: sinus tachycardia  as per my  interpretation  ECHO: 09/12/22: LVEF 30-35%, normal RV function as per my  interpretation 09/07/22: LVEF 35%, severe MR.  07/20/22: LVEF 45-50%, moderate MR.   CMR 12/19/22:  .  Severe LV dilatation with moderate systolic dysfunction (EF 31%)  2.  Normal RV size and systolic function (EF 54%)  3.  No late gadolinium enhancement to suggest myocardial scar  4.  Moderate mitral regurgitation (regurgitant fraction 24%)  CPX 02/20/23:  Exercise testing with gas exchange demonstrates a severely reduced peak VO2 of 17.6 ml/kg/min (54% of the age/gender/weight matched sedentary norms). The RER of 1.18 indicates a maximal effort. The VE/VCO2 slope is mildly elevated and indicates increased dead space ventilation. The oxygen uptake efficiency slope (OUES) is normal. The VO2 at the  ventilatory threshold was normal at 44% of the predicted peak VO2. At peak exercise, the ventilation reached 51% of the measured MVV indicating ventilatory reserve remained. The O2pulse (a surrogate for stroke volume) increased with initial exercise, reaching early flattened and blunted peak at 7 ml/beat (70% predicted).   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  NYHA class / AHA Stage:III Volume status & Diuretics: lasix 20mg  as needed. Vasodilators: increase losartan to 25mg  daily.  Beta-Blocker: continue metoprolol 12.5mg  daily & digoxin ; repeat levels today.  NGE:[id number] 12.5mg  daily Cardiometabolic: she has recurrent UTIs; however, we will try [place] once more.  Devices therapies & Valvulopathies:not currently indicated Advanced therapies: CPX with moderate to severe HF limitation with VE/VCO2 slope of 31, Peak VO2 of 17.6 (54% of predicted). Will uptitrate GDMT with low threshold for CPX. She has had mild improvement in functional status and sleep over the past 2  weeks.   2.  Moderate mitral regurgitation -Previously severe, improving with GDMT.  Cardiac MRI in July 2024 with moderate MR (regurgitant fraction 24%)  3. Palpitations - Improving;   4. Peripartum cardiomyopathy  - Now on SLYND; followed by Dr. Vito Berger Advanced Heart Failure Mechanical Circulatory Support

## 2023-05-13 ENCOUNTER — Telehealth (HOSPITAL_COMMUNITY): Payer: Self-pay

## 2023-05-13 NOTE — Telephone Encounter (Signed)
Called and left patient a voice message to confirm/remind patient of their appointment at the [facility] Failure Clinic on 05/14/23.     And to bring in all medications and/or complete list.

## 2023-05-14 ENCOUNTER — Ambulatory Visit (HOSPITAL_COMMUNITY)
Admission: RE | Admit: 2023-05-14 | Discharge: 2023-05-14 | Disposition: A | Discharge status: Home / Self Care | Payer: Medicaid Other | Source: Ambulatory Visit | Attending: Family Medicine

## 2023-05-14 ENCOUNTER — Encounter (HOSPITAL_COMMUNITY): Payer: Self-pay

## 2023-05-14 VITALS — BP 108/66 | HR 82 | Wt 123.4 lb

## 2023-05-14 DIAGNOSIS — R42 Dizziness and giddiness: Secondary | ICD-10-CM | POA: Diagnosis not present

## 2023-05-14 DIAGNOSIS — M7989 Other specified soft tissue disorders: Secondary | ICD-10-CM | POA: Diagnosis not present

## 2023-05-14 DIAGNOSIS — I5022 Chronic systolic (congestive) heart failure: Secondary | ICD-10-CM | POA: Diagnosis not present

## 2023-05-14 DIAGNOSIS — Z79899 Other long term (current) drug therapy: Secondary | ICD-10-CM | POA: Diagnosis not present

## 2023-05-14 DIAGNOSIS — R400 Somnolence: Secondary | ICD-10-CM | POA: Diagnosis not present

## 2023-05-14 DIAGNOSIS — R5383 Other fatigue: Secondary | ICD-10-CM | POA: Insufficient documentation

## 2023-05-14 DIAGNOSIS — R0683 Snoring: Secondary | ICD-10-CM | POA: Diagnosis not present

## 2023-05-14 DIAGNOSIS — R002 Palpitations: Secondary | ICD-10-CM | POA: Diagnosis not present

## 2023-05-14 DIAGNOSIS — I340 Nonrheumatic mitral (valve) insufficiency: Secondary | ICD-10-CM | POA: Insufficient documentation

## 2023-05-14 DIAGNOSIS — Z87440 Personal history of urinary (tract) infections: Secondary | ICD-10-CM | POA: Diagnosis not present

## 2023-05-14 DIAGNOSIS — R0602 Shortness of breath: Secondary | ICD-10-CM | POA: Insufficient documentation

## 2023-05-14 DIAGNOSIS — O903 Peripartum cardiomyopathy: Secondary | ICD-10-CM | POA: Insufficient documentation

## 2023-05-14 LAB — TSH: TSH: 0.71 u[IU]/mL (ref 0.350–4.500)

## 2023-05-14 LAB — CBC
HCT: 38.7 % (ref 36.0–46.0)
Hemoglobin: 12.8 g/dL (ref 12.0–15.0)
MCH: 25.8 pg — ABNORMAL LOW (ref 26.0–34.0)
MCHC: 33.1 g/dL (ref 30.0–36.0)
MCV: 78 fL — ABNORMAL LOW (ref 80.0–100.0)
Platelets: 453 10*3/uL — ABNORMAL HIGH (ref 150–400)
RBC: 4.96 MIL/uL (ref 3.87–5.11)
RDW: 14.6 % (ref 11.5–15.5)
WBC: 7.2 10*3/uL (ref 4.0–10.5)
nRBC: 0 % (ref 0.0–0.2)

## 2023-05-14 LAB — IRON AND TIBC
Iron: 157 ug/dL (ref 28–170)
Saturation Ratios: 55 % — ABNORMAL HIGH (ref 10.4–31.8)
TIBC: 284 ug/dL (ref 250–450)
UIBC: 127 ug/dL

## 2023-05-14 LAB — DIGOXIN LEVEL: Digoxin Level: 0.4 ng/mL — ABNORMAL LOW (ref 0.8–2.0)

## 2023-05-14 LAB — BASIC METABOLIC PANEL
Anion gap: 6 (ref 5–15)
BUN: 14 mg/dL (ref 6–20)
CO2: 26 mmol/L (ref 22–32)
Calcium: 9.2 mg/dL (ref 8.9–10.3)
Chloride: 103 mmol/L (ref 98–111)
Creatinine, Ser: 0.7 mg/dL (ref 0.44–1.00)
GFR, Estimated: >60 mL/min (ref >60)
Glucose, Bld: 86 mg/dL (ref 70–99)
Potassium: 4.3 mmol/L (ref 3.5–5.1)
Sodium: 135 mmol/L (ref 135–145)

## 2023-05-14 LAB — VITAMIN B12: Vitamin B-12: 421 pg/mL (ref 180–914)

## 2023-05-14 LAB — VITAMIN D 25 HYDROXY (VIT D DEFICIENCY, FRACTURES): Vit D, 25-Hydroxy: 30.62 ng/mL (ref 30–100)

## 2023-05-14 LAB — FERRITIN: Ferritin: 125 ng/mL (ref 11–307)

## 2023-05-14 LAB — BRAIN NATRIURETIC PEPTIDE: B Natriuretic Peptide: 28.6 pg/mL (ref 0.0–100.0)

## 2023-05-14 MED ORDER — METOPROLOL SUCCINATE ER 25 MG PO TB24: 12.5000 mg | ORAL_TABLET | Freq: Every evening | ORAL | 6 refills | Status: DC

## 2023-05-14 MED ORDER — LOSARTAN POTASSIUM 25 MG PO TABS: 12.5000 mg | ORAL_TABLET | Freq: Every day | ORAL | 3 refills | Status: DC

## 2023-05-14 MED ORDER — MECLIZINE HCL 25 MG PO TABS: 25.0000 mg | ORAL_TABLET | Freq: Three times a day (TID) | ORAL | 0 refills | Status: DC | PRN

## 2023-05-14 NOTE — Patient Instructions (Addendum)
Thank you for coming in today  If you had labs drawn today, any labs that are abnormal the clinic will call you No news is good news  You have been order for sleep study [facility] will call you to make appointment   You were provided a primary care list   Medications: Change Torpol Xl to nightly  Decrease Losartan to 12.5 mg 1/2 tablet nightly Meclizine 25 mg take 1/2 tablet to 1 tablet every 6-12 hours as needed  Follow up appointments:  Your physician recommends that you schedule a follow-up appointment in:  3 months With Dr. Gasper Lloyd with echocardiogram You will receive a reminder letter in the mail a few months in advance. If you don't receive a letter, please call our office to schedule the follow-up appointment.  Your physician has requested that you have an echocardiogram. Echocardiography is a painless test that uses sound waves to create images of your heart. It provides your doctor with information about the size and shape of your heart and how well your heart's chambers and valves are working. This procedure takes approximately one hour. There are no restrictions for this procedure.      Do the following things EVERYDAY: Weigh yourself in the morning before breakfast. Write it down and keep it in a log. Take your medicines as prescribed Eat low salt foods--Limit salt (sodium) to 2000 mg per day.  Stay as active as you can everyday Limit all fluids for the day to less than 2 liters   At the [facility], you and your health needs are our priority. As part of our continuing mission to provide you with exceptional heart care, we have created designated Provider Care Teams. These Care Teams include your primary Cardiologist (physician) and Advanced Practice Providers (APPs- Physician Assistants and Nurse Practitioners) who all work together to provide you with the care you need, when you need it.   You may see any of the following providers on  your designated Care Team at your next follow up: Dr Arvilla Meres Dr Marca Ancona Dr. Marcos Eke, NP Robbie Lis, [place] [facility], [place] Brynda Peon, NP Karle Plumber, PharmD   Please be sure to bring in all your medications bottles to every appointment.    Thank you for choosing [facility]  If you have any questions or concerns before your next appointment please send [place] a message through [place] or call our office at [phone].    TO LEAVE A MESSAGE FOR THE NURSE SELECT OPTION 2, PLEASE LEAVE A MESSAGE INCLUDING: YOUR NAME DATE OF BIRTH CALL BACK NUMBER REASON FOR CALL**this is important as we prioritize the call backs  YOU WILL RECEIVE A CALL BACK THE SAME DAY AS LONG AS YOU CALL BEFORE 4:00 PM

## 2023-05-15 ENCOUNTER — Telehealth (HOSPITAL_COMMUNITY): Payer: Self-pay

## 2023-05-15 NOTE — Addendum Note (Signed)
Addended by: Jaynie Collins A on: 05/15/2023 09:36 AM   Modules accepted: Orders

## 2023-05-15 NOTE — Telephone Encounter (Signed)
error 

## 2023-06-10 ENCOUNTER — Other Ambulatory Visit (HOSPITAL_COMMUNITY): Payer: Self-pay

## 2023-06-10 DIAGNOSIS — I5022 Chronic systolic (congestive) heart failure: Secondary | ICD-10-CM

## 2023-06-10 MED ORDER — MECLIZINE HCL 25 MG PO TABS: ORAL_TABLET | ORAL | 0 refills | Status: DC

## 2023-06-17 ENCOUNTER — Telehealth (HOSPITAL_COMMUNITY): Payer: Self-pay | Admitting: Vascular Surgery

## 2023-06-17 NOTE — Telephone Encounter (Signed)
Mailbox full, ill try back to give sleep study

## 2023-07-02 ENCOUNTER — Telehealth (HOSPITAL_COMMUNITY): Payer: Self-pay | Admitting: Cardiology

## 2023-07-02 NOTE — Telephone Encounter (Signed)
Pt aware.

## 2023-07-02 NOTE — Telephone Encounter (Signed)
 Patient called to report she has not felt well over the past few days  -fatigue -r arm numbness -edema (hands)  -no weights/vitals -reports mild SOB (normal),dizziness  -denies CP -reports compliance with medications, no new medications -reports compliance with fluid restrictions\   Please advise

## 2023-07-29 ENCOUNTER — Encounter: Payer: Self-pay | Admitting: Family Medicine

## 2023-07-29 ENCOUNTER — Ambulatory Visit: Payer: Medicaid Other | Admitting: Family Medicine

## 2023-07-29 VITALS — BP 121/82 | HR 82 | Temp 97.2°F | Resp 18 | Ht 64.0 in | Wt 124.0 lb

## 2023-07-29 DIAGNOSIS — R0683 Snoring: Secondary | ICD-10-CM

## 2023-07-29 DIAGNOSIS — I5022 Chronic systolic (congestive) heart failure: Secondary | ICD-10-CM | POA: Diagnosis not present

## 2023-07-29 DIAGNOSIS — R5383 Other fatigue: Secondary | ICD-10-CM

## 2023-07-29 DIAGNOSIS — R42 Dizziness and giddiness: Secondary | ICD-10-CM

## 2023-07-29 DIAGNOSIS — J984 Other disorders of lung: Secondary | ICD-10-CM | POA: Insufficient documentation

## 2023-07-29 NOTE — Progress Notes (Signed)
 Assessment/Plan:      Fatigue Chronic fatigue with potential contributing factors including cardiac condition, sleep quality, and medication side effects. Hematologic, endocrine, and organic causes ruled out by recent labs. Negative depression screening. Sleep apnea, if present, could significantly impact fatigue levels. If sleep study and cardiac evaluation are negative, referral to CBT for chronic fatigue management may be considered. 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 Close follow-up with cardiology is essential to monitor and manage symptoms. - Follow up with cardiology - Complete scheduled echocardiogram  Numbness and Tingling in Left Arm Intermittent numbness and tingling in the left arm for two months, improving with movement. Possible causes include cervical or disc disease, or rotator cuff issues. If symptoms progress, imaging and physical therapy may be necessary. - Recommend anti-inflammatories (ibuprofen  or acetaminophen ) - Advise stretches and ice application - Consider imaging if symptoms progress - Consider physical therapy if needed  Vertigo Intermittent dizziness and lightheadedness, sometimes triggered by movement or standing up abruptly, with a sensation of rocking and occasional nausea. Meclizine  has not been tried due to concerns about drowsiness. Vestibular rehabilitation exercises and the Epley maneuver could help alleviate symptoms. Orthostatic in office negative. Further evaluation may be needed if symptoms persist. - Recommend vestibular rehabilitation exercises - Consider Epley maneuver with a partner - Monitor  response to meclizine  if tried - Consider neuroimaging and/or referral to ENT or neurology if persist  General Health Maintenance Discussed the importance of sleep quality and potential impact of medications on fatigue and dizziness. Emphasized regular follow-ups and symptom monitoring. - Complete scheduled sleep study - Monitor and adjust medications as needed based on follow-up results  Follow-up - Follow up with cardiology - Complete scheduled sleep study - Follow up with primary care for further evaluation and management.        There are no discontinued medications.  Return in about 1 month (around 08/26/2023).    Subjective:   Encounter date: 07/29/2023  [name] is a 38 y.o. female who has Abnormal Pap smear of cervix; Systolic heart failure (HCC); Mitral valve regurgitation; Snoring; Restrictive lung disease; Other fatigue; and Vertigo on their problem list..   She  has a past medical history of Bacterial infection, History of chicken pox, Intra-abdominal abscess (HCC) (09/26/2022), Mitral valve insufficiency, Pyelonephritis affecting pregnancy in second trimester (04/22/2022), and Systolic heart failure (HCC)..   She presents with chief complaint of Establish Care (PT C/O of fatigue for 1 month and numbness that comes and go. ) .  Discussed the use of AI scribe software for clinical note transcription with the patient, who gave verbal consent to proceed.  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 She is accompanied by her son. She was referred by her cardiologist for evaluation of fatigue and numbness.  She experiences ongoing fatigue described as an 'everyday thing' with varying intensity. She feels she never has any energy and does not get restful sleep. The fatigue has been present for a while and is exacerbated by physical activity, leading to increased  exhaustion. She does not engage in regular physical activity. No wheezing or cough. She experiences episodes of dizziness,  described as feeling lightheaded or as if she is 'on a boat,' which can occur when standing up quickly or even when lying down. This dizziness sometimes makes her nauseous. She has not tried meclizine , which was prescribed for vertigo, due to concerns about drowsiness. No hearing changes or headaches.  She reports numbness and tingling in her left arm, which comes and goes, often occurring when she is still. Moving the arm alleviates the sensation, which she describes as more of an 'achy feeling' rather than pain. This has been ongoing for about two months, and she has not noticed any worsening. She does not take any medication for this issue.  She has a history of peripartum cardiomyopathy, diagnosed about a year ago, and is under the care of a cardiologist. She experiences occasional palpitations and shortness of breath, particularly with exertion. No wheezing. She is currently on a low dose of metoprolol  (12.5 mg daily) and other medications including losartan , furosemide , and spironolactone . Her menstrual cycles are becoming regular and are not heavy. No waking up gasping for air and no history of pulmonary function testing.         07/29/2023    2:42 PM 07/27/2022    8:40 AM  Depression screen PHQ 2/9  Decreased Interest 0 0  Down, Depressed, Hopeless 0 0  PHQ - 2 Score 0 0  Altered sleeping 2 1  Tired, decreased energy 3 1  Change in appetite 0 0  Feeling bad or failure about yourself  0 0  Trouble concentrating 0 0  Moving slowly or fidgety/restless 0 0  Suicidal thoughts 0 0  PHQ-9 Score 5 2  Difficult doing work/chores Somewhat difficult       07/29/2023    2:42 PM 07/27/2022    8:40 AM  GAD 7 : Generalized Anxiety Score  Nervous, Anxious, on Edge 0 1  Control/stop worrying 1 1  Worry too much - different things 1 0  Trouble relaxing 1 0  Restless 0 0   Easily annoyed or irritable 1 1  Afraid - awful might happen 0 0  Total GAD 7 Score 4 3  Anxiety Difficulty Somewhat difficult       HPI:   ROS  Past Surgical History:  Procedure Laterality Date   CESAREAN SECTION     CESAREAN SECTION N/A 02/02/2015   Procedure: CESAREAN SECTION;  Surgeon: Johnn Najjar, MD;  Location: [facility];  Service: Obstetrics;  Laterality: N/A;   CESAREAN SECTION N/A 09/11/2022   Procedure: CESAREAN SECTION;  Surgeon: Wendelyn Halter, MD;  Location: [facility];  Service: Obstetrics;  Laterality: N/A;   DILATION AND CURETTAGE OF UTERUS     WISDOM TOOTH EXTRACTION      Outpatient Medications Prior to Visit  Medication Sig Dispense Refill   digoxin  (LANOXIN ) 0.125 MG tablet Take 1 tablet (0.125 mg total) by mouth daily. 90 tablet 3   Drospirenone  (SLYND ) 4 MG TABS Take 1 tablet (4 mg total) by mouth daily. 90 tablet 3   furosemide  (LASIX ) 20 MG tablet Take 1 tablet (20 mg total) by mouth daily as needed for fluid or edema.     losartan  (COZAAR ) 25 MG tablet Take 0.5 tablets (12.5 mg total) by mouth daily. 45 tablet 3   meclizine  (MEDI-MECLIZINE ) 25 MG tablet Take 1/2 tablet to 1 tablet by mouth every 6 to 12 hours as needed 30 tablet 0   metoprolol  succinate (TOPROL  XL) 25 MG 24 hr tablet Take 0.5 tablets (12.5 mg total)  by mouth at bedtime. 15 tablet 6   spironolactone  (ALDACTONE ) 25 MG tablet Take 1 tablet (25 mg total) by mouth daily. 90 tablet 3   No facility-administered medications prior to visit.    Family History  Problem Relation Age of Onset   Hypertension Mother    Hypertension Father    Diabetes Father    Hypertension Paternal Aunt    Asthma Neg Hx    Cancer Neg Hx    Heart disease Neg Hx    Stroke Neg Hx     Social History   Socioeconomic History   Marital status: Single    Spouse name: Not on file   Number of children: Not on file   Years of education: Not on file   Highest education level: Not on file  Occupational History    Not on file  Tobacco Use   Smoking status: Never   Smokeless tobacco: Never  Vaping Use   Vaping status: Never Used  Substance and Sexual Activity   Alcohol use: No   Drug use: No   Sexual activity: Not Currently    Birth control/protection: None  Other Topics Concern   Not on file  Social History Narrative   Not on file   Social Drivers of Health   Financial Resource Strain: Not on file  Food Insecurity: No Food Insecurity (09/26/2022)   Hunger Vital Sign    Worried About Running Out of Food in the Last Year: Never true    Ran Out of Food in the Last Year: Never true  Transportation Needs: No Transportation Needs (09/26/2022)   PRAPARE - Administrator, Civil Service (Medical): No    Lack of Transportation (Non-Medical): No  Physical Activity: Not on file  Stress: Not on file  Social Connections: Not on file  Intimate Partner Violence: Not At Risk (09/26/2022)   Humiliation, Afraid, Rape, and Kick questionnaire    Fear of Current or Ex-Partner: No    Emotionally Abused: No    Physically Abused: No    Sexually Abused: No                                                                                                  Objective:  Physical Exam: BP 121/82 (BP Location: Left Arm, Patient Position: Standing, Cuff Size: Normal)   Pulse 82   Temp (!) 97.2 F (36.2 C) (Temporal)   Resp 18   Ht 5\' 4"  (1.626 m)   Wt 124 lb (56.2 kg)   LMP 06/28/2023 (Exact Date)   SpO2 100%   Breastfeeding No   BMI 21.28 kg/m    Wt Readings from Last 3 Encounters:  07/29/23 124 lb (56.2 kg)  05/14/23 123 lb 6.4 oz (56 kg)  04/15/23 125 lb 12.8 oz (57.1 kg)   Results   DIAGNOSTIC Pulmonary Function Tests: FVC 2.64 L, FEV1 2.36 L, FEV1/FVC ratio 0.89, FEF 25-75% 3.13 L/s, PEF 4.19 L/s, FET 2.38 s (07/29/2023)      Physical Exam Constitutional:      General: She is not in acute distress.  Appearance: Normal appearance. She is not ill-appearing or toxic-appearing.   HENT:     Head: Normocephalic and atraumatic.     Right Ear: Hearing, tympanic membrane, ear canal and external ear normal.     Left Ear: Hearing, tympanic membrane, ear canal and external ear normal.     Nose: Nose normal. No congestion.  Eyes:     General: Vision grossly intact. Gaze aligned appropriately. No scleral icterus.    Extraocular Movements: Extraocular movements intact.     Conjunctiva/sclera: Conjunctivae normal.     Pupils: Pupils are equal, round, and reactive to light.  Cardiovascular:     Rate and Rhythm: Normal rate and regular rhythm.     Pulses: Normal pulses.     Heart sounds: Normal heart sounds.  Pulmonary:     Effort: Pulmonary effort is normal. No respiratory distress.     Breath sounds: Normal breath sounds.  Abdominal:     General: Abdomen is flat. Bowel sounds are normal.     Palpations: Abdomen is soft.  Musculoskeletal:        General: Normal range of motion.     Left shoulder: Tenderness present. Normal range of motion.     Cervical back: No tenderness. Normal range of motion.     Comments: 5/5 strength throughout all extremities  Lymphadenopathy:     Cervical: No cervical adenopathy.  Skin:    General: Skin is warm and dry.     Findings: No rash.  Neurological:     General: No focal deficit present.     Mental Status: She is alert and oriented to person, place, and time. Mental status is at baseline.     Cranial Nerves: Cranial nerves 2-12 are intact.     Sensory: Sensation is intact.     Motor: Motor function is intact.     Coordination: Coordination is intact.     Gait: Gait is intact.  Psychiatric:        Mood and Affect: Mood normal.        Behavior: Behavior normal.        Thought Content: Thought content normal.        Judgment: Judgment normal.    Orthostatic VS for the past 72 hrs (Last 3 readings):  Patient Position BP Location Cuff Size  07/29/23 1511 Standing Left Arm Normal  07/29/23 1510 Supine Left Arm Normal  07/29/23  1422 Sitting Left Arm Normal    No results found.  Recent Results (from the past 2160 hours)  Basic Metabolic Panel (BMET)     Status: None   Collection Time: 05/14/23 11:19 AM  Result Value Ref Range   Sodium 135 135 - 145 mmol/L   Potassium 4.3 3.5 - 5.1 mmol/L   Chloride 103 98 - 111 mmol/L   CO2 26 22 - 32 mmol/L   Glucose, Bld 86 70 - 99 mg/dL    Comment: Glucose reference range applies only to samples taken after fasting for at least 8 hours.   BUN 14 6 - 20 mg/dL   Creatinine, Ser 6.57 0.44 - 1.00 mg/dL   Calcium  9.2 8.9 - 10.3 mg/dL   GFR, Estimated >84 >69 mL/min    Comment: (NOTE) Calculated using the CKD-EPI Creatinine Equation (2021)    Anion gap 6 5 - 15    Comment: Performed at [facility], [address], [place], [place] [address]  B Nat Peptide     Status: None   Collection Time: 05/14/23  11:19 AM  Result Value Ref Range   B Natriuretic Peptide 28.6 0.0 - 100.0 pg/mL    Comment: Performed at [facility], [address], [place], [place] [address]  Digoxin  level     Status: Abnormal   Collection Time: 05/14/23 11:19 AM  Result Value Ref Range   Digoxin  Level 0.4 (L) 0.8 - 2.0 ng/mL    Comment: Performed at [facility], [address] [place], [place] [address]  CBC     Status: Abnormal   Collection Time: 05/14/23 11:19 AM  Result Value Ref Range   WBC 7.2 4.0 - 10.5 K/uL   RBC 4.96 3.87 - 5.11 MIL/uL   Hemoglobin 12.8 12.0 - 15.0 g/dL   HCT 82.9 56.2 - 13.0 %   MCV 78.0 (L) 80.0 - 100.0 fL   MCH 25.8 (L) 26.0 - 34.0 pg   MCHC 33.1 30.0 - 36.0 g/dL   RDW 86.5 78.4 - 69.6 %   Platelets 453 (H) 150 - 400 K/uL   nRBC 0.0 0.0 - 0.2 %    Comment: Performed at [facility], [address], [place], [place] [address]  TSH     Status: None   Collection Time: 05/14/23 11:19 AM  Result Value Ref Range   TSH 0.710 0.350 - 4.500 uIU/mL    Comment: Performed by a 3rd Generation assay with a functional sensitivity of <=0.01  uIU/mL. Performed at [facility], [address], [place], [place] [address]   Iron  and TIBC     Status: Abnormal   Collection Time: 05/14/23 11:19 AM  Result Value Ref Range   Iron  157 28 - 170 ug/dL   TIBC 401 027 - 253 ug/dL   Saturation Ratios 55 (H) 10.4 - 31.8 %   UIBC 127 ug/dL    Comment: Performed at [facility], [address], [place], [place] [address]  Ferritin     Status: None   Collection Time: 05/14/23 11:19 AM  Result Value Ref Range   Ferritin 125 11 - 307 ng/mL    Comment: Performed at [facility], [address], [place], [place] [address]  Vitamin D  (25 hydroxy)     Status: None   Collection Time: 05/14/23 11:19 AM  Result Value Ref Range   Vit D, 25-Hydroxy 30.62 30 - 100 ng/mL    Comment: (NOTE) Vitamin D  deficiency has been defined by the Institute of Medicine  and an Endocrine Society practice guideline as a level of serum 25-OH  vitamin D  less than 20 ng/mL (1,2). The Endocrine Society went on to  further define vitamin D  insufficiency as a level between 21 and 29  ng/mL (2).  1. IOM (Institute of Medicine). 2010. Dietary reference intakes for  calcium  and D. [place]: [employer]. 2. Holick MF, Binkley [place], Bischoff-Ferrari HA, et al. Evaluation,  treatment, and prevention of vitamin D  deficiency: an Endocrine  Society clinical practice guideline, JCEM. 2011 Jul; 96(7): 1911-30.  Performed at [facility], [address] [place], [place] [address]   B12     Status: None   Collection Time: 05/14/23 11:19 AM  Result Value Ref Range   Vitamin B-12 421 180 - 914 pg/mL    Comment: (NOTE) This assay is not validated for testing neonatal or myeloproliferative syndrome specimens for Vitamin B12 levels. Performed at [facility], [address], [place], [place] [address]  Carnell Christian, MD, MS

## 2023-07-29 NOTE — Patient Instructions (Addendum)
 VISIT SUMMARY:  Today, we discussed your ongoing fatigue, numbness in your left arm, and episodes of dizziness. 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 We also talked about the importance of sleep quality and the potential impact of your medications on your symptoms.  YOUR PLAN:  -FATIGUE: Your chronic fatigue may be related to your heart condition, sleep quality, or medication side effects. We will follow up with your cardiologist, complete a scheduled sleep study, and evaluate your medications.  Breathing test did show a possible restrictive element of lung disease.  Recommend referral to pulmonology to further evaluate this further If these evaluations do not identify a cause, we may refer you to therapy for chronic fatigue management.  -HEART FAILURE (PERIPARTUM CARDIOMYOPATHY): Peripartum cardiomyopathy is a type of heart failure that occurs during or after pregnancy. We will continue to monitor your condition closely with your cardiologist and complete a scheduled echocardiogram to assess your heart function.  -NUMBNESS AND TINGLING IN LEFT ARM: The numbness and tingling in your left arm may be due to issues with your neck or shoulder. We recommend using anti-inflammatory medications like ibuprofen  or acetaminophen , doing stretches, and applying ice. If your symptoms get worse, we may need to do imaging tests or consider physical therapy.  -VERTIGO: Vertigo is a sensation of dizziness that can feel like you are spinning or rocking. We recommend trying vestibular rehabilitation exercises and the Epley maneuver to help with your symptoms. If you decide to try meclizine , monitor how it affects you.  -GENERAL HEALTH MAINTENANCE: We discussed the importance of good sleep quality and how your medications might affect your fatigue and dizziness. Please complete your scheduled sleep study and keep monitoring your symptoms. We will adjust your  medications as needed based on follow-up results.  INSTRUCTIONS:  Please follow up with your cardiologist and complete the scheduled sleep study. Establish care with pulmonology to reassess lungs.   Vestibular (Balance) Exercises Introduction  You have a problem with your balance or equilibrium. Do not be afraid of your dizziness. Only you can build up the tolerance in your brain to overcome your dizziness. It is like an exercise for muscle building. It requires regular, capacity-extending work to build up strength or tolerance. Keep provoking your dizziness many times each day, realizing that each purposeful, controlled episode of dizziness brings you closer to your last one. Once you have gained a measure of improvement and control in your practice sessions, seek out sports or other movement activities that have been difficult and spend increasing lengths of time on them until they no longer produce any symptoms.  How do vestibular exercises work?  The purpose of these exercises is to improve one's central or brain's compensation for injuries or abnormalities within the vestibular or balance system. The brain interprets information gained from the vestibular or balance system. When there is an injury or abnormality in any portion of this system, the brain must be retrained or taught to interpret correctly the information it receives. Vestibular exercises merely stimulate the vestibular apparatus. This stimulation produces information to be processed by the brain. The goal in repeating these exercises is for the brain to learn to tolerate and accurately interpret this type of stimulation. By doing these exercises repetitively, one can even teach the brain to adapt to an abnormal stimulus. These exercises work in much the same way as the exercises skaters or dancers do to keep from becoming dizzy when they spin around rapidly.  Stated simply, one must seek out and overcome those positions or situations  which cause dizziness. Avoiding them will only prolong one's convalescence.  Aims of exercises:  To train movement of the eyes, independent of the head. To practice balancing in everyday situations with special attention to developing the use of the eyes and the muscle sense awareness. To practice head movements that cause dizziness. To become accustomed to moving about naturally in daylight and in the dark. Generally, to encourage the re-building of confidence in making easy, relaxed, spontaneous movements. When you begin:  During the first few times the exercise is performed, you should have another person present in case the dizziness becomes very severe.  When should you stop doing the exercise?  These exercises should be done at least three times a day for a minimum of 6 to 12 weeks or until the dizziness goes away altogether. Stopping before complete resolution of dizziness often results in a relapse in symptoms. The point at which one stops the exercises is when one has no dizziness for two consecutive weeks. The exercise may be stopped and restarted again at any time if dizziness returns.  The following exercises should be performed twice daily. The exercises are designed to challenge your balance system and often cause symptoms of dizziness. This is a normal response to these stimulating exercises. You should try to work through these symptoms if possible. If you feel you cannot, have your nurse contact an inpatient physical therapist for assistance.  Methods:  Work up to doing each movement 20 times. All exercises are started in exaggerated slow time and gradually increase speed to a more rapid rate. Be sure to continue exercises even though you become dizzy. Pause and rest only if you become nauseated or sick to your stomach. If ill, after resting, try a different exercise. If you still become sick to your stomach, postpone further work until your next session.  Head  Exercises  Bending: In a sitting position, bend your head down to look at the floor then up to look at the ceiling. Lead your head with your eyes focusing on the floor and the ceiling. Repeat this 10 times. Stop and wait for symptoms to resolve, about 30 seconds. Repeat entire process 2 more times. Turning (side to side): In a sitting position, turn your head to the right and then left, leading your head with your eyes as if you are watching a tennis match. Turn your head at a speed brisk enough to generate symptoms but not so fast that you strain your neck. (Slowly first, then quickly.) Go back and forth 10 times, and then wait for 30 seconds (or until symptoms resolve). Repeat entire process 2 more times. As the dizziness improves:  Perform head exercises with eyes closed. Progress to standing while performing head exercises. The closer together you put your feet, the more challenging it becomes.     Sitting  Shrug shoulders - 20 times. Turn shoulders to right and then to left - 20 times. Rotate head, shoulders and trunk - 20 times each: Rotate upper body right to left with eyes open, then repeat with eyes closed. Rotate upper body left to right with eyes open, then repeat with eyes closed. Bend forward and touch ground then sit up. Keep eyes focused on wall - 20 times. Bend forward and touch ground then sit up. Move eyes to floor and back - 20 times. Eye movements (head is still): Up and down (focusing on finger). Side  to side (focusing on finger). Finger to tip of nose and out (focusing on finger).  Standing Change from sitting to standing and back again 20 times with eyes open. Repeat with eyes closed. Standing with one foot in front of the other In a corner, practice standing "heel to toe" (one foot in front of the other with the heel of one foot touching the toe of the other foot) with eyes open for 30 seconds. The goal is to stand for the entire 30 seconds without  touching the wall. You may make this more challenging by crossing arms across chest. If this is too hard at first, try standing "almost heel to toe" (with feet touching at big toes and ankles). Once you have mastered these with eyes open, practice with eyes closed. Standing on a cushion In a corner, stand on a couch cushion or several pillows. Try to stand still without touching the wall for 30 seconds. Practice with eyes open. When this is easy, practice with eyes closed. You may make this more challenging by placing feet closer together. Crossing arm across your chest also makes this more challenging. You should progress by performing this in the most challenging position possible. Standing and throwing Throw a small rubber ball from hand to hand above eye level. Throw ball from hand to hand under one knee. Stand with heels together Look straight ahead and hold balance (attempt only with assistance). Stand on one foot Perform first with eyes open then with eyes closed (attempt only with assistance). Miscellaneous Do activities involving stooping, stretching, bending, going up and down stairs (attempt only with assistance).  Walking Walking in a straight line In a hallway or next to a wall, practice walking in a straight line for 5 minutes with one foot in front of the other or "heel to toe" (with the heel of one foot touching the toe of the other foot). If this is too hard at first, practice walking "almost heel to toe" and gradually work to heel to toe touching. Walking combined with head turning In a hallway or open space, practice walking in a straight line while turning head and eyes left and right with every other step (i.e. when you step with your left you look left, when you step with your right you look right). Continue for the length of the hallway or about 20 feet. Repeat the process 3 times. Now repeat the entire process 3 more times but this time looking at the ceiling or floor. You  will need to rest between repetitions and let symptoms calm. Walk across the room Perform first with eyes open, then with eyes closed (attempt only with assistance). Lying Down Sitting on side of bed, quickly lie down to your left side swinging your feet onto the bed as you do. Lie there for 30 seconds or until symptoms resolve. Repeat 3 times. Now repeat 3 times to the right. Eye Exercises - Gaze Stabilization Tips  Target must remain in focus, not blurry and appear stationary while head is in motion. Perform exercise with small head movement (45 degrees on either side). Speed of head motion should be increased as long as target remains in focus. If you wear glasses, wear them while performing exercises. These exercises may provoke dizziness or nausea. Try to work through these symptoms. Rest between each exercise. Exercises demand full concentration. Avoid distractions. For safety, standing exercises should be performed next to a counter or next to someone. Gaze Stabilization Keep eyes fixed on  a single stationary target held in hand or placed on a wall 3-10 feet away. Now move head side to side for 30 seconds. Repeat 3 times. Now repeat 3 times while moving head up and down for 30 seconds. Do 3 sessions per day. You may progress this by beginning in a sitting position then move to standing with feet apart, standing with feet together, standing heel to toe, marching in place, or standing on form. This will also be more difficult if object you are focusing on is placed on a "busy wallpaper" or a checkerboard. Smooth pursuit Holding a single target, keep eyes fixed on the target. Slowly move it side to side for 30 seconds while head stays still. Perform in the sitting position. Progress to the standing position as tolerated. Now repeat moving head up and down. Repeat 20 times in each direction per session. Do 3 sessions per day. Head and eyes same direction Holding a single target (playing  card or pencil) keep eyes fixed on target. Slowly move target, head, and eyes in same direction (up and down, side to side) for 30 seconds. Perform in sitting position, you can progress this to standing as you improve. Repeat 3 times per session. This will be more difficult if you have a "background" of a busy wallpaper. Do 3 sessions per day. Head and eyes opposite direction Holding your target, keep your eyes focused on it and begin to slowly move target (up and down, side to side) while moving your head in the opposite direction of the target for 30 seconds. Repeat 3 times per session. You may progress from sitting to standing as in above exercise. Do 3 sessions per day. Continue to perform these exercises at least twice daily until your symptoms resolve. If these exercises do not produce symptoms, you do not need to continue them. Begin a walking program at home. You should walk 5 days a week. Start by walking for 5 minutes. Increase the length of walking time by 5 minutes each week until you can walk for 30 minutes continuously.  Good sleep habits (sometimes referred to as "sleep hygiene") can help you get a good night's sleep.  Some habits that can improve your sleep health:  Be consistent. Go to bed at the same time each night and get up at the same time each morning, including on the weekends Make sure your bedroom is quiet, dark, relaxing, and at a comfortable temperature Remove electronic devices, such as TVs, computers, and smart phones, from the bedroom Avoid large meals, caffeine, and alcohol before bedtime Get some exercise. Being physically active during the day can help you fall asleep more easily at night. For more  http://[email or web address].sleepeducation.org/essentials-in-sleep/healthy-sleep-habits

## 2023-08-02 ENCOUNTER — Telehealth (HOSPITAL_COMMUNITY): Payer: Self-pay

## 2023-08-02 NOTE — Telephone Encounter (Signed)
Received a fax requesting medical records from [facility]. Records were successfully faxed to: [phone] ,which was the number provided.. Medical request form will be scanned into patients chart.

## 2023-08-06 ENCOUNTER — Ambulatory Visit (HOSPITAL_BASED_OUTPATIENT_CLINIC_OR_DEPARTMENT_OTHER): Payer: Medicaid Other | Attending: Family Medicine | Admitting: Cardiology

## 2023-08-06 ENCOUNTER — Encounter (HOSPITAL_COMMUNITY): Payer: Self-pay | Admitting: Cardiology

## 2023-08-06 DIAGNOSIS — R0683 Snoring: Secondary | ICD-10-CM | POA: Diagnosis not present

## 2023-08-07 MED ORDER — LOSARTAN POTASSIUM 25 MG PO TABS: 12.5000 mg | ORAL_TABLET | Freq: Every day | ORAL | 3 refills | Status: DC

## 2023-08-07 MED ORDER — SPIRONOLACTONE 25 MG PO TABS: 25.0000 mg | ORAL_TABLET | Freq: Every day | ORAL | 3 refills | Status: AC

## 2023-08-07 MED ORDER — METOPROLOL SUCCINATE ER 25 MG PO TB24: 12.5000 mg | ORAL_TABLET | Freq: Every evening | ORAL | 6 refills | Status: DC

## 2023-08-07 MED ORDER — FUROSEMIDE 20 MG PO TABS: 20.0000 mg | ORAL_TABLET | Freq: Every day | ORAL | 11 refills | Status: AC | PRN

## 2023-08-07 MED ORDER — DIGOXIN 125 MCG PO TABS: 0.1250 mg | ORAL_TABLET | Freq: Every day | ORAL | 3 refills | Status: DC

## 2023-08-08 ENCOUNTER — Telehealth (HOSPITAL_COMMUNITY): Payer: Self-pay | Admitting: Cardiology

## 2023-08-08 NOTE — H&P (View-Only) (Signed)
 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 female with heart failure with reduced ejection fraction presenting today to establish care.  Her cardiac history dates back to November 2023 when she was admitted with pyelonephritis.  At that time she was noted to be tachycardic with inferolateral T wave inversions on ECG.  She had a follow-up echocardiogram demonstrating EF of 40%, apical lateral hypokinesis and moderate to severe mitral regurgitation.  She was started on Toprol 12.5 mg daily and since that time has been followed by Laurance Flatten.  She had a repeat echocardiogram in 2/24 with a EF of 45 to 50% and moderate MR.  In March 2024 she was admitted with severe preeclampsia and active labor.  She underwent C-section at that time with echocardiogram demonstrating EF of 30 to 35%.  Unfortunately once more readmitted in April 2024 for sepsis secondary to intra-abdominal abscess requiring IV antibiotics and IR drainage.  Most recently underwent cardiac MRI in July 24 with a EF of 31%, no LGE and moderate mitral regurgitation. 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 She has 3 children (66 yo daughter, 54 yo son, 92mo daughter). No contraception use.   Interval hx:  - She feels increasingly tired now; reports that she has been unable to return to work due to severe fatigue.  - Repeat TTE with severely reduced LVEF.  - NYHA III symptoms.   Activity level/exercise tolerance:  NYHA III Orthopnea:  Sleeps on 2 pillows Paroxysmal noctural dyspnea:  No Chest pain/pressure:  No Orthostatic lightheadedness:  Yes, infrequent Palpitations:  No Lower extremity edema:  No Presyncope/syncope:  No Cough:  yes  Current  Outpatient Medications  Medication Sig Dispense Refill   digoxin (LANOXIN) 0.125 MG tablet Take 1 tablet (0.125 mg total) by mouth daily. 90 tablet 3   furosemide (LASIX) 20 MG tablet Take 1 tablet (20 mg total) by mouth daily as needed for fluid or edema. 30 tablet 11   losartan (COZAAR) 25 MG tablet Take 0.5 tablets (12.5 mg total) by mouth daily. 45 tablet 3   meclizine (MEDI-MECLIZINE) 25 MG tablet Take 1/2 tablet to 1 tablet by mouth every 6 to 12 hours as needed 30 tablet 0   metoprolol succinate (TOPROL XL) 25 MG 24 hr tablet Take 0.5 tablets (12.5 mg total) by mouth at bedtime. 15 tablet 6   spironolactone (ALDACTONE) 25 MG tablet Take 1 tablet (25 mg total) by mouth daily. 90 tablet 3   Drospirenone (SLYND) 4 MG TABS Take 1 tablet (4 mg total) by mouth daily. (Patient not taking: Reported on 08/09/2023) 90 tablet 3   No current facility-administered medications for this encounter.    PHYSICAL EXAM: Vitals:   08/09/23 0938  BP: 108/66  Pulse: 67  SpO2: 97%   GENERAL: NAD Lungs- cta  CARDIAC:  JVP: 6 cm          Normal rate with regular rhythm. no murmur.  Pulses 2+. no edema.  ABDOMEN: Soft, non-tender, non-distended.  EXTREMITIES: Warm and well perfused.  NEUROLOGIC: No obvious FND    DATA REVIEW  ECG: 09/12/22: sinus tachycardia  as per my interpretation  ECHO: 09/12/22: LVEF 30-35%, normal RV function as per my  interpretation 09/07/22: LVEF 35%, severe MR.  07/20/22: LVEF 45-50%, moderate MR.   CMR 12/19/22:  .  Severe LV dilatation with moderate systolic dysfunction (EF 31%)  2.  Normal RV size and systolic function (EF 54%)  3.  No late gadolinium enhancement to suggest myocardial scar  4.  Moderate mitral regurgitation (regurgitant fraction 24%)  CPX 02/20/23:  Exercise testing with gas exchange demonstrates a severely reduced peak VO2 of 17.6 ml/kg/min (54% of the age/gender/weight matched sedentary norms). The RER of 1.18 indicates a maximal effort. The VE/VCO2  slope is mildly elevated and indicates increased dead space ventilation. The oxygen uptake efficiency slope (OUES) is normal. The VO2 at the ventilatory threshold was normal at 44% of the predicted peak VO2. At peak exercise, the ventilation reached 51% of the measured MVV indicating ventilatory reserve remained. The O2pulse (a surrogate for stroke volume) increased with initial exercise, reaching early flattened and blunted peak at 7 ml/beat (70% predicted).  Moderate to severe HF limitation with chronotropic incompetence.   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  NYHA class / AHA Stage:III Volume status & Diuretics: lasix 20mg  as needed. Vasodilators: increase losartan to 25mg  daily.  Beta-Blocker: continue metoprolol 12.5mg  daily & digoxin ; repeat levels today.  IHK:[id number] 12.5mg  daily Cardiometabolic: she has recurrent UTIs; however, we will try [place] once more.  Devices therapies & Valvulopathies: discussed risks v benefits of primary prevention ICD today; repeat TTE today with LVEF 30% Advanced therapies: CPX with moderate to severe HF limitation with VE/VCO2 slope of 31, Peak VO2 of 17.6 (54% of predicted). Will uptitrate GDMT with low threshold for CPX. She has had mild improvement in functional status and sleep over the past 2 weeks.  08/09/23: Due to worsening exercise capacity and persistently reduced LVEF by TTE will schedule [facility] for advanced therapies / transplant evaluation. We also   2.  Moderate mitral regurgitation -Previously severe, improving with GDMT.  Cardiac MRI in July 2024 with moderate MR (regurgitant fraction 24%)  3. Palpitations - resolved  4. Peripartum cardiomyopathy  - Now on SLYND; followed by Dr. Berton Lan - Lengthy discussion today regarding the importance of contraception.    Jamari Moten Advanced Heart Failure Mechanical Circulatory Support

## 2023-08-08 NOTE — Telephone Encounter (Signed)
Called patient at [phone] to remind her of her appointments on 08/09/23 at [facility].  9:00 AM echocardiogram  10:00 AM f/u appt to see Dr. Lisette Abu office left patient a voice message reminding patient of her appointments on Friday 08/09/23.

## 2023-08-08 NOTE — Progress Notes (Incomplete)
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 female with heart failure with reduced ejection fraction presenting today to establish care.  Her cardiac history dates back to November 2023 when she was admitted with pyelonephritis.  At that time she was noted to be tachycardic with inferolateral T wave inversions on ECG.  She had a follow-up echocardiogram demonstrating EF of 40%, apical lateral hypokinesis and moderate to severe mitral regurgitation.  She was started on Toprol 12.5 mg daily and since that time has been followed by Laurance Flatten.  She had a repeat echocardiogram in 2/24 with a EF of 45 to 50% and moderate MR.  In March 2024 she was admitted with severe preeclampsia and active labor.  She underwent C-section at that time with echocardiogram demonstrating EF of 30 to 35%.  Unfortunately once more readmitted in April 2024 for sepsis secondary to intra-abdominal abscess requiring IV antibiotics and IR drainage.  Most recently underwent cardiac MRI in July 24 with a EF of 31%, no LGE and moderate mitral regurgitation. 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 She has 3 children (66 yo daughter, 54 yo son, 92mo daughter). No contraception use.   Interval hx:  - She feels increasingly tired now; reports that she has been unable to return to work due to severe fatigue.  - Repeat TTE with severely reduced LVEF.  - NYHA III symptoms.   Activity level/exercise tolerance:  NYHA III Orthopnea:  Sleeps on 2 pillows Paroxysmal noctural dyspnea:  No Chest pain/pressure:  No Orthostatic lightheadedness:  Yes, infrequent Palpitations:  No Lower extremity edema:  No Presyncope/syncope:  No Cough:  yes  Current  Outpatient Medications  Medication Sig Dispense Refill   digoxin (LANOXIN) 0.125 MG tablet Take 1 tablet (0.125 mg total) by mouth daily. 90 tablet 3   furosemide (LASIX) 20 MG tablet Take 1 tablet (20 mg total) by mouth daily as needed for fluid or edema. 30 tablet 11   losartan (COZAAR) 25 MG tablet Take 0.5 tablets (12.5 mg total) by mouth daily. 45 tablet 3   meclizine (MEDI-MECLIZINE) 25 MG tablet Take 1/2 tablet to 1 tablet by mouth every 6 to 12 hours as needed 30 tablet 0   metoprolol succinate (TOPROL XL) 25 MG 24 hr tablet Take 0.5 tablets (12.5 mg total) by mouth at bedtime. 15 tablet 6   spironolactone (ALDACTONE) 25 MG tablet Take 1 tablet (25 mg total) by mouth daily. 90 tablet 3   Drospirenone (SLYND) 4 MG TABS Take 1 tablet (4 mg total) by mouth daily. (Patient not taking: Reported on 08/09/2023) 90 tablet 3   No current facility-administered medications for this encounter.    PHYSICAL EXAM: Vitals:   08/09/23 0938  BP: 108/66  Pulse: 67  SpO2: 97%   GENERAL: NAD Lungs- cta  CARDIAC:  JVP: 6 cm          Normal rate with regular rhythm. no murmur.  Pulses 2+. no edema.  ABDOMEN: Soft, non-tender, non-distended.  EXTREMITIES: Warm and well perfused.  NEUROLOGIC: No obvious FND    DATA REVIEW  ECG: 09/12/22: sinus tachycardia  as per my interpretation  ECHO: 09/12/22: LVEF 30-35%, normal RV function as per my  interpretation 09/07/22: LVEF 35%, severe MR.  07/20/22: LVEF 45-50%, moderate MR.   CMR 12/19/22:  .  Severe LV dilatation with moderate systolic dysfunction (EF 31%)  2.  Normal RV size and systolic function (EF 54%)  3.  No late gadolinium enhancement to suggest myocardial scar  4.  Moderate mitral regurgitation (regurgitant fraction 24%)  CPX 02/20/23:  Exercise testing with gas exchange demonstrates a severely reduced peak VO2 of 17.6 ml/kg/min (54% of the age/gender/weight matched sedentary norms). The RER of 1.18 indicates a maximal effort. The VE/VCO2  slope is mildly elevated and indicates increased dead space ventilation. The oxygen uptake efficiency slope (OUES) is normal. The VO2 at the ventilatory threshold was normal at 44% of the predicted peak VO2. At peak exercise, the ventilation reached 51% of the measured MVV indicating ventilatory reserve remained. The O2pulse (a surrogate for stroke volume) increased with initial exercise, reaching early flattened and blunted peak at 7 ml/beat (70% predicted).  Moderate to severe HF limitation with chronotropic incompetence.   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  NYHA class / AHA Stage:III Volume status & Diuretics: lasix 20mg  as needed. Vasodilators: increase losartan to 25mg  daily.  Beta-Blocker: continue metoprolol 12.5mg  daily & digoxin ; repeat levels today.  IHK:[id number] 12.5mg  daily Cardiometabolic: she has recurrent UTIs; however, we will try [place] once more.  Devices therapies & Valvulopathies: discussed risks v benefits of primary prevention ICD today; repeat TTE today with LVEF 30% Advanced therapies: CPX with moderate to severe HF limitation with VE/VCO2 slope of 31, Peak VO2 of 17.6 (54% of predicted). Will uptitrate GDMT with low threshold for CPX. She has had mild improvement in functional status and sleep over the past 2 weeks.  08/09/23: Due to worsening exercise capacity and persistently reduced LVEF by TTE will schedule [facility] for advanced therapies / transplant evaluation. We also   2.  Moderate mitral regurgitation -Previously severe, improving with GDMT.  Cardiac MRI in July 2024 with moderate MR (regurgitant fraction 24%)  3. Palpitations - resolved  4. Peripartum cardiomyopathy  - Now on SLYND; followed by Dr. Berton Lan - Lengthy discussion today regarding the importance of contraception.    Jamari Moten Advanced Heart Failure Mechanical Circulatory Support

## 2023-08-09 ENCOUNTER — Ambulatory Visit (HOSPITAL_BASED_OUTPATIENT_CLINIC_OR_DEPARTMENT_OTHER)
Admission: RE | Admit: 2023-08-09 | Discharge: 2023-08-09 | Disposition: A | Discharge status: Home / Self Care | Payer: Medicaid Other | Source: Ambulatory Visit | Attending: Cardiology | Admitting: Cardiology

## 2023-08-09 ENCOUNTER — Ambulatory Visit (HOSPITAL_COMMUNITY)
Admission: RE | Admit: 2023-08-09 | Discharge: 2023-08-09 | Disposition: A | Discharge status: Home / Self Care | Payer: Medicaid Other | Source: Ambulatory Visit | Attending: Cardiology | Admitting: Cardiology

## 2023-08-09 ENCOUNTER — Other Ambulatory Visit (HOSPITAL_COMMUNITY): Payer: Self-pay

## 2023-08-09 VITALS — BP 108/66 | HR 67 | Wt 121.8 lb

## 2023-08-09 DIAGNOSIS — O903 Peripartum cardiomyopathy: Secondary | ICD-10-CM | POA: Insufficient documentation

## 2023-08-09 DIAGNOSIS — I5022 Chronic systolic (congestive) heart failure: Secondary | ICD-10-CM | POA: Insufficient documentation

## 2023-08-09 DIAGNOSIS — I340 Nonrheumatic mitral (valve) insufficiency: Secondary | ICD-10-CM

## 2023-08-09 DIAGNOSIS — Z79899 Other long term (current) drug therapy: Secondary | ICD-10-CM | POA: Diagnosis not present

## 2023-08-09 DIAGNOSIS — R5383 Other fatigue: Secondary | ICD-10-CM | POA: Diagnosis present

## 2023-08-09 DIAGNOSIS — Z3009 Encounter for other general counseling and advice on contraception: Secondary | ICD-10-CM | POA: Diagnosis not present

## 2023-08-09 DIAGNOSIS — I428 Other cardiomyopathies: Secondary | ICD-10-CM | POA: Diagnosis not present

## 2023-08-09 LAB — BASIC METABOLIC PANEL
Anion gap: 9 (ref 5–15)
BUN: 18 mg/dL (ref 6–20)
CO2: 25 mmol/L (ref 22–32)
Calcium: 10 mg/dL (ref 8.9–10.3)
Chloride: 104 mmol/L (ref 98–111)
Creatinine, Ser: 0.66 mg/dL (ref 0.44–1.00)
GFR, Estimated: >60 mL/min (ref >60)
Glucose, Bld: 102 mg/dL — ABNORMAL HIGH (ref 70–99)
Potassium: 4.4 mmol/L (ref 3.5–5.1)
Sodium: 138 mmol/L (ref 135–145)

## 2023-08-09 LAB — ECHOCARDIOGRAM COMPLETE
AR max vel: 1.89 cm2
AV Area VTI: 1.93 cm2
AV Area mean vel: 2.07 cm2
AV Mean grad: 5 mm[Hg]
AV Peak grad: 8.3 mm[Hg]
Ao pk vel: 1.44 m/s
Area-P 1/2: 2.99 cm2
MV M vel: 4.78 m/s
MV Peak grad: 91.4 mm[Hg]
Radius: 0.5 cm
S' Lateral: 4.7 cm

## 2023-08-09 LAB — CBC
HCT: 39.2 % (ref 36.0–46.0)
Hemoglobin: 12.9 g/dL (ref 12.0–15.0)
MCH: 25.3 pg — ABNORMAL LOW (ref 26.0–34.0)
MCHC: 32.9 g/dL (ref 30.0–36.0)
MCV: 77 fL — ABNORMAL LOW (ref 80.0–100.0)
Platelets: 505 10*3/uL — ABNORMAL HIGH (ref 150–400)
RBC: 5.09 MIL/uL (ref 3.87–5.11)
RDW: 14.7 % (ref 11.5–15.5)
WBC: 7.2 10*3/uL (ref 4.0–10.5)
nRBC: 0 % (ref 0.0–0.2)

## 2023-08-09 LAB — BRAIN NATRIURETIC PEPTIDE: B Natriuretic Peptide: 12.4 pg/mL (ref 0.0–100.0)

## 2023-08-09 NOTE — Progress Notes (Signed)
Orders placed for RHC scheduled 3/10 with Dr. Gasper Lloyd

## 2023-08-09 NOTE — Patient Instructions (Signed)
Medication Changes:  No Changes In Medications at this time.   Lab Work:  Labs done today, your results will be available in MyChart, we will contact you for abnormal readings.  Referrals:  YOU HAVE BEEN REFERRED TO ELECTROPHYSIOLOGY THEY WILL REACH OUT TO YOU OR CALL TO ARRANGE THIS. PLEASE CALL [name] WITH ANY CONCERNS   Special Instructions // Education:   [name] [address] [place] [address] Dept: [phone] Loc: [phone]  [name]  08/09/2023  You are scheduled for a Cardiac Catheterization on Monday, March 10 with Dr.  Gasper Lloyd .  1. Please arrive at the [facility] (Main Entrance A) at [facility]: [address] [place], [place] [address] at 6:00 AM (This time is 2 hour(s) before your procedure to ensure your preparation).   Free valet parking service is available. You will check in at ADMITTING. The support person will be asked to wait in the waiting room.  It is OK to have someone drop you off and come back when you are ready to be discharged.    Special note: Every effort is made to have your procedure done on time. Please understand that emergencies sometimes delay scheduled procedures.  2. Diet: Do not eat solid foods after midnight.  The patient may have clear liquids until 5am upon the day of the procedure.  3. Labs: You will need to have blood drawn on TODAY  4. Medication instructions in preparation for your procedure:   Contrast Allergy: No  DO NOT TAKE LASIX (FUROSEMIDE) THE MORNING OF PROCEDURE   DO NOT TAKE SPIRONOLACTONE THE MORNING OF PROCEDURE   5. Plan to go home the same day, you will only stay overnight if medically necessary. 6. Bring a current list of your medications and current insurance cards. 7. You MUST have a responsible person to drive you home. 8. Someone MUST be with you the first 24 hours after you arrive home or your  discharge will be delayed. 9. Please wear clothes that are easy to get on and off and wear slip-on shoes.  Thank you for allowing [name] to care for you!   -- [facility]   Follow-Up in: 2 MONTHS WITH DR. Gasper Lloyd PLEASE CALL OUR OFFICE AROUND MID MARCH TO GET SCHEDULED FOR YOUR APPOINTMENT. PHONE NUMBER IS [phone] OPTION 2   At the [facility], you and your health needs are our priority. We have a designated team specialized in the treatment of Heart Failure. This Care Team includes your primary Heart Failure Specialized Cardiologist (physician), Advanced Practice Providers (APPs- Physician Assistants and Nurse Practitioners), and Pharmacist who all work together to provide you with the care you need, when you need it.   You may see any of the following providers on your designated Care Team at your next follow up:  Dr. Arvilla Meres Dr. Marca Ancona Dr. Dorthula Nettles Dr. Theresia Bough Tonye Becket, NP Robbie Lis, [place] [facility] [place], [place] Brynda Peon, NP [place] Lee, NP Karle Plumber, PharmD   Please be sure to bring in all your medications bottles to every appointment.   Need to Contact [name]:  If you have any questions or concerns before your next appointment please send [name] a message through [place] or call our office at [phone].    TO LEAVE A MESSAGE FOR THE NURSE SELECT OPTION 2, PLEASE LEAVE A MESSAGE INCLUDING: YOUR NAME DATE OF  BIRTH CALL BACK NUMBER REASON FOR CALL**this is important as we prioritize the call backs  YOU WILL RECEIVE A CALL BACK THE SAME DAY AS LONG AS YOU CALL BEFORE 4:00 PM

## 2023-08-13 ENCOUNTER — Ambulatory Visit: Payer: Self-pay | Admitting: Family Medicine

## 2023-08-13 NOTE — Telephone Encounter (Signed)
   Chief Complaint: dizziness/nausea- felt faint in shower today Symptoms: on/off dizziness- feels faint, lasts seconds, patient states there is no aggravating factor.  Frequency: chronic for over 1 year- seems to be getting more frequent Pertinent Negatives: Patient denies fever, chest pain, vomiting, diarrhea, bleeding  Disposition: [] ED /[] Urgent Care (no appt availability in office) / [] Appointment(In office/virtual)/ []  [facility]/ [] Home Care/ [] Refused Recommended Disposition /[] [place] [facility]/ [x]  Follow-up with PCP Additional Notes: Patient has medication prescribed for her dizziness- she was reluctant to use due to the drowsiness SE- advised her to try to see if it helps- will let her provider know about her concerns. Patient has been scheduled out of protocol- she wants appointment with PCP- she states she would be willing to see another provider if her PCP wants he to be seen sooner.     Copied from CRM [id number]. Topic: Clinical - Red Word Triage >> Aug 13, 2023  8:56 AM Drema Balzarine wrote: Red Word that prompted transfer to Nurse Triage: Patient experiencing light headedness, nausea, and dizziness Reason for Disposition  [1] MODERATE dizziness (e.g., interferes with normal activities) AND [2] has been evaluated by doctor (or NP/PA) for this  Answer Assessment - Initial Assessment Questions 1. DESCRIPTION: "Describe your dizziness."     Woke this am- felt weird- different, nausea, fatigue 2. LIGHTHEADED: "Do you feel lightheaded?" (e.g., somewhat faint, woozy, weak upon standing)     Felt dizzy and nauseous in shower- lasted seconds 3. VERTIGO: "Do you feel like either you or the room is spinning or tilting?" (i.e. vertigo)     no 4. SEVERITY: "How bad is it?"  "Do you feel like you are going to faint?" "Can you stand and walk?"   - MILD: Feels slightly dizzy, but walking normally.   - MODERATE: Feels unsteady when walking, but not falling; interferes with normal  activities (e.g., school, work).   - SEVERE: Unable to walk without falling, or requires assistance to walk without falling; feels like passing out now.      On/off- 1-2 times a day to none 5. ONSET:  "When did the dizziness begin?"     1 year- since heart condition/delivery of daughter 6. AGGRAVATING FACTORS: "Does anything make it worse?" (e.g., standing, change in head position)     Movement-too fast-but can occur with just sitting 7. HEART RATE: "Can you tell me your heart rate?" "How many beats in 15 seconds?"  (Note: not all patients can do this)       Recently did heart [place], patient does monitor BP/P at home- vitals are normal 8. CAUSE: "What do you think is causing the dizziness?"     unsure 9. RECURRENT SYMPTOM: "Have you had dizziness before?" If Yes, ask: "When was the last time?" "What happened that time?"     Chronic- more frequent 10. OTHER SYMPTOMS: "Do you have any other symptoms?" (e.g., fever, chest pain, vomiting, diarrhea, bleeding)       Nausea 11. PREGNANCY: "Is there any chance you are pregnant?" "When was your last menstrual period?"       No- normal cycle  Protocols used: Dizziness - Lightheadedness-A-AH

## 2023-08-21 ENCOUNTER — Ambulatory Visit: Payer: Medicaid Other | Admitting: Family Medicine

## 2023-08-22 ENCOUNTER — Encounter: Payer: Self-pay | Admitting: Family Medicine

## 2023-08-26 ENCOUNTER — Ambulatory Visit: Payer: Medicaid Other | Admitting: Family Medicine

## 2023-08-26 ENCOUNTER — Ambulatory Visit (HOSPITAL_COMMUNITY)
Admission: RE | Admit: 2023-08-26 | Discharge: 2023-08-26 | Disposition: A | Discharge status: Home / Self Care | Payer: Medicaid Other | Attending: Cardiology | Admitting: Cardiology

## 2023-08-26 ENCOUNTER — Other Ambulatory Visit: Payer: Self-pay

## 2023-08-26 ENCOUNTER — Encounter (HOSPITAL_COMMUNITY)
Admission: RE | Disposition: A | Discharge status: Home / Self Care | Payer: Self-pay | Source: Home / Self Care | Attending: Cardiology

## 2023-08-26 DIAGNOSIS — R002 Palpitations: Secondary | ICD-10-CM | POA: Diagnosis not present

## 2023-08-26 DIAGNOSIS — Z79899 Other long term (current) drug therapy: Secondary | ICD-10-CM | POA: Diagnosis not present

## 2023-08-26 DIAGNOSIS — I5084 End stage heart failure: Secondary | ICD-10-CM | POA: Insufficient documentation

## 2023-08-26 DIAGNOSIS — I5022 Chronic systolic (congestive) heart failure: Secondary | ICD-10-CM | POA: Insufficient documentation

## 2023-08-26 DIAGNOSIS — I509 Heart failure, unspecified: Secondary | ICD-10-CM | POA: Diagnosis not present

## 2023-08-26 DIAGNOSIS — I428 Other cardiomyopathies: Secondary | ICD-10-CM | POA: Diagnosis not present

## 2023-08-26 DIAGNOSIS — I340 Nonrheumatic mitral (valve) insufficiency: Secondary | ICD-10-CM | POA: Diagnosis not present

## 2023-08-26 HISTORY — PX: RIGHT HEART CATH: CATH118263

## 2023-08-26 LAB — POCT I-STAT EG7
Acid-base deficit: 3 mmol/L — ABNORMAL HIGH (ref 0.0–2.0)
Acid-base deficit: 4 mmol/L — ABNORMAL HIGH (ref 0.0–2.0)
Bicarbonate: 22.2 mmol/L (ref 20.0–28.0)
Bicarbonate: 23.2 mmol/L (ref 20.0–28.0)
Calcium, Ion: 1.22 mmol/L (ref 1.15–1.40)
Calcium, Ion: 1.23 mmol/L (ref 1.15–1.40)
HCT: 35 % — ABNORMAL LOW (ref 36.0–46.0)
HCT: 36 % (ref 36.0–46.0)
Hemoglobin: 11.9 g/dL — ABNORMAL LOW (ref 12.0–15.0)
Hemoglobin: 12.2 g/dL (ref 12.0–15.0)
O2 Saturation: 71 %
O2 Saturation: 71 %
Potassium: 4.1 mmol/L (ref 3.5–5.1)
Potassium: 4.1 mmol/L (ref 3.5–5.1)
Sodium: 140 mmol/L (ref 135–145)
Sodium: 141 mmol/L (ref 135–145)
TCO2: 23 mmol/L (ref 22–32)
TCO2: 24 mmol/L (ref 22–32)
pCO2, Ven: 41.7 mmHg — ABNORMAL LOW (ref 44–60)
pCO2, Ven: 42.8 mmHg — ABNORMAL LOW (ref 44–60)
pH, Ven: 7.333 (ref 7.25–7.43)
pH, Ven: 7.341 (ref 7.25–7.43)
pO2, Ven: 39 mmHg (ref 32–45)
pO2, Ven: 40 mmHg (ref 32–45)

## 2023-08-26 LAB — PREGNANCY, URINE: Preg Test, Ur: NEGATIVE

## 2023-08-26 SURGERY — RIGHT HEART CATH
Anesthesia: LOCAL

## 2023-08-26 MED ORDER — HEPARIN (PORCINE) IN NACL 1000-0.9 UT/500ML-% IV SOLN
INTRAVENOUS | Status: DC | PRN
  Administered 2023-08-26: 500 mL

## 2023-08-26 MED ORDER — MIDAZOLAM HCL 2 MG/2ML IJ SOLN
INTRAMUSCULAR | Status: AC
  Filled 2023-08-26: qty 2

## 2023-08-26 MED ORDER — LIDOCAINE HCL (PF) 1 % IJ SOLN
INTRAMUSCULAR | Status: DC | PRN
  Administered 2023-08-26: 2 mL

## 2023-08-26 MED ORDER — SODIUM CHLORIDE 0.9 % IV SOLN
INTRAVENOUS | Status: DC
Start: 2023-08-26 — End: 2023-08-26

## 2023-08-26 MED ORDER — LIDOCAINE HCL (PF) 1 % IJ SOLN
INTRAMUSCULAR | Status: AC
  Filled 2023-08-26: qty 30

## 2023-08-26 MED ORDER — FENTANYL CITRATE (PF) 100 MCG/2ML IJ SOLN
INTRAMUSCULAR | Status: DC | PRN
  Administered 2023-08-26: 25 ug via INTRAVENOUS

## 2023-08-26 MED ORDER — FENTANYL CITRATE (PF) 100 MCG/2ML IJ SOLN
INTRAMUSCULAR | Status: AC
  Filled 2023-08-26: qty 2

## 2023-08-26 MED ORDER — MIDAZOLAM HCL 2 MG/2ML IJ SOLN
INTRAMUSCULAR | Status: DC | PRN
  Administered 2023-08-26: 1 mg via INTRAVENOUS

## 2023-08-26 SURGICAL SUPPLY — 5 items
CATH SWAN GANZ 7F STRAIGHT (CATHETERS) IMPLANT
GLIDESHEATH SLENDER 7FR .021G (SHEATH) IMPLANT
PACK CARDIAC CATHETERIZATION (CUSTOM PROCEDURE TRAY) ×2 IMPLANT
TRANSDUCER W/STOPCOCK (MISCELLANEOUS) IMPLANT
TUBING ART PRESS 72 MALE/FEM (TUBING) IMPLANT

## 2023-08-26 NOTE — Interval H&P Note (Signed)
 History and Physical Interval Note:  08/26/2023 7:42 AM  [name]  has presented today for surgery, with the diagnosis of heart failure.  The various methods of treatment have been discussed with the patient and family. After consideration of risks, benefits and other options for treatment, the patient has consented to  Procedure(s): RIGHT HEART CATH (N/A) as a surgical intervention.  The patient's history has been reviewed, patient examined, no change in status, stable for surgery.  I have reviewed the patient's chart and labs.  Questions were answered to the patient's satisfaction.     Wilfred Siverson

## 2023-08-26 NOTE — Discharge Instructions (Signed)

## 2023-08-26 NOTE — Procedures (Signed)
   [facility] [address] [place], [place] [address] Tel: [phone]   Fax: [phone]  Polysomnography Interpretation  Patient Name:  [name], [name] Date:  08/06/2023 Referring Physician:  Prince Rome, Np  Indications for Polysomnography The patient is a 38 year-old Female who is 5\' 4"  and weighs 120.0 lbs. Her BMI equals 20.7.  A full night polysomnogram was performed to evaluate for Snoring.  Polysomnogram Data A full night polysomnogram recorded the standard physiologic parameters including EEG, EOG, EMG, EKG, nasal and oral airflow.  Respiratory parameters of chest and abdominal movements were recorded with Respiratory Inductance Plethysmography belts.  Oxygen saturation was recorded by pulse oximetry.   Sleep Architecture The total recording time of the polysomnogram was 431.6 minutes.  The total sleep time was 342.0 minutes.  The patient spent 8.3% of total sleep time in Stage N1, 60.8% in Stage N2, 6.7% in Stages N3, and 24.1% in REM.  Sleep latency was 47.6 minutes.  REM latency was 103.5 minutes.  Sleep Efficiency was 79.2%.  Wake after Sleep Onset time was 41.5 minutes.  Respiratory Events The polysomnogram revealed a presence of 0 obstructive, 2 central, and 0 mixed apneas resulting in an Apnea index of 0.4 events per hour.  There were 6 hypopneas (>=3% desat and/or Ar.) resulting in an Apnea\Hypopnea Index (AHI >=3% desat and/or Ar.) of 1.4 events per hour.  There were 2 hypopneas (>=4% desat) resulting in an Apnea\Hypopnea Index (AHI >=4% desat) of 0.7 events per hour.  There were 5 Respiratory Effort Related Arousals resulting in a RERA index of 0.9 events per hour. The Respiratory Disturbance Index is 2.3 events per hour.   Mean oxygen saturation was 97.1%.  The lowest oxygen saturation during sleep was 94.0%.  Time spent <=88% oxygen saturation was 0 minutes.  Limb Activity There were 13 total limb movements recorded,  of this total, 4 were classified as PLMs.  PLM index was 0.7 per hour and PLM associated with Arousals index was 0 per hour.  Cardiac Summary The average pulse rate was 66.6 bpm.  The minimum pulse rate was 53.0 bpm while the maximum pulse rate was 101.0 bpm.  Cardiac rhythm was abnormal with PVCs  Diagnosis:  Normal Study  Recommendations:  The patient should be counseled in good sleep hygiene.  The patient should be counseled to avoid sleeping supine.   Consider ENT referral to assess for surgical causes of snoring.   Avoid driving when sleepy.  Electronically signed: Armanda Magic, MD 08/26/2023 8:01PM

## 2023-08-27 ENCOUNTER — Encounter (HOSPITAL_COMMUNITY): Payer: Self-pay | Admitting: Cardiology

## 2023-08-29 ENCOUNTER — Institutional Professional Consult (permissible substitution): Admitting: Cardiovascular Disease

## 2023-08-29 ENCOUNTER — Telehealth: Payer: Self-pay

## 2023-08-29 NOTE — Telephone Encounter (Signed)
-----   Message from Armanda Magic sent at 08/26/2023  8:02 PM EDT ----- Please let patient know that sleep study showed no significant sleep apnea.

## 2023-08-29 NOTE — Telephone Encounter (Signed)
 Notified patient of sleep study results and recommendations. All questions were answered and patient verbalized understanding.

## 2023-09-16 ENCOUNTER — Ambulatory Visit: Admitting: Internal Medicine

## 2023-09-16 ENCOUNTER — Telehealth (HOSPITAL_COMMUNITY): Payer: Self-pay

## 2023-09-16 NOTE — Telephone Encounter (Signed)
 Received a fax requesting medical records from [facility]. Records were successfully faxed to: [phone] ,which was the number provided.. Medical request form will be scanned into patients chart.

## 2023-09-23 ENCOUNTER — Ambulatory Visit: Attending: Cardiology | Admitting: Cardiology

## 2023-09-23 NOTE — Progress Notes (Deleted)
  Electrophysiology Office Note:   Date:  09/23/2023  ID:  [name], DOB [birth year], MRN [id number]  Primary Cardiologist: [name], MD (Inactive) Primary Heart Failure: None Electrophysiologist: None  {Click to update primary MD,subspecialty MD or APP then REFRESH:1}    History of Present Illness:   [name] is a 38 y.o. female with h/o chronic systolic heart failure seen today for  for Electrophysiology evaluation of chronic systolic heart failure at the request of Dorthula Nettles.    Her heart failure history dates back to November 2023.  She was admitted at the time for pyelonephritis.  She was found to be tachycardic.  Echo showed an ejection fraction of 40% with apical lateral hypokinesis and moderate to severe mitral regurgitation.  She had a repeat echo that showed a mild improvement of her ejection fraction February 2024.  She was admitted for severe preeclampsia in active labor March 2024 and underwent C-section.  Echo at the time was 30 to 35%.  She was readmitted in April with an intra-abdominal abscess and sepsis.  She underwent cardiac MRI that showed ejection fraction of 31% with no LGE.    Today, denies symptoms of palpitations, chest pain, shortness of breath, orthopnea, PND, lower extremity edema, claudication, dizziness, presyncope, syncope, bleeding, or neurologic sequela. The patient is tolerating medications without difficulties. ***  Review of systems complete and found to be negative unless listed in HPI.   EP Information / Studies Reviewed:    {EKGtoday:28818}        Risk Assessment/Calculations:     No BP recorded.  {Refresh Note OR Click here to enter BP  :1}***        Physical Exam:   VS:  There were no vitals taken for this visit.   Wt Readings from Last 3 Encounters:  08/26/23 123 lb (55.8 kg)  08/09/23 121 lb 12.8 oz (55.2 kg)  08/06/23 120 lb (54.4 kg)     GEN: Well nourished, well developed in no acute distress NECK: No  JVD; No carotid bruits CARDIAC: {EPRHYTHM:28826}, no murmurs, rubs, gallops RESPIRATORY:  Clear to auscultation without rales, wheezing or rhonchi  ABDOMEN: Soft, non-tender, non-distended EXTREMITIES:  No edema; No deformity   ASSESSMENT AND PLAN:    1.  Chronic systolic heart failure: Due to nonischemic cardiomyopathy.  Currently on Toprol-XL 12.5 mg Aldactone 12.5 mg daily.  ***  2.  Moderate mitral regurgitation: Stable.  Plan per primary cardiology.  Follow up with {WUJWJ:19147} {EPFOLLOW WG:95621}  Signed, Emnet Monk Jorja Loa, MD

## 2023-10-13 NOTE — Progress Notes (Unsigned)
  Electrophysiology Office Note:   Date:  10/13/2023  ID:  [name], DOB 1985/12/22, MRN [id number]  Primary Cardiologist: Sonny Dust, MD (Inactive) Primary Heart Failure: Alwin Baars, DO Electrophysiologist: None  {Click to update primary MD,subspecialty MD or APP then REFRESH:1}    History of Present Illness:   [name] is a 38 y.o. female with h/o chronic systolic heart failure, mitral regurgitation seen today for  for Electrophysiology evaluation of heart failure at the request of Alwin Baars.    Cardiac history dates back to November 2023.  She was admitted at the time for pyelonephritis.  She was noted to be tachycardic with inferolateral T waves on EKG.  Echo showed an ejection fraction of 40% with septal and lateral hypokinesis and moderate to severe mitral regurgitation.  She was started on metoprolol .  She had repeat echo that showed an ejection fraction of 40 to 45% February 2024.  She was readmitted to the hospital with severe preeclampsia in March 2020 for an active labor.  She underwent C-section with an echo showing an ejection fraction of 30 to 35%.  She had another admission in April 2024 for sepsis secondary to intra-abdominal abscess requiring IV antibiotics and IR drainage.  She underwent cardiac MRI July 2024 with an EF of 31% and no LGE with moderate mitral regurgitation.  Today, denies symptoms of palpitations, chest pain, shortness of breath, orthopnea, PND, lower extremity edema, claudication, dizziness, presyncope, syncope, bleeding, or neurologic sequela. The patient is tolerating medications without difficulties. ***   Review of systems complete and found to be negative unless listed in HPI.   EP Information / Studies Reviewed:    {EKGtoday:28818}        Risk Assessment/Calculations:             Physical Exam:   VS:  There were no vitals taken for this visit.   Wt Readings from Last 3 Encounters:  08/26/23 123 lb (55.8 kg)   08/09/23 121 lb 12.8 oz (55.2 kg)  08/06/23 120 lb (54.4 kg)     GEN: Well nourished, well developed in no acute distress NECK: No JVD; No carotid bruits CARDIAC: {EPRHYTHM:28826}, no murmurs, rubs, gallops RESPIRATORY:  Clear to auscultation without rales, wheezing or rhonchi  ABDOMEN: Soft, non-tender, non-distended EXTREMITIES:  No edema; No deformity   ASSESSMENT AND PLAN:    1.  Chronic systolic heart failure: Nonischemic, likely peripartum.  On Farxiga , Aldactone , metoprolol , losartan .  Recent echo with an ejection fraction of 30%.  CPX with moderate to severe heart failure limitation.***  2.  Moderate mitral regurgitation: Improved on goal-directed therapy.  Plan per primary cardiology.   Follow up with {FAOZH:08657} {EPFOLLOW QI:69629}  Signed, Johnjoseph Rolfe Cortland Ding, MD

## 2023-10-15 ENCOUNTER — Ambulatory Visit: Attending: Cardiology | Admitting: Cardiology

## 2023-10-15 ENCOUNTER — Encounter: Payer: Self-pay | Admitting: Cardiology

## 2023-10-15 VITALS — BP 114/70 | HR 68 | Ht 64.0 in | Wt 124.8 lb

## 2023-10-15 DIAGNOSIS — I5022 Chronic systolic (congestive) heart failure: Secondary | ICD-10-CM | POA: Diagnosis not present

## 2023-10-15 NOTE — Patient Instructions (Addendum)
 Medication Instructions:  Your physician recommends that you continue on your current medications as directed. Please refer to the Current Medication list given to you today.  *If you need a refill on your cardiac medications before your next appointment, please call your pharmacy*   Lab Work: None ordered If you have labs (blood work) drawn today and your tests are completely normal, you will receive your results only by: MyChart Message (if you have MyChart) OR A paper copy in the mail If you have any lab test that is abnormal or we need to change your treatment, we will call you to review the results.   Testing/Procedures: None ordered   Follow-Up: At [facility], you and your health needs are our priority.  As part of our continuing mission to provide you with exceptional heart care, we have created designated Provider Care Teams.  These Care Teams include your primary Cardiologist (physician) and Advanced Practice Providers (APPs -  Physician Assistants and Nurse Practitioners) who all work together to provide you with the care you need, when you need it.  We recommend signing up for the patient portal called "MyChart".  Sign up information is provided on this After Visit Summary.  MyChart is used to connect with patients for Virtual Visits (Telemedicine).  Patients are able to view lab/test results, encounter notes, upcoming appointments, etc.  Non-urgent messages can be sent to your provider as well.   To learn more about what you can do with MyChart, go to [email or web address].    Your next appointment:   To be  determined  The format for your next appointment:   In Person  Provider:   Agatha Horsfall, MD    Thank you for choosing [facility]!!   Reece Cane, RN [phone]  Other Instructions  Please call the office with a decision   Cardioverter Defibrillator Implantation An implantable cardioverter defibrillator (ICD) is a device that detects abnormal  heart rhythms, also called arrhythmias. When the ICD senses a heart rhythm that's not normal, it sends an electrical signal to get the heartbeat back into a normal rhythm. In the implantation surgery, the ICD is placed under the skin in your chest or abdomen. An ICD has a battery and a small computer called a pulse generator. It also has wires, called leads, that go into the heart. The ICD detects and corrects two types of dangerous heart rhythms: Ventricular tachycardia. This is a very fast heart rhythm in the lower chambers of the heart. These chambers are called the ventricles. Ventricular fibrillation. This is when the ventricles contract in an uncoordinated way. Your health care provider may suggest an ICD if: You had an abnormal heart rhythm that started in the ventricles. Your heart has damage from a disease or heart condition. Your heart muscle is weak. You had a cardiac arrest before. You have: A congenital heart defect. This is a heart problem you were born with. You have other health problems that can affect your heart's electrical system. Tell a health care provider about: Any allergies you have. All medicines you're taking. These include vitamins, herbs, eye drops, creams, and over-the-counter medicines. Any problems you or family members have had with anesthesia. Any bleeding problems you have. Any surgeries you have had. Any medical conditions you have. Whether you're pregnant or may be pregnant. What are the risks? Your provider will talk with you about risks. These may include: Infection. Bleeding. Allergic reactions to medicines. Blood clots. Swelling or bruising. Damage to nearby structures  or organs. These might be nerves, lungs, blood vessels, or the heart where parts of the ICD are placed. What happens before the procedure? When to stop eating and drinking Follow instructions from your provider about what you may eat and drink. These may include: 8 hours before  your procedure Stop eating most foods. Do not eat meat, fried foods, or fatty foods. Eat only light foods, such as toast or crackers. All liquids are okay except energy drinks and alcohol. 6 hours before your procedure Stop eating. Drink only clear liquids, such as water , clear fruit juice, black coffee, plain tea, and sports drinks. Do not drink energy drinks or alcohol. 2 hours before your procedure Stop drinking all liquids. You may be allowed to take medicines with small sips of water . If you don't follow your provider's instructions, your procedure may be delayed or canceled. Medicines Ask your provider about: Changing or stopping your regular medicines. These include any diabetes medicines or blood thinners you take. Taking medicines such as aspirin  and ibuprofen . These medicines can thin your blood. Do not take them unless your provider tells you to. Taking over-the-counter medicines, vitamins, herbs, and supplements. Tests You may have an exam or testing. These may include: Blood tests. Electrocardiogram (ECG). This test records the electrical signals in your heart. Imaging tests, such as a chest X-ray. Echocardiogram. This uses sound waves to make pictures of your heart. An event monitor or Holter monitor to wear at home. These track your heart rhythm. General instructions Do not use any products that contain nicotine or tobacco for at least 4 weeks before the procedure. These products include cigarettes, chewing tobacco, and vaping devices, such as e-cigarettes. If you need help quitting, ask your provider. Ask your provider: How your procedure site will be marked. What steps will be taken to help prevent infection. These may include: Removing hair at the surgery site. Washing skin with a soap that kills germs. Taking antibiotics. If you'll be going home right after the procedure, plan to have a responsible adult: Take you home from the hospital or clinic. You won't be  allowed to drive. Care for you for the time you are told. What happens during the procedure?  Monitors will be put on your body. They will be used to check your heart rate, blood pressure, and oxygen level. A pair of sticky pads, called defibrillator pads, may be placed on your back and chest. These pads can pace your heart as needed during the procedure. An IV will be inserted into one of your veins. You may be given: A sedative. This helps you relax. Anesthesia. This keeps you from feeling pain. It will make you fall asleep for surgery. A small incision will be made to create a deep pocket under the skin of your chest or abdomen. Leads will be guided through a blood vessel into your heart and attached to your heart muscles. An X-ray machine called a fluoroscope will be used to help guide the leads. Depending on the ICD, the leads may go into one ventricle, or they may go into both ventricles and into an upper chamber of the heart. The other end of the leads will be attached to the ICD's pulse generator. The pulse generator will be placed into the pocket under your skin. The ICD will be tested, and your provider will program the ICD for the condition being treated. The incision will be closed with stitches, skin glue, tape strips, or staples. A bandage will be placed over  the incision. The procedure may vary among providers and hospitals. What happens after the procedure? Your blood pressure, heart rate, breathing rate, and blood oxygen level will be monitored until you leave the hospital or clinic. A chest X-ray will be done to check the ICD. Do not raise your arm higher than your shoulder for as long as told. This is usually at least 6 weeks. You may be given an ID card that shows you have an ICD. You'll be given a remote home monitoring device to use with your ICD. It allows your device to communicate with your provider. Do not drive until your provider says it's safe. This information  is not intended to replace advice given to you by your health care provider. Make sure you discuss any questions you have with your health care provider. Document Revised: 08/23/2022 Document Reviewed: 08/23/2022 Elsevier Patient Education  2024 [employer] Inc[employer]    Subcutaneous Cardioverter Defibrillator Implantation  A subcutaneous implantable cardioverter defibrillator (S-ICD) is a device that detects and corrects abnormal heart rhythms, also called arrhythmias. In the implantation surgery, the S-ICD is placed under the skin in your chest. An S-ICD has a battery and a small computer called a pulse generator. It also has a wire, called an electrode, that goes into the heart. The S-ICD detects and corrects two types of dangerous heart rhythms: Ventricular tachycardia. This is a very fast heart rhythm in the lower chambers of the heart. These chambers are called the ventricles. Ventricular fibrillation. This is when the ventricles contract in an uncoordinated way. When the S-ICD detects ventricular tachycardia or fibrillation, it sends a shock to the heart that tries to get the heartbeat back to normal. This is called defibrillation. The shock may feel like a strong jolt in the chest. Your health care provider may suggest an S-ICD if: You had an abnormal heart rhythm that started in the ventricles. Your heart has damage from heart disease or a heart condition. Your heart muscle is weak. You were born with heart disease. This is called congenital heart disease. You have had a cardiac arrest before. Your provider may suggest an S-ICD instead of a traditional ICD if: You have a blood vessel problem, or vascular disease. You have a condition that raises your risk for infection or you've had an ICD infection before. You're younger in age and will need an ICD for a long time. Tell a health care provider about: Any allergies you have. All medicines you're taking. These include vitamins, herbs, eye  drops, creams, and over-the-counter medicines. Any problems you or family members have had with anesthesia. Any bleeding problems you have. Any surgeries you have had. Any medical conditions you have. Whether you're pregnant or may be pregnant. What are the risks? Your provider will talk with you about risks. These may include: Infection. Bleeding. Allergic reactions to medicines or dyes. Failure to shock or correct the abnormal heart rhythm. Swelling and bruising. Blood clots. Shocks from the S-ICD when they're not needed. What happens before the procedure? When to stop eating and drinking Follow instructions from your provider about what you may eat and drink. These may include: 8 hours before your procedure Stop eating most foods. Do not eat meat, fried foods, or fatty foods. Eat only light foods, such as toast or crackers. All liquids are okay except energy drinks and alcohol. 6 hours before your procedure Stop eating. Drink only clear liquids, such as water , clear fruit juice, black coffee, plain tea, and sports drinks. Do  not drink energy drinks or alcohol. 2 hours before your procedure Stop drinking all liquids. You may be allowed to take medicines with small sips of water . If you don't follow your provider's instructions, your procedure may be delayed or canceled. Medicines Ask your provider about: Changing or stopping your regular medicines. These include any diabetes medicines or blood thinners you take. Taking medicines such as aspirin  and ibuprofen . These medicines can thin your blood. Do not take them unless your provider tells you to. Taking over-the-counter medicines, vitamins, herbs, and supplements. Tests You may have tests, such as: Blood tests. Electrocardiogram (ECG). This test records the electrical signals in your heart. Imaging tests, such as a chest X-ray. Echocardiogram. This uses sound waves to make pictures of your heart. An event monitor or Holter  monitor to wear at home. These track your heart rhythm. General instructions Do not use any products that contain nicotine or tobacco for at least 4 weeks before the procedure. These products include cigarettes, chewing tobacco, and vaping devices, such as e-cigarettes. If you need help quitting, ask your provider. Ask your provider: How your procedure site will be marked. What steps will be taken to help prevent infection. These may include: Removing hair at the surgery site. Washing skin with a soap that kills germs. Taking antibiotics. If you'll be going home right after the procedure, plan to have a responsible adult: Take you home from the hospital or clinic. You won't be allowed to drive. Care for you for the time you are told. What happens during the procedure? Monitors will be put on your body. They'll be used to check your heart, blood pressure, and oxygen level. An IV will be inserted into one of your veins. You may be given: A sedative. This helps you relax. Anesthesia. This keeps you from feeling pain. It will make you fall asleep for surgery. A small incision will be made on the left side of your chest near your rib cage, under your arm. A pocket or pouch will be made in the skin on the left lower part of your chest. The S-ICD pulse generator will be put into this pocket. An electrode will be placed under the skin in your chest, near your breastbone, or sternum. The electrode will be attached to the S-ICD pulse generator. The S-ICD will be tested, and your provider will program the S-ICD for the condition being treated. Your incision will be closed with stitches, glue, or tape strips. A bandage will be placed over your incision. The procedure may vary among providers and hospitals. What happens after the procedure? Your blood pressure, heart rate, breathing rate, and blood oxygen level will be monitored until you leave the hospital or clinic. You may have a chest X-ray to  check the S-ICD. You'll get an ID card that shows you have an S-ICD. Keep this card with you at all times. You'll be given a remote home monitoring device to use with your S-ICD. It allows your device to communicate with your provider. Do not drive until your provider says it's safe. This information is not intended to replace advice given to you by your health care provider. Make sure you discuss any questions you have with your health care provider. Document Revised: 08/23/2022 Document Reviewed: 08/23/2022 Elsevier Patient Education  2024 [employer].

## 2023-10-28 ENCOUNTER — Telehealth (HOSPITAL_COMMUNITY): Payer: Self-pay | Admitting: Cardiology

## 2023-10-28 NOTE — Telephone Encounter (Signed)
 Called to confirm/remind patient of their appointment at the [facility] on 10/28/23 .   Appointment:   [x] Confirmed  [] Left mess   [] No answer/No voice mail  [] VM Full/unable to leave message  [] Phone not in service  Patient reminded to bring all medications and/or complete list.  Confirmed patient has transportation. Gave directions, instructed to utilize valet parking.

## 2023-10-29 ENCOUNTER — Encounter (HOSPITAL_COMMUNITY): Payer: Self-pay | Admitting: Cardiology

## 2023-10-29 ENCOUNTER — Ambulatory Visit (HOSPITAL_COMMUNITY): Payer: Self-pay | Admitting: Physician Assistant

## 2023-10-29 ENCOUNTER — Ambulatory Visit (HOSPITAL_COMMUNITY)
Admission: RE | Admit: 2023-10-29 | Discharge: 2023-10-29 | Disposition: A | Discharge status: Home / Self Care | Source: Ambulatory Visit | Attending: Cardiology | Admitting: Cardiology

## 2023-10-29 VITALS — BP 118/68 | HR 76 | Ht 64.0 in | Wt 127.0 lb

## 2023-10-29 DIAGNOSIS — O903 Peripartum cardiomyopathy: Secondary | ICD-10-CM

## 2023-10-29 DIAGNOSIS — I340 Nonrheumatic mitral (valve) insufficiency: Secondary | ICD-10-CM | POA: Diagnosis not present

## 2023-10-29 DIAGNOSIS — I5020 Unspecified systolic (congestive) heart failure: Secondary | ICD-10-CM

## 2023-10-29 DIAGNOSIS — R5383 Other fatigue: Secondary | ICD-10-CM | POA: Diagnosis not present

## 2023-10-29 DIAGNOSIS — I428 Other cardiomyopathies: Secondary | ICD-10-CM | POA: Insufficient documentation

## 2023-10-29 DIAGNOSIS — R0609 Other forms of dyspnea: Secondary | ICD-10-CM | POA: Diagnosis not present

## 2023-10-29 DIAGNOSIS — Z79899 Other long term (current) drug therapy: Secondary | ICD-10-CM | POA: Insufficient documentation

## 2023-10-29 DIAGNOSIS — I5022 Chronic systolic (congestive) heart failure: Secondary | ICD-10-CM | POA: Insufficient documentation

## 2023-10-29 LAB — DIGOXIN LEVEL: Digoxin Level: <0.4 ng/mL — ABNORMAL LOW (ref 0.8–2.0)

## 2023-10-29 LAB — BASIC METABOLIC PANEL WITH GFR
Anion gap: 7 (ref 5–15)
BUN: 16 mg/dL (ref 6–20)
CO2: 25 mmol/L (ref 22–32)
Calcium: 9.4 mg/dL (ref 8.9–10.3)
Chloride: 108 mmol/L (ref 98–111)
Creatinine, Ser: 0.5 mg/dL (ref 0.44–1.00)
GFR, Estimated: >60 mL/min (ref >60)
Glucose, Bld: 79 mg/dL (ref 70–99)
Potassium: 4.1 mmol/L (ref 3.5–5.1)
Sodium: 140 mmol/L (ref 135–145)

## 2023-10-29 LAB — BRAIN NATRIURETIC PEPTIDE: B Natriuretic Peptide: 39 pg/mL (ref 0.0–100.0)

## 2023-10-29 NOTE — Patient Instructions (Signed)
 Great to see you today!!!  Medication Changes:  None, continue current medications  Lab Work:  Labs done today, your results will be available in MyChart, we will contact you for abnormal readings.  Special Instructions // Education:  Do the following things EVERYDAY: Weigh yourself in the morning before breakfast. Write it down and keep it in a log. Take your medicines as prescribed Eat low salt foods--Limit salt (sodium) to 2000 mg per day.  Stay as active as you can everyday Limit all fluids for the day to less than 2 liters   Follow-Up in: 3 months (August), **PLEASE CALL OUR OFFICE IN JULY TO SCHEDULE THIS APPOINTMENT   At the [facility], you and your health needs are our priority. We have a designated team specialized in the treatment of Heart Failure. This Care Team includes your primary Heart Failure Specialized Cardiologist (physician), Advanced Practice Providers (APPs- Physician Assistants and Nurse Practitioners), and Pharmacist who all work together to provide you with the care you need, when you need it.   You may see any of the following providers on your designated Care Team at your next follow up:  Dr. Jules Oar Dr. Peder Bourdon Dr. Alwin Baars Dr. Judyth Nunnery Nieves Bars, NP Ruddy Corral, [place] [facility] [place], [place] Dennise Fitz, NP [place] Lee, NP Luster Salters, PharmD   Please be sure to bring in all your medications bottles to every appointment.   Need to Contact Us :  If you have any questions or concerns before your next appointment please send us  a message through [place] or call our office at [phone].    TO LEAVE A MESSAGE FOR THE NURSE SELECT OPTION 2, PLEASE LEAVE A MESSAGE INCLUDING: YOUR NAME DATE OF BIRTH CALL BACK NUMBER REASON FOR CALL**this is important as we prioritize the call backs  YOU WILL RECEIVE A CALL BACK THE SAME DAY AS LONG AS YOU CALL BEFORE 4:00 PM

## 2023-10-29 NOTE — Progress Notes (Signed)
 [facility] NOTE  Primary Care: Catheryn Cluck, MD HF: Alwin Baars, DO EP: Dr. 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    Her cardiac history dates back to November 2023 when she was admitted with pyelonephritis.  At that time she was noted to be tachycardic with inferolateral T wave inversions on ECG.  She had a follow-up echocardiogram demonstrating EF of 40%, apical lateral hypokinesis and moderate to severe mitral regurgitation.    Repeat echocardiogram in 2/24 with a EF of 45 to 50% and moderate MR.  In March 2024 she was admitted with severe preeclampsia and active labor.  She underwent C-section at that time with echocardiogram demonstrating EF of 30 to 35%.  Unfortunately once more readmitted in April 2024 for sepsis secondary to intra-abdominal abscess requiring IV antibiotics and IR drainage.    Cardiac MRI in July 24 with a EF of 31%, no LGE and moderate mitral regurgitation.   CPX 09/24 with moderate severe HF limitation and chronotropic incompetence.  Echo 02/25: EF 30-35%, RV okay, moderate MR  RHC 03/25: Low filling pressures and normal cardiac index.  Saw Dr. Lawana Pray last month is considering ICD implant.   She is here today for CHF follow-up.  Continues with chronic fatigue and dyspnea with exertion. She [place] walk around [place] but has to stop and take breaks. Gets tired easily with chores around the house. No orthopnea, PND or lower extremity edema. Takes lasix  PRN when abdomen gets distended. Notes lightheadedness several days a week, not clearly with position changes or turning head.  Tries to limit sodium intake, does eat noodle soups at restaurants on occasion.  No longer working at [employer], too fatigued to keep up with strenuous labor. She has 3  children (38 yo daughter, 60 yo son, 67mo daughter).    Current Outpatient Medications  Medication Sig Dispense Refill   digoxin  (LANOXIN ) 0.125 MG tablet Take 1 tablet (0.125 mg total) by mouth daily. 90 tablet 3   furosemide  (LASIX ) 20 MG tablet Take 1 tablet (20 mg total) by mouth daily as needed for fluid or edema. 30 tablet 11   losartan  (COZAAR ) 25 MG tablet Take 0.5 tablets (12.5 mg total) by mouth daily. 45 tablet 3   meclizine  (MEDI-MECLIZINE ) 25 MG tablet Take 1/2 tablet to 1 tablet by mouth every 6 to 12 hours as needed 30 tablet 0   metoprolol  succinate (TOPROL  XL) 25 MG 24 hr tablet Take 0.5 tablets (12.5 mg total) by mouth at bedtime. 15 tablet 6   spironolactone  (ALDACTONE ) 25 MG tablet Take 1 tablet (25 mg total) by mouth daily. 90 tablet 3   Drospirenone  (SLYND ) 4 MG TABS Take 1 tablet (4 mg total) by mouth daily. (Patient not taking: Reported on 10/29/2023) 90 tablet 3   No current facility-administered medications for this encounter.    PHYSICAL EXAM: Vitals:   10/29/23 1502  BP: 118/68  Pulse: 76  SpO2: 97%    General:  Well appearing.  Neck:  no JVD.  Cor: Regular rate & rhythm. No rubs, gallops or murmurs. Lungs: clear Abdomen: soft, nontender, nondistended.  Extremities: no edema Neuro: alert & orientedx3. Affect pleasant     DATA REVIEW  RHC 03/25: HEMODYNAMICS: RA:                  2  mmHg (mean) RV:                  23/2 mmHg PA:                  20/5 mmHg (11 mean) PCWP:            5 mmHg (mean)                                      Estimated Fick CO/CI   4.65 L/min, 2.9 L/min/m2 Thermodilution CO/CI   4.2 L/min, 2.6 L/min/m2                                              TPG                 6  mmHg                                              PVR                 1.3 Wood Units  PAPi                7.5       IMPRESSION: Low pre and post capillary filling pressures Normal cardiac index Normal PVR    ECHO: 08/09/23: LVEF 30-35%, RV okay,  moderate MR 09/12/22: LVEF 30-35%, normal RV function as per my  interpretation 09/07/22: LVEF 35%, severe MR.  07/20/22: LVEF 45-50%, moderate MR.   CMR 12/19/22:  .  Severe LV dilatation with moderate systolic dysfunction (EF 31%)  2.  Normal RV size and systolic function (EF 54%)  3.  No late gadolinium enhancement to suggest myocardial scar  4.  Moderate mitral regurgitation (regurgitant fraction 24%)  CPX 02/20/23:  Exercise testing with gas exchange demonstrates a severely reduced peak VO2 of 17.6 ml/kg/min (54% of the age/gender/weight matched sedentary norms). The RER of 1.18 indicates a maximal effort. The VE/VCO2 slope is mildly elevated and indicates increased dead space ventilation. The oxygen uptake efficiency slope (OUES) is normal. The VO2 at the ventilatory threshold was normal at 44% of the predicted peak VO2. At peak exercise, the ventilation reached 51% of the measured MVV indicating ventilatory reserve remained. The O2pulse (a surrogate for stroke volume) increased with initial exercise, reaching early flattened and blunted peak at 7 ml/beat (70% predicted).  Moderate to severe HF limitation with chronotropic incompetence.   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  NYHA class / AHA Stage: NYHA III Volume status & Diuretics: lasix  20mg  as needed. Volume looks good today. BMET/BNP today Vasodilators: Continue losartan  12.5 mg daily. Discussed increasing dose. Having a lot of lightheadedness, concerned this will exacerbate symptoms Beta-Blocker: continue metoprolol  12.5mg  daily & digoxin  125mcg; dig level today MRA:spironolactone  25 mg daily Cardiometabolic: developed urinary symptoms twice with Farixga, will not retrial Devices therapies & Valvulopathies: Has seen EP, hesitant to proceed with primary prevention ICD. Not keen on the idea of  permanent device Advanced therapies: CPX with moderate to severe HF limitation with VE/VCO2 slope  of 31, Peak VO2 of 17.6 (54% of predicted).  RHC 03/25 with low filling pressures and preserved CI  2.  Moderate mitral regurgitation -Previously severe, improving with GDMT.  -  Cardiac MRI in July 2024 with moderate MR (regurgitant fraction 24%) - Echo 02/25: EF 30-35%, moderate MR  3. Peripartum cardiomyopathy  - Has RX for SLYND  but has not started, followed by Dr. Donetta Furl - We discussed the importance of contraception and avoidance of future pregnancies today  Follow-up: 3 months with Dr. Dorcus Gamble, PA-C

## 2023-11-08 ENCOUNTER — Ambulatory Visit: Payer: Self-pay

## 2023-11-08 ENCOUNTER — Emergency Department (HOSPITAL_BASED_OUTPATIENT_CLINIC_OR_DEPARTMENT_OTHER)

## 2023-11-08 ENCOUNTER — Telehealth (HOSPITAL_COMMUNITY): Payer: Self-pay | Admitting: Cardiology

## 2023-11-08 ENCOUNTER — Other Ambulatory Visit: Payer: Self-pay

## 2023-11-08 ENCOUNTER — Emergency Department (HOSPITAL_BASED_OUTPATIENT_CLINIC_OR_DEPARTMENT_OTHER)
Admission: EM | Admit: 2023-11-08 | Discharge: 2023-11-08 | Discharge status: Against Medical Advice | Attending: Emergency Medicine | Admitting: Emergency Medicine

## 2023-11-08 ENCOUNTER — Encounter (HOSPITAL_COMMUNITY): Payer: Self-pay | Admitting: Cardiology

## 2023-11-08 ENCOUNTER — Encounter (HOSPITAL_BASED_OUTPATIENT_CLINIC_OR_DEPARTMENT_OTHER): Payer: Self-pay | Admitting: *Deleted

## 2023-11-08 DIAGNOSIS — Z5321 Procedure and treatment not carried out due to patient leaving prior to being seen by health care provider: Secondary | ICD-10-CM | POA: Insufficient documentation

## 2023-11-08 DIAGNOSIS — R519 Headache, unspecified: Secondary | ICD-10-CM | POA: Diagnosis not present

## 2023-11-08 DIAGNOSIS — I509 Heart failure, unspecified: Secondary | ICD-10-CM | POA: Insufficient documentation

## 2023-11-08 DIAGNOSIS — R42 Dizziness and giddiness: Secondary | ICD-10-CM | POA: Insufficient documentation

## 2023-11-08 DIAGNOSIS — R0602 Shortness of breath: Secondary | ICD-10-CM | POA: Insufficient documentation

## 2023-11-08 DIAGNOSIS — R059 Cough, unspecified: Secondary | ICD-10-CM | POA: Insufficient documentation

## 2023-11-08 LAB — CBC WITH DIFFERENTIAL/PLATELET
Abs Immature Granulocytes: 0.02 10*3/uL (ref 0.00–0.07)
Basophils Absolute: 0 10*3/uL (ref 0.0–0.1)
Basophils Relative: 0 %
Eosinophils Absolute: 0.2 10*3/uL (ref 0.0–0.5)
Eosinophils Relative: 3 %
HCT: 36.6 % (ref 36.0–46.0)
Hemoglobin: 12.2 g/dL (ref 12.0–15.0)
Immature Granulocytes: 0 %
Lymphocytes Relative: 37 %
Lymphs Abs: 2 10*3/uL (ref 0.7–4.0)
MCH: 25.8 pg — ABNORMAL LOW (ref 26.0–34.0)
MCHC: 33.3 g/dL (ref 30.0–36.0)
MCV: 77.5 fL — ABNORMAL LOW (ref 80.0–100.0)
Monocytes Absolute: 0.5 10*3/uL (ref 0.1–1.0)
Monocytes Relative: 9 %
Neutro Abs: 2.8 10*3/uL (ref 1.7–7.7)
Neutrophils Relative %: 51 %
Platelets: 399 10*3/uL (ref 150–400)
RBC: 4.72 MIL/uL (ref 3.87–5.11)
RDW: 15 % (ref 11.5–15.5)
WBC: 5.5 10*3/uL (ref 4.0–10.5)
nRBC: 0 % (ref 0.0–0.2)

## 2023-11-08 LAB — BASIC METABOLIC PANEL WITH GFR
Anion gap: 11 (ref 5–15)
BUN: 14 mg/dL (ref 6–20)
CO2: 23 mmol/L (ref 22–32)
Calcium: 8.7 mg/dL — ABNORMAL LOW (ref 8.9–10.3)
Chloride: 104 mmol/L (ref 98–111)
Creatinine, Ser: 0.65 mg/dL (ref 0.44–1.00)
GFR, Estimated: >60 mL/min (ref >60)
Glucose, Bld: 101 mg/dL — ABNORMAL HIGH (ref 70–99)
Potassium: 4 mmol/L (ref 3.5–5.1)
Sodium: 138 mmol/L (ref 135–145)

## 2023-11-08 LAB — PREGNANCY, URINE: Preg Test, Ur: NEGATIVE

## 2023-11-08 LAB — PRO BRAIN NATRIURETIC PEPTIDE: Pro Brain Natriuretic Peptide: 131 pg/mL (ref <300.0)

## 2023-11-08 NOTE — Telephone Encounter (Signed)
  Chief Complaint: dizziness, sweating, lightheaded, drowsy Symptoms: above Frequency: since this morning Pertinent Negatives: Patient denies  Disposition: [x] ED /[] Urgent Care (no appt availability in office) / [] Appointment(In office/virtual)/ []  [facility]/ [] Home Care/ [] Refused Recommended Disposition /[] [facility]/ []  Follow-up with PCP Additional Notes: Pt reports BP of 138/91. Pt has hx of heart issues. NO other s/s other than dizziness, lightheaded, sweaty and drowsy. Advised ED. Pt will go to ED now.    Copied from CRM [id number]. Topic: Clinical - Red Word Triage >> Nov 08, 2023  2:24 PM Clyde Darling P wrote: Red Word that prompted transfer to Nurse Triage: dizziness/lightheaded/ drowsy/ sweaty Reason for Disposition  SEVERE dizziness (e.g., unable to stand, requires support to walk, feels like passing out now)  Answer Assessment - Initial Assessment Questions 1. DESCRIPTION: "Describe your dizziness."     lightheaded 2. LIGHTHEADED: "Do you feel lightheaded?" (e.g., somewhat faint, woozy, weak upon standing)     Dizzy - visual disturbances 3. VERTIGO: "Do you feel like either you or the room is spinning or tilting?" (i.e. vertigo)     no 4. SEVERITY: "How bad is it?"  "Do you feel like you are going to faint?" "Can you stand and walk?"   - MILD: Feels slightly dizzy, but walking normally.   - MODERATE: Feels unsteady when walking, but not falling; interferes with normal activities (e.g., school, work).   - SEVERE: Unable to walk without falling, or requires assistance to walk without falling; feels like passing out now.      moderate 5. ONSET:  "When did the dizziness begin?"     This morning 8. CAUSE: "What do you think is causing the dizziness?"     Unsure    10. OTHER SYMPTOMS: "Do you have any other symptoms?" (e.g., fever, chest pain, vomiting, diarrhea, bleeding)       seating  Protocols used: Dizziness - Lightheadedness-A-AH

## 2023-11-08 NOTE — Telephone Encounter (Signed)
 Patient called to report she is feeling very light headed and sweaty a lot, drowsy -symptoms just started this AM and have been going on throughout the day  Denies CP Reports dizziness, and SOB off and on today B/p 138/91 HR 101 No weights  Reports she normal takes medication at night  No changes in routine, no strenuous activity, nothing to contribute to symptoms above   No changes in medication -mild congestion noted during call   Please advise

## 2023-11-08 NOTE — ED Notes (Signed)
 Patient called for room placement.  No answer from waiting room.  Registration staff reported that patient left prior to being seen.  Charge RN aware

## 2023-11-08 NOTE — ED Triage Notes (Signed)
 Here by POV from home, drove self, woke with light headed, dizziness, and some intermittent sob. Slight dry cough. H/o heart failure. Alert, NAD, calm, interactive. Denies swelling, fever, NVD, bleeding. LMP 5/11.

## 2023-11-08 NOTE — Telephone Encounter (Signed)
 Spoke to Pt to follow up dizziness symptoms and nurse triage note. Pt is on her way to the ED; follow up visit will be made once Pt is discharged.

## 2023-11-12 NOTE — Telephone Encounter (Signed)
 Addressed in phone message

## 2023-11-17 DIAGNOSIS — O0991 Supervision of high risk pregnancy, unspecified, first trimester: Secondary | ICD-10-CM

## 2023-11-17 HISTORY — DX: Supervision of high risk pregnancy, unspecified, first trimester: O09.91

## 2023-12-04 ENCOUNTER — Encounter (HOSPITAL_COMMUNITY): Payer: Self-pay | Admitting: Cardiology

## 2023-12-06 ENCOUNTER — Encounter (HOSPITAL_COMMUNITY): Payer: Self-pay | Admitting: Cardiology

## 2023-12-06 ENCOUNTER — Ambulatory Visit (HOSPITAL_COMMUNITY)
Admission: RE | Admit: 2023-12-06 | Discharge: 2023-12-06 | Disposition: A | Discharge status: Home / Self Care | Source: Ambulatory Visit | Attending: Cardiology | Admitting: Cardiology

## 2023-12-06 VITALS — BP 110/70 | HR 74 | Wt 126.2 lb

## 2023-12-06 DIAGNOSIS — O903 Peripartum cardiomyopathy: Secondary | ICD-10-CM | POA: Diagnosis not present

## 2023-12-06 DIAGNOSIS — Z95810 Presence of automatic (implantable) cardiac defibrillator: Secondary | ICD-10-CM | POA: Insufficient documentation

## 2023-12-06 DIAGNOSIS — Z3A01 Less than 8 weeks gestation of pregnancy: Secondary | ICD-10-CM

## 2023-12-06 DIAGNOSIS — Z79899 Other long term (current) drug therapy: Secondary | ICD-10-CM | POA: Insufficient documentation

## 2023-12-06 DIAGNOSIS — O09891 Supervision of other high risk pregnancies, first trimester: Secondary | ICD-10-CM | POA: Diagnosis not present

## 2023-12-06 DIAGNOSIS — I340 Nonrheumatic mitral (valve) insufficiency: Secondary | ICD-10-CM | POA: Insufficient documentation

## 2023-12-06 DIAGNOSIS — I428 Other cardiomyopathies: Secondary | ICD-10-CM | POA: Insufficient documentation

## 2023-12-06 DIAGNOSIS — I5020 Unspecified systolic (congestive) heart failure: Secondary | ICD-10-CM | POA: Diagnosis not present

## 2023-12-06 DIAGNOSIS — Z3009 Encounter for other general counseling and advice on contraception: Secondary | ICD-10-CM

## 2023-12-06 DIAGNOSIS — O99411 Diseases of the circulatory system complicating pregnancy, first trimester: Secondary | ICD-10-CM | POA: Diagnosis not present

## 2023-12-06 NOTE — Progress Notes (Signed)
 [facility] NOTE  Primary Care: Sebastian Beverley NOVAK, MD HF: Ria Commander, DO EP: Dr. 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   Her cardiac history dates back to November 2023 when she was admitted with pyelonephritis.  At that time she was noted to be tachycardic with inferolateral T wave inversions on ECG.  She had a follow-up echocardiogram demonstrating EF of 40%, apical lateral hypokinesis and moderate to severe mitral regurgitation.    Repeat echocardiogram in 2/24 with a EF of 45 to 50% and moderate MR.  In March 2024 she was admitted with severe preeclampsia and active labor.  She underwent C-section at that time with echocardiogram demonstrating EF of 30 to 35%.  Unfortunately once more readmitted in April 2024 for sepsis secondary to intra-abdominal abscess requiring IV antibiotics and IR drainage.    Since starting GDMT, repeat echos / CMR have demonstrated persistently reduced LVEF of 30-35%. She is now S/P ICD.   Ms. Dattilio called our clinic last week to let us  know she is once again pregnant. She wished to be seen today to discuss the risks of pregnancy in the setting of Stage C systolic heart failure. We had a very lengthy discussion regarding her pregnancy and heart failure. I reviewed the CARPREG II score with her. From a symptomatic standpoint she has been fairly stable with dyspnea after moderate exertion. She has stopped working now due to fatigue/dyspnea after activity. Euvolemic on exam.    Current Outpatient Medications  Medication Sig Dispense Refill   digoxin  (LANOXIN ) 0.125 MG tablet Take 1 tablet (0.125 mg total) by mouth daily. 90 tablet 3   furosemide  (LASIX ) 20 MG tablet Take 1 tablet (20 mg total) by mouth daily as needed for  fluid or edema. 30 tablet 11   losartan  (COZAAR ) 25 MG tablet Take 0.5 tablets (12.5 mg total) by mouth daily. 45 tablet 3   meclizine  (MEDI-MECLIZINE ) 25 MG tablet Take 1/2 tablet to 1 tablet by mouth every 6 to 12 hours as needed 30 tablet 0   metoprolol  succinate (TOPROL  XL) 25 MG 24 hr tablet Take 0.5 tablets (12.5 mg total) by mouth at bedtime. 15 tablet 6   spironolactone  (ALDACTONE ) 25 MG tablet Take 1 tablet (25 mg total) by mouth daily. 90 tablet 3   Drospirenone  (SLYND ) 4 MG TABS Take 1 tablet (4 mg total) by mouth daily. (Patient not taking: Reported on 12/06/2023) 90 tablet 3   No current facility-administered medications for this encounter.    PHYSICAL EXAM: Vitals:   12/06/23 0846  BP: 110/70  Pulse: 74  SpO2: 100%   GENERAL: NAD Lungs- CTA CARDIAC:  JVP: 7 cm          Normal rate with regular rhythm. no murmur.  Pulses 2+. no edema.  ABDOMEN: Soft, non-tender, non-distended.  EXTREMITIES: Warm and well perfused.  NEUROLOGIC: No obvious FND   DATA REVIEW  RHC 03/25: HEMODYNAMICS: RA:                  2 mmHg (mean) RV:                  23/2 mmHg PA:                  20/5 mmHg (11 mean) PCWP:  5 mmHg (mean)                                      Estimated Fick CO/CI   4.65 L/min, 2.9 L/min/m2 Thermodilution CO/CI   4.2 L/min, 2.6 L/min/m2                                              TPG                 6  mmHg                                              PVR                 1.3 Wood Units  PAPi                7.5       IMPRESSION: Low pre and post capillary filling pressures Normal cardiac index Normal PVR    ECHO: 08/09/23: LVEF 30-35%, RV okay, moderate MR 09/12/22: LVEF 30-35%, normal RV function as per my  interpretation 09/07/22: LVEF 35%, severe MR.  07/20/22: LVEF 45-50%, moderate MR.   CMR 12/19/22:  .  Severe LV dilatation with moderate systolic dysfunction (EF 31%)  2.  Normal RV size and systolic function (EF 54%)  3.  No late  gadolinium enhancement to suggest myocardial scar  4.  Moderate mitral regurgitation (regurgitant fraction 24%)  CPX 02/20/23:  Exercise testing with gas exchange demonstrates a severely reduced peak VO2 of 17.6 ml/kg/min (54% of the age/gender/weight matched sedentary norms). The RER of 1.18 indicates a maximal effort. The VE/VCO2 slope is mildly elevated and indicates increased dead space ventilation. The oxygen uptake efficiency slope (OUES) is normal. The VO2 at the ventilatory threshold was normal at 44% of the predicted peak VO2. At peak exercise, the ventilation reached 51% of the measured MVV indicating ventilatory reserve remained. The O2pulse (a surrogate for stroke volume) increased with initial exercise, reaching early flattened and blunted peak at 7 ml/beat (70% predicted).  Moderate to severe HF limitation with chronotropic incompetence.   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  NYHA class / AHA Stage: NYHA III Volume status & Diuretics: lasix  20mg  as needed. Volume looks good today. BMET/BNP today Vasodilators: continue losartan  12.5mg  daily; limited by SBP.  Beta-Blocker: continue metoprolol  12.5mg  daily & digoxin  125mcg; becomes very symptomatic with increases. MRA:spironolactone  25 mg daily Cardiometabolic: developed urinary symptoms twice with Farixga, will not retrial Devices therapies & Valvulopathies: Has seen EP, hesitant to proceed with primary prevention ICD. Not keen on the idea of permanent device Advanced therapies: CPX with moderate to severe HF limitation with VE/VCO2 slope of 31, Peak VO2 of 17.6 (54% of predicted).  RHC 03/25 with low filling pressures and preserved CI  2.  Moderate mitral regurgitation -Previously severe, improving with GDMT.  -  Cardiac MRI in July 2024 with moderate MR (regurgitant fraction 24%) -  Echo 02/25: EF 30-35%, moderate MR  3. Peripartum cardiomyopathy  - Has RX for SLYND  but has not  started, followed by Dr. Erik - We discussed the importance of contraception and avoidance of future pregnancies today  4. High risk pregnancy  - Ms. Hopple believes she is now 4-[redacted] weeks pregnant. She presents today for discussion regarding her future risks if she were to pursue full term. We had a very lengthy discussion involving her husband. We reviewed the CARPREG II score which at a minimum indicates a 10% risk of cardiac event with likely risk rate of 40% based on her prior history. I will discuss her case with OBGYN and reach out to her again next week.   I spent 65 minutes caring for this patient today including face to face time, reviewing echocardiogram with her and her husband, discussing risks of pregnancy extensively, seeing the patient, documenting in the record, and arranging follow ups.

## 2023-12-06 NOTE — Patient Instructions (Signed)
  Follow-Up in: 1 month as scheduled   At the [facility], you and your health needs are our priority. We have a designated team specialized in the treatment of Heart Failure. This Care Team includes your primary Heart Failure Specialized Cardiologist (physician), Advanced Practice Providers (APPs- Physician Assistants and Nurse Practitioners), and Pharmacist who all work together to provide you with the care you need, when you need it.   You may see any of the following providers on your designated Care Team at your next follow up:  Dr. Jules Oar Dr. Peder Bourdon Dr. Alwin Baars Dr. Judyth Nunnery Nieves Bars, NP Ruddy Corral, [place] [facility] [place], [place] Dennise Fitz, NP [place] Lee, NP Luster Salters, PharmD   Please be sure to bring in all your medications bottles to every appointment.   Need to Contact Us :  If you have any questions or concerns before your next appointment please send us  a message through [place] or call our office at [phone].    TO LEAVE A MESSAGE FOR THE NURSE SELECT OPTION 2, PLEASE LEAVE A MESSAGE INCLUDING: YOUR NAME DATE OF BIRTH CALL BACK NUMBER REASON FOR CALL**this is important as we prioritize the call backs  YOU WILL RECEIVE A CALL BACK THE SAME DAY AS LONG AS YOU CALL BEFORE 4:00 PM

## 2023-12-10 NOTE — Telephone Encounter (Signed)
 Patient called again to request details regarding OBGYN details.

## 2023-12-11 ENCOUNTER — Other Ambulatory Visit (HOSPITAL_COMMUNITY): Payer: Self-pay | Admitting: Cardiology

## 2023-12-11 DIAGNOSIS — O903 Peripartum cardiomyopathy: Secondary | ICD-10-CM

## 2023-12-11 NOTE — Progress Notes (Signed)
 Stat Pelvic U/S

## 2023-12-12 ENCOUNTER — Other Ambulatory Visit: Payer: Self-pay | Admitting: Obstetrics & Gynecology

## 2023-12-12 DIAGNOSIS — O3680X0 Pregnancy with inconclusive fetal viability, not applicable or unspecified: Secondary | ICD-10-CM

## 2023-12-13 ENCOUNTER — Ambulatory Visit

## 2023-12-13 DIAGNOSIS — O3680X0 Pregnancy with inconclusive fetal viability, not applicable or unspecified: Secondary | ICD-10-CM

## 2023-12-13 DIAGNOSIS — O3481 Maternal care for other abnormalities of pelvic organs, first trimester: Secondary | ICD-10-CM | POA: Diagnosis not present

## 2023-12-13 DIAGNOSIS — Z3A01 Less than 8 weeks gestation of pregnancy: Secondary | ICD-10-CM | POA: Diagnosis not present

## 2023-12-13 DIAGNOSIS — O09511 Supervision of elderly primigravida, first trimester: Secondary | ICD-10-CM | POA: Diagnosis not present

## 2023-12-16 NOTE — Telephone Encounter (Signed)
 Patient called again to discuss what's to do next and US  results

## 2023-12-18 ENCOUNTER — Encounter (HOSPITAL_COMMUNITY): Payer: Self-pay | Admitting: Cardiology

## 2023-12-19 ENCOUNTER — Telehealth: Payer: Self-pay | Admitting: *Deleted

## 2023-12-19 ENCOUNTER — Telehealth: Payer: Self-pay

## 2023-12-19 DIAGNOSIS — O09211 Supervision of pregnancy with history of pre-term labor, first trimester: Secondary | ICD-10-CM

## 2023-12-19 NOTE — Progress Notes (Signed)
 Complex Care Management Note  Care Guide Note 12/19/2023 Name: [name] MRN: [id number] DOB: 1985/10/25  [name] is a 38 y.o. year old female who sees Sebastian Beverley NOVAK, MD for primary care. I reached out to Donny Huxley by phone today to offer complex care management services.  Ms. Wake was given information about Complex Care Management services today including:   The Complex Care Management services include support from the care team which includes your Nurse Care Manager, Clinical Social Worker, or Pharmacist.  The Complex Care Management team is here to help remove barriers to the health concerns and goals most important to you. Complex Care Management services are voluntary, and the patient may decline or stop services at any time by request to their care team member.   Complex Care Management Consent Status: Patient did not agree to participate in complex care management services at this time.  Follow up plan:  Unsuccessful telephone outreach attempt made. A HIPAA compliant phone message was left for the patient providing contact information and requesting a return call.  Encounter Outcome:  Patient Refused   Leotis Rase [facility], [facility] Guide  Direct Dial: [phone]  Fax [phone]

## 2023-12-19 NOTE — Telephone Encounter (Signed)
 Left patient a message to call and schedule consult as soon as possible per Dr. Cris.

## 2023-12-23 ENCOUNTER — Encounter: Payer: Self-pay | Admitting: Obstetrics & Gynecology

## 2023-12-23 ENCOUNTER — Ambulatory Visit: Admitting: Obstetrics & Gynecology

## 2023-12-23 ENCOUNTER — Other Ambulatory Visit: Payer: Self-pay | Admitting: Obstetrics & Gynecology

## 2023-12-23 VITALS — BP 108/64 | HR 81 | Ht 64.0 in | Wt 125.0 lb

## 2023-12-23 DIAGNOSIS — I428 Other cardiomyopathies: Secondary | ICD-10-CM

## 2023-12-23 DIAGNOSIS — O0991 Supervision of high risk pregnancy, unspecified, first trimester: Secondary | ICD-10-CM | POA: Diagnosis not present

## 2023-12-23 DIAGNOSIS — I501 Left ventricular failure: Secondary | ICD-10-CM | POA: Diagnosis not present

## 2023-12-23 DIAGNOSIS — I5020 Unspecified systolic (congestive) heart failure: Secondary | ICD-10-CM

## 2023-12-23 DIAGNOSIS — Z3490 Encounter for supervision of normal pregnancy, unspecified, unspecified trimester: Secondary | ICD-10-CM | POA: Diagnosis not present

## 2023-12-23 DIAGNOSIS — Z3A01 Less than 8 weeks gestation of pregnancy: Secondary | ICD-10-CM | POA: Diagnosis not present

## 2023-12-23 DIAGNOSIS — O99411 Diseases of the circulatory system complicating pregnancy, first trimester: Secondary | ICD-10-CM | POA: Diagnosis not present

## 2023-12-23 NOTE — Progress Notes (Signed)
   Subjective:    Patient ID: [name], female    DOB: [birth year], 38 y.o.   MRN: [id number]  HPI  38 [id number] at approx [redacted] weeks pregnant (US  12/16/23 shows 6 week 2 day IUP.  Pt and husband had lengthy discussion with Dr. Miki and have decided to have  a termination of pregnancy due to her CHF.    Discussed birth contorl--IUD Review of Systems  Constitutional: Negative.   Respiratory: Negative.    Cardiovascular: Negative.   Gastrointestinal: Negative.   Genitourinary: Negative.        Objective:   Physical Exam Vitals reviewed.  Constitutional:      General: She is not in acute distress.    Appearance: She is well-developed.  HENT:     Head: Normocephalic and atraumatic.  Eyes:     Conjunctiva/sclera: Conjunctivae normal.  Cardiovascular:     Rate and Rhythm: Normal rate.  Pulmonary:     Effort: Pulmonary effort is normal.  Skin:    General: Skin is warm and dry.  Neurological:     Mental Status: She is alert and oriented to person, place, and time.  Psychiatric:        Mood and Affect: Mood normal.    Vitals:   12/23/23 1311  BP: 108/64  Pulse: 81  Weight: 125 lb (56.7 kg)  Height: 5' 4 (1.626 m)       Assessment & Plan:   38 yo female with early pregnancy, approximately 7 weeks 2 days with CHF and LV EF 30-35% Pt and husband have considered the counseling of her cardiologist and have decided of termination of pregnancy.   [place] forms and surgical consent forms filled out and filed appropriately. Pt will have procedure after 72 hours--either 7/10 or 7/11 by Dr. Herchel.    55 minutes were spent during this encounter including review of records, conversations and communication with Dr Genelle and Dr. Herchel, history and physical, counseling, documentation, and coordination of carr.

## 2023-12-24 ENCOUNTER — Encounter (HOSPITAL_COMMUNITY): Payer: Self-pay | Admitting: Obstetrics & Gynecology

## 2023-12-24 ENCOUNTER — Telehealth: Payer: Self-pay | Admitting: *Deleted

## 2023-12-24 ENCOUNTER — Telehealth: Payer: Self-pay

## 2023-12-24 ENCOUNTER — Other Ambulatory Visit (HOSPITAL_COMMUNITY): Payer: Self-pay | Admitting: Obstetrics & Gynecology

## 2023-12-24 DIAGNOSIS — Z332 Encounter for elective termination of pregnancy: Secondary | ICD-10-CM

## 2023-12-24 NOTE — Telephone Encounter (Signed)
 I called patient to provide surgery details for 12/27/23. I left a voicemail advising patient is scheduled for surgery on 12/27/23 and will need to arrive at [facility] at 11 am. I requested patient call me back at [phone].

## 2023-12-24 NOTE — Telephone Encounter (Signed)
 Returned call from 3:52 PM. Left patient a message to call the office to speak with a nurse.

## 2023-12-25 ENCOUNTER — Encounter (HOSPITAL_COMMUNITY): Payer: Self-pay | Admitting: *Deleted

## 2023-12-25 NOTE — Progress Notes (Signed)
 Spoke w/ via phone for pre-op interview--- pt Lab needs dos---- cbc/ cmp        Lab results------ current EKG in epic/ chart dated 11-08-2023 COVID test -----patient states asymptomatic no test needed Arrive at ------- 1100 on 12-27-2023 NPO after MN NO Solid Food.  Clear liquids from MN until--- 0900 (water / gatorade) Pre-Surgery Ensure or G2: n/a  Med rec completed Medications to take morning of surgery ----- none Diabetic medication ----- n/a  GLP1 agonist last dose: n/a GLP1 instructions:  Patient instructed no nail polish to be worn day of surgery Patient instructed to bring photo id and insurance card day of surgery Patient aware to have Driver (ride ) / caregiver    for 24 hours after surgery - husband, [name] Patient Special Instructions ----- soap shower morning of surgery Pre-Op special Instructions ----- n/a  Patient verbalized understanding of instructions that were given at this phone interview. Patient denies chest pain, sob, fever, cough at the interview.    Anesthesia Review:   Heart Failure w/ reduced EF 30-35%;  Nonischemic cardiomyopathy; moderate MR;  Chronic fatigue;  Dyspnea w/ moderate exertion;  Intermittent palpitations Pt denies any peripheral swelling ,  stable since last visit w/ cardiology 12-06-2023.  Pt is current 6-[redacted] weeks pregnant due to high risk decided for therapeutic abortion.   PCP: Dr A. Sebastian Cardiologist :  CHF clinic (lov w/ Dr A. Sabharwal 12-06-2023,  in note stated pt has s/p ICD ,  patient DOES NOT have ICD)  PPM/ ICD:  NO Device Orders: Rep Notified:  Chest x-ray : 11-08-2023 EKG : 11-08-2023 Echo : 08-13-2023 Stress test: no CMR:  12-19-2022 CPX:  02-20-2023 Cardiac Cath :  08-26-2023   Activity level:  sob w/ moderate activity Sleep Study/ CPAP : yes ,  08-06-2023  normal study Fasting Blood Sugar :      / Checks Blood Sugar -- times a day:  no  Blood Thinner/ Instructions /Last Dose:  no ASA /  Instructions/ Last Dose :  no

## 2023-12-26 ENCOUNTER — Encounter (HOSPITAL_COMMUNITY): Payer: Self-pay | Admitting: Cardiology

## 2023-12-26 MED ORDER — DOXYCYCLINE HYCLATE 100 MG IV SOLR
200.0000 mg | INTRAVENOUS | Status: AC
  Administered 2023-12-27: 200 mg via INTRAVENOUS
  Filled 2023-12-26 (×3): qty 200

## 2023-12-27 ENCOUNTER — Encounter (HOSPITAL_COMMUNITY): Payer: Self-pay | Admitting: Obstetrics & Gynecology

## 2023-12-27 ENCOUNTER — Ambulatory Visit (HOSPITAL_COMMUNITY)
Admission: RE | Admit: 2023-12-27 | Discharge: 2023-12-27 | Disposition: A | Discharge status: Home / Self Care | Attending: Obstetrics & Gynecology | Admitting: Obstetrics & Gynecology

## 2023-12-27 ENCOUNTER — Ambulatory Visit (HOSPITAL_COMMUNITY)
Admission: RE | Admit: 2023-12-27 | Discharge: 2023-12-27 | Disposition: A | Discharge status: Home / Self Care | Source: Ambulatory Visit | Attending: Obstetrics & Gynecology | Admitting: Obstetrics & Gynecology

## 2023-12-27 ENCOUNTER — Encounter (HOSPITAL_COMMUNITY)
Admission: RE | Disposition: A | Discharge status: Home / Self Care | Payer: Self-pay | Source: Home / Self Care | Attending: Obstetrics & Gynecology

## 2023-12-27 ENCOUNTER — Ambulatory Visit (HOSPITAL_BASED_OUTPATIENT_CLINIC_OR_DEPARTMENT_OTHER): Payer: Self-pay | Admitting: Certified Registered Nurse Anesthetist

## 2023-12-27 ENCOUNTER — Ambulatory Visit (HOSPITAL_COMMUNITY): Payer: Self-pay | Admitting: Certified Registered Nurse Anesthetist

## 2023-12-27 ENCOUNTER — Encounter (HOSPITAL_COMMUNITY): Admitting: Cardiology

## 2023-12-27 ENCOUNTER — Other Ambulatory Visit: Payer: Self-pay

## 2023-12-27 DIAGNOSIS — Z3A09 9 weeks gestation of pregnancy: Secondary | ICD-10-CM

## 2023-12-27 DIAGNOSIS — Z332 Encounter for elective termination of pregnancy: Secondary | ICD-10-CM | POA: Diagnosis not present

## 2023-12-27 DIAGNOSIS — I5020 Unspecified systolic (congestive) heart failure: Secondary | ICD-10-CM | POA: Diagnosis present

## 2023-12-27 DIAGNOSIS — I428 Other cardiomyopathies: Secondary | ICD-10-CM | POA: Insufficient documentation

## 2023-12-27 DIAGNOSIS — I340 Nonrheumatic mitral (valve) insufficiency: Secondary | ICD-10-CM | POA: Diagnosis present

## 2023-12-27 DIAGNOSIS — I519 Heart disease, unspecified: Secondary | ICD-10-CM | POA: Diagnosis not present

## 2023-12-27 DIAGNOSIS — J984 Other disorders of lung: Secondary | ICD-10-CM | POA: Diagnosis not present

## 2023-12-27 DIAGNOSIS — O0489 (Induced) termination of pregnancy with other complications: Secondary | ICD-10-CM | POA: Diagnosis not present

## 2023-12-27 DIAGNOSIS — Z01818 Encounter for other preprocedural examination: Secondary | ICD-10-CM

## 2023-12-27 DIAGNOSIS — I5022 Chronic systolic (congestive) heart failure: Secondary | ICD-10-CM

## 2023-12-27 HISTORY — DX: Other cardiomyopathies: I42.8

## 2023-12-27 HISTORY — DX: Nonrheumatic mitral (valve) insufficiency: I34.0

## 2023-12-27 HISTORY — DX: Chronic fatigue, unspecified: R53.82

## 2023-12-27 HISTORY — PX: DILATION AND EVACUATION: SHX1459

## 2023-12-27 HISTORY — PX: OPERATIVE ULTRASOUND: SHX5996

## 2023-12-27 HISTORY — DX: Palpitations: R00.2

## 2023-12-27 HISTORY — DX: Other forms of dyspnea: R06.09

## 2023-12-27 HISTORY — DX: Supervision of high risk pregnancy, unspecified, first trimester: O09.91

## 2023-12-27 LAB — CBC
HCT: 40.3 % (ref 36.0–46.0)
Hemoglobin: 13.3 g/dL (ref 12.0–15.0)
MCH: 26 pg (ref 26.0–34.0)
MCHC: 33 g/dL (ref 30.0–36.0)
MCV: 78.7 fL — ABNORMAL LOW (ref 80.0–100.0)
Platelets: 450 K/uL — ABNORMAL HIGH (ref 150–400)
RBC: 5.12 MIL/uL — ABNORMAL HIGH (ref 3.87–5.11)
RDW: 14.5 % (ref 11.5–15.5)
WBC: 9.4 K/uL (ref 4.0–10.5)
nRBC: 0 % (ref 0.0–0.2)

## 2023-12-27 LAB — COMPREHENSIVE METABOLIC PANEL WITH GFR
ALT: 34 U/L (ref 0–44)
AST: 28 U/L (ref 15–41)
Albumin: 4.5 g/dL (ref 3.5–5.0)
Alkaline Phosphatase: 29 U/L — ABNORMAL LOW (ref 38–126)
Anion gap: 10 (ref 5–15)
BUN: 8 mg/dL (ref 6–20)
CO2: 20 mmol/L — ABNORMAL LOW (ref 22–32)
Calcium: 9.2 mg/dL (ref 8.9–10.3)
Chloride: 106 mmol/L (ref 98–111)
Creatinine, Ser: 0.51 mg/dL (ref 0.44–1.00)
GFR, Estimated: >60 mL/min (ref >60)
Glucose, Bld: 88 mg/dL (ref 70–99)
Potassium: 3.8 mmol/L (ref 3.5–5.1)
Sodium: 136 mmol/L (ref 135–145)
Total Bilirubin: 1.1 mg/dL (ref 0.0–1.2)
Total Protein: 8.4 g/dL — ABNORMAL HIGH (ref 6.5–8.1)

## 2023-12-27 LAB — TYPE AND SCREEN
ABO/RH(D): B POS
Antibody Screen: NEGATIVE

## 2023-12-27 SURGERY — DILATION AND EVACUATION, UTERUS
Anesthesia: General

## 2023-12-27 MED ORDER — BUPIVACAINE HCL 0.5 % IJ SOLN
INTRAMUSCULAR | Status: DC | PRN
  Administered 2023-12-27: 25 mL

## 2023-12-27 MED ORDER — ONDANSETRON HCL 4 MG/2ML IJ SOLN
INTRAMUSCULAR | Status: DC | PRN
  Administered 2023-12-27: 4 mg via INTRAVENOUS

## 2023-12-27 MED ORDER — ETOMIDATE 2 MG/ML IV SOLN
INTRAVENOUS | Status: AC
Start: 2023-12-27 — End: 2023-12-27
  Filled 2023-12-27: qty 10

## 2023-12-27 MED ORDER — OXYCODONE HCL 5 MG PO TABS
ORAL_TABLET | ORAL | Status: AC
  Filled 2023-12-27: qty 1

## 2023-12-27 MED ORDER — DROPERIDOL 2.5 MG/ML IJ SOLN: 0.6250 mg | Freq: Once | INTRAMUSCULAR | Status: DC | PRN

## 2023-12-27 MED ORDER — ORAL CARE MOUTH RINSE: 15.0000 mL | Freq: Once | OROMUCOSAL | Status: AC

## 2023-12-27 MED ORDER — LIDOCAINE 2% (20 MG/ML) 5 ML SYRINGE
INTRAMUSCULAR | Status: AC
  Filled 2023-12-27: qty 5

## 2023-12-27 MED ORDER — CHLORHEXIDINE GLUCONATE 0.12 % MT SOLN
OROMUCOSAL | Status: DC
Start: 2023-12-27 — End: 2023-12-27
  Filled 2023-12-27: qty 15

## 2023-12-27 MED ORDER — IBUPROFEN 600 MG PO TABS: 600.0000 mg | ORAL_TABLET | Freq: Four times a day (QID) | ORAL | 2 refills | Status: DC | PRN

## 2023-12-27 MED ORDER — ACETAMINOPHEN 500 MG PO TABS: 1000.0000 mg | ORAL_TABLET | Freq: Four times a day (QID) | ORAL | 2 refills | Status: DC | PRN

## 2023-12-27 MED ORDER — OXYCODONE HCL 5 MG PO TABS
5.0000 mg | ORAL_TABLET | Freq: Once | ORAL | Status: AC | PRN
  Administered 2023-12-27: 5 mg via ORAL

## 2023-12-27 MED ORDER — ACETAMINOPHEN 500 MG PO TABS
1000.0000 mg | ORAL_TABLET | ORAL | Status: AC
  Administered 2023-12-27: 1000 mg via ORAL

## 2023-12-27 MED ORDER — DEXAMETHASONE SODIUM PHOSPHATE 10 MG/ML IJ SOLN
INTRAMUSCULAR | Status: DC | PRN
  Administered 2023-12-27: 10 mg via INTRAVENOUS

## 2023-12-27 MED ORDER — SODIUM CHLORIDE 0.9 % IR SOLN
Status: DC | PRN
  Administered 2023-12-27: 1000 mL

## 2023-12-27 MED ORDER — DOCUSATE SODIUM 100 MG PO CAPS: 100.0000 mg | ORAL_CAPSULE | Freq: Two times a day (BID) | ORAL | 2 refills | Status: DC | PRN

## 2023-12-27 MED ORDER — ONDANSETRON HCL 4 MG/2ML IJ SOLN
INTRAMUSCULAR | Status: AC
  Filled 2023-12-27: qty 2

## 2023-12-27 MED ORDER — MIDAZOLAM HCL 2 MG/2ML IJ SOLN
INTRAMUSCULAR | Status: AC
  Filled 2023-12-27: qty 2

## 2023-12-27 MED ORDER — PROPOFOL 10 MG/ML IV BOLUS
INTRAVENOUS | Status: AC
  Filled 2023-12-27: qty 20

## 2023-12-27 MED ORDER — DEXAMETHASONE SODIUM PHOSPHATE 10 MG/ML IJ SOLN
INTRAMUSCULAR | Status: AC
  Filled 2023-12-27: qty 1

## 2023-12-27 MED ORDER — OXYCODONE HCL 5 MG PO TABS: 5.0000 mg | ORAL_TABLET | ORAL | 0 refills | Status: DC | PRN

## 2023-12-27 MED ORDER — SCOPOLAMINE 1 MG/3DAYS TD PT72
MEDICATED_PATCH | TRANSDERMAL | Status: AC
  Filled 2023-12-27: qty 1

## 2023-12-27 MED ORDER — PROPOFOL 10 MG/ML IV BOLUS
INTRAVENOUS | Status: DC | PRN
  Administered 2023-12-27: 80 mg via INTRAVENOUS
  Administered 2023-12-27: 10 mg via INTRAVENOUS

## 2023-12-27 MED ORDER — ACETAMINOPHEN 500 MG PO TABS
ORAL_TABLET | ORAL | Status: AC
  Filled 2023-12-27: qty 2

## 2023-12-27 MED ORDER — FENTANYL CITRATE (PF) 250 MCG/5ML IJ SOLN
INTRAMUSCULAR | Status: AC
  Filled 2023-12-27: qty 5

## 2023-12-27 MED ORDER — LACTATED RINGERS IV SOLN: INTRAVENOUS | Status: DC

## 2023-12-27 MED ORDER — LIDOCAINE 2% (20 MG/ML) 5 ML SYRINGE
INTRAMUSCULAR | Status: DC | PRN
  Administered 2023-12-27: 60 mg via INTRAVENOUS

## 2023-12-27 MED ORDER — FENTANYL CITRATE (PF) 250 MCG/5ML IJ SOLN
INTRAMUSCULAR | Status: DC | PRN
  Administered 2023-12-27: 50 ug via INTRAVENOUS

## 2023-12-27 MED ORDER — FENTANYL CITRATE (PF) 100 MCG/2ML IJ SOLN: 25.0000 ug | INTRAMUSCULAR | Status: DC | PRN

## 2023-12-27 MED ORDER — PHENYLEPHRINE HCL-NACL 20-0.9 MG/250ML-% IV SOLN
INTRAVENOUS | Status: DC | PRN
  Administered 2023-12-27: 30 ug/min via INTRAVENOUS

## 2023-12-27 MED ORDER — MIDAZOLAM HCL 2 MG/2ML IJ SOLN
INTRAMUSCULAR | Status: DC | PRN
  Administered 2023-12-27: 2 mg via INTRAVENOUS

## 2023-12-27 MED ORDER — PARAGARD INTRAUTERINE COPPER IU IUD
1.0000 | INTRAUTERINE_SYSTEM | INTRAUTERINE | Status: DC
  Filled 2023-12-27: qty 1

## 2023-12-27 MED ORDER — CHLORHEXIDINE GLUCONATE 0.12 % MT SOLN
15.0000 mL | Freq: Once | OROMUCOSAL | Status: AC
  Administered 2023-12-27: 15 mL via OROMUCOSAL

## 2023-12-27 MED ORDER — OXYCODONE HCL 5 MG/5ML PO SOLN: 5.0000 mg | Freq: Once | ORAL | Status: AC | PRN

## 2023-12-27 SURGICAL SUPPLY — 21 items
CATH ROBINSON RED A/P 16FR (CATHETERS) ×2 IMPLANT
COVER MAYO STAND STRL (DRAPES) ×2 IMPLANT
FILTER UTR ASPR ASSEMBLY (MISCELLANEOUS) ×2 IMPLANT
GLOVE BIOGEL PI IND STRL 7.0 (GLOVE) ×2 IMPLANT
GLOVE ECLIPSE 7.0 STRL STRAW (GLOVE) ×2 IMPLANT
GOWN STRL REUS W/ TWL LRG LVL3 (GOWN DISPOSABLE) ×4 IMPLANT
HOSE CONNECTING 18IN BERKELEY (TUBING) ×2 IMPLANT
KIT BERKELEY 1ST TRI 3/8 NO TR (MISCELLANEOUS) ×2 IMPLANT
KIT BERKELEY 1ST TRIMESTER 3/8 (MISCELLANEOUS) ×2 IMPLANT
NS IRRIG 1000ML POUR BTL (IV SOLUTION) ×2 IMPLANT
PACK VAGINAL MINOR WOMEN LF (CUSTOM PROCEDURE TRAY) ×2 IMPLANT
PAD OB MATERNITY 11 LF (PERSONAL CARE ITEMS) ×2 IMPLANT
SET BERKELEY SUCTION TUBING (SUCTIONS) ×2 IMPLANT
SPIKE FLUID TRANSFER (MISCELLANEOUS) ×2 IMPLANT
TOWEL GREEN STERILE FF (TOWEL DISPOSABLE) ×4 IMPLANT
UNDERPAD 30X36 HEAVY ABSORB (UNDERPADS AND DIAPERS) ×2 IMPLANT
VACURETTE 10 RIGID CVD (CANNULA) IMPLANT
VACURETTE 6 ASPIR F TIP BERK (CANNULA) IMPLANT
VACURETTE 7MM CVD STRL WRAP (CANNULA) IMPLANT
VACURETTE 8 RIGID CVD (CANNULA) IMPLANT
VACURETTE 9 RIGID CVD (CANNULA) IMPLANT

## 2023-12-27 NOTE — Op Note (Addendum)
 Colleene Swarthout PROCEDURE DATE:  12/27/2023  PREOPERATIVE DIAGNOSES: Intrauterine fetal pregnancy at [redacted]w[redacted]d, active cardiac issues with high risk of complications during pregnancy POSTOPERATIVE DIAGNOSES: The same PROCEDURE:  Dilation and Evacuation under ultrasound guidance SURGEON:  Dr. Gloris Hugger  INDICATIONS: 38 y.o. [id number] with the aforementioned diagnoses, who desires surgical management.  Risks of surgery were discussed with the patient including but not limited to: bleeding which may require transfusion; infection which may require antibiotics; injury to uterus or surrounding organs; need for additional procedures including laparotomy or laparoscopy; possibility of intrauterine scarring which may impair future fertility; and other postoperative/anesthesia complications. Written informed consent was obtained.  FINDINGS:  Significant amount of products of conception.  Empty endometrial stripe noted on ultrasound at the end of the procedure.   ANESTHESIA:    General - LMA, paracervical block with 25 ml of 0.5% Marcaine  ESTIMATED BLOOD LOSS:  25 ml. SPECIMENS:  Products of conception sent to pathology COMPLICATIONS:  None immediate.  PROCEDURE DETAILS:  The patient received intravenous Doxycycline  while in the preoperative area.  She was then taken to the operating room where anesthesia  was administered and was found to be adequate.  After an adequate timeout was performed, she was placed in the dorsal lithotomy position and examined; then prepped and draped in the sterile manner.   Her bladder was catheterized for an unmeasured amount of clear, yellow urine. A vaginal speculum was then placed in the patient's vagina and a single tooth tenaculum was applied to the anterior lip of the cervix.  A paracervical block using 30 ml of 0.5% Marcaine  was administered. The cervix was gently dilated under ultrasound guidance to accommodate a 8 mm suction curette that was gently advanced to the uterine  fundus.  The suction device was then activated and curette slowly rotated to clear the uterus of products of conception. . There was an empty endometrial stripe noted on the ultrasound at the end of the curettage. There was minimal bleeding noted at the end of the procedure, and the tenaculum removed with good hemostasis noted.   All instruments were removed from the patient's vagina.  Sponge and instrument counts were correct times three.    The patient tolerated the procedure well and was taken to the recovery area awake, extubated and in stable condition.  The patient will be discharged to home as per PACU criteria.  Routine postoperative instructions given.  She was prescribed Oxycodone , Acetaminophen , Ibuprofen  and Colace.  She will follow up in the office in 2-3 weeks for postoperative evaluation.   GLORIS HUGGER, MD, FACOG Obstetrician & Gynecologist, [facility] for [employer], [facility]

## 2023-12-27 NOTE — Anesthesia Postprocedure Evaluation (Signed)
 Anesthesia Post Note  Patient: [name]  Procedure(s) Performed: DILATION AND EVACUATION, UTERUS US  INTRAOPERATIVE     Patient location during evaluation: PACU Anesthesia Type: General Level of consciousness: awake and alert Pain management: pain level controlled Vital Signs Assessment: post-procedure vital signs reviewed and stable Respiratory status: spontaneous breathing, nonlabored ventilation and respiratory function stable Cardiovascular status: blood pressure returned to baseline Postop Assessment: no apparent nausea or vomiting Anesthetic complications: no   No notable events documented.  Last Vitals:  Vitals:   12/27/23 1415 12/27/23 1426  BP: (!) 126/93   Pulse: 62 74  Resp: 13 20  Temp: 36.5 C   SpO2: 100% 100%    Last Pain:  Vitals:   12/27/23 1532  TempSrc:   PainSc: 5                  Vertell Row

## 2023-12-27 NOTE — Transfer of Care (Signed)
 Immediate Anesthesia Transfer of Care Note  Patient: [name]  Procedure(s) Performed: DILATION AND EVACUATION, UTERUS US  INTRAOPERATIVE  Patient Location: PACU  Anesthesia Type:General  Level of Consciousness: sedated  Airway & Oxygen Therapy: Patient Spontanous Breathing and Patient connected to face mask oxygen  Post-op Assessment: Report given to RN and Post -op Vital signs reviewed and stable  Post vital signs: Reviewed and stable  Last Vitals:  Vitals Value Taken Time  BP 126/93 12/27/23 14:17  Temp    Pulse 59 12/27/23 14:21  Resp 13 12/27/23 14:21  SpO2 100 % 12/27/23 14:21  Vitals shown include unfiled device data.  Last Pain:  Vitals:   12/27/23 1116  TempSrc: Oral  PainSc: 0-No pain      Patients Stated Pain Goal: 5 (12/27/23 1116)  Complications: No notable events documented.

## 2023-12-27 NOTE — Anesthesia Preprocedure Evaluation (Addendum)
 Anesthesia Evaluation  Patient identified by MRN, date of birth, ID band Patient awake    Reviewed: Allergy & Precautions, NPO status , Patient's Chart, lab work & pertinent test results  History of Anesthesia Complications Negative for: history of anesthetic complications  Airway Mallampati: II  TM Distance: >3 FB Neck ROM: Full    Dental  (+) Missing,    Pulmonary neg pulmonary ROS   Pulmonary exam normal        Cardiovascular Normal cardiovascular exam  TTE 08/09/23: EF 30-35%, global hypokinesis, moderate MR    Neuro/Psych negative neurological ROS  negative psych ROS   GI/Hepatic negative GI ROS, Neg liver ROS,,,  Endo/Other  negative endocrine ROS    Renal/GU negative Renal ROS  negative genitourinary   Musculoskeletal negative musculoskeletal ROS (+)    Abdominal   Peds  Hematology negative hematology ROS (+)   Anesthesia Other Findings Day of surgery medications reviewed with patient.  Reproductive/Obstetrics negative OB ROS                              Anesthesia Physical Anesthesia Plan  ASA: 3  Anesthesia Plan: General   Post-op Pain Management: Tylenol  PO (pre-op)*   Induction: Intravenous  PONV Risk Score and Plan: 3 and Treatment may vary due to age or medical condition, Ondansetron , Dexamethasone , Midazolam  and Scopolamine  patch - Pre-op  Airway Management Planned: LMA  Additional Equipment: None  Intra-op Plan:   Post-operative Plan: Extubation in OR  Informed Consent: I have reviewed the patients History and Physical, chart, labs and discussed the procedure including the risks, benefits and alternatives for the proposed anesthesia with the patient or authorized representative who has indicated his/her understanding and acceptance.     Dental advisory given  Plan Discussed with: CRNA  Anesthesia Plan Comments:          Anesthesia Quick  Evaluation

## 2023-12-27 NOTE — H&P (Addendum)
 Preoperative History and Physical  [name] is a 38 y.o. [phone] with intrauterine pregnancy at [redacted]w[redacted]d here for medically indicated termination of pregnancy. She has a history remarkable for:   Systolic heart failure (HCC)   Mitral valve regurgitation   Restrictive lung disease   Heart failure with reduced ejection fraction, NYHA class III (HCC) Due to these issues, and high risk of morbidity and mortality with pregnancy, she desires termination of pregnancy.  She has been cleared by Cardiology for this procedure.   No significant preoperative concerns.  Proposed surgery: Dilation and Evacuation under ultrasound guidance  Past Medical History:  Diagnosis Date   Chronic fatigue    Dyspnea on exertion    w/ moderate exertion due to heart failure   Heart failure with reduced ejection fraction, NYHA class III (HCC) 04/2022   followed by [facility] Failure Clinic; 11/ 2023  likey peripartum w/ nonischemic cardiomyopathy;   03/ 2024  severe pre-eclampsia active labor s/p c/s echo ef 30-35%;  admitted again 04/ 2024 sepsis for intrabdominal abscess , ef 30-35% ,  persisent ef 30-35% since   History of acute pyelonephritis 04/2022   admission in epic';  second trimester;  ef 40%   History of severe pre-eclampsia 08/2022   third pregnancy   History of severe sepsis 09/2022   post partum admission in epic due to intra-abdominal abscess   Intermittent palpitations    Moderate mitral valve regurgitation    Nonischemic cardiomyopathy (HCC)    w/ persistant ef 30-35% since 03/ 2024 pregancy complication w/ severe pre-ecplamsia   Pregnancy, high-risk, first trimester 11/2023   due to cardiac heart failure   Past Surgical History:  Procedure Laterality Date   CESAREAN SECTION N/A 02/02/2015   Procedure: CESAREAN SECTION;  Surgeon: Hargis Paradise, MD;  Location: [facility];  Service: Obstetrics;  Laterality: N/A;    AND BY DR L. BORDEN  BLADDER INJURY REPAIR   CESAREAN SECTION N/A 09/11/2022    Procedure: CESAREAN SECTION;  Surgeon: Jayne Vonn DEL, MD;  Location: [facility] ORS;  Service: Obstetrics;  Laterality: N/A;   CESAREAN SECTION  02/10/2008   @[facility]  by dr a. henry   DILATION AND EVACUATION  07/13/2010   @ [facility] by dr v. raeanne;  for blighted ovum   RIGHT HEART CATH N/A 08/26/2023   Procedure: RIGHT HEART CATH;  Surgeon: Gardenia Led, DO;  Location: [facility] IN[facility];  Service: Cardiovascular;  Laterality: N/A;   WISDOM TOOTH EXTRACTION     OB History  Gravida Para Term Preterm AB Living  6 3 1 2 2 3   SAB IAB Ectopic Multiple Live Births  1 1  0 3    # Outcome Date GA Lbr Len/2nd Weight Sex Type Anes PTL Lv  6 Current           5 Preterm 09/11/22 [redacted]w[redacted]d  2330 g F CS-LTranv Spinal  LIV     Complications: Hematoma, Preeclampsia  4 Preterm 02/02/15 [redacted]w[redacted]d  2520 g M CS-LVertical Spinal  LIV     Complications: Bladder injury  3 SAB 2012          2 Term 2009    F CS-LTranv   LIV  1 IAB 2003          Patient denies any other pertinent gynecologic issues.   No current facility-administered medications on file prior to encounter.   Current Outpatient Medications on File Prior to Encounter  Medication Sig Dispense Refill   digoxin  (LANOXIN )  0.125 MG tablet Take 1 tablet (0.125 mg total) by mouth daily. (Patient taking differently: Take 0.125 mg by mouth at bedtime.) 90 tablet 3   furosemide  (LASIX ) 20 MG tablet Take 1 tablet (20 mg total) by mouth daily as needed for fluid or edema. 30 tablet 11   losartan  (COZAAR ) 25 MG tablet Take 0.5 tablets (12.5 mg total) by mouth daily. (Patient taking differently: Take 12.5 mg by mouth at bedtime.) 45 tablet 3   meclizine  (MEDI-MECLIZINE ) 25 MG tablet Take 1/2 tablet to 1 tablet by mouth every 6 to 12 hours as needed (Patient taking differently: Take 12.5-25 mg by mouth as directed. Take 1/2 tablet to 1 tablet by mouth every 6 to 12 hours as needed) 30 tablet 0   metoprolol  succinate (TOPROL  XL) 25 MG 24 hr tablet Take 0.5 tablets (12.5 mg  total) by mouth at bedtime. (Patient taking differently: Take 12.5 mg by mouth at bedtime.) 15 tablet 6   spironolactone  (ALDACTONE ) 25 MG tablet Take 1 tablet (25 mg total) by mouth daily. (Patient taking differently: Take 25 mg by mouth at bedtime.) 90 tablet 3   Drospirenone  (SLYND ) 4 MG TABS Take 1 tablet (4 mg total) by mouth daily. (Patient not taking: Reported on 12/06/2023) 90 tablet 3   Allergies  Allergen Reactions   Penicillins Rash    Social History:   reports that she has never smoked. She has never used smokeless tobacco. She reports that she does not drink alcohol and does not use drugs.  Family History  Problem Relation Age of Onset   Hypertension Mother    Hypertension Father    Diabetes Father    Hypertension Paternal Aunt    Asthma Neg Hx    Cancer Neg Hx    Heart disease Neg Hx    Stroke Neg Hx     Review of Systems: Pertinent items noted in HPI and remainder of comprehensive ROS otherwise negative.  PHYSICAL EXAM: Blood pressure 121/81, pulse 68, temperature 98.6 F (37 C), temperature source Oral, resp. rate 16, height 5' 4 (1.626 m), weight 56.7 kg, last menstrual period 10/26/2023, SpO2 99%, not currently breastfeeding. CONSTITUTIONAL: Well-developed, well-nourished female in no acute distress.  HENT:  Normocephalic, atraumatic, External right and left ear normal. Oropharynx is clear and moist EYES: Conjunctivae and EOM are normal. Pupils are equal, round, and reactive to light. No scleral icterus.  NECK: Normal range of motion, supple, no masses SKIN: Skin is warm and dry. No rash noted. Not diaphoretic. No erythema. No pallor. NEUROLOGIC: Alert and oriented to person, place, and time. Normal reflexes, muscle tone coordination. No cranial nerve deficit noted. PSYCHIATRIC: Normal mood and affect. Normal behavior. Normal judgment and thought content. CARDIOVASCULAR: Normal heart rate noted, regular rhythm RESPIRATORY: Effort and breath sounds normal, no  problems with respiration noted ABDOMEN: Soft, nontender, nondistended. PELVIC: Deferred MUSCULOSKELETAL: Normal range of motion. No edema and no tenderness. 2+ distal pulses.  Labs: Results for orders placed or performed during the hospital encounter of 12/27/23 (from the past 2 weeks)  Type and screen [facility]   Collection Time: 12/27/23 10:54 AM  Result Value Ref Range   ABO/RH(D) PENDING    Antibody Screen PENDING    Sample Expiration      12/30/2023,2359 Performed at [facility], [address], [place], [place] [address]   CBC   Collection Time: 12/27/23 11:30 AM  Result Value Ref Range   WBC 9.4 4.0 - 10.5 K/uL  RBC 5.12 (H) 3.87 - 5.11 MIL/uL   Hemoglobin 13.3 12.0 - 15.0 g/dL   HCT 59.6 63.9 - 53.9 %   MCV 78.7 (L) 80.0 - 100.0 fL   MCH 26.0 26.0 - 34.0 pg   MCHC 33.0 30.0 - 36.0 g/dL   RDW 85.4 88.4 - 84.4 %   Platelets 450 (H) 150 - 400 K/uL   nRBC 0.0 0.0 - 0.2 %    Imaging Studies: US  OB LESS THAN 14 WEEKS WITH OB TRANSVAGINAL Result Date: 12/16/2023 CLINICAL DATA:  Initial evaluation for early pregnancy, unknown anatomic location. EXAM: OBSTETRIC <14 WK US  AND TRANSVAGINAL OB US  TECHNIQUE: Both transabdominal and transvaginal ultrasound examinations were performed for complete evaluation of the gestation as well as the maternal uterus, adnexal regions, and pelvic cul-de-sac. Transvaginal technique was performed to assess early pregnancy. COMPARISON:  None Available. FINDINGS: Intrauterine gestational sac: Single Yolk sac:  Present Embryo:  Present Cardiac Activity: Present Heart Rate: 111 bpm CRL:  4.9 mm   6 w   2 d                  US  EDC: 08/05/2024 Subchorionic hemorrhage:  None visualized. Maternal uterus/adnexae: Left ovary within normal limits. Degenerating corpus luteal cyst noted within the right ovary. No free fluid. Probable small intramural fibroid measuring 1.6 x 1.5 x 1.4 cm noted within the left uterine body. IMPRESSION: 1.  Single viable IUP, estimated gestational age [redacted] weeks and 2 days by crown-rump length, with ultrasound EDC of 08/05/2024. 2. 1.6 cm intramural fibroid within the left uterine body. 3. Degenerating right ovarian corpus luteal cyst. Electronically Signed   By: Morene Hoard M.D.   On: 12/16/2023 15:36    Assessment: Principal Problem:   Encounter for medically indicated termination of pregnancy Active Problems:   Systolic heart failure (HCC)   Mitral valve regurgitation   Restrictive lung disease   Heart failure with reduced ejection fraction, NYHA class III (HCC)   Plan: Patient will undergo surgical management with Dilation and Evacuation under ultrasound guidance.   The risks of surgery were discussed in detail with the patient including but not limited to: bleeding, infection, injury to surrounding organs, need for additional procedures, possibility of intrauterine scarring which may impair future fertility, risk of retained products which may require further management and other postoperative/anesthesia complications were explained to patient.   Likelihood of success of complete evacuation of the uterus was discussed with the patient.  Written informed consent was obtained.  Patient has been NPO since last night  she will remain NPO for procedure. Anesthesia and OR aware.  Preoperative prophylactic Doxycycline  200mg  IV  has been ordered and is on call to the OR.  To OR when ready.    GLORIS HUGGER, MD, FACOG Obstetrician & Gynecologist, [facility] for [employer], [facility]

## 2023-12-28 ENCOUNTER — Encounter (HOSPITAL_COMMUNITY): Payer: Self-pay | Admitting: Obstetrics & Gynecology

## 2023-12-30 ENCOUNTER — Encounter: Payer: Self-pay | Admitting: Obstetrics & Gynecology

## 2023-12-30 DIAGNOSIS — I428 Other cardiomyopathies: Secondary | ICD-10-CM | POA: Insufficient documentation

## 2023-12-30 LAB — SURGICAL PATHOLOGY

## 2023-12-31 ENCOUNTER — Ambulatory Visit: Payer: Self-pay | Admitting: Obstetrics & Gynecology

## 2024-01-20 ENCOUNTER — Ambulatory Visit (INDEPENDENT_AMBULATORY_CARE_PROVIDER_SITE_OTHER): Admitting: Obstetrics & Gynecology

## 2024-01-20 ENCOUNTER — Other Ambulatory Visit (HOSPITAL_COMMUNITY)
Admission: RE | Admit: 2024-01-20 | Discharge: 2024-01-20 | Disposition: A | Discharge status: Home / Self Care | Source: Ambulatory Visit | Attending: Obstetrics & Gynecology | Admitting: Obstetrics & Gynecology

## 2024-01-20 ENCOUNTER — Encounter: Payer: Self-pay | Admitting: Obstetrics & Gynecology

## 2024-01-20 VITALS — BP 117/80 | HR 76 | Ht 64.0 in | Wt 123.0 lb

## 2024-01-20 DIAGNOSIS — N870 Mild cervical dysplasia: Secondary | ICD-10-CM | POA: Diagnosis not present

## 2024-01-20 DIAGNOSIS — Z3009 Encounter for other general counseling and advice on contraception: Secondary | ICD-10-CM

## 2024-01-20 DIAGNOSIS — N898 Other specified noninflammatory disorders of vagina: Secondary | ICD-10-CM | POA: Insufficient documentation

## 2024-01-20 DIAGNOSIS — I5020 Unspecified systolic (congestive) heart failure: Secondary | ICD-10-CM | POA: Diagnosis not present

## 2024-01-20 MED ORDER — SLYND 4 MG PO TABS: 1.0000 | ORAL_TABLET | Freq: Every day | ORAL | 3 refills | Status: DC

## 2024-01-20 NOTE — Progress Notes (Unsigned)
   Subjective:    Patient ID: Shelby Peltz, female    DOB: [birth year], 38 y.o.   MRN: [id number]  HPI  38 yo [id number] s/p TOP secondary to heart failure.  Bleedign stopped July 29th.  Pt has been sexually active with condoms.  She did not restart Slynd .  She is still considering IUD and BTL,  The idea of surgery scares her.  She agrees to restart Slynd  and use condoms. She is experiencing white discharge and would like to be tested.    Review of Systems  Constitutional: Negative.   Respiratory: Negative.    Cardiovascular: Negative.   Gastrointestinal: Negative.   Genitourinary: Negative.        Objective:   Physical Exam Vitals reviewed.  Constitutional:      General: She is not in acute distress.    Appearance: She is well-developed.  HENT:     Head: Normocephalic and atraumatic.  Eyes:     Conjunctiva/sclera: Conjunctivae normal.  Cardiovascular:     Rate and Rhythm: Normal rate.  Pulmonary:     Effort: Pulmonary effort is normal.  Skin:    General: Skin is warm and dry.  Neurological:     Mental Status: She is alert and oriented to person, place, and time.  Psychiatric:        Mood and Affect: Mood normal.    Vitals:   01/20/24 1055  BP: 117/80  Pulse: 76  Weight: 123 lb (55.8 kg)  Height: 5' 4 (1.626 m)        Assessment & Plan:  38 yo female s/p TOP for heart failure   Vaginal discharge--check full panel Plan B Contraception--restart slynd , use condoms and she agrees to return in one month for Paragard  insertion.

## 2024-01-21 LAB — CERVICOVAGINAL ANCILLARY ONLY
Bacterial Vaginitis (gardnerella): NEGATIVE
Candida Glabrata: NEGATIVE
Candida Vaginitis: NEGATIVE
Chlamydia: NEGATIVE
Comment: NEGATIVE
Comment: NEGATIVE
Comment: NEGATIVE
Comment: NEGATIVE
Comment: NEGATIVE
Comment: NORMAL
Neisseria Gonorrhea: NEGATIVE
Trichomonas: NEGATIVE

## 2024-01-22 ENCOUNTER — Ambulatory Visit: Payer: Self-pay | Admitting: Obstetrics & Gynecology

## 2024-01-30 ENCOUNTER — Encounter (HOSPITAL_COMMUNITY): Payer: Self-pay | Admitting: Cardiology

## 2024-02-20 NOTE — Progress Notes (Signed)
 [facility] NOTE  Primary Care: Sebastian Beverley NOVAK, MD HF: Ria Commander, DO EP: Dr. 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   Her cardiac history dates back to November 2023 when she was admitted with pyelonephritis.  At that time she was noted to be tachycardic with inferolateral T wave inversions on ECG.  She had a follow-up echocardiogram demonstrating EF of 40%, apical lateral hypokinesis and moderate to severe mitral regurgitation.    Repeat echocardiogram in 2/24 with a EF of 45 to 50% and moderate MR.  In March 2024 she was admitted with severe preeclampsia and active labor.  She underwent C-section at that time with echocardiogram demonstrating EF of 30 to 35%.  Unfortunately once more readmitted in April 2024 for sepsis secondary to intra-abdominal abscess requiring IV antibiotics and IR drainage.    Since starting GDMT, repeat echos / CMR have demonstrated persistently reduced LVEF of 30-35%. She is now S/P ICD.   [name] called our clinic last week to let us  know she is once again pregnant. She wished to be seen today to discuss the risks of pregnancy in the setting of Stage C systolic heart failure. We had a very lengthy discussion regarding her pregnancy and heart failure. I reviewed the CARPREG II score with her. From a symptomatic standpoint she has been fairly stable with dyspnea after moderate exertion. She has stopped working now due to fatigue/dyspnea after activity. Euvolemic on exam.    Current Outpatient Medications  Medication Sig Dispense Refill   acetaminophen  (TYLENOL ) 500 MG tablet Take 2 tablets (1,000 mg total) by mouth every 6 (six) hours as needed for moderate pain (pain score 4-6) or mild pain (pain score 1-3). (Patient  not taking: Reported on 01/20/2024) 100 tablet 2   digoxin  (LANOXIN ) 0.125 MG tablet Take 1 tablet (0.125 mg total) by mouth daily. 90 tablet 3   docusate sodium  (COLACE) 100 MG capsule Take 1 capsule (100 mg total) by mouth 2 (two) times daily as needed for mild constipation or moderate constipation. 30 capsule 2   Drospirenone  (SLYND ) 4 MG TABS Take 1 tablet (4 mg total) by mouth daily. 90 tablet 3   furosemide  (LASIX ) 20 MG tablet Take 1 tablet (20 mg total) by mouth daily as needed for fluid or edema. 30 tablet 11   ibuprofen  (ADVIL ) 600 MG tablet Take 1 tablet (600 mg total) by mouth every 6 (six) hours as needed for headache, mild pain (pain score 1-3), moderate pain (pain score 4-6) or cramping. (Patient not taking: Reported on 01/20/2024) 30 tablet 2   losartan  (COZAAR ) 25 MG tablet Take 0.5 tablets (12.5 mg total) by mouth daily. 45 tablet 3   meclizine  (MEDI-MECLIZINE ) 25 MG tablet Take 1/2 tablet to 1 tablet by mouth every 6 to 12 hours as needed (Patient not taking: Reported on 01/20/2024) 30 tablet 0   metoprolol  succinate (TOPROL  XL) 25 MG 24 hr tablet Take 0.5 tablets (12.5 mg total) by mouth at bedtime. 15 tablet 6   oxyCODONE  (OXY IR/ROXICODONE ) 5 MG immediate release tablet Take 1 tablet (5 mg total) by mouth every 4 (four) hours as needed for severe pain (pain score 7-10) or breakthrough pain. (Patient not taking: Reported on 01/20/2024) 15 tablet 0   spironolactone  (ALDACTONE ) 25 MG tablet Take 1 tablet (25  mg total) by mouth daily. 90 tablet 3   No current facility-administered medications for this visit.    PHYSICAL EXAM: There were no vitals filed for this visit. GENERAL: NAD Lungs- *** CARDIAC:  JVP: *** cm          Normal rate with regular rhythm. *** murmur.  Pulses ***. *** edema.  ABDOMEN: Soft, non-tender, non-distended.  EXTREMITIES: Warm and well perfused.  NEUROLOGIC: No obvious FND    DATA REVIEW  RHC 03/25: HEMODYNAMICS: RA:                  2 mmHg (mean) RV:                   23/2 mmHg PA:                  20/5 mmHg (11 mean) PCWP:            5 mmHg (mean)                                      Estimated Fick CO/CI   4.65 L/min, 2.9 L/min/m2 Thermodilution CO/CI   4.2 L/min, 2.6 L/min/m2                                              TPG                 6  mmHg                                              PVR                 1.3 Wood Units  PAPi                7.5       IMPRESSION: Low pre and post capillary filling pressures Normal cardiac index Normal PVR    ECHO: 08/09/23: LVEF 30-35%, RV okay, moderate MR 09/12/22: LVEF 30-35%, normal RV function as per my  interpretation 09/07/22: LVEF 35%, severe MR.  07/20/22: LVEF 45-50%, moderate MR.   CMR 12/19/22:  .  Severe LV dilatation with moderate systolic dysfunction (EF 31%)  2.  Normal RV size and systolic function (EF 54%)  3.  No late gadolinium enhancement to suggest myocardial scar  4.  Moderate mitral regurgitation (regurgitant fraction 24%)  CPX 02/20/23:  Exercise testing with gas exchange demonstrates a severely reduced peak VO2 of 17.6 ml/kg/min (54% of the age/gender/weight matched sedentary norms). The RER of 1.18 indicates a maximal effort. The VE/VCO2 slope is mildly elevated and indicates increased dead space ventilation. The oxygen uptake efficiency slope (OUES) is normal. The VO2 at the ventilatory threshold was normal at 44% of the predicted peak VO2. At peak exercise, the ventilation reached 51% of the measured MVV indicating ventilatory reserve remained. The O2pulse (a surrogate for stroke volume) increased with initial exercise, reaching early flattened and blunted peak at 7 ml/beat (70% predicted).  Moderate to severe HF limitation with chronotropic incompetence.   ASSESSMENT & PLAN:  Heart failure with reduced ejection fraction Etiology of HF: Nonischemic cardiomyopathy, likely peripartum; pregnancy complicated  pre-eclampsia requiring C-section.  NYHA class / AHA Stage:  NYHA III Volume status & Diuretics: lasix  20mg  as needed. Volume looks good today. BMET/BNP today Vasodilators: continue losartan  12.5mg  daily; limited by SBP.  Beta-Blocker: continue metoprolol  12.5mg  daily & digoxin  125mcg; becomes very symptomatic with increases. MRA:spironolactone  25 mg daily Cardiometabolic: developed urinary symptoms twice with Farixga, will not retrial Devices therapies & Valvulopathies: Has seen EP, hesitant to proceed with primary prevention ICD. Not keen on the idea of permanent device Advanced therapies: CPX with moderate to severe HF limitation with VE/VCO2 slope of 31, Peak VO2 of 17.6 (54% of predicted).  RHC 03/25 with low filling pressures and preserved CI  2.  Moderate mitral regurgitation -Previously severe, improving with GDMT.  -  Cardiac MRI in July 2024 with moderate MR (regurgitant fraction 24%) - Echo 02/25: EF 30-35%, moderate MR  3. Peripartum cardiomyopathy  - Has RX for SLYND  but has not started, followed by Dr. Erik - We discussed the importance of contraception and avoidance of future pregnancies today  4. High risk pregnancy  - [name] believes she is now 4-[redacted] weeks pregnant. She presents today for discussion regarding her future risks if she were to pursue full term. We had a very lengthy discussion involving her husband. We reviewed the CARPREG II score which at a minimum indicates a 10% risk of cardiac event with likely risk rate of 40% based on her prior history. I will discuss her case with OBGYN and reach out to her again next week.   I spent *** minutes caring for this patient today including face to face time, ordering and reviewing labs, reviewing records from ***, seeing the patient, documenting in the record, and arranging follow ups.

## 2024-02-21 ENCOUNTER — Encounter (HOSPITAL_COMMUNITY): Payer: Self-pay | Admitting: Cardiology

## 2024-02-21 ENCOUNTER — Ambulatory Visit (HOSPITAL_COMMUNITY)
Admission: RE | Admit: 2024-02-21 | Discharge: 2024-02-21 | Disposition: A | Discharge status: Home / Self Care | Source: Ambulatory Visit | Attending: Cardiology | Admitting: Cardiology

## 2024-02-21 ENCOUNTER — Other Ambulatory Visit (HOSPITAL_COMMUNITY): Payer: Self-pay

## 2024-02-21 VITALS — BP 118/84 | HR 90 | Ht 64.0 in | Wt 124.0 lb

## 2024-02-21 DIAGNOSIS — Z79899 Other long term (current) drug therapy: Secondary | ICD-10-CM | POA: Insufficient documentation

## 2024-02-21 DIAGNOSIS — Z3009 Encounter for other general counseling and advice on contraception: Secondary | ICD-10-CM

## 2024-02-21 DIAGNOSIS — I340 Nonrheumatic mitral (valve) insufficiency: Secondary | ICD-10-CM | POA: Insufficient documentation

## 2024-02-21 DIAGNOSIS — Z3A00 Weeks of gestation of pregnancy not specified: Secondary | ICD-10-CM

## 2024-02-21 DIAGNOSIS — I5020 Unspecified systolic (congestive) heart failure: Secondary | ICD-10-CM | POA: Insufficient documentation

## 2024-02-21 DIAGNOSIS — I428 Other cardiomyopathies: Secondary | ICD-10-CM | POA: Insufficient documentation

## 2024-02-21 DIAGNOSIS — O903 Peripartum cardiomyopathy: Secondary | ICD-10-CM | POA: Diagnosis not present

## 2024-02-21 LAB — BASIC METABOLIC PANEL WITH GFR
Anion gap: 10 (ref 5–15)
BUN: 11 mg/dL (ref 6–20)
CO2: 22 mmol/L (ref 22–32)
Calcium: 8.7 mg/dL — ABNORMAL LOW (ref 8.9–10.3)
Chloride: 106 mmol/L (ref 98–111)
Creatinine, Ser: 0.62 mg/dL (ref 0.44–1.00)
GFR, Estimated: >60 mL/min (ref >60)
Glucose, Bld: 94 mg/dL (ref 70–99)
Potassium: 4.2 mmol/L (ref 3.5–5.1)
Sodium: 138 mmol/L (ref 135–145)

## 2024-02-21 LAB — BRAIN NATRIURETIC PEPTIDE: B Natriuretic Peptide: 51.6 pg/mL (ref 0.0–100.0)

## 2024-02-21 LAB — DIGOXIN LEVEL: Digoxin Level: <0.6 ng/mL — ABNORMAL LOW (ref 0.8–2.0)

## 2024-02-21 MED ORDER — LOSARTAN POTASSIUM 25 MG PO TABS
25.0000 mg | ORAL_TABLET | Freq: Every day | ORAL | 5 refills | Status: AC
  Filled 2024-02-21: qty 30, 30d supply, fill #0

## 2024-02-21 NOTE — Telephone Encounter (Signed)
 Late entry  Paper work faxed to requested number last week.

## 2024-02-21 NOTE — Patient Instructions (Signed)
 Medication Changes:  INCREASE LOSARTAN  TO 25MG  ONCE DAILY   Lab Work:  Labs done today, your results will be available in MyChart, we will contact you for abnormal readings.  Testing/Procedures:  ECHOCARDIOGRAM AS SCHEDULED   Follow-Up in: 2 WEEKS AS SCHEDULED   THEN AGAIN IN 4-6 WEEKS AS SCHEDULED WITH DR. GARDENIA   At the [facility], you and your health needs are our priority. We have a designated team specialized in the treatment of Heart Failure. This Care Team includes your primary Heart Failure Specialized Cardiologist (physician), Advanced Practice Providers (APPs- Physician Assistants and Nurse Practitioners), and Pharmacist who all work together to provide you with the care you need, when you need it.   You may see any of the following providers on your designated Care Team at your next follow up:  Dr. Toribio Fuel Dr. Ezra Shuck Dr. Ria GARDENIA Dr. Odis Brownie Greig Mosses, NP Caffie Shed, [place] [facility] [place], [place] Beckey Coe, NP [place] Lee, NP Tinnie Redman, PharmD   Please be sure to bring in all your medications bottles to every appointment.   Need to Contact Us :  If you have any questions or concerns before your next appointment please send us  a message through [place] or call our office at [phone].    TO LEAVE A MESSAGE FOR THE NURSE SELECT OPTION 2, PLEASE LEAVE A MESSAGE INCLUDING: YOUR NAME DATE OF BIRTH CALL BACK NUMBER REASON FOR CALL**this is important as we prioritize the call backs  YOU WILL RECEIVE A CALL BACK THE SAME DAY AS LONG AS YOU CALL BEFORE 4:00 PM

## 2024-02-24 ENCOUNTER — Ambulatory Visit: Admitting: Obstetrics & Gynecology

## 2024-02-24 ENCOUNTER — Other Ambulatory Visit (HOSPITAL_COMMUNITY)
Admission: RE | Admit: 2024-02-24 | Discharge: 2024-02-24 | Disposition: A | Discharge status: Home / Self Care | Source: Ambulatory Visit | Attending: Obstetrics & Gynecology | Admitting: Obstetrics & Gynecology

## 2024-02-24 ENCOUNTER — Other Ambulatory Visit (HOSPITAL_COMMUNITY): Payer: Self-pay

## 2024-02-24 VITALS — BP 111/72 | HR 67 | Ht 64.0 in | Wt 124.0 lb

## 2024-02-24 DIAGNOSIS — Z1151 Encounter for screening for human papillomavirus (HPV): Secondary | ICD-10-CM

## 2024-02-24 DIAGNOSIS — Z3202 Encounter for pregnancy test, result negative: Secondary | ICD-10-CM

## 2024-02-24 DIAGNOSIS — Z113 Encounter for screening for infections with a predominantly sexual mode of transmission: Secondary | ICD-10-CM | POA: Diagnosis not present

## 2024-02-24 DIAGNOSIS — Z30430 Encounter for insertion of intrauterine contraceptive device: Secondary | ICD-10-CM | POA: Diagnosis not present

## 2024-02-24 DIAGNOSIS — Z01419 Encounter for gynecological examination (general) (routine) without abnormal findings: Secondary | ICD-10-CM | POA: Diagnosis not present

## 2024-02-24 LAB — POCT URINE PREGNANCY: Preg Test, Ur: NEGATIVE

## 2024-02-24 MED ORDER — MISOPROSTOL 200 MCG PO TABS
ORAL_TABLET | ORAL | 0 refills | Status: DC
  Filled 2024-02-24 – 2024-03-12 (×2): qty 2, 1d supply, fill #0

## 2024-02-24 NOTE — Progress Notes (Signed)
 Subjective:     [name] is a 38 y.o. female here for a routine exam.  Current complaints: recent uterine tenderness--feels like she is about to start her period--it's about time.  Had unprotected intercourse within the last 2 days.     Gynecologic History No LMP recorded. Contraception: none--wants Copper  T IUD Last Pap: 2023. 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 Last mammogram: never; no family history  Obstetric History OB History  Gravida Para Term Preterm AB Living  6 3 1 2 2 3   SAB IAB Ectopic Multiple Live Births  1 1  0 3    # Outcome Date GA Lbr Len/2nd Weight Sex Type Anes PTL Lv  6 Gravida           5 Preterm 09/11/22 [redacted]w[redacted]d  5 lb 2.2 oz (2.33 kg) F CS-LTranv Spinal  LIV     Complications: Hematoma, Preeclampsia  4 Preterm 02/02/15 [redacted]w[redacted]d  5 lb 8.9 oz (2.52 kg) M CS-LVertical Spinal  LIV     Complications: Bladder injury  3 SAB 2012          2 Term 2009    F CS-LTranv   LIV  1 IAB 2003             The following portions of the patient's history were reviewed and updated as appropriate: allergies, current medications, past family history, past medical history, past social history, past surgical history, and problem list.  Review of Systems Pertinent items noted in HPI and remainder of comprehensive ROS otherwise negative.    Objective:     Vitals:   02/24/24 1011  BP: 111/72  Pulse: 67  Weight: 124 lb (56.2 kg)  Height: 5' 4 (1.626 m)   Vitals:  WNL General appearance: alert, cooperative and no distress  HEENT: Normocephalic, without obvious abnormality, atraumatic Eyes: negative Throat: lips, mucosa, and tongue normal; teeth and gums normal  Respiratory: Clear to auscultation bilaterally  CV: Regular rate and rhythm  Breasts:  Normal appearance, no masses or tenderness, no nipple retraction or dimpling  GI: Soft, non-tender; bowel sounds normal; no masses,  no organomegaly  GU: External Genitalia:  Tanner V, no lesion Urethra:  No prolapse    Vagina: Pink, normal rugae, no blood or discharge  Cervix: No CMT, no lesion  Uterus:  Normal size and contour, mild tenderness on bimanual   Adnexa: Normal, no masses, non tender  Musculoskeletal: No edema, redness or tenderness in the calves or thighs  Skin: No lesions or rash  Lymphatic: Axillary adenopathy: none     Psychiatric: Normal mood and behavior        Assessment:    Healthy female exam.    Plan:    Pap with cotesting GC/Chlam testing  Unable to place IUD due to unprotected intercourse within the last few days.  UPT negative today. RTC 2 weeks after abstinence.  Will give cytotec  pre meds. Viscous lidocaine  per vagina at time of insertion of IUD.  Will re evaluate pelvic pain at next visit.

## 2024-02-25 ENCOUNTER — Ambulatory Visit: Payer: Self-pay | Admitting: Obstetrics & Gynecology

## 2024-02-25 LAB — CERVICOVAGINAL ANCILLARY ONLY
Chlamydia: NEGATIVE
Comment: NEGATIVE
Comment: NEGATIVE
Comment: NORMAL
Neisseria Gonorrhea: NEGATIVE
Trichomonas: NEGATIVE

## 2024-02-27 LAB — CYTOLOGY - PAP
Comment: NEGATIVE
Diagnosis: UNDETERMINED — AB
High risk HPV: NEGATIVE

## 2024-03-04 ENCOUNTER — Other Ambulatory Visit (HOSPITAL_COMMUNITY): Payer: Self-pay

## 2024-03-07 NOTE — Progress Notes (Incomplete)
 ***In Progress***    Advanced Heart Failure Clinic Note   Primary Care: Sebastian Beverley NOVAK, MD HF: Ria Commander, DO EP: Dr. 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    Her cardiac history dates back to November 2023 when she was admitted with pyelonephritis.  At that time she was noted to be tachycardic with inferolateral T wave inversions on ECG.  She had a follow-up echocardiogram demonstrating EF of 40%, apical lateral hypokinesis and moderate to severe mitral regurgitation.     Repeat echocardiogram in February 2024 with a EF of 45 to 50% and moderate MR.  In March 2024 she was admitted with severe preeclampsia and active labor.  She underwent C-section at that time with echocardiogram demonstrating EF of 30 to 35%.  Unfortunately once more readmitted in April 2024 for sepsis secondary to intra-abdominal abscess requiring IV antibiotics and IR drainage.     Since starting GDMT, repeat echos / CMR have demonstrated persistently reduced LVEF of 30-35%. She is now S/P ICD.    12/2023: Patient presented to clinic letting us  know that she was pregnant.  After very lengthy discussion with her and OB/GYN, we proceeded with decision to terminate due to extremely high risk for cardiovascular events.  Patient returned to AHF clinic on 02/21/24 for follow-up with Dr. Commander. She reported feeling the same as prior visits. She became limited very quickly on grocery trips; reported becoming dyspneic and fatigued after five minutes. Reported her lightheadedness had improved signficiantly. Planned to have IUD placed in the following week.   Today she returns to HF clinic for pharmacist medication titration. At last visit with MD, her losartan  was increased to losartan  25 mg. ***    Shortness of breath/dyspnea on exertion? {YES NO:22349}  Orthopnea/PND? {YES NO:22349} Edema? {YES NO:22349} Lightheadedness/dizziness? {YES NO:22349} Daily weights at home? {YES NO:22349} Blood pressure/heart rate monitoring at home? {YES [id number] Following low-sodium/fluid-restricted diet? {YES NO:22349}  HF Medications: Metoprolol  succinate 12.5 mg daily *** Losartan  25 mg daily *** Spironolactone  25 mg daily *** Furosemide  20 mg PRN *** Digoxin  0.125 mg daily ***  Has the patient been experiencing any side effects to the medications prescribed?  {YES NO:22349}  Does the patient have any problems obtaining medications due to transportation or finances?   {YES NO:22349}  Understanding of regimen: {excellent/good/fair/poor:19665} Understanding of indications: {excellent/good/fair/poor:19665} Potential of compliance: {excellent/good/fair/poor:19665} Patient understands to avoid NSAIDs. Patient understands to avoid decongestants.    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  NYHA class / AHA Stage: NYHA III Volume status & Diuretics: lasix  20mg  as needed.  Euvolemic on exam. ***  Repeat BMP/BNP today *** Vasodilators: *** Increase losartan  to 25mg  at bedtime; limited by SBP previously.  Will try to transition to Entresto or uptitrate losartan  to 50mg  at follow up.  *** Beta-Blocker: repeat dig level today; will attempt to increase toprol  to 25mg  in 2 weeks. *** MRA:spironolactone  25 mg daily *** Cardiometabolic:  developed  urinary symptoms twice with Farixga, will not retrial Devices therapies & Valvulopathies: Has seen EP, hesitant to proceed with primary prevention ICD. Not keen on the idea of permanent device *** Advanced therapies: CPX with moderate to severe HF limitation with VE/VCO2 slope of 31, Peak VO2 of 17.6 (54% of predicted). RHC 08/2023 with low filling pressures and preserved CI.  Trying to uptitrate GDMT at this time.  If LVEF remains severely reduced will refer to [facility] transplant so that she can establish a relationship with them.  By her most recent right heart catheterization she did not meet transplant criteria although CPX is suggestive of limitations 2/2 HF. ***   2.  Moderate mitral regurgitation - Previously severe, improving with GDMT.  - Cardiac MRI in July 2024 with moderate MR (regurgitant fraction 24%) - Echo 07/2023: EF 30-35%, moderate MR - Repeat TTE ordered.    3. Peripartum cardiomyopathy  - Has RX for SLYND  but has not started, followed by Dr. Erik - Now status post [facility] in July 2025.  Planning for IUD placement.  Discussed risks of pregnancy with her again today.  Follow up ***  Morna Breach, PharmD PGY2 Cardiology Pharmacy Resident

## 2024-03-09 ENCOUNTER — Inpatient Hospital Stay (HOSPITAL_COMMUNITY)
Admission: RE | Admit: 2024-03-09 | Discharge: 2024-03-09 | Disposition: A | Discharge status: Home / Self Care | Source: Ambulatory Visit | Attending: Cardiology

## 2024-03-09 VITALS — BP 120/74 | HR 72 | Wt 119.4 lb

## 2024-03-09 DIAGNOSIS — Z79899 Other long term (current) drug therapy: Secondary | ICD-10-CM | POA: Diagnosis not present

## 2024-03-09 DIAGNOSIS — O903 Peripartum cardiomyopathy: Secondary | ICD-10-CM | POA: Insufficient documentation

## 2024-03-09 DIAGNOSIS — I5020 Unspecified systolic (congestive) heart failure: Secondary | ICD-10-CM | POA: Diagnosis not present

## 2024-03-09 DIAGNOSIS — I5022 Chronic systolic (congestive) heart failure: Secondary | ICD-10-CM | POA: Diagnosis not present

## 2024-03-09 DIAGNOSIS — I340 Nonrheumatic mitral (valve) insufficiency: Secondary | ICD-10-CM | POA: Insufficient documentation

## 2024-03-09 MED ORDER — METOPROLOL SUCCINATE ER 25 MG PO TB24: 25.0000 mg | ORAL_TABLET | Freq: Every evening | ORAL | 3 refills | Status: AC

## 2024-03-09 NOTE — Patient Instructions (Addendum)
 It was a pleasure seeing you today!  MEDICATIONS: -We are changing your medications today -Increase metoprolol  succinate 25 mg (1 tablet) daily -Call if you have questions about your medications.  NEXT APPOINTMENT: Return to clinic on October 3rd at 10:00 AM for repeat ECHO Return to clinic in 1 month with Dr. Carola (October 27th at 9:20 AM)  In general, to take care of your heart failure: -Limit your fluid intake to 2 Liters (half-gallon) per day.   -Limit your salt intake to ideally 2-3 grams (2000-3000 mg) per day. -Weigh yourself daily and record, and bring that weight diary to your next appointment.  (Weight gain of 2-3 pounds in 1 day typically means fluid weight.) -The medications for your heart are to help your heart and help you live longer.   -Please contact us  before stopping any of your heart medications.  Call the clinic at [phone] with questions or to reschedule future appointments.

## 2024-03-09 NOTE — Progress Notes (Signed)
 Advanced Heart Failure Clinic Note   Primary Care: Sebastian Beverley NOVAK, MD HF: Ria Commander, DO EP: Dr. 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    Her cardiac history dates back to November 2023 when she was admitted with pyelonephritis.  At that time she was noted to be tachycardic with inferolateral T wave inversions on ECG.  She had a follow-up echocardiogram demonstrating EF of 40%, apical lateral hypokinesis and moderate to severe mitral regurgitation.     Repeat echocardiogram in February 2024 with a EF of 45 to 50% and moderate MR. In March 2024 she was admitted with severe preeclampsia and active labor.  She underwent C-section at that time with echocardiogram demonstrating EF of 30 to 35%.  Unfortunately once more readmitted in April 2024 for sepsis secondary to intra-abdominal abscess requiring IV antibiotics and IR drainage.    Since starting GDMT, repeat echos / CMR have demonstrated persistently reduced LVEF of 30-35%.   12/2023: Patient presented to clinic letting us  know that she was pregnant.  After very lengthy discussion with her and OB/GYN, we proceeded with decision to terminate due to extremely high risk for cardiovascular events.  Patient returned to AHF clinic on 02/21/24 for follow-up with Dr. Commander. She reported feeling the same as prior visits. She became limited very quickly on grocery trips; reported becoming dyspneic and fatigued after five minutes. Reported her lightheadedness had improved signficiantly. Planned to have IUD placed in the following week.   Today she returns to HF clinic for pharmacist medication titration. At last visit with MD, her losartan  was increased to 25 mg. She reports increase stress recently due to being out of work and busy  with her children. She reports one episode of sharp chest pain overnight that was accompanied with SOB, after discussion with patient was thought to be due to anxiety. Reports getting SOB quickly when walking from parking lot into grocery store; states that it is unchanged from prior visits. She reports intermittent episodes of lightheadedness and dizziness, but reports they do not limit her ADLs. Patient denies checking BP at home. No LEE or orthopnea noted. Noted weight decreased by ~4lbs since last visit. Patient reports last dose of furosemide  was two months ago. She reports adherence to all other medications. Reports following a low-sodium diet and avoids excessive fluid intake.  HF Medications: Metoprolol  succinate 12.5 mg daily  Losartan  25 mg daily  Spironolactone  25 mg daily  Furosemide  20 mg PRN  Digoxin  0.125 mg daily  Has the patient been experiencing any side effects to the medications prescribed?  None reported by the patient  Does the patient have any problems obtaining medications due to transportation or finances?   None reported, patient has [place] Medicaid  Understanding of regimen: good Understanding of indications: good Potential of compliance: good Patient understands to avoid NSAIDs. Patient understands to avoid decongestants.    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  NYHA class / AHA Stage: NYHA III Volume status &  Diuretics: Continue furosemide  20 mg as needed.  Euvolemic on exam. Vasodilators: Continue losartan  to 25mg  at bedtime; limited by SBP and  intermittent episodes of dizziness. Could consider transitioning to Entresto at a future appointment if symptoms of hypotension resolve.  Beta-Blocker: Increase metoprolol  succinate to 25 mg daily. Continue digoxin  0.125 mg daily, repeat digoxin  level on 02/21/24 was WNL. MRA: Continue spironolactone  25 mg daily  Cardiometabolic: developed urinary symptoms twice with Farixga, will not retrial Devices therapies & Valvulopathies: Has seen EP, hesitant to proceed with primary prevention ICD. Not keen on the idea of permanent device Advanced therapies: CPX with moderate to severe HF limitation with VE/VCO2 slope of 31, Peak VO2 of 17.6 (54% of predicted). RHC 08/2023 with low filling pressures and preserved CI.  Trying to uptitrate GDMT at this time.  If LVEF remains severely reduced will refer to [facility] transplant so that she can establish a relationship with them.  By her most recent right heart catheterization she did not meet transplant criteria although CPX is suggestive of limitations 2/2 HF.   2.  Moderate mitral regurgitation - Previously severe, improving with GDMT.  - Cardiac MRI in July 2024 with moderate MR (regurgitant fraction 24%) - Echo 07/2023: EF 30-35%, moderate MR - Repeat TTE ordered.    3. Peripartum cardiomyopathy  - Has RX for SLYND  but has not started, followed by Dr. Erik - Now status post [facility] in July 2025.  Planning for IUD placement. - Risks of pregnancy were discussed with the patient.  Follow up in 2 weeks for repeat ECHO Follow up in 1 month for visit with Dr. Gardenia Morna Breach, PharmD PGY2 Cardiology Pharmacy Resident  Tinnie Redman, PharmD, BCPS, [facility], CPP Heart Failure Clinic Pharmacist [phone]

## 2024-03-12 ENCOUNTER — Other Ambulatory Visit: Payer: Self-pay

## 2024-03-16 ENCOUNTER — Ambulatory Visit: Admitting: Obstetrics & Gynecology

## 2024-03-20 ENCOUNTER — Ambulatory Visit (HOSPITAL_COMMUNITY)
Admission: RE | Admit: 2024-03-20 | Discharge: 2024-03-20 | Disposition: A | Discharge status: Home / Self Care | Source: Ambulatory Visit | Attending: Internal Medicine | Admitting: Internal Medicine

## 2024-03-20 DIAGNOSIS — R0600 Dyspnea, unspecified: Secondary | ICD-10-CM | POA: Insufficient documentation

## 2024-03-20 DIAGNOSIS — I429 Cardiomyopathy, unspecified: Secondary | ICD-10-CM | POA: Diagnosis not present

## 2024-03-20 DIAGNOSIS — I340 Nonrheumatic mitral (valve) insufficiency: Secondary | ICD-10-CM | POA: Diagnosis not present

## 2024-03-20 DIAGNOSIS — I5020 Unspecified systolic (congestive) heart failure: Secondary | ICD-10-CM | POA: Diagnosis not present

## 2024-03-20 LAB — ECHOCARDIOGRAM COMPLETE
Area-P 1/2: 2.73 cm2
MV M vel: 4.57 m/s
MV Peak grad: 83.5 mmHg
S' Lateral: 4.8 cm
Single Plane A2C EF: 45 %

## 2024-03-20 NOTE — Progress Notes (Signed)
  Echocardiogram 2D Echocardiogram has been performed.  [name], RDCS 03/20/2024, 11:05 AM

## 2024-03-30 ENCOUNTER — Ambulatory Visit: Admitting: Obstetrics & Gynecology

## 2024-04-10 ENCOUNTER — Telehealth (HOSPITAL_COMMUNITY): Payer: Self-pay | Admitting: Cardiology

## 2024-04-10 NOTE — Telephone Encounter (Signed)
 Called to confirm/remind patient of their appointment at the Advanced Heart Failure Clinic on 04/10/24.   Appointment:   [] Confirmed  [x] Left mess   [] No answer/No voice mail  [] VM Full/unable to leave message  [] Phone not in service  Patient reminded to bring all medications and/or complete list.  Confirmed patient has transportation. Gave directions, instructed to utilize valet parking.

## 2024-04-10 NOTE — Telephone Encounter (Signed)
 Called to confirm/remind patient of their appointment at the Advanced Heart Failure Clinic on 04/10/24.   Appointment:   [x] Confirmed  [] Left mess   [] No answer/No voice mail  [] VM Full/unable to leave message  [] Phone not in service  Patient reminded to bring all medications and/or complete list.  Confirmed patient has transportation. Gave directions, instructed to utilize valet parking.

## 2024-04-13 ENCOUNTER — Encounter (HOSPITAL_COMMUNITY): Payer: Self-pay | Admitting: Cardiology

## 2024-04-13 ENCOUNTER — Ambulatory Visit (HOSPITAL_COMMUNITY)
Admission: RE | Admit: 2024-04-13 | Discharge: 2024-04-13 | Disposition: A | Discharge status: Home / Self Care | Source: Ambulatory Visit | Attending: Cardiology | Admitting: Cardiology

## 2024-04-13 ENCOUNTER — Ambulatory Visit: Admitting: Obstetrics and Gynecology

## 2024-04-13 ENCOUNTER — Ambulatory Visit (HOSPITAL_COMMUNITY): Payer: Self-pay | Admitting: Adult Health

## 2024-04-13 VITALS — BP 118/84 | HR 92 | Ht 64.0 in | Wt 119.2 lb

## 2024-04-13 VITALS — BP 125/85 | HR 82 | Ht 64.0 in | Wt 120.1 lb

## 2024-04-13 DIAGNOSIS — I428 Other cardiomyopathies: Secondary | ICD-10-CM | POA: Insufficient documentation

## 2024-04-13 DIAGNOSIS — Z30430 Encounter for insertion of intrauterine contraceptive device: Secondary | ICD-10-CM | POA: Diagnosis not present

## 2024-04-13 DIAGNOSIS — I4589 Other specified conduction disorders: Secondary | ICD-10-CM | POA: Diagnosis not present

## 2024-04-13 DIAGNOSIS — Z79899 Other long term (current) drug therapy: Secondary | ICD-10-CM | POA: Diagnosis not present

## 2024-04-13 DIAGNOSIS — R0602 Shortness of breath: Secondary | ICD-10-CM | POA: Diagnosis not present

## 2024-04-13 DIAGNOSIS — I517 Cardiomegaly: Secondary | ICD-10-CM | POA: Diagnosis not present

## 2024-04-13 DIAGNOSIS — I5020 Unspecified systolic (congestive) heart failure: Secondary | ICD-10-CM

## 2024-04-13 DIAGNOSIS — I340 Nonrheumatic mitral (valve) insufficiency: Secondary | ICD-10-CM

## 2024-04-13 DIAGNOSIS — R300 Dysuria: Secondary | ICD-10-CM | POA: Diagnosis not present

## 2024-04-13 DIAGNOSIS — Z95810 Presence of automatic (implantable) cardiac defibrillator: Secondary | ICD-10-CM | POA: Insufficient documentation

## 2024-04-13 DIAGNOSIS — I5022 Chronic systolic (congestive) heart failure: Secondary | ICD-10-CM | POA: Diagnosis not present

## 2024-04-13 DIAGNOSIS — O903 Peripartum cardiomyopathy: Secondary | ICD-10-CM | POA: Diagnosis not present

## 2024-04-13 DIAGNOSIS — O1490 Unspecified pre-eclampsia, unspecified trimester: Secondary | ICD-10-CM | POA: Insufficient documentation

## 2024-04-13 DIAGNOSIS — R42 Dizziness and giddiness: Secondary | ICD-10-CM | POA: Diagnosis not present

## 2024-04-13 DIAGNOSIS — I5189 Other ill-defined heart diseases: Secondary | ICD-10-CM | POA: Insufficient documentation

## 2024-04-13 DIAGNOSIS — R350 Frequency of micturition: Secondary | ICD-10-CM | POA: Diagnosis not present

## 2024-04-13 LAB — POCT URINALYSIS DIPSTICK
Bilirubin, UA: NEGATIVE
Glucose, UA: NEGATIVE
Ketones, UA: NEGATIVE
Nitrite, UA: POSITIVE
Protein, UA: POSITIVE — AB
Spec Grav, UA: >=1.03 — AB (ref 1.010–1.025)
Urobilinogen, UA: 1 U/dL
pH, UA: 6 (ref 5.0–8.0)

## 2024-04-13 LAB — BASIC METABOLIC PANEL WITH GFR
Anion gap: 11 (ref 5–15)
BUN: 14 mg/dL (ref 6–20)
CO2: 26 mmol/L (ref 22–32)
Calcium: 8.9 mg/dL (ref 8.9–10.3)
Chloride: 103 mmol/L (ref 98–111)
Creatinine, Ser: 0.68 mg/dL (ref 0.44–1.00)
GFR, Estimated: >60 mL/min (ref >60)
Glucose, Bld: 77 mg/dL (ref 70–99)
Potassium: 3.5 mmol/L (ref 3.5–5.1)
Sodium: 140 mmol/L (ref 135–145)

## 2024-04-13 LAB — POCT URINE PREGNANCY: Preg Test, Ur: NEGATIVE

## 2024-04-13 MED ORDER — PARAGARD INTRAUTERINE COPPER IU IUD
1.0000 | INTRAUTERINE_SYSTEM | Freq: Once | INTRAUTERINE | Status: AC
  Administered 2024-04-13: 1 via INTRAUTERINE

## 2024-04-13 MED ORDER — SULFAMETHOXAZOLE-TRIMETHOPRIM 800-160 MG PO TABS: 1.0000 | ORAL_TABLET | Freq: Two times a day (BID) | ORAL | 0 refills | Status: AC

## 2024-04-13 NOTE — Progress Notes (Signed)
    GYNECOLOGY OFFICE PROCEDURE NOTE  Desa Rech is a 38 y.o. [id number] here for IUD insertion. Urine dip & symptoms of UTI - bactrim  & culture sent.  Last pap smear was on 02/24/24 and was ASCUS/HPV neg  IUD Insertion Procedure Note Procedure: IUD insertion with Paragard  UPT: Negative GC/CT testing: Up to date  Patient identified.  Risks, benefits and alternatives of procedure were discussed including irregular bleeding, cramping, infection, malpositioning or misplacement of the IUD outside the uterus which may require further procedure such as laparoscopy. Also discussed >99% contraception efficacy, increased risk of ectopic pregnancy with failure of method.   Emphasized that this did not protect against STIs, condoms recommended during all sexual encounters. Consent signed. Time out performed.   6mL of 2% viscous lidocaine  was placed 5 minutes prior to procedure. Hot packs were provided during and post procedure.  Speculum inserted and cervix visualized, prepped with 3 swabs of betadine.  Grasped with a single tooth tenaculum. , Uterus sounded to 8 cm. , and IUD then inserted without difficulty per manufacturer's instructions and strings cut to 3 cm below cervical os and all instruments removed. Pt tolerated well with some pain and bleeding.   Discussed concerning signs/symptoms and to call if heavy bleeding, severe abdominal pain, or fever in the following 3 weeks. Manufacturer pamphlet/patient information given. Reviewed timing of efficacy for contraception and to use an alternative form of birth control until that time.  Kieth Carolin, MD Obstetrician & Gynecologist, [facility] for [employer], [facility]

## 2024-04-13 NOTE — Progress Notes (Signed)
 ADVANCED HEART FAILURE CLINIC NOTE  Primary Care: Sebastian Beverley NOVAK, MD HF: Ria Commander, DO EP: Dr. Inocencio  RR:Rymnwpr HFrEF/ 2/2 peripartum cardiomyopathy  HPI: [name] is a 38 y.o. 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    Her cardiac history dates back to November 2023 when she was admitted with pyelonephritis.  At that time she was noted to be tachycardic with inferolateral T wave inversions on ECG.  She had a follow-up echocardiogram demonstrating EF of 40%, apical lateral hypokinesis and moderate to severe mitral regurgitation.    Repeat echocardiogram in 2/24 with a EF of 45 to 50% and moderate MR.  In March 2024 she was admitted with severe preeclampsia and active labor.  She underwent C-section at that time with echocardiogram demonstrating EF of 30 to 35%.  Unfortunately once more readmitted in April 2024 for sepsis secondary to intra-abdominal abscess requiring IV antibiotics and IR drainage.    Since starting GDMT, repeat echos / CMR have demonstrated persistently reduced LVEF of 30-35%. She is now S/P ICD.   7/25: Patient presented to clinic letting us  know that she was pregnant.  After very lengthy discussion with her and OB/GYN, we proceeded with decision to terminate due to extremely high risk for cardiovascular events.  Echo 03/2024- LVEF 40-45% RV normal Grade IDD.   Today she returns for HF follow up.Overall feeling fine. Occasionally short of breath. Denies PND/Orthopnea. Complaining of dizziness. Appetite ok. No fever or chills. Weight at home stable. Taking all medications . She has been taking lasix  for the last  3-4 days. Plans for IUD today.   Current Outpatient Medications  Medication Sig Dispense Refill   digoxin  (LANOXIN ) 0.125 MG tablet Take 1 tablet (0.125 mg total) by mouth daily. 90 tablet 3   furosemide  (LASIX ) 20 MG tablet Take 1 tablet (20 mg total) by mouth  daily as needed for fluid or edema. 30 tablet 11   ibuprofen  (ADVIL ) 600 MG tablet Take 1 tablet (600 mg total) by mouth every 6 (six) hours as needed for headache, mild pain (pain score 1-3), moderate pain (pain score 4-6) or cramping. 30 tablet 2   losartan  (COZAAR ) 25 MG tablet Take 1 tablet (25 mg total) by mouth daily. 30 tablet 5   metoprolol  succinate (TOPROL  XL) 25 MG 24 hr tablet Take 1 tablet (25 mg total) by mouth at bedtime. 90 tablet 3   spironolactone  (ALDACTONE ) 25 MG tablet Take 1 tablet (25 mg total) by mouth daily. 90 tablet 3   docusate sodium  (COLACE) 100 MG capsule Take 1 capsule (100 mg total) by mouth 2 (two) times daily as needed for mild constipation or moderate constipation. (Patient not taking: Reported on 04/13/2024) 30 capsule 2   Drospirenone  (SLYND ) 4 MG TABS Take 1 tablet (4 mg total) by mouth daily. (Patient not taking: Reported on 04/13/2024) 90 tablet 3   meclizine  (MEDI-MECLIZINE ) 25 MG tablet Take 1/2 tablet to 1 tablet by mouth every 6 to 12 hours as needed (Patient not taking: Reported on 04/13/2024) 30 tablet 0   misoprostol  (CYTOTEC ) 200 MCG tablet Take two tablets 2 hours before procedure. (Patient not taking: Reported on 04/13/2024) 2 tablet 0   No current facility-administered medications for this encounter.    PHYSICAL EXAM: Vitals:   04/13/24 0928  BP: 118/84  Pulse: 92  SpO2: 99%   This SmartLink has not been configured with any valid records.    Wt Readings from  Last 3 Encounters:  04/13/24 54.1 kg (119 lb 3.2 oz)  03/09/24 54.2 kg (119 lb 6.4 oz)  02/24/24 56.2 kg (124 lb)    General:   No resp difficulty Neck: no JVD.  Cor: Regular rate & rhythm.  Lungs: clear Abdomen: soft, nontender, nondistended.  Extremities: no  edema Neuro: alert & oriented x3   DATA REVIEW  RHC 03/25: HEMODYNAMICS: RA:                  2 mmHg (mean) RV:                  23/2 mmHg PA:                  20/5 mmHg (11 mean) PCWP:            5 mmHg  (mean)                                      Estimated Fick CO/CI   4.65 L/min, 2.9 L/min/m2 Thermodilution CO/CI   4.2 L/min, 2.6 L/min/m2                                              TPG                 6  mmHg                                              PVR                 1.3 Wood Units  PAPi                7.5       IMPRESSION: Low pre and post capillary filling pressures Normal cardiac index Normal PVR    ECHO: 08/09/23: LVEF 30-35%, RV okay, moderate MR 09/12/22: LVEF 30-35%, normal RV function as per my  interpretation 09/07/22: LVEF 35%, severe MR.  07/20/22: LVEF 45-50%, moderate MR.   CMR 12/19/22:  .  Severe LV dilatation with moderate systolic dysfunction (EF 31%)  2.  Normal RV size and systolic function (EF 54%)  3.  No late gadolinium enhancement to suggest myocardial scar  4.  Moderate mitral regurgitation (regurgitant fraction 24%)  CPX 02/20/23:  Exercise testing with gas exchange demonstrates a severely reduced peak VO2 of 17.6 ml/kg/min (54% of the age/gender/weight matched sedentary norms). The RER of 1.18 indicates a maximal effort. The VE/VCO2 slope is mildly elevated and indicates increased dead space ventilation. The oxygen uptake efficiency slope (OUES) is normal. The VO2 at the ventilatory threshold was normal at 44% of the predicted peak VO2. At peak exercise, the ventilation reached 51% of the measured MVV indicating ventilatory reserve remained. The O2pulse (a surrogate for stroke volume) increased with initial exercise, reaching early flattened and blunted peak at 7 ml/beat (70% predicted).  Moderate to severe HF limitation with chronotropic incompetence.   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  NYHA class / AHA Stage: NYHA II Volume status & Diuretics: Appears euvolemic.  Continue lasix  20mg  as needed.  Suspect  dizziness due to lasix  that she took for the last 3-4 days. Check BMET   Vasodilators: Continue losartan  to 25mg  at bedtime Beta-Blocker: Continue toprol  25mg  daily.  MRA:spironolactone  25 mg daily Cardiometabolic: developed urinary symptoms twice with Farixga, will not retrial Devices therapies & Valvulopathies:  Advanced therapies: CPX with moderate to severe HF limitation with VE/VCO2 slope of 31, Peak VO2 of 17.6 (54% of predicted). RHC 03/25 with low filling pressures and preserved CI.  Trying to uptitrate GDMT at this time.   Echo in October EF improving 40-45%.Out of range for ICD.  Stop digoxin .   2.  Moderate mitral regurgitation -Previously severe, improving with GDMT.  -  Cardiac MRI in July 2024 with moderate MR (regurgitant fraction 24%) - Echo 03/2024 EF 40-45% mild-mod-MR  3. Peripartum cardiomyopathy  - Has RX for SLYND  but has not started, followed by Dr. Erik - Now status post [facility] in July 2025.  Planning for IUD placement today.  -Discussed risks of pregnancy with her again today.  Provided with letter to remain out of work until next visit.  Follow up in 2 months.    Tawney Vanorman NP-C 9:41 AM

## 2024-04-13 NOTE — Patient Instructions (Signed)
 Medication Changes:  STOP Digoxin   Lab Work:  Labs done today, your results will be available in MyChart, we will contact you for abnormal readings.  Special Instructions // Education:  Do the following things EVERYDAY: Weigh yourself in the morning before breakfast. Write it down and keep it in a log. Take your medicines as prescribed Eat low salt foods--Limit salt (sodium) to 2000 mg per day.  Stay as active as you can everyday Limit all fluids for the day to less than 2 liters  We have provided you with a note to stay out of work until your next appointment  Follow-Up in: 4-6 weeks   At the Advanced Heart Failure Clinic, you and your health needs are our priority. We have a designated team specialized in the treatment of Heart Failure. This Care Team includes your primary Heart Failure Specialized Cardiologist (physician), Advanced Practice Providers (APPs- Physician Assistants and Nurse Practitioners), and Pharmacist who all work together to provide you with the care you need, when you need it.   You may see any of the following providers on your designated Care Team at your next follow up:  Dr. Toribio Fuel Dr. Ezra Shuck Dr. Ria Commander Dr. Odis Brownie Greig Mosses, NP Caffie Shed, [place] [facility] [place], [place] Beckey Coe, NP Jordan Lee, NP Tinnie Redman, PharmD   Please be sure to bring in all your medications bottles to every appointment.   Need to Contact Us :  If you have any questions or concerns before your next appointment please send us  a message through [place] or call our office at [phone].    TO LEAVE A MESSAGE FOR THE NURSE SELECT OPTION 2, PLEASE LEAVE A MESSAGE INCLUDING: YOUR NAME DATE OF BIRTH CALL BACK NUMBER REASON FOR CALL**this is important as we prioritize the call backs  YOU WILL RECEIVE A CALL BACK THE SAME DAY AS LONG AS YOU CALL BEFORE 4:00 PM

## 2024-04-16 ENCOUNTER — Ambulatory Visit: Payer: Self-pay | Admitting: Obstetrics and Gynecology

## 2024-04-16 LAB — URINE CULTURE

## 2024-04-22 ENCOUNTER — Telehealth: Payer: Self-pay | Admitting: *Deleted

## 2024-04-22 NOTE — Telephone Encounter (Signed)
Left patient a message to call and schedule. 

## 2024-04-29 ENCOUNTER — Ambulatory Visit

## 2024-04-29 VITALS — BP 121/81 | HR 68 | Ht 64.0 in | Wt 122.0 lb

## 2024-04-29 DIAGNOSIS — R350 Frequency of micturition: Secondary | ICD-10-CM

## 2024-04-29 DIAGNOSIS — R300 Dysuria: Secondary | ICD-10-CM | POA: Diagnosis not present

## 2024-04-29 LAB — POCT URINALYSIS DIPSTICK
Bilirubin, UA: NEGATIVE
Blood, UA: NEGATIVE
Glucose, UA: NEGATIVE
Ketones, UA: NEGATIVE
Nitrite, UA: POSITIVE
Protein, UA: NEGATIVE
Spec Grav, UA: 1.025 (ref 1.010–1.025)
Urobilinogen, UA: 0.2 U/dL
pH, UA: 6 (ref 5.0–8.0)

## 2024-04-29 MED ORDER — CEFADROXIL 500 MG PO CAPS: 500.0000 mg | ORAL_CAPSULE | Freq: Two times a day (BID) | ORAL | 0 refills | Status: DC

## 2024-04-29 NOTE — Progress Notes (Signed)
 SUBJECTIVE: [name] is a 38 y.o. female who complains of urinary frequency, urgency and dysuria x 3 days, without flank pain, fever, chills, or abnormal vaginal discharge or bleeding.   OBJECTIVE: Appears well, in no apparent distress.  Vital signs are normal. Urine dipstick shows positive for WBC's and positive for nitrates.    ASSESSMENT: Dysuria  PLAN: Treatment per verbal orders for [facility], Call or return to clinic prn if these symptoms worsen or fail to improve as anticipated. Urine culture sent.  Silvano LELON Piano, RN

## 2024-05-03 LAB — URINE CULTURE

## 2024-05-04 ENCOUNTER — Ambulatory Visit: Payer: Self-pay | Admitting: Obstetrics and Gynecology

## 2024-05-04 DIAGNOSIS — N3000 Acute cystitis without hematuria: Secondary | ICD-10-CM

## 2024-05-04 MED ORDER — NITROFURANTOIN MONOHYD MACRO 100 MG PO CAPS: 100.0000 mg | ORAL_CAPSULE | Freq: Two times a day (BID) | ORAL | 0 refills | Status: AC

## 2024-05-08 ENCOUNTER — Telehealth (HOSPITAL_COMMUNITY): Payer: Self-pay | Admitting: Cardiology

## 2024-05-08 NOTE — Telephone Encounter (Signed)
 Called to confirm/remind patient of their appointment at the [facility] on 05/08/2024.   Appointment:   [x] Confirmed  [] Left mess   [] No answer/No voice mail  [] VM Full/unable to leave message  [] Phone not in service  Patient reminded to bring all medications and/or complete list.  Confirmed patient has transportation. Gave directions, instructed to utilize valet parking.

## 2024-05-11 ENCOUNTER — Ambulatory Visit (HOSPITAL_COMMUNITY)
Admission: RE | Admit: 2024-05-11 | Discharge: 2024-05-11 | Disposition: A | Discharge status: Home / Self Care | Source: Ambulatory Visit | Attending: Cardiology | Admitting: Cardiology

## 2024-05-11 ENCOUNTER — Telehealth (HOSPITAL_COMMUNITY): Payer: Self-pay

## 2024-05-11 ENCOUNTER — Encounter (HOSPITAL_COMMUNITY): Payer: Self-pay | Admitting: Cardiology

## 2024-05-11 VITALS — BP 112/78 | HR 73 | Wt 122.8 lb

## 2024-05-11 DIAGNOSIS — I340 Nonrheumatic mitral (valve) insufficiency: Secondary | ICD-10-CM | POA: Diagnosis not present

## 2024-05-11 DIAGNOSIS — Z3A00 Weeks of gestation of pregnancy not specified: Secondary | ICD-10-CM

## 2024-05-11 DIAGNOSIS — I5022 Chronic systolic (congestive) heart failure: Secondary | ICD-10-CM | POA: Insufficient documentation

## 2024-05-11 DIAGNOSIS — Z975 Presence of (intrauterine) contraceptive device: Secondary | ICD-10-CM | POA: Diagnosis not present

## 2024-05-11 DIAGNOSIS — I5020 Unspecified systolic (congestive) heart failure: Secondary | ICD-10-CM | POA: Diagnosis not present

## 2024-05-11 DIAGNOSIS — Z95810 Presence of automatic (implantable) cardiac defibrillator: Secondary | ICD-10-CM | POA: Insufficient documentation

## 2024-05-11 DIAGNOSIS — O903 Peripartum cardiomyopathy: Secondary | ICD-10-CM | POA: Insufficient documentation

## 2024-05-11 NOTE — Progress Notes (Signed)
 ADVANCED HE[facility] FAILURE FOLLOW UP CLINIC NOTE  Referring Physician: Sebastian Beverley NOVAK, MD  Primary Care: Patient, No Pcp Per Primary Cardiologist:  HPI: [name] is a 38 y.o. female who presents for follow up of chronic systolic heart failure.      Her cardiac history dates back to November 2023 when she was admitted with pyelonephritis.  At that time she was noted to be tachycardic with inferolateral T wave inversions on ECG.  She had a follow-up echocardiogram demonstrating EF of 40%, apical lateral hypokinesis and moderate to severe mitral regurgitation.     Repeat echocardiogram in 2/24 with a EF of 45 to 50% and moderate MR.  In March 2024 she was admitted with severe preeclampsia and active labor.  She underwent C-section at that time with echocardiogram demonstrating EF of 30 to 35%.  Unfortunately once more readmitted in April 2024 for sepsis secondary to intra-abdominal abscess requiring IV antibiotics and IR drainage.     Since starting GDMT, repeat echos / CMR have demonstrated persistently reduced LVEF of 30-35%. She is now S/P ICD.      SUBJECTIVE:  Patient overall reports feeling fair.  She stays busy taking care of her 3 children, does not feel that she has the ability go to back to work.  She does have some occasional shortness of breath with more than moderate exertion.  She takes her Lasix  occasionally for symptoms of bloating with some improvement following.  Had a copper  IUD placed recently.  PMH, current medications, allergies, social history, and family history reviewed in epic.  PHYSICAL EXAM: Vitals:   05/11/24 0909  BP: 112/78  Pulse: 73  SpO2: 100%   GENERAL: Well nourished and in no apparent distress at rest.  PULM:  Normal work of breathing, clear to auscultation bilaterally. Respirations are unlabored.  CARDIAC:  JVP: Flat         Normal rate with regular rhythm. No murmurs, rubs or gallops.  No edema. Warm and well perfused  extremities. ABDOMEN: Soft, non-tender, non-distended. NEUROLOGIC: Patient is oriented x3 with no focal or lateralizing neurologic deficits.    DATA REVIEW  ECG: 10/2023: NSR, LVH    ECHO: 03/2024: LVEF 40-45%, mildly dilated, normal RV 08/09/23: LVEF 30-35%, RV okay, moderate MR 09/12/22: LVEF 30-35%, normal RV function as per my  interpretation 09/07/22: LVEF 35%, severe MR.  07/20/22: LVEF 45-50%, moderate MR.   CATH: 08/2023: RHC: RA 2, PA 20/5 (11), PCWP 5, TD CO/CI 4.2/2.6   CMR 12/19/22:  .  Severe LV dilatation with moderate systolic dysfunction (EF 31%)  2.  Normal RV size and systolic function (EF 54%)  3.  No late gadolinium enhancement to suggest myocardial scar  4.  Moderate mitral regurgitation (regurgitant fraction 24%)    ASSESSMENT & PLAN:  Chronic systolic heart failure: Nonischemic peripartum cardiomyopathy, ejection fraction has improved somewhat but still with significant limitations as evident by prior CPX testing.  Remains out of work due to her heart failure limitations and labor-intensive job.  Tolerating low-dose goal-directed medical therapy. - Stable NYHA class II, euvolemic - Continue losartan  25 mg daily, metoprolol  25 mg nightly - Continue spironolactone  25 mg daily - Did not tolerate SGLT2 due to UTI - Continue Lasix  20 mg as needed - Discussed cardiac rehab, active at home with 3 children - Can repeat echo in 1 year  Moderate MR: Functional, mild to moderate on most recent exam. - Continue to monitor  Peripartum cardiomyopathy: Prior D&C in July 2025  for high risk pregnancy.  - Now with ParaGard  IUD - Discussed risk of further pregnancies  Follow up in 4 months  Morene Brownie, MD Advanced [facility] Failure Mechanical Circulatory Support 05/12/24

## 2024-05-11 NOTE — Telephone Encounter (Signed)
 Forms sent today 05/11/24.  Patient was made aware during office visit that forms faxed.

## 2024-05-11 NOTE — Patient Instructions (Addendum)
 There has been no changes to your medications.  Your physician recommends that you schedule a follow-up appointment in: 4 months.  If you have any questions or concerns before your next appointment please send us  a message through [place] or call our office at [phone].    TO LEAVE A MESSAGE FOR THE NURSE SELECT OPTION 2, PLEASE LEAVE A MESSAGE INCLUDING: YOUR NAME DATE OF BIRTH CALL BACK NUMBER REASON FOR CALL**this is important as we prioritize the call backs  YOU WILL RECEIVE A CALL BACK THE SAME DAY AS LONG AS YOU CALL BEFORE 4:00 PM  At the [facility], you and your health needs are our priority. As part of our continuing mission to provide you with exceptional heart care, we have created designated Provider Care Teams. These Care Teams include your primary Cardiologist (physician) and Advanced Practice Providers (APPs- Physician Assistants and Nurse Practitioners) who all work together to provide you with the care you need, when you need it.   You may see any of the following providers on your designated Care Team at your next follow up: Dr Toribio Fuel Dr Ezra Shuck Dr. Morene Brownie Greig Mosses, NP Caffie Shed, [place] [facility] [place], [place] Beckey Coe, NP Jordan Lee, NP Ellouise Class, NP Tinnie Redman, PharmD Jaun Bash, PharmD   Please be sure to bring in all your medications bottles to every appointment.    Thank you for choosing [facility]

## 2024-05-25 ENCOUNTER — Encounter (HOSPITAL_COMMUNITY): Payer: Self-pay

## 2024-05-26 ENCOUNTER — Encounter (HOSPITAL_COMMUNITY): Payer: Self-pay

## 2024-09-08 ENCOUNTER — Ambulatory Visit (HOSPITAL_COMMUNITY)
